# Patient Record
Sex: Male | Born: 1945
Health system: Southern US, Community
[De-identification: ages and names within clinical notes are randomized; demographics above are authoritative.]

## PROBLEM LIST (undated history)

## (undated) DIAGNOSIS — J3089 Other allergic rhinitis: Secondary | ICD-10-CM

## (undated) DIAGNOSIS — K219 Gastro-esophageal reflux disease without esophagitis: Secondary | ICD-10-CM

## (undated) DIAGNOSIS — C44519 Basal cell carcinoma of skin of other part of trunk: Secondary | ICD-10-CM

## (undated) DIAGNOSIS — N400 Enlarged prostate without lower urinary tract symptoms: Secondary | ICD-10-CM

## (undated) DIAGNOSIS — I2510 Atherosclerotic heart disease of native coronary artery without angina pectoris: Secondary | ICD-10-CM

## (undated) DIAGNOSIS — J45909 Unspecified asthma, uncomplicated: Secondary | ICD-10-CM

## (undated) DIAGNOSIS — Z87898 Personal history of other specified conditions: Secondary | ICD-10-CM

## (undated) DIAGNOSIS — E785 Hyperlipidemia, unspecified: Secondary | ICD-10-CM

## (undated) DIAGNOSIS — N5201 Erectile dysfunction due to arterial insufficiency: Secondary | ICD-10-CM

## (undated) DIAGNOSIS — M1990 Unspecified osteoarthritis, unspecified site: Secondary | ICD-10-CM

## (undated) DIAGNOSIS — I10 Essential (primary) hypertension: Secondary | ICD-10-CM

## (undated) DIAGNOSIS — E119 Type 2 diabetes mellitus without complications: Secondary | ICD-10-CM

## (undated) DIAGNOSIS — D131 Benign neoplasm of stomach: Secondary | ICD-10-CM

## (undated) DIAGNOSIS — I639 Cerebral infarction, unspecified: Secondary | ICD-10-CM

## (undated) DIAGNOSIS — K602 Anal fissure, unspecified: Secondary | ICD-10-CM

## (undated) DIAGNOSIS — C4490 Unspecified malignant neoplasm of skin, unspecified: Secondary | ICD-10-CM

## (undated) DIAGNOSIS — N486 Induration penis plastica: Secondary | ICD-10-CM

## (undated) DIAGNOSIS — D126 Benign neoplasm of colon, unspecified: Secondary | ICD-10-CM

## (undated) DIAGNOSIS — T7840XA Allergy, unspecified, initial encounter: Secondary | ICD-10-CM

## (undated) DIAGNOSIS — R351 Nocturia: Secondary | ICD-10-CM

## (undated) DIAGNOSIS — Z87442 Personal history of urinary calculi: Secondary | ICD-10-CM

## (undated) DIAGNOSIS — H8110 Benign paroxysmal vertigo, unspecified ear: Secondary | ICD-10-CM

## (undated) DIAGNOSIS — K5790 Diverticulosis of intestine, part unspecified, without perforation or abscess without bleeding: Secondary | ICD-10-CM

## (undated) HISTORY — DX: Unspecified asthma, uncomplicated: J45.909

## (undated) HISTORY — DX: Induration penis plastica: N48.6

## (undated) HISTORY — DX: Benign prostatic hyperplasia without lower urinary tract symptoms: N40.0

## (undated) HISTORY — DX: Other allergic rhinitis: J30.89

## (undated) HISTORY — PX: VASECTOMY: SHX75

## (undated) HISTORY — DX: Benign neoplasm of stomach: D13.1

## (undated) HISTORY — DX: Erectile dysfunction due to arterial insufficiency: N52.01

## (undated) HISTORY — DX: Diverticulosis of intestine, part unspecified, without perforation or abscess without bleeding: K57.90

## (undated) HISTORY — PX: HERNIA REPAIR: SHX51

## (undated) HISTORY — PX: TRIGGER FINGER RELEASE: SHX641

## (undated) HISTORY — DX: Allergy, unspecified, initial encounter: T78.40XA

## (undated) HISTORY — DX: Benign neoplasm of colon, unspecified: D12.6

## (undated) HISTORY — DX: Cerebral infarction, unspecified: I63.9

## (undated) HISTORY — DX: Hyperlipidemia, unspecified: E78.5

## (undated) HISTORY — DX: Nocturia: R35.1

## (undated) HISTORY — PX: OTHER SURGICAL HISTORY: SHX169

## (undated) HISTORY — DX: Basal cell carcinoma of skin of other part of trunk: C44.519

## (undated) HISTORY — PX: TONSILLECTOMY AND ADENOIDECTOMY: SUR1326

## (undated) HISTORY — DX: Personal history of other specified conditions: Z87.898

## (undated) HISTORY — DX: Anal fissure, unspecified: K60.2

## (undated) HISTORY — DX: Gastro-esophageal reflux disease without esophagitis: K21.9

## (undated) HISTORY — DX: Benign paroxysmal vertigo, unspecified ear: H81.10

## (undated) HISTORY — DX: Type 2 diabetes mellitus without complications: E11.9

---

## 1997-07-06 ENCOUNTER — Encounter: Admission: RE | Admit: 1997-07-06 | Discharge: 1997-10-04 | Payer: Self-pay | Admitting: Specialist

## 2001-02-05 ENCOUNTER — Emergency Department (HOSPITAL_COMMUNITY): Admission: EM | Admit: 2001-02-05 | Discharge: 2001-02-05 | Payer: Self-pay | Admitting: Emergency Medicine

## 2001-02-05 ENCOUNTER — Encounter: Payer: Self-pay | Admitting: *Deleted

## 2001-11-04 ENCOUNTER — Encounter: Payer: Self-pay | Admitting: Gastroenterology

## 2002-08-25 ENCOUNTER — Encounter: Payer: Self-pay | Admitting: Pediatrics

## 2002-08-25 ENCOUNTER — Encounter: Admission: RE | Admit: 2002-08-25 | Discharge: 2002-08-25 | Payer: Self-pay | Admitting: Pediatrics

## 2003-12-08 ENCOUNTER — Encounter: Payer: Self-pay | Admitting: Internal Medicine

## 2004-05-17 ENCOUNTER — Ambulatory Visit: Payer: Self-pay | Admitting: Internal Medicine

## 2004-05-27 ENCOUNTER — Encounter: Admission: RE | Admit: 2004-05-27 | Discharge: 2004-07-23 | Payer: Self-pay | Admitting: Internal Medicine

## 2004-09-09 ENCOUNTER — Ambulatory Visit: Payer: Self-pay | Admitting: Internal Medicine

## 2005-03-21 ENCOUNTER — Ambulatory Visit: Payer: Self-pay | Admitting: Internal Medicine

## 2005-04-18 ENCOUNTER — Ambulatory Visit: Payer: Self-pay | Admitting: Internal Medicine

## 2006-05-14 ENCOUNTER — Ambulatory Visit: Payer: Self-pay | Admitting: Internal Medicine

## 2006-05-14 DIAGNOSIS — Z87442 Personal history of urinary calculi: Secondary | ICD-10-CM

## 2006-05-15 LAB — CONVERTED CEMR LAB
ALT: 33 units/L (ref 0–40)
AST: 28 units/L (ref 0–37)
Albumin: 3.9 g/dL (ref 3.5–5.2)
Alkaline Phosphatase: 70 units/L (ref 39–117)
BUN: 16 mg/dL (ref 6–23)
Basophils Absolute: 0.1 10*3/uL (ref 0.0–0.1)
Basophils Relative: 0.9 % (ref 0.0–1.0)
Bilirubin Urine: NEGATIVE
Bilirubin, Direct: 0.2 mg/dL (ref 0.0–0.3)
CO2: 29 meq/L (ref 19–32)
Calcium: 10.1 mg/dL (ref 8.4–10.5)
Chloride: 100 meq/L (ref 96–112)
Cholesterol: 208 mg/dL (ref 0–200)
Creatinine, Ser: 1.2 mg/dL (ref 0.4–1.5)
Creatinine,U: 135.1 mg/dL
Direct LDL: 133.2 mg/dL
Eosinophils Absolute: 0.1 10*3/uL (ref 0.0–0.6)
Eosinophils Relative: 2 % (ref 0.0–5.0)
Free T4: 0.7 ng/dL (ref 0.6–1.6)
GFR calc Af Amer: 80 mL/min
GFR calc non Af Amer: 66 mL/min
Glucose, Bld: 102 mg/dL — ABNORMAL HIGH (ref 70–99)
HCT: 45.8 % (ref 39.0–52.0)
HDL: 32.5 mg/dL — ABNORMAL LOW (ref >39.0)
Hemoglobin, Urine: NEGATIVE
Hemoglobin: 16.1 g/dL (ref 13.0–17.0)
Hgb A1c MFr Bld: 5.5 % (ref 4.6–6.0)
Ketones, ur: NEGATIVE mg/dL
Leukocytes, UA: NEGATIVE
Lymphocytes Relative: 25.7 % (ref 12.0–46.0)
MCHC: 35.1 g/dL (ref 30.0–36.0)
MCV: 88.7 fL (ref 78.0–100.0)
Microalb Creat Ratio: 4.4 mg/g (ref 0.0–30.0)
Microalb, Ur: 0.6 mg/dL (ref 0.0–1.9)
Monocytes Absolute: 0.6 10*3/uL (ref 0.2–0.7)
Monocytes Relative: 7.9 % (ref 3.0–11.0)
Neutro Abs: 4.5 10*3/uL (ref 1.4–7.7)
Neutrophils Relative %: 63.5 % (ref 43.0–77.0)
Nitrite: NEGATIVE
PSA: 1.46 ng/mL (ref 0.10–4.00)
Platelets: 172 10*3/uL (ref 150–400)
Potassium: 4.6 meq/L (ref 3.5–5.1)
RBC: 5.17 M/uL (ref 4.22–5.81)
RDW: 12.1 % (ref 11.5–14.6)
Sodium: 145 meq/L (ref 135–145)
Specific Gravity, Urine: 1.015 (ref 1.000–1.03)
TSH: 3.65 microintl units/mL (ref 0.35–5.50)
Total Bilirubin: 1 mg/dL (ref 0.3–1.2)
Total CHOL/HDL Ratio: 6.4
Total Protein, Urine: NEGATIVE mg/dL
Total Protein: 6.3 g/dL (ref 6.0–8.3)
Triglycerides: 256 mg/dL (ref 0–149)
Urine Glucose: NEGATIVE mg/dL
Urobilinogen, UA: 0.2 (ref 0.0–1.0)
VLDL: 51 mg/dL — ABNORMAL HIGH (ref 0–40)
WBC: 7.1 10*3/uL (ref 4.5–10.5)
pH: 7 (ref 5.0–8.0)

## 2006-05-20 ENCOUNTER — Ambulatory Visit: Payer: Self-pay | Admitting: Internal Medicine

## 2006-06-10 ENCOUNTER — Ambulatory Visit: Payer: Self-pay | Admitting: *Deleted

## 2006-09-04 ENCOUNTER — Encounter: Payer: Self-pay | Admitting: Internal Medicine

## 2006-10-29 ENCOUNTER — Encounter: Payer: Self-pay | Admitting: Internal Medicine

## 2006-11-27 ENCOUNTER — Ambulatory Visit: Payer: Self-pay | Admitting: Internal Medicine

## 2006-12-07 LAB — CONVERTED CEMR LAB
Cholesterol: 199 mg/dL (ref 0–200)
HDL: 27.6 mg/dL — ABNORMAL LOW (ref >39.0)
Hgb A1c MFr Bld: 5.8 % (ref 4.6–6.0)
LDL Cholesterol: 138 mg/dL — ABNORMAL HIGH (ref 0–99)
Total CHOL/HDL Ratio: 7.2
Triglycerides: 169 mg/dL — ABNORMAL HIGH (ref 0–149)
VLDL: 34 mg/dL (ref 0–40)

## 2006-12-08 ENCOUNTER — Encounter (INDEPENDENT_AMBULATORY_CARE_PROVIDER_SITE_OTHER): Payer: Self-pay | Admitting: *Deleted

## 2006-12-11 ENCOUNTER — Ambulatory Visit: Payer: Self-pay | Admitting: Internal Medicine

## 2006-12-11 LAB — CONVERTED CEMR LAB
Cholesterol, target level: 200 mg/dL
HDL goal, serum: 40 mg/dL
LDL Goal: 130 mg/dL

## 2007-01-22 ENCOUNTER — Telehealth: Payer: Self-pay | Admitting: Internal Medicine

## 2007-02-19 ENCOUNTER — Ambulatory Visit: Payer: Self-pay | Admitting: Internal Medicine

## 2007-02-26 ENCOUNTER — Encounter (INDEPENDENT_AMBULATORY_CARE_PROVIDER_SITE_OTHER): Payer: Self-pay | Admitting: *Deleted

## 2007-02-26 LAB — CONVERTED CEMR LAB
ALT: 22 units/L (ref 0–53)
AST: 22 units/L (ref 0–37)
Cholesterol: 181 mg/dL (ref 0–200)
HDL: 30.2 mg/dL — ABNORMAL LOW (ref >39.0)
Hgb A1c MFr Bld: 5.5 % (ref 4.6–6.0)
LDL Cholesterol: 121 mg/dL — ABNORMAL HIGH (ref 0–99)
Total CHOL/HDL Ratio: 6
Triglycerides: 150 mg/dL — ABNORMAL HIGH (ref 0–149)
VLDL: 30 mg/dL (ref 0–40)

## 2007-03-01 ENCOUNTER — Ambulatory Visit: Payer: Self-pay | Admitting: Internal Medicine

## 2007-03-01 DIAGNOSIS — IMO0002 Reserved for concepts with insufficient information to code with codable children: Secondary | ICD-10-CM

## 2007-03-02 ENCOUNTER — Encounter (INDEPENDENT_AMBULATORY_CARE_PROVIDER_SITE_OTHER): Payer: Self-pay | Admitting: *Deleted

## 2007-03-02 LAB — CONVERTED CEMR LAB: Total CK: 181 units/L (ref 7–195)

## 2007-06-02 ENCOUNTER — Ambulatory Visit: Payer: Self-pay | Admitting: Internal Medicine

## 2007-06-08 ENCOUNTER — Encounter (INDEPENDENT_AMBULATORY_CARE_PROVIDER_SITE_OTHER): Payer: Self-pay | Admitting: *Deleted

## 2007-06-08 LAB — CONVERTED CEMR LAB
AST: 27 units/L (ref 0–37)
Bilirubin, Direct: 0.2 mg/dL (ref 0.0–0.3)
Cholesterol: 131 mg/dL (ref 0–200)
HDL: 29.8 mg/dL — ABNORMAL LOW (ref >39.0)
Total Bilirubin: 1 mg/dL (ref 0.3–1.2)
Total CHOL/HDL Ratio: 4.4
Total CK: 172 units/L (ref 7–195)
Total Protein: 6.2 g/dL (ref 6.0–8.3)
Triglycerides: 114 mg/dL (ref 0–149)

## 2007-06-16 ENCOUNTER — Ambulatory Visit: Payer: Self-pay | Admitting: Internal Medicine

## 2007-06-16 DIAGNOSIS — J45909 Unspecified asthma, uncomplicated: Secondary | ICD-10-CM

## 2007-06-16 DIAGNOSIS — I10 Essential (primary) hypertension: Secondary | ICD-10-CM

## 2007-06-16 DIAGNOSIS — E785 Hyperlipidemia, unspecified: Secondary | ICD-10-CM

## 2007-06-16 HISTORY — DX: Unspecified asthma, uncomplicated: J45.909

## 2007-07-27 ENCOUNTER — Encounter: Payer: Self-pay | Admitting: Internal Medicine

## 2007-08-03 ENCOUNTER — Ambulatory Visit: Payer: Self-pay | Admitting: Internal Medicine

## 2007-08-03 DIAGNOSIS — T887XXA Unspecified adverse effect of drug or medicament, initial encounter: Secondary | ICD-10-CM | POA: Insufficient documentation

## 2007-10-07 ENCOUNTER — Encounter: Payer: Self-pay | Admitting: Internal Medicine

## 2007-10-20 ENCOUNTER — Encounter: Payer: Self-pay | Admitting: Internal Medicine

## 2007-10-25 ENCOUNTER — Encounter (INDEPENDENT_AMBULATORY_CARE_PROVIDER_SITE_OTHER): Payer: Self-pay | Admitting: *Deleted

## 2007-10-28 ENCOUNTER — Encounter: Payer: Self-pay | Admitting: Internal Medicine

## 2007-11-10 ENCOUNTER — Encounter: Payer: Self-pay | Admitting: Internal Medicine

## 2007-11-26 ENCOUNTER — Ambulatory Visit: Payer: Self-pay | Admitting: Internal Medicine

## 2007-12-03 ENCOUNTER — Encounter (INDEPENDENT_AMBULATORY_CARE_PROVIDER_SITE_OTHER): Payer: Self-pay | Admitting: *Deleted

## 2007-12-03 LAB — CONVERTED CEMR LAB
ALT: 28 units/L (ref 0–53)
AST: 25 units/L (ref 0–37)
Albumin: 4.3 g/dL (ref 3.5–5.2)
BUN: 14 mg/dL (ref 6–23)
Basophils Relative: 0.7 % (ref 0.0–3.0)
CO2: 26 meq/L (ref 19–32)
Calcium: 9.1 mg/dL (ref 8.4–10.5)
Chloride: 108 meq/L (ref 96–112)
Cholesterol: 153 mg/dL (ref 0–200)
Creatinine, Ser: 1.1 mg/dL (ref 0.4–1.5)
Eosinophils Absolute: 0.2 10*3/uL (ref 0.0–0.7)
Eosinophils Relative: 2.8 % (ref 0.0–5.0)
GFR calc non Af Amer: 73 mL/min
HCT: 45.1 % (ref 39.0–52.0)
Hemoglobin: 15.7 g/dL (ref 13.0–17.0)
Hgb A1c MFr Bld: 5.7 % (ref 4.6–6.0)
MCHC: 34.9 g/dL (ref 30.0–36.0)
MCV: 90.6 fL (ref 78.0–100.0)
Neutro Abs: 3.5 10*3/uL (ref 1.4–7.7)
Neutrophils Relative %: 60.2 % (ref 43.0–77.0)
PSA: 1.45 ng/mL (ref 0.10–4.00)
RBC: 4.98 M/uL (ref 4.22–5.81)
VLDL: 31 mg/dL (ref 0–40)
WBC: 5.8 10*3/uL (ref 4.5–10.5)

## 2007-12-16 ENCOUNTER — Encounter: Payer: Self-pay | Admitting: Internal Medicine

## 2007-12-28 ENCOUNTER — Ambulatory Visit: Payer: Self-pay | Admitting: Internal Medicine

## 2007-12-28 DIAGNOSIS — Z85828 Personal history of other malignant neoplasm of skin: Secondary | ICD-10-CM

## 2007-12-30 ENCOUNTER — Encounter: Payer: Self-pay | Admitting: Internal Medicine

## 2008-04-07 DIAGNOSIS — D131 Benign neoplasm of stomach: Secondary | ICD-10-CM

## 2008-04-07 DIAGNOSIS — D126 Benign neoplasm of colon, unspecified: Secondary | ICD-10-CM

## 2008-04-07 HISTORY — DX: Benign neoplasm of colon, unspecified: D12.6

## 2008-04-07 HISTORY — DX: Benign neoplasm of stomach: D13.1

## 2008-06-19 ENCOUNTER — Encounter: Payer: Self-pay | Admitting: Internal Medicine

## 2008-08-25 ENCOUNTER — Encounter: Payer: Self-pay | Admitting: Internal Medicine

## 2008-08-29 ENCOUNTER — Encounter: Payer: Self-pay | Admitting: Internal Medicine

## 2008-09-26 ENCOUNTER — Telehealth (INDEPENDENT_AMBULATORY_CARE_PROVIDER_SITE_OTHER): Payer: Self-pay | Admitting: *Deleted

## 2008-10-06 ENCOUNTER — Encounter: Payer: Self-pay | Admitting: Internal Medicine

## 2008-12-20 ENCOUNTER — Telehealth (INDEPENDENT_AMBULATORY_CARE_PROVIDER_SITE_OTHER): Payer: Self-pay | Admitting: *Deleted

## 2008-12-22 ENCOUNTER — Ambulatory Visit: Payer: Self-pay | Admitting: Internal Medicine

## 2008-12-22 LAB — CONVERTED CEMR LAB
Albumin: 4.3 g/dL (ref 3.5–5.2)
BUN: 16 mg/dL (ref 6–23)
Basophils Relative: 0.4 % (ref 0.0–3.0)
CO2: 32 meq/L (ref 19–32)
Chloride: 110 meq/L (ref 96–112)
Creatinine, Ser: 1.1 mg/dL (ref 0.4–1.5)
Eosinophils Relative: 1.8 % (ref 0.0–5.0)
HCT: 46.4 % (ref 39.0–52.0)
HDL: 33.6 mg/dL — ABNORMAL LOW (ref >39.00)
Hemoglobin: 15.9 g/dL (ref 13.0–17.0)
Hgb A1c MFr Bld: 5.7 % (ref 4.6–6.5)
LDL Cholesterol: 86 mg/dL (ref 0–99)
Lymphs Abs: 1.8 10*3/uL (ref 0.7–4.0)
Monocytes Relative: 8 % (ref 3.0–12.0)
Neutro Abs: 5.4 10*3/uL (ref 1.4–7.7)
RBC: 5.04 M/uL (ref 4.22–5.81)
TSH: 2.21 microintl units/mL (ref 0.35–5.50)
Total CHOL/HDL Ratio: 4
Triglycerides: 109 mg/dL (ref 0.0–149.0)
WBC: 7.9 10*3/uL (ref 4.5–10.5)

## 2008-12-28 ENCOUNTER — Ambulatory Visit: Payer: Self-pay | Admitting: Internal Medicine

## 2008-12-29 ENCOUNTER — Telehealth: Payer: Self-pay | Admitting: Gastroenterology

## 2009-01-01 ENCOUNTER — Encounter: Payer: Self-pay | Admitting: Internal Medicine

## 2009-01-01 ENCOUNTER — Ambulatory Visit: Payer: Self-pay | Admitting: Gastroenterology

## 2009-01-01 ENCOUNTER — Ambulatory Visit: Payer: Self-pay | Admitting: Family Medicine

## 2009-01-01 DIAGNOSIS — K219 Gastro-esophageal reflux disease without esophagitis: Secondary | ICD-10-CM

## 2009-01-12 ENCOUNTER — Ambulatory Visit: Payer: Self-pay | Admitting: Gastroenterology

## 2009-01-12 ENCOUNTER — Encounter: Payer: Self-pay | Admitting: Gastroenterology

## 2009-01-17 ENCOUNTER — Encounter: Payer: Self-pay | Admitting: Gastroenterology

## 2009-01-19 ENCOUNTER — Encounter (INDEPENDENT_AMBULATORY_CARE_PROVIDER_SITE_OTHER): Payer: Self-pay | Admitting: *Deleted

## 2009-01-26 ENCOUNTER — Telehealth (INDEPENDENT_AMBULATORY_CARE_PROVIDER_SITE_OTHER): Payer: Self-pay | Admitting: *Deleted

## 2009-01-29 ENCOUNTER — Ambulatory Visit: Payer: Self-pay | Admitting: Internal Medicine

## 2009-01-29 ENCOUNTER — Telehealth (INDEPENDENT_AMBULATORY_CARE_PROVIDER_SITE_OTHER): Payer: Self-pay | Admitting: *Deleted

## 2009-01-31 ENCOUNTER — Ambulatory Visit: Payer: Self-pay | Admitting: Internal Medicine

## 2009-02-05 ENCOUNTER — Encounter (INDEPENDENT_AMBULATORY_CARE_PROVIDER_SITE_OTHER): Payer: Self-pay | Admitting: *Deleted

## 2009-02-05 LAB — CONVERTED CEMR LAB
Mumps IgG: 3.88 — ABNORMAL HIGH
Rubeola IgG: 1.94 — ABNORMAL HIGH

## 2009-03-05 ENCOUNTER — Ambulatory Visit: Payer: Self-pay | Admitting: Internal Medicine

## 2009-03-09 ENCOUNTER — Encounter: Payer: Self-pay | Admitting: Internal Medicine

## 2009-04-07 HISTORY — PX: UPPER GASTROINTESTINAL ENDOSCOPY: SHX188

## 2009-06-07 ENCOUNTER — Ambulatory Visit: Payer: Self-pay | Admitting: Internal Medicine

## 2009-10-05 ENCOUNTER — Encounter: Payer: Self-pay | Admitting: Internal Medicine

## 2010-01-25 ENCOUNTER — Encounter: Payer: Self-pay | Admitting: Internal Medicine

## 2010-03-26 ENCOUNTER — Ambulatory Visit: Payer: Self-pay | Admitting: Internal Medicine

## 2010-03-26 LAB — CONVERTED CEMR LAB
Blood in Urine, dipstick: NEGATIVE
Glucose, Urine, Semiquant: NEGATIVE
Ketones, urine, test strip: NEGATIVE
Protein, U semiquant: NEGATIVE
WBC Urine, dipstick: NEGATIVE
pH: 7.5

## 2010-04-01 LAB — CONVERTED CEMR LAB
ALT: 26 units/L (ref 0–53)
Albumin: 4.1 g/dL (ref 3.5–5.2)
Basophils Relative: 0.7 % (ref 0.0–3.0)
CO2: 28 meq/L (ref 19–32)
Chloride: 106 meq/L (ref 96–112)
Eosinophils Relative: 2 % (ref 0.0–5.0)
Glucose, Bld: 99 mg/dL (ref 70–99)
HDL: 34.7 mg/dL — ABNORMAL LOW (ref >39.00)
Lymphocytes Relative: 24.9 % (ref 12.0–46.0)
MCV: 91 fL (ref 78.0–100.0)
Monocytes Relative: 8.8 % (ref 3.0–12.0)
Neutrophils Relative %: 63.6 % (ref 43.0–77.0)
Platelets: 156 10*3/uL (ref 150.0–400.0)
Potassium: 5 meq/L (ref 3.5–5.1)
RBC: 4.84 M/uL (ref 4.22–5.81)
Sodium: 141 meq/L (ref 135–145)
TSH: 2.33 microintl units/mL (ref 0.35–5.50)
Total Bilirubin: 1 mg/dL (ref 0.3–1.2)
Total Protein: 6.4 g/dL (ref 6.0–8.3)
Triglycerides: 130 mg/dL (ref 0.0–149.0)
VLDL: 26 mg/dL (ref 0.0–40.0)
WBC: 6.8 10*3/uL (ref 4.5–10.5)

## 2010-04-26 ENCOUNTER — Encounter: Payer: Self-pay | Admitting: Internal Medicine

## 2010-04-26 ENCOUNTER — Ambulatory Visit
Admission: RE | Admit: 2010-04-26 | Discharge: 2010-04-26 | Payer: Self-pay | Source: Home / Self Care | Attending: Internal Medicine | Admitting: Internal Medicine

## 2010-05-09 NOTE — Letter (Signed)
Summary: [facility]  [facility]   Imported By: Lanelle Bal 10/15/2009 09:10:57  _____________________________________________________________________  External Attachment:    Type:   Image     Comment:   External Document

## 2010-05-09 NOTE — Assessment & Plan Note (Signed)
Summary: BOILS UNDER ARMS AND IN LOWER AREA//[facility]   Vital Signs:  Patient profile:   65 year old male Weight:      216.6 pounds Temp:     98.8 degrees F oral Resp:     16 per minute BP sitting:   128 / 86  (left arm) Cuff size:   small  Vitals Entered By: Shonna Chock (June 07, 2009 10:30 AM) CC: Patient had boils that have now disappeared, patient would like to discuss Comments REVIEWED MED LIST, PATIENT AGREED DOSE AND INSTRUCTION CORRECT    Primary Care Provider:  Marga Melnick, MD  CC:  Patient had boils that have now disappeared and patient would like to discuss.  History of Present Illness: "Boils " in axillae & @ rectal area intermittently over past 6 months, most recently 12 days ago.Described as tender  "pink & hard... purple head".Rx: Safeguard soap;Bactroban with complete resolution.Oral steroids necessary intermittently  for extrinsic respiratory issues from Dr Beaulah Dinning. No PMH of MRSA.Last A1c 5.7% in 12/2008; on no diabetic meds.  Allergies: 1)  ! Asa  Review of Systems General:  Denies chills, fever, and sweats. Derm:  Denies changes in color of skin, lesion(s), poor wound healing, and rash; No active lesions now. Endo:  Denies excessive hunger, excessive thirst, and excessive urination.  Physical Exam  General:  well-nourished,in no acute distress; alert,appropriate and cooperative throughout examination Skin:  Intact without suspicious lesions or rashes; no active infectious process Cervical Nodes:  No lymphadenopathy noted Axillary Nodes:  No palpable lymphadenopathy Psych:  memory intact for recent and remote, normally interactive, and good eye contact.     Impression & Recommendations:  Problem # 1:  CELLULITIS/ABSCESS, SITE NEC (GEX-528.8) recurrent ; R/O unmasking of DM by oral steroids with uncomplicated cellulitic infections, responsive to topical therapy The following medications were removed from the medication list:    Amoxicillin 500 Mg Caps  (Amoxicillin) .Marland Kitchen... 1 three times a day  Complete Medication List: 1)  Nexium 40 Mg Cpdr (Esomeprazole magnesium) .Marland Kitchen.. 1 by mouth qd 2)  Nasonex 50 Mcg/act Susp (Mometasone furoate) .... Prn 3)  Singulair 10 Mg Tabs (Montelukast sodium) .Marland Kitchen.. 1 by mouth qd 4)  Fexofenadine Hcl 180 Mg Tabs (Fexofenadine hcl) .Marland Kitchen.. 1 by mouth once daily prn 5)  Proventil Hfa 108 (90 Base) Mcg/act Aers (Albuterol sulfate) .Marland Kitchen.. 1-2 sprays q4 hours prn 6)  Qvar 80 Mcg/act Aers (Beclomethasone dipropionate) .... Prn 7)  Simvastatin 40 Mg Tabs (Simvastatin) .Marland Kitchen.. 1 by mouth qhs 8)  Multivitamins Tabs (Multiple vitamin) .Marland Kitchen.. 1 by mouth once daily  Patient Instructions: 1)   Draw an A1c if steroids restarted(Code: 790.29). Avoid Neosporin or Triple Antibiotic Ointment; use Bactroban  once daily as needed.

## 2010-05-09 NOTE — Letter (Signed)
Summary: [facility]   Imported By: Lanelle Bal 02/05/2010 09:10:40  _____________________________________________________________________  External Attachment:    Type:   Image     Comment:   External Document

## 2010-05-09 NOTE — Assessment & Plan Note (Signed)
Summary: CPX,LABS PRIOR/RH......Marland Kitchen   Vital Signs:  Patient profile:   65 year old male Height:      70 inches Weight:      213.6 pounds BMI:     30.76 Temp:     98.4 degrees F oral Pulse rate:   58 / minute Resp:     16 per minute BP sitting:   163 / 105  (left arm) Cuff size:   large  Vitals Entered By: Shonna Chock CMA (April 26, 2010 1:11 PM) CC: CPX and discuss labs , Heartburn, COPD follow-up   Primary Care Provider:  Marga Melnick, MD  CC:  CPX and discuss labs , Heartburn, and COPD follow-up.  History of Present Illness:    George Alvarez is here for a physical; he has been evaluated for gall baldder dysfunction in 01/2010  by a General Surgeon.     His ERD is controlled with Nexium.   He  denies acid reflux, sour taste in mouth, epigastric pain, chest pain, trouble swallowing, weight loss, and weight gain.  The patient denies the following alarm features: melena, dysphagia, hematemesis, and vomiting.  Symptoms are worse with spicy foods.      Hyperlipidemia Follow-Up: The patient denies muscle aches, flushing, itching, constipation, diarrhea, and fatigue.  The patient denies the following symptoms: exercise intolerance, dypsnea, palpitations, syncope, and pedal edema.  Compliance with medications (by patient report) has been near 100%.  Dietary compliance has been fair.  The patient reports exercising occasionally.  Adjunctive measures currently used by the patient include fiber and fish oil supplements.       RAD / EIB :  The patient denies shortness of breath, chest tightness, wheezing, cough, increased sputum, heat intolerance, and nocturnal awakening.  The patient reports no limitation in activities.  Medication use includes quick relief med weekl , mainly pre exercise  and controller med daily(QVAR). He  was seen @  Dr Beaulah Dinning' office  today.  Current Medications (verified): 1)  Nexium 40 Mg  Cpdr (Esomeprazole Magnesium) .Marland Kitchen.. 1 By Mouth Qd 2)  Nasonex 50 Mcg/act  Susp  (Mometasone Furoate) .... Prn 3)  Singulair 10 Mg  Tabs (Montelukast Sodium) .Marland Kitchen.. 1 By Mouth Qd 4)  Fexofenadine Hcl 180 Mg  Tabs (Fexofenadine Hcl) .Marland Kitchen.. 1 By Mouth Once Daily Prn 5)  Proventil Hfa 108 (90 Base) Mcg/act  Aers (Albuterol Sulfate) .Marland Kitchen.. 1-2 Sprays Q4 Hours Prn 6)  Qvar 80 Mcg/act  Aers (Beclomethasone Dipropionate) .... Prn 7)  Simvastatin 40 Mg  Tabs (Simvastatin) .Marland Kitchen.. 1 By Mouth At Bedtime 8)  Multivitamins  Tabs (Multiple Vitamin) .Marland Kitchen.. 1 By Mouth Once Daily  Allergies: 1)  ! Jonne Ply  Past History:  Past Medical History: LUMBOSACRAL RADICULOPATHY (ICD-724.4)  secondary  to Spinal Stenosis HYPERLIPIDEMIA  (ICD-272.4) NEPHROLITHIASIS, PMH  OF , X 1(ICD-V13.01), Dr Annabell Howells ASTHMA (ICD-493.90)/ EIB , Dr Beaulah Dinning Testicular cysts Skin cancer, PMH  of, basal cell & squamous cell, Dr Remonia Richter in [place], [place] of ; Trigger Finger, Dr Stanford [place], [place]; Peyronie's Disease , Dr Annabell Howells Diverticulosis GERD  Past Surgical History: Septoplasty Inguinal herniorrhaphy Vasectomy Tonsillectomy Trigger Finger injection 2010; Knee injection for torn meniscus 2010 Colonoscopy 2003  & 2011 negative  ; Epidural steroids cervical spine X2 in  2007; LS area X2 , last 2009  Family History: Father: MI @ 55  Mother: CAD, breast  cancer , Barrett's Esophagus, esophageal cancer , S/P radical surgery Siblings: none ; MGM :CAD, CVA, DM ; PGF: CAD; P uncl:  MI @ 30; M aunt: breast  cancer No FH of Colon Cancer:  Social History: Occupation: Environmental health practitioner Never Smoked Alcohol use-yes: socially  Daily Caffeine Use: 3 cups / day  Review of Systems  The patient denies anorexia, fever, vision loss, decreased hearing, hoarseness, prolonged cough, hemoptysis, hematuria, suspicious skin lesions, depression, unusual weight change, abnormal bleeding, enlarged lymph nodes, and angioedema.    Physical Exam  General:  well-nourished;alert,appropriate and cooperative throughout  examination Head:  Normocephalic and atraumatic without obvious abnormalities. Pattern  alopecia ; moustache & beard Eyes:  No corneal or conjunctival inflammation noted. Perrla. Funduscopic exam benign, without hemorrhages, exudates or papilledema.  Ears:  External ear exam shows no significant lesions or deformities.  Otoscopic examination reveals clear canals, tympanic membranes are intact bilaterally without bulging, retraction, inflammation or discharge. Hearing is grossly normal bilaterally. Nose:  External nasal examination shows no deformity or inflammation. Nasal mucosa are pink and moist without lesions or exudates. Mouth:  Oral mucosa and oropharynx without lesions or exudates.  Teeth in good repair. No pharyngeal erythema.   Neck:  No deformities, masses, or tenderness noted. Lungs:  Normal respiratory effort, chest expands symmetrically. Lungs are clear to auscultation, no crackles or wheezes. Heart:  Normal rate and regular rhythm. S1 and S2 normal without gallop, murmur, click, rub .S4 with slurring Abdomen:  Bowel sounds positive,abdomen soft and non-tender without masses, organomegaly or hernias noted. Genitalia:  Dr Annabell Howells Msk:  No deformity or scoliosis noted of thoracic or lumbar spine.   Pulses:  R and L carotid,radial,dorsalis pedis and posterior tibial pulses are full and equal bilaterally Extremities:  No clubbing, cyanosis, edema, or deformity noted with normal full range of motion of all joints.   Neurologic:  alert & oriented X3 and DTRs symmetrical and normal.   Skin:  Intact without suspicious lesions or rashes Cervical Nodes:  No lymphadenopathy noted Axillary Nodes:  No palpable lymphadenopathy Psych:  memory intact for recent and remote, normally interactive, and good eye contact.     Impression & Recommendations:  Problem # 1:  ROUTINE GENERAL MEDICAL EXAM@HEALTH  CARE FACL (ICD-V70.0)  Orders: EKG w/ Interpretation (93000)  Problem # 2:  GERD  (ICD-530.81)  His updated medication list for this problem includes:    Nexium 40 Mg Cpdr (Esomeprazole magnesium) .Marland Kitchen... 1 by mouth qd  Problem # 3:  HYPERTENSION, ESSENTIAL NOS (ICD-401.9)  Problem # 4:  HYPERLIPIDEMIA (ICD-272.4)  His updated medication list for this problem includes:    Simvastatin 40 Mg Tabs (Simvastatin) .Marland Kitchen... 1 by mouth at bedtime  Problem # 5:  ASTHMA (ICD-493.90)  His updated medication list for this problem includes:    Singulair 10 Mg Tabs (Montelukast sodium) .Marland Kitchen... 1 by mouth qd    Proventil Hfa 108 (90 Base) Mcg/act Aers (Albuterol sulfate) .Marland Kitchen... 1-2 sprays q4 hours prn    Qvar 80 Mcg/act Aers (Beclomethasone dipropionate) .Marland Kitchen... Prn  Complete Medication List: 1)  Nexium 40 Mg Cpdr (Esomeprazole magnesium) .Marland Kitchen.. 1 by mouth qd 2)  Nasonex 50 Mcg/act Susp (Mometasone furoate) .... Prn 3)  Singulair 10 Mg Tabs (Montelukast sodium) .Marland Kitchen.. 1 by mouth qd 4)  Fexofenadine Hcl 180 Mg Tabs (Fexofenadine hcl) .Marland Kitchen.. 1 by mouth once daily prn 5)  Proventil Hfa 108 (90 Base) Mcg/act Aers (Albuterol sulfate) .Marland Kitchen.. 1-2 sprays q4 hours prn 6)  Qvar 80 Mcg/act Aers (Beclomethasone dipropionate) .... Prn 7)  Simvastatin 40 Mg Tabs (Simvastatin) .Marland Kitchen.. 1 by mouth at bedtime 8)  Multivitamins Tabs (Multiple  vitamin) .Marland Kitchen.. 1 by mouth once daily  Patient Instructions: 1)  If Prilosec OTC controls ERD , change to Omeprazole 20 mg pre b'fast in place of Nexium. 2)  Avoid foods high in acid (tomatoes, citrus juices, spicy foods). Avoid eating within two hours of lying down or before exercising. Do not over eat; try smaller more frequent meals. Elevate head of bed twelve inches when sleeping.   Orders Added: 1)  Est. Patient 40-64 years [99396] 2)  EKG w/ Interpretation [93000]

## 2010-05-15 NOTE — Letter (Signed)
Summary: Cancer Screening/[facility] Risk Profile  Cancer Screening/[facility] Risk Profile   Imported By: Lanelle Bal 05/07/2010 08:59:22  _____________________________________________________________________  External Attachment:    Type:   Image     Comment:   External Document

## 2010-09-21 ENCOUNTER — Other Ambulatory Visit: Payer: Self-pay | Admitting: Internal Medicine

## 2011-01-31 ENCOUNTER — Other Ambulatory Visit: Payer: Self-pay | Admitting: Internal Medicine

## 2011-01-31 MED ORDER — SIMVASTATIN 40 MG PO TABS: 40.0000 mg | ORAL_TABLET | Freq: Every day | ORAL | Status: DC

## 2011-01-31 NOTE — Telephone Encounter (Signed)
Rx sent 

## 2011-05-09 ENCOUNTER — Ambulatory Visit (INDEPENDENT_AMBULATORY_CARE_PROVIDER_SITE_OTHER): Payer: BC Managed Care – PPO | Admitting: Internal Medicine

## 2011-05-09 ENCOUNTER — Encounter: Payer: Self-pay | Admitting: Internal Medicine

## 2011-05-09 VITALS — BP 130/88 | HR 71 | Temp 98.7°F | Resp 12 | Ht 70.0 in | Wt 220.0 lb

## 2011-05-09 DIAGNOSIS — Z0000 Encounter for general adult medical examination without abnormal findings: Secondary | ICD-10-CM

## 2011-05-09 DIAGNOSIS — E785 Hyperlipidemia, unspecified: Secondary | ICD-10-CM

## 2011-05-09 LAB — CBC WITH DIFFERENTIAL/PLATELET
Basophils Absolute: 0 10*3/uL (ref 0.0–0.1)
Basophils Relative: 0.5 % (ref 0.0–3.0)
Eosinophils Absolute: 0.1 10*3/uL (ref 0.0–0.7)
HCT: 46.3 % (ref 39.0–52.0)
Hemoglobin: 15.9 g/dL (ref 13.0–17.0)
Lymphocytes Relative: 27.5 % (ref 12.0–46.0)
Lymphs Abs: 1.8 10*3/uL (ref 0.7–4.0)
MCHC: 34.3 g/dL (ref 30.0–36.0)
MCV: 90 fl (ref 78.0–100.0)
Neutro Abs: 4.1 10*3/uL (ref 1.4–7.7)
RBC: 5.15 Mil/uL (ref 4.22–5.81)
RDW: 12.7 % (ref 11.5–14.6)

## 2011-05-09 LAB — BASIC METABOLIC PANEL
CO2: 27 mEq/L (ref 19–32)
Calcium: 9.2 mg/dL (ref 8.4–10.5)
Chloride: 103 mEq/L (ref 96–112)
Glucose, Bld: 101 mg/dL — ABNORMAL HIGH (ref 70–99)
Potassium: 4.4 mEq/L (ref 3.5–5.1)
Sodium: 138 mEq/L (ref 135–145)

## 2011-05-09 LAB — LIPID PANEL
Cholesterol: 134 mg/dL (ref 0–200)
HDL: 38.6 mg/dL — ABNORMAL LOW (ref >39.00)

## 2011-05-09 LAB — HEPATIC FUNCTION PANEL
Albumin: 4.5 g/dL (ref 3.5–5.2)
Alkaline Phosphatase: 63 U/L (ref 39–117)
Total Protein: 7 g/dL (ref 6.0–8.3)

## 2011-05-09 LAB — TSH: TSH: 1.83 u[IU]/mL (ref 0.35–5.50)

## 2011-05-09 LAB — HEMOGLOBIN A1C: Hgb A1c MFr Bld: 5.8 % (ref 4.6–6.5)

## 2011-05-09 NOTE — Patient Instructions (Signed)
Preventive Health Care: Exercise at least 30-45 minutes a day,  3-4 days a week.  Eat a low-fat diet with lots of fruits and vegetables, up to 7-9 servings per day. Consume less than 40 grams of sugar per day from foods & drinks with High Fructose Corn Sugar as # 1,2,3 or # 4 on label. Blood Pressure Goal  Ideally is an AVERAGE < 135/85. This AVERAGE should be calculated from @ least 5-7 BP readings taken @ different times of day on different days of week. You should not respond to isolated BP readings , but rather the AVERAGE for that week  Use Eucerin   twice a day  for the drying.  Use the "crossover" technique for Nasonex as discussed

## 2011-05-09 NOTE — Progress Notes (Signed)
Subjective:    Patient ID: George Alvarez, male    DOB: 1946/03/05, 66 y.o.   MRN: [id number]  HPI George Alvarez is here for a physical;acute issues include intermittent elevated BP, up to 160/100 in context of taking decongestants.      Review of Systems Patient reports no  vision/ hearing changes,anorexia, weight change, fever ,adenopathy, persistant / recurrent hoarseness, swallowing issues, chest pain,palpitations, edema,persistant / recurrent cough, hemoptysis, dyspnea(rest, exertional, paroxysmal nocturnal), gastrointestinal  bleeding (melena, rectal bleeding), abdominal pain, excessive heart burn, GU symptoms( dysuria, hematuria, pyuria, voiding/incontinence  issues) syncope, focal weakness, memory loss,numbness & tingling, skin/hair/nail changes,depression, anxiety, abnormal bruising/bleeding, or musculoskeletal symptoms/signs.   He has had epistaxis this winter; he's been applying topical antibiotic ointment.  He is also had some intermittent gait imbalance without a specific trigger or specific context..     Objective:   Physical Exam Gen.: Healthy and well-nourished in appearance. Alert, appropriate and cooperative throughout exam. Head: Normocephalic without obvious abnormalities; pattern alopecia . Beard & moustache Eyes: No corneal or conjunctival inflammation noted. Pupils equal round reactive to light and accommodation. Fundal exam is benign without hemorrhages, exudate, papilledema. Extraocular motion intact. Vision grossly normal with lenses.Minor ptosis OS Ears: External  ear exam reveals no significant lesions or deformities. Canals clear .TMs normal. Hearing is grossly normal bilaterally. Nose: External nasal exam reveals no deformity or inflammation. Nasal mucosa are dry. No lesions or exudates noted.   Mouth: Oral mucosa and oropharynx reveal no lesions or exudates. Teeth in good repair. Neck: No deformities, masses, or tenderness noted. Range of motion & Thyroid  normal. Lungs: Normal respiratory effort; chest expands symmetrically. Lungs are clear to auscultation without rales, wheezes, or increased work of breathing. Heart: Normal rate and rhythm. Normal S1 and S2. No gallop, click, or rub. No  murmur. Abdomen: Bowel sounds normal; abdomen soft and nontender. No masses, organomegaly or hernias noted. Genitalia/ DRE: Dr Annabell Howells, Urologist .                                                                                   Musculoskeletal/extremities: No deformity or scoliosis noted of  the thoracic or lumbar spine; R parasternal muscles > L. No clubbing, cyanosis, edema, or deformity noted. Range of motion  normal .Tone & strength  normal.Joints normal. Nail health  good. Vascular: Carotid, radial artery, dorsalis pedis and  posterior tibial pulses are full and equal. No bruits present. Neurologic: Alert and oriented x3. Deep tendon reflexes symmetrical and normal.          Skin: Intact without suspicious lesions or rashes. Multiple keratotic lesions Lymph: No cervical, axillary, or inguinal lymphadenopathy present. Psych: Mood and affect are normal. Normally interactive  Assessment & Plan:  #1 comprehensive physical exam; no acute findings #2 see Problem List with Assessments & Recommendations  #3 epistaxis due to septal drying. Topical moisturizing agent recommended ;use of a crossover technique with his Nasonex was discussed.  #4 intermittent elevated blood pressure; see recommendations  #5 intermittent slight balance issue; normal neurologic exam. This may be related to #4 Plan: see Orders

## 2011-05-28 ENCOUNTER — Telehealth: Payer: Self-pay

## 2011-05-28 NOTE — Telephone Encounter (Signed)
Patient called c/o he never received labs results from about 3 weeks ago, I informed patient labs were mailed and verified address. Patient states address is correct, I will re-mail labs. Patient ok'd

## 2011-08-04 ENCOUNTER — Telehealth: Payer: Self-pay | Admitting: Internal Medicine

## 2011-08-04 MED ORDER — SIMVASTATIN 40 MG PO TABS: 40.0000 mg | ORAL_TABLET | Freq: Every day | ORAL | Status: DC

## 2011-08-04 NOTE — Telephone Encounter (Signed)
Refill: Simvastatin 40mg #90.  °

## 2011-08-04 NOTE — Telephone Encounter (Signed)
RX sent

## 2011-12-03 ENCOUNTER — Telehealth: Payer: Self-pay | Admitting: Internal Medicine

## 2011-12-03 NOTE — Telephone Encounter (Signed)
Best Callback Phone Number: [phone] George Alvarez AT [phone] George Alvarez states he works out of state in [place]. Was in [place] on 8/23 to see allergist and asthma provider -George Alvarez. States Blood pressure at that time was 164/104. States his average Blood pressure over last month is 171/100.  States George Alvarez was concerned about blood pressure and advised George Alvarez to notify his primary careprovider. George Alvarez called George Alvarez at home to see if he had notified George Alvarez. Has had intermittent chset pain and palpitation lasting a few seconds" daily x a few days."( Per Hypertension protocol has see provider within 72 hrs disposition due to multiple elevated blood pressure readings  without other symptoms.) George Alvarez is in [place].  George Alvarez states he is scheduled to be seen by George Alvarez on 01/09/12. Asking can a low dose blood pressure medication be called in to [phone], [employer]?Marland Kitchen Is willing to go to Urgent Care or ED in [place] if recommended. Care advice given.

## 2011-12-03 NOTE — Telephone Encounter (Signed)
With the intermittent chest pain & palpitations; UC or ED visit indicated. To simply treat the BP witrh these symptoms could place  his health @ risk

## 2011-12-03 NOTE — Telephone Encounter (Signed)
Dr.Hopper please advise, I would say this patient needs to be seen in [place] at Urgent Care or Establish with a primary care doctor there for when he is there.

## 2011-12-03 NOTE — Telephone Encounter (Signed)
Patient aware of MD's advice and agreed to be seen at Urgent Care or ER. Patient informed to get copies of any testing performed

## 2012-01-08 ENCOUNTER — Ambulatory Visit (INDEPENDENT_AMBULATORY_CARE_PROVIDER_SITE_OTHER): Payer: BC Managed Care – PPO | Admitting: Internal Medicine

## 2012-01-08 ENCOUNTER — Encounter: Payer: Self-pay | Admitting: Internal Medicine

## 2012-01-08 VITALS — BP 114/80 | HR 79 | Temp 98.4°F | Wt 215.0 lb

## 2012-01-08 DIAGNOSIS — I10 Essential (primary) hypertension: Secondary | ICD-10-CM

## 2012-01-08 DIAGNOSIS — Z8249 Family history of ischemic heart disease and other diseases of the circulatory system: Secondary | ICD-10-CM

## 2012-01-08 MED ORDER — METOPROLOL TARTRATE 25 MG PO TABS: ORAL_TABLET | ORAL | Status: DC

## 2012-01-08 MED ORDER — HYDROCHLOROTHIAZIDE 12.5 MG PO CAPS: 12.5000 mg | ORAL_CAPSULE | Freq: Every day | ORAL | Status: DC

## 2012-01-08 NOTE — Assessment & Plan Note (Addendum)
There's been a dramatic improvement in his blood pressure. I would recommend taking the metoprolol one half pill twice a day and decreasing HCTZ to 12.5 mg a day with continued monitor. If additional blood pressure control if needed; I would recommend an angiotensin receptor blocker be considered. The vasodilating calcium channel blockers must be avoided because of the edema as well as nonselective beta blockers because of his asthma. Although his EKG is normal and he has no symptoms; with his family history of premature coronary disease, a stress test is appropriate

## 2012-01-08 NOTE — Patient Instructions (Addendum)
Blood Pressure Goal  Ideally is an AVERAGE < 135/85. This AVERAGE should be calculated from @ least 5-7 BP readings taken @ different times of day on different days of week. You should not respond to isolated BP readings , but rather the AVERAGE for that week. Please do not take the metoprolol the morning of your stress test   If you activate My Chart; the results can be released to you as soon as they populate from the lab. If you choose not to use this program; the labs have to be reviewed, copied & mailed   causing a delay in getting the results to you.

## 2012-01-08 NOTE — Progress Notes (Signed)
Subjective:    Patient ID: George Alvarez, male    DOB: 1945-06-29, 66 y.o.   MRN: [id number]  HPI His blood pressure had been significantly elevated for several months prior to an office visit 11/28/11 with his Allergist. He describes blood pressures up to 170/100 prior to that allergy appointment his blood pressure was 164/104.  He has been working out of town in [place] and unable to return for visits. He contacted our office with these blood pressure readings and was encouraged to go to a local urgent care. This was done in 12/17/11. Those records were reviewed; his creatinine was found to be 1.42; EKG was normal. His blood pressure was found to be 164/112  Hydrochlorothiazide 25 mg was initiated with partial response. Nifedipine 30 mg was added but associated with dramatic lower extremity edema as well as rash.  He was changed to low-dose beta blocker with significant response his blood pressure. Since he's been on the diuretic and low-dose beta blocker; blood pressures range from 154/95 high initially to as low as 111/71.    Review of Systems He denies chest pain, palpitations, exertional dyspnea, claudication, or edema since the CCB was discontinued .  He denies excess salt intake; he is a low-carb diet.He does walk > 3 miles on his job.  Significantly there is premature coronary disease in his family; past medical history and family history was updated.  His urologist stated that the digital rectal exam was negative; but his PSA has increased significantly to 2.74. He is recommending rechecking this in 3 months     Objective:   Physical Exam Gen.:  well-nourished in appearance. Alert, appropriate and cooperative throughout exam.  Eyes: No corneal or conjunctival inflammation noted. Pupils equal round reactive to light and accommodation. Fundal exam is benign without hemorrhages, exudate, papilledema Neck: No deformities, masses, or tenderness noted.  Thyroid normal. Lungs: Normal  respiratory effort; chest expands symmetrically. Lungs are clear to auscultation without rales, wheezes, or increased work of breathing. Heart: Normal rate and rhythm. Normal S1 and S2. No gallop, click, or rub.S4 w/o murmur. Abdomen: Bowel sounds normal; abdomen soft and nontender. No masses, organomegaly or hernias noted. Genitalia: Dr Annabell Howells .                                                                                   Musculoskeletal/extremities:  No clubbing, cyanosis, edema, or deformity noted. Tone & strength  normal.Joints normal. Nail health  good. Vascular: Carotid, radial artery, dorsalis pedis and  posterior tibial pulses are full and equal. No bruits present. Neurologic: Alert and oriented x3.          Skin: Intact without suspicious lesions or rashes. Lymph: No cervical, axillary lymphadenopathy present. Psych: Mood and affect are normal. Normally interactive  Assessment & Plan:

## 2012-01-28 ENCOUNTER — Encounter: Payer: BC Managed Care – PPO | Admitting: Physician Assistant

## 2012-02-05 ENCOUNTER — Encounter: Payer: Self-pay | Admitting: *Deleted

## 2012-02-05 ENCOUNTER — Ambulatory Visit (INDEPENDENT_AMBULATORY_CARE_PROVIDER_SITE_OTHER): Payer: BC Managed Care – PPO | Admitting: Physician Assistant

## 2012-02-05 ENCOUNTER — Encounter: Payer: Self-pay | Admitting: Physician Assistant

## 2012-02-05 VITALS — BP 154/60 | HR 76

## 2012-02-05 DIAGNOSIS — R079 Chest pain, unspecified: Secondary | ICD-10-CM

## 2012-02-05 DIAGNOSIS — I10 Essential (primary) hypertension: Secondary | ICD-10-CM

## 2012-02-05 DIAGNOSIS — R9439 Abnormal result of other cardiovascular function study: Secondary | ICD-10-CM

## 2012-02-05 DIAGNOSIS — Z8249 Family history of ischemic heart disease and other diseases of the circulatory system: Secondary | ICD-10-CM

## 2012-02-05 LAB — BASIC METABOLIC PANEL
BUN: 16 mg/dL (ref 6–23)
Calcium: 9 mg/dL (ref 8.4–10.5)
Creatinine, Ser: 1.1 mg/dL (ref 0.4–1.5)
GFR: 68.75 mL/min (ref >60.00)
Glucose, Bld: 111 mg/dL — ABNORMAL HIGH (ref 70–99)
Potassium: 3.9 mEq/L (ref 3.5–5.1)

## 2012-02-05 LAB — CBC WITH DIFFERENTIAL/PLATELET
Basophils Absolute: 0 10*3/uL (ref 0.0–0.1)
Eosinophils Relative: 1.7 % (ref 0.0–5.0)
Lymphocytes Relative: 27.3 % (ref 12.0–46.0)
Lymphs Abs: 1.8 10*3/uL (ref 0.7–4.0)
Monocytes Relative: 8.1 % (ref 3.0–12.0)
Neutrophils Relative %: 62.5 % (ref 43.0–77.0)
Platelets: 164 10*3/uL (ref 150.0–400.0)
RDW: 13 % (ref 11.5–14.6)
WBC: 6.7 10*3/uL (ref 4.5–10.5)

## 2012-02-05 LAB — PROTIME-INR
INR: 1 ratio (ref 0.8–1.0)
Prothrombin Time: 10.4 s (ref 10.2–12.4)

## 2012-02-05 MED ORDER — NITROGLYCERIN 0.4 MG SL SUBL: 0.4000 mg | SUBLINGUAL_TABLET | SUBLINGUAL | Status: DC | PRN

## 2012-02-05 NOTE — Patient Instructions (Addendum)
Your physician has requested that you have a LEFT cardiac catheterization 02/06/12 WITH DR. Clifton James @ 9:30 AM. Cardiac catheterization is used to diagnose and/or treat various heart conditions. Doctors may recommend this procedure for a number of different reasons. The most common reason is to evaluate chest pain. Chest pain can be a symptom of coronary artery disease (CAD), and cardiac catheterization can show whether plaque is narrowing or blocking your heart's arteries. This procedure is also used to evaluate the valves, as well as measure the blood flow and oxygen levels in different parts of your heart. For further information please visit [email or web address] Please follow instruction sheet, as given.  Your physician recommends that you return for lab work in: STAT PRE CATH LABS BMET, CBC W/DIFF, PT/INR

## 2012-02-05 NOTE — Addendum Note (Signed)
Addended byAlben Spittle, Lorin Picket T on: 02/05/2012 01:37 PM   Modules accepted: Orders

## 2012-02-05 NOTE — H&P (Signed)
History and Physical  Date:  02/05/2012   Name:  George Alvarez   DOB:  Feb 09, 1946   MRN:  [id number]  PCP:  Marga Melnick, MD  Primary Cardiologist:  Dr. Verne Carrow (new) Primary Electrophysiologist:  None    History of Present Illness: George Alvarez is a 66 y.o. male who presents to the office today for an ETT.  He is referred by his PCP.  He has no hx of CAD.  He does have a hx of HTN, HL, asthma and skin cancer.  He is a non-smoker.  He has a significant FHx of CAD.  Father died with MI in 7s and Mother had MI in her 70s.  He recently was dx with HTN.  Medications have been adjusted.  He has a hx of occasional, atypical chest pain.  Describes as tightness.  No pain in 2 mos.  No exertional symptoms.  No dyspnea.  No syncope.  ETT today is abnormal and suggestive of obstructive CAD. He has been seen as a new patient by Dr. Verne Carrow today and we have arranged cardiac cath.    Exercise Treadmill Test  Pre-Exercise Testing Evaluation Rhythm: normal sinus  Rate: 76   PR:  .17 QRS:  .09  QT:  .36 QTc: .40     Test  Exercise Tolerance Test Ordering MD: Marga Melnick , MD  Interpreting MD: Tereso Newcomer PA-C  Unique Test No: 1  Treadmill:  1  Indication for ETT: HTN  Contraindication to ETT: No   Stress Modality: exercise - treadmill  Cardiac Imaging Performed: non   Protocol: standard Bruce - maximal  Max BP:  215/75  Max MPHR (bpm):  157 85% MPR (bpm):  133  MPHR obtained (bpm):  141 % MPHR obtained:  88%  Reached 85% MPHR (min:sec):  6:13 Total Exercise Time (min-sec):  7:00  Workload in METS:  8.1 Borg Scale: 15  Reason ETT Terminated:  desired heart rate attained    ST Segment Analysis At Rest: non-specific ST segment slurring With Exercise: significant ischemic ST depression  Other Information Arrhythmia:  No Angina during ETT:  absent (0) Quality of ETT:  diagnostic  ETT Interpretation:  abnormal - evidence of ST depression consistent with  ischemia  Comments: Fair exercise tolerance. No chest pain. Hypertensive BP response to exercise. Inf-Lat ST depression consistent with ischemia that persisted in recovery for several minutes.  Recommendations: D/w Dr. Sherryl Manges (DOD) and Dr. Verne Carrow. ETT suggestive of obstructive CAD. Dr. Verne Carrow also saw the patient. Patient will be set up for cardiac cath tomorrow with Dr. Verne Carrow. Signed,  Tereso Newcomer, PA-C  1:20 PM 02/05/2012     Past Medical History  Diagnosis Date  . Hyperlipidemia   . GERD (gastroesophageal reflux disease)   . Perennial allergic rhinitis   . Asthma     post infectious; envirronmental triggers    Past Surgical History  Procedure Date  . Septoplasty   . Hernia repair   . Vasectomy   . Tonsillectomy and adenoidectomy   . Trigger finger release   . Injection knee   . Epidural steroids 2007, 2009    X 2 @ cervical &, lumbar)  . Colonoscopy 2003, 2011    negative  . Upper gastrointestinal endoscopy 2011    gastric polyps     Current Outpatient Prescriptions  Medication Sig Dispense Refill  . albuterol (PROVENTIL HFA;VENTOLIN HFA) 108 (90 BASE) MCG/ACT inhaler Inhale 2 puffs into the lungs as  needed.      . beclomethasone (QVAR) 80 MCG/ACT inhaler Inhale 1 puff into the lungs as needed.      . Chlorpheniramine Maleate ([employer] ALLERGY PO) Take by mouth.      . esomeprazole (NEXIUM) 40 MG capsule Take 40 mg by mouth daily before breakfast.      . hydrochlorothiazide (MICROZIDE) 12.5 MG capsule Take 1 capsule (12.5 mg total) by mouth daily.  90 capsule  1  . metoprolol tartrate (LOPRESSOR) 25 MG tablet 1/2 bid  90 tablet  1  . mometasone (NASONEX) 50 MCG/ACT nasal spray Place 2 sprays into the nose daily.      . montelukast (SINGULAIR) 10 MG tablet Take 10 mg by mouth at bedtime.      . nitroGLYCERIN (NITROSTAT) 0.4 MG SL tablet Place 1 tablet (0.4 mg total) under the tongue every 5 (five) minutes as needed for  chest pain.  25 tablet  3  . simvastatin (ZOCOR) 40 MG tablet Take 1 tablet (40 mg total) by mouth at bedtime.  90 tablet  1    Allergies: Allergies  Allergen Reactions  . Aspirin     Angioedema in 1980s  . Nifedipine     edema    Social History:  The patient  reports that he has never smoked. He does not have any smokeless tobacco history on file. He reports that he drinks alcohol. He reports that he does not use illicit drugs.   Family History:  The patient's family history includes Breast cancer in his maternal aunt, maternal grandmother, and mother; Dementia in his maternal grandmother; Diabetes in his maternal grandmother; Esophageal cancer in his mother; Heart attack in his maternal grandmother; Heart attack (age of onset:48) in his father; Heart attack (age of onset:52) in his paternal uncle; Heart disease in his mother; and Stroke in his maternal grandmother.   ROS:  Please see the history of present illness.    All other systems reviewed and negative.   PHYSICAL EXAM: VS:  BP 154/60  Pulse 76 Well nourished, well developed, in no acute distress HEENT: normal Neck: no JVD at 90 degrees Vascular: no carotid bruits Cardiac:  normal S1, S2; RRR; no murmur Lungs:  clear to auscultation bilaterally, no wheezing, rhonchi or rales Abd: soft, nontender Ext: no edema Skin: warm and dry Neuro:  CNs 2-12 intact, no focal abnormalities noted      ASSESSMENT AND PLAN:  1. Chest Pain:   As noted, his stress test is abnormal.  It is suggestive of obstructive CAD.  He has significant risk factors.  Continue beta blocker and statin. I will give him a Rx for NTG.  He reports a ? Hx of allergy to ASA.  He tells me he has been able to take NSAIDs.  Also has taken Excedrin in the past without problems.  His chart lists angioedema as the reaction to ASA.  I will not start ASA tonight.  He was seen by Dr. Verne Carrow today as well.  We have recommended cardiac catheterization.  Risks  and benefits of cardiac catheterization have been discussed with the patient.  These include bleeding, infection, kidney damage, stroke, heart attack, death.  The patient understands these risks and is willing to proceed.  I will give him Plavix pre-cath tomorrow.  If he has obstructive CAD, will need to decide if he can take ASA or not.   2. Hypertension:  Borderline control.  Continue to monitor.  3. Hyperlipidemia:  Continue statin.  Signed, Tereso Newcomer, PA-C  1:20 PM 02/05/2012

## 2012-02-05 NOTE — Progress Notes (Addendum)
[address] [place], [place]  [address] Phone: [phone], Fax:  [phone]  Date:  02/05/2012   Name:  George Alvarez   DOB:  1945-05-31   MRN:  [id number]  PCP:  Marga Melnick, MD  Primary Cardiologist:  Dr. Verne Carrow (new) Primary Electrophysiologist:  None    History of Present Illness: George Alvarez is a 66 y.o. male who presents to the office today for an ETT.  He is referred by his PCP.  He has no hx of CAD.  He does have a hx of HTN, HL, asthma and skin cancer.  He is a non-smoker.  He has a significant FHx of CAD.  Father died with MI in 19s and Mother had MI in her 58s.  He recently was dx with HTN.  Medications have been adjusted.  He has a hx of occasional, atypical chest pain.  Describes as tightness.  No pain in 2 mos.  No exertional symptoms.  No dyspnea.  No syncope.  ETT today is abnormal and suggestive of obstructive CAD. He has been seen as a new patient by Dr. Verne Carrow today and we have arranged cardiac cath.    Exercise Treadmill Test  Pre-Exercise Testing Evaluation Rhythm: normal sinus  Rate: 76   PR:  .17 QRS:  .09  QT:  .36 QTc: .40     Test  Exercise Tolerance Test Ordering MD: Marga Melnick , MD  Interpreting MD: Tereso Newcomer PA-C  Unique Test No: 1  Treadmill:  1  Indication for ETT: HTN  Contraindication to ETT: No   Stress Modality: exercise - treadmill  Cardiac Imaging Performed: non   Protocol: standard Bruce - maximal  Max BP:  215/75  Max MPHR (bpm):  157 85% MPR (bpm):  133  MPHR obtained (bpm):  141 % MPHR obtained:  88%  Reached 85% MPHR (min:sec):  6:13 Total Exercise Time (min-sec):  7:00  Workload in METS:  8.1 Borg Scale: 15  Reason ETT Terminated:  desired heart rate attained    ST Segment Analysis At Rest: non-specific ST segment slurring With Exercise: significant ischemic ST depression  Other Information Arrhythmia:  No Angina during ETT:  absent (0) Quality of ETT:   diagnostic  ETT Interpretation:  abnormal - evidence of ST depression consistent with ischemia  Comments: Fair exercise tolerance. No chest pain. Hypertensive BP response to exercise. Inf-Lat ST depression consistent with ischemia that persisted in recovery for several minutes.  Recommendations: D/w Dr. Sherryl Manges (DOD) and Dr. Verne Carrow. ETT suggestive of obstructive CAD. Dr. Verne Carrow also saw the patient. Patient will be set up for cardiac cath tomorrow with Dr. Verne Carrow. Signed,  Tereso Newcomer, PA-C  1:20 PM 02/05/2012     Past Medical History  Diagnosis Date  . Hyperlipidemia   . GERD (gastroesophageal reflux disease)   . Perennial allergic rhinitis   . Asthma     post infectious; envirronmental triggers    Past Surgical History  Procedure Date  . Septoplasty   . Hernia repair   . Vasectomy   . Tonsillectomy and adenoidectomy   . Trigger finger release   . Injection knee   . Epidural steroids 2007, 2009    X 2 @ cervical &, lumbar)  . Colonoscopy 2003, 2011    negative  . Upper gastrointestinal endoscopy 2011    gastric polyps     Current Outpatient Prescriptions  Medication Sig Dispense Refill  . albuterol (PROVENTIL  HFA;VENTOLIN HFA) 108 (90 BASE) MCG/ACT inhaler Inhale 2 puffs into the lungs as needed.      . beclomethasone (QVAR) 80 MCG/ACT inhaler Inhale 1 puff into the lungs as needed.      . Chlorpheniramine Maleate ([employer] ALLERGY PO) Take by mouth.      . esomeprazole (NEXIUM) 40 MG capsule Take 40 mg by mouth daily before breakfast.      . hydrochlorothiazide (MICROZIDE) 12.5 MG capsule Take 1 capsule (12.5 mg total) by mouth daily.  90 capsule  1  . metoprolol tartrate (LOPRESSOR) 25 MG tablet 1/2 bid  90 tablet  1  . mometasone (NASONEX) 50 MCG/ACT nasal spray Place 2 sprays into the nose daily.      . montelukast (SINGULAIR) 10 MG tablet Take 10 mg by mouth at bedtime.      . nitroGLYCERIN (NITROSTAT) 0.4 MG SL  tablet Place 1 tablet (0.4 mg total) under the tongue every 5 (five) minutes as needed for chest pain.  25 tablet  3  . simvastatin (ZOCOR) 40 MG tablet Take 1 tablet (40 mg total) by mouth at bedtime.  90 tablet  1    Allergies: Allergies  Allergen Reactions  . Aspirin     Angioedema in 1980s  . Nifedipine     edema    Social History:  The patient  reports that he has never smoked. He does not have any smokeless tobacco history on file. He reports that he drinks alcohol. He reports that he does not use illicit drugs.   Family History:  The patient's family history includes Breast cancer in his maternal aunt, maternal grandmother, and mother; Dementia in his maternal grandmother; Diabetes in his maternal grandmother; Esophageal cancer in his mother; Heart attack in his maternal grandmother; Heart attack (age of onset:48) in his father; Heart attack (age of onset:52) in his paternal uncle; Heart disease in his mother; and Stroke in his maternal grandmother.   ROS:  Please see the history of present illness.    All other systems reviewed and negative.   PHYSICAL EXAM: VS:  BP 154/60  Pulse 76 Well nourished, well developed, in no acute distress HEENT: normal Neck: no JVD at 90 degrees Vascular: no carotid bruits Cardiac:  normal S1, S2; RRR; no murmur Lungs:  clear to auscultation bilaterally, no wheezing, rhonchi or rales Abd: soft, nontender Ext: no edema Skin: warm and dry Neuro:  CNs 2-12 intact, no focal abnormalities noted      ASSESSMENT AND PLAN:  1. Chest Pain:   As noted, his stress test is abnormal.  It is suggestive of obstructive CAD.  He has significant risk factors.  Continue beta blocker and statin. I will give him a Rx for NTG.  He reports a ? Hx of allergy to ASA.  He tells me he has been able to take NSAIDs.  Also has taken Excedrin in the past without problems.  His chart lists angioedema as the reaction to ASA.  I will not start ASA tonight.  He was seen by Dr.  Verne Carrow today as well.  We have recommended cardiac catheterization.  Risks and benefits of cardiac catheterization have been discussed with the patient.  These include bleeding, infection, kidney damage, stroke, heart attack, death.  The patient understands these risks and is willing to proceed.  I will give him Plavix pre-cath tomorrow.  If he has obstructive CAD, will need to decide if he can take ASA or not.   2. Hypertension:  Borderline control.  Continue to monitor.  3. Hyperlipidemia:  Continue statin.    Signed, Tereso Newcomer, PA-C  1:20 PM 02/05/2012     I have personally seen and examined this patient with Tereso Newcomer, PA-C. I agree with the assessment and plan as outlined above. We will plan cardiac cath tomorrow morning to exclude obstructive CAD. Risks and benefits reviewed with pt. He agrees to proceed. I have seen him with Mr. Alben Spittle on 02/05/12 at 12:00pm.   Alvarez,George 7:24 AM 02/06/2012

## 2012-02-05 NOTE — Procedures (Signed)
George Alvarez is a 66 y.o. male referred by PCP for ETT.  He has a hx of HTN, HL, asthma and skin cancer.  He is a non-smoker.  He has a significant FHx of CAD.  Father with MI in 23s and Mother in her 82s.  He notes occasional chest pressure with stress.  No exertional symptoms.  No chest pain in 2 mos.  No DOE, syncope.  BP medication started recently.  Exam unremarkable.  Exercise Treadmill Test  Pre-Exercise Testing Evaluation Rhythm: normal sinus  Rate: 76   PR:  .17 QRS:  .09  QT:  .36 QTc: .40     Test  Exercise Tolerance Test Ordering MD: Marga Melnick , MD  Interpreting MD: Tereso Newcomer PA-C  Unique Test No: 1  Treadmill:  1  Indication for ETT: HTN  Contraindication to ETT: No   Stress Modality: exercise - treadmill  Cardiac Imaging Performed: non   Protocol: standard Bruce - maximal  Max BP:  215/75  Max MPHR (bpm):  157 85% MPR (bpm):  133  MPHR obtained (bpm):  141 % MPHR obtained:  88%  Reached 85% MPHR (min:sec):  6:13 Total Exercise Time (min-sec):  7:00  Workload in METS:  8.1 Borg Scale: 15  Reason ETT Terminated:  desired heart rate attained    ST Segment Analysis At Rest: non-specific ST segment slurring With Exercise: significant ischemic ST depression  Other Information Arrhythmia:  No Angina during ETT:  absent (0) Quality of ETT:  diagnostic  ETT Interpretation:  abnormal - evidence of ST depression consistent with ischemia  Comments: Fair exercise tolerance. No chest pain. Hypertensive BP response to exercise. Inf-Lat ST depression consistent with ischemia that persisted in recovery for several minutes.  Recommendations: D/w Dr. Sherryl Manges (DOD) and Dr. Verne Carrow. ETT suggestive of obstructive CAD. Dr. Verne Carrow also saw the patient. Patient will be set up for cardiac cath tomorrow with Dr. Verne Carrow. Signed,  Tereso Newcomer, PA-C  1:20 PM 02/05/2012

## 2012-02-06 ENCOUNTER — Encounter (HOSPITAL_COMMUNITY)
Admission: RE | Disposition: A | Discharge status: Home / Self Care | Payer: Self-pay | Source: Ambulatory Visit | Attending: Cardiovascular Disease

## 2012-02-06 ENCOUNTER — Encounter (HOSPITAL_COMMUNITY): Payer: Self-pay | Admitting: General Practice

## 2012-02-06 ENCOUNTER — Telehealth: Payer: Self-pay | Admitting: *Deleted

## 2012-02-06 ENCOUNTER — Ambulatory Visit (HOSPITAL_COMMUNITY)
Admission: RE | Admit: 2012-02-06 | Discharge: 2012-02-07 | Disposition: A | Discharge status: Home / Self Care | Payer: BC Managed Care – PPO | Source: Ambulatory Visit | Attending: Cardiovascular Disease | Admitting: Cardiovascular Disease

## 2012-02-06 ENCOUNTER — Telehealth: Payer: Self-pay | Admitting: Internal Medicine

## 2012-02-06 DIAGNOSIS — I2510 Atherosclerotic heart disease of native coronary artery without angina pectoris: Secondary | ICD-10-CM

## 2012-02-06 DIAGNOSIS — E785 Hyperlipidemia, unspecified: Secondary | ICD-10-CM | POA: Diagnosis present

## 2012-02-06 DIAGNOSIS — Z8249 Family history of ischemic heart disease and other diseases of the circulatory system: Secondary | ICD-10-CM

## 2012-02-06 DIAGNOSIS — I10 Essential (primary) hypertension: Secondary | ICD-10-CM | POA: Diagnosis present

## 2012-02-06 DIAGNOSIS — I200 Unstable angina: Secondary | ICD-10-CM

## 2012-02-06 DIAGNOSIS — R9439 Abnormal result of other cardiovascular function study: Secondary | ICD-10-CM

## 2012-02-06 DIAGNOSIS — J45909 Unspecified asthma, uncomplicated: Secondary | ICD-10-CM | POA: Diagnosis present

## 2012-02-06 DIAGNOSIS — R079 Chest pain, unspecified: Secondary | ICD-10-CM

## 2012-02-06 DIAGNOSIS — K219 Gastro-esophageal reflux disease without esophagitis: Secondary | ICD-10-CM | POA: Diagnosis present

## 2012-02-06 HISTORY — DX: Unspecified malignant neoplasm of skin, unspecified: C44.90

## 2012-02-06 HISTORY — PX: INTRAVASCULAR ULTRASOUND: SHX5452

## 2012-02-06 HISTORY — DX: Essential (primary) hypertension: I10

## 2012-02-06 HISTORY — PX: CORONARY ANGIOPLASTY WITH STENT PLACEMENT: SHX49

## 2012-02-06 HISTORY — PX: LEFT HEART CATHETERIZATION WITH CORONARY ANGIOGRAM: SHX5451

## 2012-02-06 HISTORY — DX: Atherosclerotic heart disease of native coronary artery without angina pectoris: I25.10

## 2012-02-06 HISTORY — PX: PERCUTANEOUS CORONARY STENT INTERVENTION (PCI-S): SHX5485

## 2012-02-06 SURGERY — LEFT HEART CATHETERIZATION WITH CORONARY ANGIOGRAM
Anesthesia: LOCAL

## 2012-02-06 MED ORDER — OXYCODONE-ACETAMINOPHEN 5-325 MG PO TABS: 1.0000 | ORAL_TABLET | ORAL | Status: DC | PRN

## 2012-02-06 MED ORDER — PANTOPRAZOLE SODIUM 40 MG PO TBEC
40.0000 mg | DELAYED_RELEASE_TABLET | Freq: Every day | ORAL | Status: DC
  Administered 2012-02-06: 18:00:00 40 mg via ORAL
  Filled 2012-02-06 (×2): qty 1

## 2012-02-06 MED ORDER — HYDROCHLOROTHIAZIDE 12.5 MG PO CAPS
12.5000 mg | ORAL_CAPSULE | Freq: Every day | ORAL | Status: DC
  Filled 2012-02-06 (×2): qty 1

## 2012-02-06 MED ORDER — NITROGLYCERIN 0.4 MG SL SUBL: 0.4000 mg | SUBLINGUAL_TABLET | SUBLINGUAL | Status: DC | PRN

## 2012-02-06 MED ORDER — HEPARIN (PORCINE) IN NACL 2-0.9 UNIT/ML-% IJ SOLN
INTRAMUSCULAR | Status: AC
  Filled 2012-02-06: qty 1500

## 2012-02-06 MED ORDER — MIDAZOLAM HCL 2 MG/2ML IJ SOLN
INTRAMUSCULAR | Status: AC
  Filled 2012-02-06: qty 2

## 2012-02-06 MED ORDER — NITROGLYCERIN IN D5W 200-5 MCG/ML-% IV SOLN
INTRAVENOUS | Status: AC
  Filled 2012-02-06: qty 250

## 2012-02-06 MED ORDER — VERAPAMIL HCL 2.5 MG/ML IV SOLN
INTRAVENOUS | Status: AC
  Filled 2012-02-06: qty 2

## 2012-02-06 MED ORDER — FLUTICASONE PROPIONATE HFA 44 MCG/ACT IN AERO
1.0000 | INHALATION_SPRAY | Freq: Two times a day (BID) | RESPIRATORY_TRACT | Status: DC
  Filled 2012-02-06 (×2): qty 10.6

## 2012-02-06 MED ORDER — PRASUGREL HCL 10 MG PO TABS
ORAL_TABLET | ORAL | Status: AC
  Filled 2012-02-06: qty 6

## 2012-02-06 MED ORDER — ACETAMINOPHEN 325 MG PO TABS
650.0000 mg | ORAL_TABLET | ORAL | Status: DC | PRN
  Administered 2012-02-06 – 2012-02-07 (×2): 650 mg via ORAL
  Filled 2012-02-06 (×2): qty 2

## 2012-02-06 MED ORDER — SODIUM CHLORIDE 0.9 % IV SOLN
INTRAVENOUS | Status: AC
  Administered 2012-02-06: 14:00:00 via INTRAVENOUS

## 2012-02-06 MED ORDER — SODIUM CHLORIDE 0.9 % IJ SOLN: 3.0000 mL | Freq: Two times a day (BID) | INTRAMUSCULAR | Status: DC

## 2012-02-06 MED ORDER — PRASUGREL HCL 10 MG PO TABS
10.0000 mg | ORAL_TABLET | Freq: Every day | ORAL | Status: DC
  Filled 2012-02-06: qty 1

## 2012-02-06 MED ORDER — SODIUM CHLORIDE 0.9 % IJ SOLN: 3.0000 mL | INTRAMUSCULAR | Status: DC | PRN

## 2012-02-06 MED ORDER — CLOPIDOGREL BISULFATE 75 MG PO TABS
75.0000 mg | ORAL_TABLET | ORAL | Status: AC
  Administered 2012-02-06: 75 mg via ORAL

## 2012-02-06 MED ORDER — MORPHINE SULFATE 2 MG/ML IJ SOLN: 2.0000 mg | INTRAMUSCULAR | Status: DC | PRN

## 2012-02-06 MED ORDER — LIDOCAINE HCL (PF) 1 % IJ SOLN
INTRAMUSCULAR | Status: AC
  Filled 2012-02-06: qty 30

## 2012-02-06 MED ORDER — ONDANSETRON HCL 4 MG/2ML IJ SOLN: 4.0000 mg | Freq: Four times a day (QID) | INTRAMUSCULAR | Status: DC | PRN

## 2012-02-06 MED ORDER — ALBUTEROL SULFATE HFA 108 (90 BASE) MCG/ACT IN AERS
2.0000 | INHALATION_SPRAY | RESPIRATORY_TRACT | Status: DC | PRN
  Filled 2012-02-06: qty 6.7

## 2012-02-06 MED ORDER — METOPROLOL TARTRATE 12.5 MG HALF TABLET
12.5000 mg | ORAL_TABLET | Freq: Two times a day (BID) | ORAL | Status: DC
  Administered 2012-02-06 (×2): 12.5 mg via ORAL
  Filled 2012-02-06 (×4): qty 1

## 2012-02-06 MED ORDER — SODIUM CHLORIDE 0.9 % IV SOLN
INTRAVENOUS | Status: DC
  Administered 2012-02-06: 09:00:00 via INTRAVENOUS

## 2012-02-06 MED ORDER — SIMVASTATIN 40 MG PO TABS: 40.0000 mg | ORAL_TABLET | Freq: Every day | ORAL | Status: DC

## 2012-02-06 MED ORDER — SIMVASTATIN 40 MG PO TABS
40.0000 mg | ORAL_TABLET | Freq: Every day | ORAL | Status: DC
  Administered 2012-02-06: 22:00:00 40 mg via ORAL
  Filled 2012-02-06 (×2): qty 1

## 2012-02-06 MED ORDER — NITROGLYCERIN 0.2 MG/ML ON CALL CATH LAB
INTRAVENOUS | Status: AC
  Filled 2012-02-06: qty 1

## 2012-02-06 MED ORDER — NITROGLYCERIN IN D5W 200-5 MCG/ML-% IV SOLN
2.0000 ug/min | INTRAVENOUS | Status: DC
  Administered 2012-02-06: 10 ug/min via INTRAVENOUS
  Administered 2012-02-06: 20 ug/min via INTRAVENOUS

## 2012-02-06 MED ORDER — SODIUM CHLORIDE 0.9 % IV SOLN: 250.0000 mL | INTRAVENOUS | Status: DC | PRN

## 2012-02-06 MED ORDER — BIVALIRUDIN 250 MG IV SOLR
INTRAVENOUS | Status: AC
  Filled 2012-02-06: qty 250

## 2012-02-06 MED ORDER — FLUTICASONE PROPIONATE 50 MCG/ACT NA SUSP
2.0000 | Freq: Every day | NASAL | Status: DC
  Filled 2012-02-06: qty 16

## 2012-02-06 MED ORDER — MONTELUKAST SODIUM 10 MG PO TABS
10.0000 mg | ORAL_TABLET | Freq: Every day | ORAL | Status: DC
  Administered 2012-02-06: 22:00:00 10 mg via ORAL
  Filled 2012-02-06 (×2): qty 1

## 2012-02-06 MED ORDER — FENTANYL CITRATE 0.05 MG/ML IJ SOLN
INTRAMUSCULAR | Status: AC
  Filled 2012-02-06: qty 2

## 2012-02-06 MED ORDER — HEPARIN SODIUM (PORCINE) 1000 UNIT/ML IJ SOLN
INTRAMUSCULAR | Status: AC
  Filled 2012-02-06: qty 1

## 2012-02-06 MED ORDER — CLOPIDOGREL BISULFATE 75 MG PO TABS
ORAL_TABLET | ORAL | Status: AC
  Administered 2012-02-06: 75 mg via ORAL
  Filled 2012-02-06: qty 1

## 2012-02-06 NOTE — Telephone Encounter (Signed)
Refill: Simvastatin 40mg  #90.

## 2012-02-06 NOTE — Telephone Encounter (Signed)
lmom labs good 

## 2012-02-06 NOTE — H&P (View-Only) (Signed)
[address] [place], [place]  [address] Phone: [phone], Fax:  [phone]  Date:  02/05/2012   Name:  George Alvarez   DOB:  12/14/1945   MRN:  [id number]  PCP:  William Hopper, MD  Primary Cardiologist:  Dr. Christopher McAlhany (new) Primary Electrophysiologist:  None    History of Present Illness: George Alvarez is a 66 y.o. male who presents to the office today for an ETT.  He is referred by his PCP.  He has no hx of CAD.  He does have a hx of HTN, HL, asthma and skin cancer.  He is a non-smoker.  He has a significant FHx of CAD.  Father died with MI in 50s and Mother had MI in her 60s.  He recently was dx with HTN.  Medications have been adjusted.  He has a hx of occasional, atypical chest pain.  Describes as tightness.  No pain in 2 mos.  No exertional symptoms.  No dyspnea.  No syncope.  ETT today is abnormal and suggestive of obstructive CAD. He has been seen as a new patient by Dr. Christopher McAlhany today and we have arranged cardiac cath.    Exercise Treadmill Test  Pre-Exercise Testing Evaluation Rhythm: normal sinus  Rate: 76   PR:  .17 QRS:  .09  QT:  .36 QTc: .40     Test  Exercise Tolerance Test Ordering MD: William Hopper , MD  Interpreting MD: Jaquez Farrington PA-C  Unique Test No: 1  Treadmill:  1  Indication for ETT: HTN  Contraindication to ETT: No   Stress Modality: exercise - treadmill  Cardiac Imaging Performed: non   Protocol: standard Bruce - maximal  Max BP:  215/75  Max MPHR (bpm):  157 85% MPR (bpm):  133  MPHR obtained (bpm):  141 % MPHR obtained:  88%  Reached 85% MPHR (min:sec):  6:13 Total Exercise Time (min-sec):  7:00  Workload in METS:  8.1 Borg Scale: 15  Reason ETT Terminated:  desired heart rate attained    ST Segment Analysis At Rest: non-specific ST segment slurring With Exercise: significant ischemic ST depression  Other Information Arrhythmia:  No Angina during ETT:  absent (0) Quality of ETT:   diagnostic  ETT Interpretation:  abnormal - evidence of ST depression consistent with ischemia  Comments: Fair exercise tolerance. No chest pain. Hypertensive BP response to exercise. Inf-Lat ST depression consistent with ischemia that persisted in recovery for several minutes.  Recommendations: D/w Dr. Steven Klein (DOD) and Dr. Christopher McAlhany. ETT suggestive of obstructive CAD. Dr. Christopher McAlhany also saw the patient. Patient will be set up for cardiac cath tomorrow with Dr. Christopher McAlhany. Signed,  Mussa Groesbeck, PA-C  1:20 PM 02/05/2012     Past Medical History  Diagnosis Date  . Hyperlipidemia   . GERD (gastroesophageal reflux disease)   . Perennial allergic rhinitis   . Asthma     post infectious; envirronmental triggers    Past Surgical History  Procedure Date  . Septoplasty   . Hernia repair   . Vasectomy   . Tonsillectomy and adenoidectomy   . Trigger finger release   . Injection knee   . Epidural steroids 2007, 2009    X 2 @ cervical &, lumbar)  . Colonoscopy 2003, 2011    negative  . Upper gastrointestinal endoscopy 2011    gastric polyps     Current Outpatient Prescriptions  Medication Sig Dispense Refill  . albuterol (PROVENTIL   HFA;VENTOLIN HFA) 108 (90 BASE) MCG/ACT inhaler Inhale 2 puffs into the lungs as needed.      . beclomethasone (QVAR) 80 MCG/ACT inhaler Inhale 1 puff into the lungs as needed.      . Chlorpheniramine Maleate ([employer] ALLERGY PO) Take by mouth.      . esomeprazole (NEXIUM) 40 MG capsule Take 40 mg by mouth daily before breakfast.      . hydrochlorothiazide (MICROZIDE) 12.5 MG capsule Take 1 capsule (12.5 mg total) by mouth daily.  90 capsule  1  . metoprolol tartrate (LOPRESSOR) 25 MG tablet 1/2 bid  90 tablet  1  . mometasone (NASONEX) 50 MCG/ACT nasal spray Place 2 sprays into the nose daily.      . montelukast (SINGULAIR) 10 MG tablet Take 10 mg by mouth at bedtime.      . nitroGLYCERIN (NITROSTAT) 0.4 MG SL  tablet Place 1 tablet (0.4 mg total) under the tongue every 5 (five) minutes as needed for chest pain.  25 tablet  3  . simvastatin (ZOCOR) 40 MG tablet Take 1 tablet (40 mg total) by mouth at bedtime.  90 tablet  1    Allergies: Allergies  Allergen Reactions  . Aspirin     Angioedema in 1980s  . Nifedipine     edema    Social History:  The patient  reports that he has never smoked. He does not have any smokeless tobacco history on file. He reports that he drinks alcohol. He reports that he does not use illicit drugs.   Family History:  The patient's family history includes Breast cancer in his maternal aunt, maternal grandmother, and mother; Dementia in his maternal grandmother; Diabetes in his maternal grandmother; Esophageal cancer in his mother; Heart attack in his maternal grandmother; Heart attack (age of onset:48) in his father; Heart attack (age of onset:52) in his paternal uncle; Heart disease in his mother; and Stroke in his maternal grandmother.   ROS:  Please see the history of present illness.    All other systems reviewed and negative.   PHYSICAL EXAM: VS:  BP 154/60  Pulse 76 Well nourished, well developed, in no acute distress HEENT: normal Neck: no JVD at 90 degrees Vascular: no carotid bruits Cardiac:  normal S1, S2; RRR; no murmur Lungs:  clear to auscultation bilaterally, no wheezing, rhonchi or rales Abd: soft, nontender Ext: no edema Skin: warm and dry Neuro:  CNs 2-12 intact, no focal abnormalities noted      ASSESSMENT AND PLAN:  1. Chest Pain:   As noted, his stress test is abnormal.  It is suggestive of obstructive CAD.  He has significant risk factors.  Continue beta blocker and statin. I will give him a Rx for NTG.  He reports a ? Hx of allergy to ASA.  He tells me he has been able to take NSAIDs.  Also has taken Excedrin in the past without problems.  His chart lists angioedema as the reaction to ASA.  I will not start ASA tonight.  He was seen by Dr.  Christopher McAlhany today as well.  We have recommended cardiac catheterization.  Risks and benefits of cardiac catheterization have been discussed with the patient.  These include bleeding, infection, kidney damage, stroke, heart attack, death.  The patient understands these risks and is willing to proceed.  I will give him Plavix pre-cath tomorrow.  If he has obstructive CAD, will need to decide if he can take ASA or not.   2. Hypertension:    Borderline control.  Continue to monitor.  3. Hyperlipidemia:  Continue statin.    Signed, Robbert Langlinais, PA-C  1:20 PM 02/05/2012     I have personally seen and examined this patient with Edna Grover, PA-C. I agree with the assessment and plan as outlined above. We will plan cardiac cath tomorrow morning to exclude obstructive CAD. Risks and benefits reviewed with pt. He agrees to proceed. I have seen him with Mr. Edison Wollschlager on 02/05/12 at 12:00pm.   MCALHANY,CHRISTOPHER 7:24 AM 02/06/2012  

## 2012-02-06 NOTE — Progress Notes (Signed)
TR BAND REMOVAL  LOCATION:  right radial  DEFLATED PER PROTOCOL:  yes  TIME BAND OFF / DRESSING APPLIED: 1800     SITE UPON ARRIVAL:   Level 0  SITE AFTER BAND REMOVAL:  Level 0  REVERSE ALLEN'S TEST:    positive  CIRCULATION SENSATION AND MOVEMENT:  Within Normal Limits  yes  COMMENTS:     

## 2012-02-06 NOTE — H&P (Signed)
Note signed. cdm 

## 2012-02-06 NOTE — Telephone Encounter (Signed)
RX sent

## 2012-02-06 NOTE — Telephone Encounter (Signed)
Message copied by Tarri Fuller on Fri Feb 06, 2012  9:29 AM ------      Message from: [place], [place] T      Created: Thu Feb 05, 2012  2:59 PM       Labs ok      INR pending      Tereso Newcomer, PA-C  2:59 PM 02/05/2012

## 2012-02-06 NOTE — CV Procedure (Signed)
Cardiac Catheterization Operative Report  George Alvarez [id number] 11/1/201312:21 PM Marga Melnick, MD  Procedure Performed:  1. Left Heart Catheterization 2. Selective Coronary Angiography 3. Left ventricular angiogram 4. PTCA/bare metal stent x 1 OM2 5. IVUS Circumflex/OM2  Operator: Verne Carrow, MD  Arterial access site:  Right radial artery.   Indication:  Unstable angina, abnormal stress test 02/05/12.                                    Procedure Details: The risks, benefits, complications, treatment options, and expected outcomes were discussed with the patient. The patient and/or family concurred with the proposed plan, giving informed consent. The patient was brought to the cath lab after IV hydration was begun and oral premedication was given. The patient was further sedated with Versed and Fentanyl.  The right wrist was assessed with an Allens test which was positive. The right wrist was prepped and draped in a sterile fashion. 1% lidocaine was used for local anesthesia. Using the modified Seldinger access technique, a 5 French sheath was placed in the right radial artery. 3 mg Verapamil was given through the sheath. 5000 units IV heparin was given. Standard diagnostic catheters were used to perform selective coronary angiography. A pigtail catheter was used to perform a left ventricular angiogram. The patient was found to have a severe stenosis of the second obtuse marginal branch. This vessel was large in caliber. The sheath was upsized to a 6 [place] system. He was given 60 mg Effient po x 1 (ASA allergy). He was given a bolus of Angiomax and a drip was started. I then engaged the left main with a XB 3.5 guiding catheter. When the ACT was greater than 200, I passed a BMW wire down the Circumflex into the second OM branch. I then used a 3.0 x 12 mm balloon to pre-dilate the stenosis. Since the vessel was very large, I then passed an IVUS catheter down the OM2 to size  the vessel. The vessel was 5.2-5.5 mm both before and after the stenosis with a slight aneurysmal segment just beyond the severe stenosis. I then deployed a 5.0 x 16 mm Veriflex bare metal stent in the mid portion of the second OM branch. After stent deployment, there was an obvious tear in the vessel c/w a dissection. I then carefully positioned and deployed a 4.5 x 12 mm bare metal stent in an overlapping fashion in the distal edge of the first stent. The dissection was sealed with this stent. The 4.5 stent balloon was then used to post-dilate the overlap. Following the deployment of the second stent, there was a residual staining of dye on the superior surface of the stented segment in the bend. I contemplated using a [place] balloon in this segment but given the tortuosity of the vessel and knowing this had been an aneurysmal segment. The flow was excellent down the vessel. I waited for ten minutes then took additional angiography and the dye staining on the superior edge of the stent had resolved. I stopped at this point in the procedure.    The sheath was removed from the right radial artery and a Terumo hemostasis band was applied at the arteriotomy site on the right wrist.    There were no immediate complications. The patient was taken to the recovery area in stable condition.   Hemodynamic Findings: Central aortic pressure: 150/89 Left ventricular pressure: 169/10/18  Angiographic Findings:  Left main:  10% distal stenosis.   Left Anterior Descending Artery: Large caliber vessel that courses to the apex with mild luminal irregularities proximal and distal.   Circumflex Artery: Very large caliber vessel. The first OM is small and has an ostial 40% stenosis. The second OM is very large (5.88mm) and has a 95% stenosis in the proximal portion of the vessel.   Right Coronary Artery: Large, dominant artery with 30% proximal stenosis, 60% mid stenosis, diffuse 30% distal disease. The PDA and PLA are  patent with mild plaque disease.   Left Ventricular Angiogram: LVEF=65%  Impression: 1. Double vessel CAD 2. Unstable angina 3. Normal LV systolic function 4. Successful PTCA/bare metal stent x 1 OM2.   Recommendations: He is intolerant of ASA. Will continue Effient for at least one month but preferably longer if he tolerates it. Continue beta blocker and statin.        Complications:  None. The patient tolerated the procedure well.

## 2012-02-06 NOTE — Interval H&P Note (Signed)
History and Physical Interval Note:  02/06/2012 10:52 AM  George Alvarez  has presented today for cardiac cath  with the diagnosis of cp/abnormal stress test  The various methods of treatment have been discussed with the patient and family. After consideration of risks, benefits and other options for treatment, the patient has consented to  Procedure(s) (LRB) with comments: LEFT HEART CATHETERIZATION WITH CORONARY ANGIOGRAM (N/A) as a surgical intervention .  The patient's history has been reviewed, patient examined, no change in status, stable for surgery.  I have reviewed the patient's chart and labs.  Questions were answered to the patient's satisfaction.     [name]

## 2012-02-07 ENCOUNTER — Encounter (HOSPITAL_COMMUNITY): Payer: Self-pay | Admitting: Nurse Practitioner

## 2012-02-07 DIAGNOSIS — I2510 Atherosclerotic heart disease of native coronary artery without angina pectoris: Secondary | ICD-10-CM

## 2012-02-07 DIAGNOSIS — R9439 Abnormal result of other cardiovascular function study: Secondary | ICD-10-CM

## 2012-02-07 DIAGNOSIS — I200 Unstable angina: Secondary | ICD-10-CM

## 2012-02-07 LAB — BASIC METABOLIC PANEL
CO2: 27 mEq/L (ref 19–32)
Calcium: 9.1 mg/dL (ref 8.4–10.5)
Creatinine, Ser: 1.03 mg/dL (ref 0.50–1.35)
GFR calc non Af Amer: 75 mL/min — ABNORMAL LOW (ref >90)
Glucose, Bld: 108 mg/dL — ABNORMAL HIGH (ref 70–99)

## 2012-02-07 LAB — CBC
MCH: 30.4 pg (ref 26.0–34.0)
MCHC: 34.9 g/dL (ref 30.0–36.0)
MCV: 87 fL (ref 78.0–100.0)
Platelets: 142 10*3/uL — ABNORMAL LOW (ref 150–400)
RBC: 4.84 MIL/uL (ref 4.22–5.81)

## 2012-02-07 MED ORDER — PRASUGREL HCL 10 MG PO TABS: 10.0000 mg | ORAL_TABLET | Freq: Every day | ORAL | Status: DC

## 2012-02-07 NOTE — Progress Notes (Signed)
Patient Name: George Alvarez [facility] Date of Encounter: 02/07/2012   Principal Problem:  *Unstable angina Active Problems:  CAD (coronary artery disease)  HYPERLIPIDEMIA  HYPERTENSION, ESSENTIAL NOS  ASTHMA  GERD    SUBJECTIVE  No c/p, sob.  Ambulating w/o difficulty.  CURRENT MEDS    . bivalirudin      . clopidogrel  75 mg Oral Pre-Cath  . fentaNYL      . fluticasone  2 spray Each Nare Daily  . fluticasone  1 puff Inhalation BID  . heparin      . heparin      . hydrochlorothiazide  12.5 mg Oral Daily  . lidocaine      . metoprolol tartrate  12.5 mg Oral BID  . midazolam      . midazolam      . midazolam      . montelukast  10 mg Oral QHS  . nitroGLYCERIN      . nitroGLYCERIN      . pantoprazole  40 mg Oral Daily  . prasugrel      . prasugrel  10 mg Oral Daily  . simvastatin  40 mg Oral QHS  . verapamil      . DISCONTD: sodium chloride  3 mL Intravenous Q12H    OBJECTIVE  Filed Vitals:   02/07/12 0300 02/07/12 0500 02/07/12 0715 02/07/12 0744  BP: 134/83 145/92 155/97 151/93  Pulse:    75  Temp:  99 F (37.2 C)  98.9 F (37.2 C)  TempSrc:  Oral  Oral  Resp: 20 18 19 19   Height:      Weight:      SpO2:  96%  96%    Intake/Output Summary (Last 24 hours) at 02/07/12 0750 Last data filed at 02/07/12 0700  Gross per 24 hour  Intake 1071.25 ml  Output   1950 ml  Net -878.75 ml   Filed Weights   02/06/12 0755 02/07/12 0000  Weight: 211 lb (95.709 kg) 224 lb 6.9 oz (101.8 kg)    PHYSICAL EXAM  General: Pleasant, NAD. Neuro: Alert and oriented X 3. Moves all extremities spontaneously. Psych: Normal affect. HEENT:  Normal  Neck: Supple without bruits or JVD. Lungs:  Resp regular and unlabored, CTA. Heart: RRR no s3, s4, or murmurs. Abdomen: Soft, non-tender, non-distended, BS + x 4.  Extremities: No clubbing, cyanosis or edema. DP/PT/Radials 2+ and equal bilaterally.  Accessory Clinical Findings  CBC  Basename 02/07/12 0625 02/05/12 1151    WBC 9.8 6.7  NEUTROABS -- 4.2  HGB 14.7 14.8  HCT 42.1 44.6  MCV 87.0 91.0  PLT 142* 164.0   Basic Metabolic Panel  Basename 02/05/12 1151  NA 139  K 3.9  CL 103  CO2 28  GLUCOSE 111*  BUN 16  CREATININE 1.1  CALCIUM 9.0  MG --  PHOS --   TELE  Sinus rhythm, pvc's, couplets.  ECG  Rsr, 78, no acute st/t changes.  Radiology/Studies  No results found.  ASSESSMENT AND PLAN  1. USA/CAD:  S/p PCI/BMS x 2 to the OM2.  Ambulating w/o difficulty.  No chest pain.  BMET pending this AM.  If wnl, plan d/c today.  Cont Effient as outlined by Dr. Clifton James.  He is intolerant to ASA w/ h/o lip swelling.  Cont bb/statin.  F/u in 10-14 days.  2.  HTN:  BP elevated overnight.  Titrate HCTZ to 25mg  daily.  F/U bmet this AM.  Add KCl if needed.  3.  HL:  Cont statin therapy.  4.  Asthma:  No wheezing.  Cont inhaler therapy.  Signed, Nicolasa Ducking NP  Attending note:  Patient seen and examined. Reviewed database. He is status post abnormal GXT with subsequent cardiac catheterization and BMS x2 to the obtuse marginal. He is stable and having no chest pain symptoms, has ambulated this morning.  Afebrile, systolic blood pressure 128 up to 151, heart rate in the 70s in sinus rhythm. Lungs are clear, cardiac with regular rate and rhythm, stable catheterization site. Potassium 4.0, BUN 14, creatinine 1.0, hemoglobin 14.7.  Patient for discharge home today. He is aspirin intolerant with what sounds like true allergy and will be treated with at least one month of Effient per Dr. Gibson Ramp recommendation. Otherwise continue beta blocker and statin therapy, HCTZ. Outpatient followup is arranged.  Jonelle Sidle, M.D., F.A.C.C.

## 2012-02-07 NOTE — Discharge Summary (Signed)
Patient ID: George Alvarez,  MRN: [id number], DOB/AGE: 1946-01-13 66 y.o.  Admit date: 02/06/2012 Discharge date: 02/07/2012  Primary Care Provider: Marga Melnick Primary Cardiologist: C. Clifton James, MD  Discharge Diagnoses Principal Problem:  *Unstable angina  **s/p Cath/PCI with placement of 2 Bare Metal Stents in the OM2 this admission. Active Problems:  CAD (coronary artery disease)  HYPERLIPIDEMIA  HYPERTENSION, ESSENTIAL NOS  ASTHMA  GERD   Allergies Allergies  Allergen Reactions  . Aspirin     Angioedema in 1980s  . Nifedipine Rash    edema    Procedures  Cardiac Catheterization and Percutaneous Coronary Intervention 02/06/2012  Hemodynamic Findings: Central aortic pressure: 150/89 Left ventricular pressure: 169/10/18  Angiographic Findings:  Left main:  10% distal stenosis.   Left Anterior Descending Artery: Large caliber vessel that courses to the apex with mild luminal irregularities proximal and distal.   Circumflex Artery: Very large caliber vessel. The first OM is small and has an ostial 40% stenosis. The second OM is very large (5.48mm) and has a 95% stenosis in the proximal portion of the vessel.     **The OM2 was successfully stented using a 5.0 x 16 mm Veriflex BMS along with a 4.5 x 12 mm Veriflex BMS, deployed in an overlapping fashion.  Right Coronary Artery: Large, dominant artery with 30% proximal stenosis, 60% mid stenosis, diffuse 30% distal disease. The PDA and PLA are patent with mild plaque disease.  Left Ventricular Angiogram: LVEF=65% _____________  History of Present Illness  66 year old male without prior cardiac history who was referred to our office for an exercise treadmill test secondary to complaints of intermittent chest tightness. With exercise, patient was noted to have inferolateral ST segment depression which persisted for several minutes into recovery and was felt to be consistent with ischemia. Decision was made to arrange for  an outpatient cardiac catheterization.  Hospital Course  Patient presented to the [facility] cardiac catheterization laboratory on 02/06/2012 and underwent diagnostic cardiac catheterization revealing severe stenosis in a large second obtuse marginal. This area was successfully stented using 2, overlapping bare-metal stents. Patient tolerated procedure well and post procedure has been ambulating without recurrent symptoms or limitations. Of note, he does have a true aspirin allergy and will be discharged on Effient only for a minimum of 30 days.   Discharge Vitals Blood pressure 151/93, pulse 75, temperature 98.9 F (37.2 C), temperature source Oral, resp. rate 19, height 5\' 10"  (1.778 m), weight 224 lb 6.9 oz (101.8 kg), SpO2 96.00%.  Filed Weights   02/06/12 0755 02/07/12 0000  Weight: 211 lb (95.709 kg) 224 lb 6.9 oz (101.8 kg)   Labs  CBC  Basename 02/07/12 0625 02/05/12 1151  WBC 9.8 6.7  NEUTROABS -- 4.2  HGB 14.7 14.8  HCT 42.1 44.6  MCV 87.0 91.0  PLT 142* 164.0   Basic Metabolic Panel  Basename 02/07/12 0625 02/05/12 1151  NA 138 139  K 4.0 3.9  CL 103 103  CO2 27 28  GLUCOSE 108* 111*  BUN 14 16  CREATININE 1.03 1.1  CALCIUM 9.1 9.0  MG -- --  PHOS -- --   Disposition  Pt is being discharged home today in good condition.  Follow-up Plans & Appointments  Follow-up Information    Follow up with Tereso Newcomer, PA. In 2 weeks. (we will arrange.)    Contact information:   [address] [place] [address] [phone]       Follow up with Marga Melnick,  MD. (as scheduled.)    Contact information:   [address] [facility] [address] [place] [address] [phone]         Discharge Medications    Medication List     As of 02/07/2012  8:53 AM    TAKE these medications         albuterol 108 (90 BASE) MCG/ACT inhaler   Commonly known as: PROVENTIL HFA;VENTOLIN HFA   Inhale 2 puffs into the lungs as needed. For  wheezing      beclomethasone 80 MCG/ACT inhaler   Commonly known as: QVAR   Inhale 1 puff into the lungs 2 (two) times daily.      [facility] ALLERGY PO   Take 1 tablet by mouth daily.      esomeprazole 40 MG capsule   Commonly known as: NEXIUM   Take 40 mg by mouth daily before breakfast.      hydrochlorothiazide 12.5 MG capsule   Commonly known as: MICROZIDE   Take 1 capsule (12.5 mg total) by mouth daily.      metoprolol tartrate 25 MG tablet   Commonly known as: LOPRESSOR   Take 12.5 mg by mouth 2 (two) times daily.      mometasone 50 MCG/ACT nasal spray   Commonly known as: NASONEX   Place 2 sprays into the nose daily.      montelukast 10 MG tablet   Commonly known as: SINGULAIR   Take 10 mg by mouth at bedtime.      nitroGLYCERIN 0.4 MG SL tablet   Commonly known as: NITROSTAT   Place 1 tablet (0.4 mg total) under the tongue every 5 (five) minutes as needed for chest pain.      prasugrel 10 MG Tabs   Commonly known as: EFFIENT   Take 1 tablet (10 mg total) by mouth daily.      simvastatin 40 MG tablet   Commonly known as: ZOCOR   Take 1 tablet (40 mg total) by mouth at bedtime.       Outstanding Labs/Studies  None Duration of Discharge Encounter   Greater than 30 minutes including physician time.  Signed, Nicolasa Ducking NP 02/07/2012, 8:53 AM

## 2012-02-07 NOTE — Progress Notes (Signed)
CARDIAC REHAB PHASE I   PRE:  Rate/Rhythm: 75 NSR  BP:  Supine:   Sitting: 155/93  Standing:    SaO2: 97% RA  MODE:  Ambulation: 1,000 ft   POST:  Rate/Rhythem: 86 NRS  BP:  Supine:   Sitting: 159/93  Standing:    SaO2: 98% RA Patient ambulated 1,038ft independently. VSS. Patient had no complaints. Patient said he had no BP meds this morning and was unsure what the plan was for his BP since he just started a new medication. Patient was advised to speak with cardiologist regarding his new meds with BP. Patient works in [place] and is unsure about outpatient cardiac rehab. He travels every 2 weeks and will call our department when and if he can attend the rehab sessions. All education completed. Reviewed stent, risk factors, CP, Effient use, when to call 911, heart healthy diet, and exercise recommendations.   Daianna Vasques, Toni Amend

## 2012-02-07 NOTE — Plan of Care (Signed)
Problem: Consults Goal: PCI Patient Education (See Patient Education module for education specifics.)  Outcome: Completed/Met Date Met:  02/07/12 Reviewed all information with patient with god verbalized understanding  Problem: Phase III Progression Outcomes Goal: No angina with increased activity Outcome: Completed/Met Date Met:  02/07/12 Ambulating in hall with no arrhythmia, chest pain or shortness or breath, steady gait, tolerating well  Problem: Discharge Progression Outcomes Goal: Discharge plan in place and appropriate Outcome: Completed/Met Date Met:  02/07/12 Discharge home today as planned Goal: Pain controlled with appropriate interventions Outcome: Completed/Met Date Met:  02/07/12 Painfree Goal: Vascular site scale level 0 - I Vascular Site Scale Level 0: No bruising/bleeding/hematoma Level I (Mild): Bruising/Ecchymosis, minimal bleeding/ooozing, palpable hematoma < 3 cm Level II (Moderate): Bleeding not affecting hemodynamic parameters, pseudoaneurysm, palpable hematoma > 3 cm  Outcome: Completed/Met Date Met:  02/07/12 Right radial cath site, level 0, no hematoma, swelling or bleeding , gauze dressing dry and intact circulation, sensation and motion within normal limits in right hand and extremity, pulses radial and ulner palpable. Positive reverse allen's.

## 2012-02-07 NOTE — Discharge Summary (Signed)
Please see also my chart note from today.

## 2012-02-09 MED FILL — Dextrose Inj 5%: INTRAVENOUS | Qty: 50 | Status: AC

## 2012-02-23 ENCOUNTER — Ambulatory Visit (INDEPENDENT_AMBULATORY_CARE_PROVIDER_SITE_OTHER): Payer: BC Managed Care – PPO | Admitting: Physician Assistant

## 2012-02-23 ENCOUNTER — Encounter: Payer: Self-pay | Admitting: Physician Assistant

## 2012-02-23 ENCOUNTER — Encounter: Payer: BC Managed Care – PPO | Admitting: Physician Assistant

## 2012-02-23 ENCOUNTER — Telehealth: Payer: Self-pay | Admitting: *Deleted

## 2012-02-23 VITALS — BP 134/82 | HR 78 | Ht 70.0 in | Wt 214.0 lb

## 2012-02-23 DIAGNOSIS — I2510 Atherosclerotic heart disease of native coronary artery without angina pectoris: Secondary | ICD-10-CM

## 2012-02-23 DIAGNOSIS — I10 Essential (primary) hypertension: Secondary | ICD-10-CM

## 2012-02-23 DIAGNOSIS — R5381 Other malaise: Secondary | ICD-10-CM

## 2012-02-23 DIAGNOSIS — R5383 Other fatigue: Secondary | ICD-10-CM

## 2012-02-23 DIAGNOSIS — E785 Hyperlipidemia, unspecified: Secondary | ICD-10-CM

## 2012-02-23 LAB — CBC WITH DIFFERENTIAL/PLATELET
Basophils Absolute: 0 10*3/uL (ref 0.0–0.1)
Eosinophils Absolute: 0.1 10*3/uL (ref 0.0–0.7)
HCT: 44.7 % (ref 39.0–52.0)
Lymphs Abs: 1.9 10*3/uL (ref 0.7–4.0)
MCV: 91.5 fl (ref 78.0–100.0)
Monocytes Absolute: 0.6 10*3/uL (ref 0.1–1.0)
Neutrophils Relative %: 64.4 % (ref 43.0–77.0)
Platelets: 201 10*3/uL (ref 150.0–400.0)
RDW: 12.9 % (ref 11.5–14.6)

## 2012-02-23 NOTE — Patient Instructions (Addendum)
Your physician recommends that you return for lab work in: TODAY CBC W/DIFF  YOU HAVE A FOLLOW UP APPOINTMENT WITH DR. Clifton James 04/29/12 @ 3:15  NO CHANGES WERE MADE TODAY

## 2012-02-23 NOTE — Telephone Encounter (Signed)
pt notified about lab results w/verbal understanding 

## 2012-02-23 NOTE — Telephone Encounter (Signed)
Message copied by Tarri Fuller on Mon Feb 23, 2012  5:47 PM ------      Message from: [place], [place] T      Created: Mon Feb 23, 2012  4:46 PM       Normal      Tereso Newcomer, [place]  4:46 PM 02/23/2012

## 2012-02-23 NOTE — Progress Notes (Signed)
[address] [place], [place]  [address] Phone: [phone], Fax:  [phone]  Date:  02/23/2012   Name:  George Alvarez   DOB:  October 25, 1945   MRN:  [id number]  PCP:  Marga Melnick, MD  Primary Cardiologist:  Dr. Verne Carrow  Primary Electrophysiologist:  None    History of Present Illness: George Alvarez is a 66 y.o. male who returns for follow up after recent admission to the hospital for cardiac catheterization.  He has a hx of HTN, HL. He was referred over for an ETT by his PCP on 02/05/12. I did his stress test. This was abnormal. He was seen with Dr. Verne Carrow.  We arranged cardiac catheterization. This was performed 02/06/12 and demonstrated 95% proximal OM2 lesion and a 60% mid RCA lesion. EF was 65%. His OM2 was treated with a bare-metal stent x 2. Patient was placed on Effient only due to his history of intolerance to aspirin.  Since discharge, he is doing well. Denies chest pain, shortness of breath, syncope, orthopnea, PND or edema. He does report fatigue.  Labs (11/13):   K 4, creatinine 1.03, Hgb 14.7  Wt Readings from Last 3 Encounters:  02/23/12 214 lb (97.07 kg)  02/07/12 224 lb 6.9 oz (101.8 kg)  02/07/12 224 lb 6.9 oz (101.8 kg)     Past Medical History  Diagnosis Date  . Hyperlipidemia   . GERD (gastroesophageal reflux disease)   . Perennial allergic rhinitis   . Asthma     post infectious; envirronmental triggers  . Coronary artery disease     a. 02/2012 Cath/PCI: LM 10, LAD min irregs, LCX large, OM1 sm, 40 ost, OM2 95p (5.0x16 Veriflex & 4.5x12 Veriflex BMS'), RCA 30p, 29m, 30d, PDA/PLA min irregs, EF 65%  . Hypertension   . Skin cancer     Current Outpatient Prescriptions  Medication Sig Dispense Refill  . albuterol (PROVENTIL HFA;VENTOLIN HFA) 108 (90 BASE) MCG/ACT inhaler Inhale 2 puffs into the lungs as needed. For wheezing      . beclomethasone (QVAR) 80 MCG/ACT inhaler Inhale 1 puff into the  lungs 2 (two) times daily.       . Chlorpheniramine Maleate ([employer] ALLERGY PO) Take 1 tablet by mouth daily.       Marland Kitchen esomeprazole (NEXIUM) 40 MG capsule Take 40 mg by mouth daily before breakfast.      . hydrochlorothiazide (MICROZIDE) 12.5 MG capsule Take 1 capsule (12.5 mg total) by mouth daily.  90 capsule  1  . metoprolol tartrate (LOPRESSOR) 25 MG tablet Take 12.5 mg by mouth 2 (two) times daily.      . mometasone (NASONEX) 50 MCG/ACT nasal spray Place 2 sprays into the nose daily.      . montelukast (SINGULAIR) 10 MG tablet Take 10 mg by mouth at bedtime.      . nitroGLYCERIN (NITROSTAT) 0.4 MG SL tablet Place 1 tablet (0.4 mg total) under the tongue every 5 (five) minutes as needed for chest pain.  25 tablet  3  . prasugrel (EFFIENT) 10 MG TABS Take 1 tablet (10 mg total) by mouth daily.  30 tablet  6  . simvastatin (ZOCOR) 40 MG tablet Take 1 tablet (40 mg total) by mouth at bedtime.  90 tablet  1    Allergies: Allergies  Allergen Reactions  . Aspirin     Angioedema in 1980s  . Nifedipine Rash    edema    Social History:  The patient  reports that he has never smoked. He has never used smokeless tobacco. He reports that he drinks alcohol. He reports that he does not use illicit drugs.   ROS:  Please see the history of present illness.   Patient reports an episode of diarrhea with bright red blood per rectum. He has a history of anal fissure. This is not unusual for him.   All other systems reviewed and negative.   PHYSICAL EXAM: VS:  BP 134/82  Pulse 78  Ht 5\' 10"  (1.778 m)  Wt 214 lb (97.07 kg)  BMI 30.71 kg/m2  SpO2 98% Well nourished, well developed, in no acute distress HEENT: normal Neck: no JVD Cardiac:  normal S1, S2; RRR; no murmur Lungs:  clear to auscultation bilaterally, no wheezing, rhonchi or rales Abd: soft, nontender, no hepatomegaly Ext: no edema; right wrist without hematoma or bruit  Skin: warm and dry Neuro:  CNs 2-12 intact, no focal abnormalities  noted  EKG:   Sinus bradycardia, HR 59, leftward axis, nonspecific ST-T wave changes      ASSESSMENT AND PLAN:  1. Coronary Artery Disease:   Doing well post PCI.  Continue Effient.  Will likely change this to Plavix (intol to ASA) in the next 3-6 mos.  Continue statin.  Follow up with Dr. Verne Carrow in 6-8 weeks.  He cannot participate in cardiac rehab due to his work schedule.  2. Hypertension:   Controlled.  Continue current therapy.   3. Hyperlipidemia:   Managed by PCP.   4. Fatigue:   May be related to beta blocker.  Could consider d/c this if persists.  He had BRBPR recently.  This is typical with his fissure.  With effient Rx, will check a CBC today.  Signed, Tereso Newcomer, PA-C  2:40 PM 02/23/2012

## 2012-04-08 ENCOUNTER — Other Ambulatory Visit: Payer: Self-pay | Admitting: Nurse Practitioner

## 2012-04-29 ENCOUNTER — Ambulatory Visit (INDEPENDENT_AMBULATORY_CARE_PROVIDER_SITE_OTHER): Payer: 59 | Admitting: Cardiovascular Disease

## 2012-04-29 ENCOUNTER — Encounter: Payer: Self-pay | Admitting: Cardiovascular Disease

## 2012-04-29 VITALS — BP 140/86 | HR 82 | Ht 70.0 in | Wt 215.4 lb

## 2012-04-29 DIAGNOSIS — I10 Essential (primary) hypertension: Secondary | ICD-10-CM

## 2012-04-29 DIAGNOSIS — I2510 Atherosclerotic heart disease of native coronary artery without angina pectoris: Secondary | ICD-10-CM

## 2012-04-29 NOTE — Patient Instructions (Addendum)
Your physician wants you to follow-up in:  6 months. You will receive a reminder letter in the mail two months in advance. If you don't receive a letter, please call our office to schedule the follow-up appointment.   

## 2012-04-29 NOTE — Progress Notes (Signed)
History of Present Illness: 67 yo male with history of HTN, HLD, newly diagnosed CAD who is here today for follow up. He underwent a stress test on 02/05/12 which was suggestive of ischemia. Cardiac cath on 02/06/12 with 95% proximal OM2 lesion and a 60% mid RCA lesion. EF was 65%. His OM2 was very large in caliber and was treated with bare-metal stents x 2. Medical management of RCA stenosis which was felt to be moderate. Patient was placed on Effient only due to his history of intolerance to aspirin.   He is here today for follow up. Denies chest pain, shortness of breath, syncope, orthopnea, PND or edema. He had some episodes of dizziness while in [place] and was seen at [place]. He was told that he had vertigo. This has resolved.   Primary Care Physician: Marga Melnick  Last Lipid Profile:Lipid Panel     Component Value Date/Time   CHOL 134 05/09/2011 0946   TRIG 125.0 05/09/2011 0946   HDL 38.60* 05/09/2011 0946   CHOLHDL 3 05/09/2011 0946   VLDL 25.0 05/09/2011 0946   LDLCALC 70 05/09/2011 0946     Past Medical History  Diagnosis Date  . Hyperlipidemia   . GERD (gastroesophageal reflux disease)   . Perennial allergic rhinitis   . Asthma     post infectious; envirronmental triggers  . Coronary artery disease     a. 02/2012 Cath/PCI: LM 10, LAD min irregs, LCX large, OM1 sm, 40 ost, OM2 95p (5.0x16 Veriflex & 4.5x12 Veriflex BMS'), RCA 30p, 67m, 30d, PDA/PLA min irregs, EF 65%  . Hypertension   . Skin cancer     Past Surgical History  Procedure Date  . Septoplasty   . Hernia repair   . Vasectomy   . Tonsillectomy and adenoidectomy   . Trigger finger release   . Injection knee   . Epidural steroids 2007, 2009    X 2 @ cervical &, lumbar)  . Colonoscopy 2003, 2011    negative  . Upper gastrointestinal endoscopy 2011    gastric polyps  . Coronary angioplasty with stent placement 02/06/2012    OM2  bare metal     Current Outpatient Prescriptions  Medication Sig Dispense  Refill  . albuterol (PROVENTIL HFA;VENTOLIN HFA) 108 (90 BASE) MCG/ACT inhaler Inhale 2 puffs into the lungs as needed. For wheezing      . beclomethasone (QVAR) 80 MCG/ACT inhaler Inhale 1 puff into the lungs 2 (two) times daily.       . Chlorpheniramine Maleate ([facility] ALLERGY PO) Take 1 tablet by mouth daily.       Marland Kitchen EFFIENT 10 MG TABS TAKE 1 TABLET BY MOUTH DAILY  30 tablet  3  . esomeprazole (NEXIUM) 40 MG capsule Take 40 mg by mouth daily before breakfast.      . hydrochlorothiazide (MICROZIDE) 12.5 MG capsule Take 1 capsule (12.5 mg total) by mouth daily.  90 capsule  1  . metoprolol tartrate (LOPRESSOR) 25 MG tablet Take 12.5 mg by mouth 2 (two) times daily.      . mometasone (NASONEX) 50 MCG/ACT nasal spray Place 2 sprays into the nose daily.      . montelukast (SINGULAIR) 10 MG tablet Take 10 mg by mouth at bedtime.      . nitroGLYCERIN (NITROSTAT) 0.4 MG SL tablet Place 1 tablet (0.4 mg total) under the tongue every 5 (five) minutes as needed for chest pain.  25 tablet  3  . simvastatin (ZOCOR) 40 MG  tablet Take 1 tablet (40 mg total) by mouth at bedtime.  90 tablet  1    Allergies  Allergen Reactions  . Aspirin     Angioedema in 1980s  . Nifedipine Rash    edema    History   Social History  . Marital Status: Married    Spouse Name: N/A    Number of Children: N/A  . Years of Education: N/A   Occupational History  . Not on file.   Social History Main Topics  . Smoking status: Never Smoker   . Smokeless tobacco: Never Used  . Alcohol Use: Yes     Comment: Wine occasionally  . Drug Use: No  . Sexually Active: Not on file   Other Topics Concern  . Not on file   Social History Narrative  . No narrative on file    Family History  Problem Relation Age of Onset  . Heart attack Father 20    died @ 64  . Heart disease Mother   . Breast cancer Mother   . Esophageal cancer Mother     Barrett's  . Heart attack Maternal Grandmother     after 65  . Dementia Maternal  Grandmother     CVA  . Breast cancer Maternal Grandmother   . Diabetes Maternal Grandmother   . Stroke Maternal Grandmother     in 89s  . Heart attack Paternal Uncle 42  . Breast cancer Maternal Aunt     Review of Systems:  As stated in the HPI and otherwise negative.   BP 140/86  Pulse 82  Ht 5\' 10"  (1.778 m)  Wt 215 lb 6.4 oz (97.705 kg)  BMI 30.91 kg/m2  Physical Examination: General: Well developed, well nourished, NAD HEENT: OP clear, mucus membranes moist SKIN: warm, dry. No rashes. Neuro: No focal deficits Musculoskeletal: Muscle strength 5/5 all ext Psychiatric: Mood and affect normal Neck: No JVD, no carotid bruits, no thyromegaly, no lymphadenopathy. Lungs:Clear bilaterally, no wheezes, rhonci, crackles Cardiovascular: Regular rate and rhythm. No murmurs, gallops or rubs. Abdomen:Soft. Bowel sounds present. Non-tender.  Extremities: No lower extremity edema. Pulses are 2 + in the bilateral DP/PT.  Cardiac cath 02/06/12: Left main: 10% distal stenosis.  Left Anterior Descending Artery: Large caliber vessel that courses to the apex with mild luminal irregularities proximal and distal.  Circumflex Artery: Very large caliber vessel. The first OM is small and has an ostial 40% stenosis. The second OM is very large (5.5mm) and has a 95% stenosis in the proximal portion of the vessel.  Right Coronary Artery: Large, dominant artery with 30% proximal stenosis, 60% mid stenosis, diffuse 30% distal disease. The PDA and PLA are patent with mild plaque disease.  Left Ventricular Angiogram: LVEF=65%  Impression:  1. Double vessel CAD  2. Unstable angina  3. Normal LV systolic function  4. Successful PTCA/bare metal stent x 1 OM2.   Assessment and Plan:   1. CAD: Stable. Will continue Effient for at least one year. He is intolerant of ASA. Continue beta blocker and statin. He will need lipids and LFTs at f/u visit.   2. HTN: BP is borderline elevated. No changes today. He  will follow at home and let us know if it is not well controlled.

## 2012-05-22 ENCOUNTER — Other Ambulatory Visit: Payer: Self-pay

## 2012-06-08 ENCOUNTER — Other Ambulatory Visit: Payer: Self-pay | Admitting: Cardiovascular Disease

## 2012-07-12 ENCOUNTER — Other Ambulatory Visit: Payer: Self-pay | Admitting: Cardiovascular Disease

## 2012-08-11 ENCOUNTER — Other Ambulatory Visit: Payer: Self-pay | Admitting: Internal Medicine

## 2012-08-11 DIAGNOSIS — T887XXA Unspecified adverse effect of drug or medicament, initial encounter: Secondary | ICD-10-CM

## 2012-08-11 DIAGNOSIS — E785 Hyperlipidemia, unspecified: Secondary | ICD-10-CM

## 2012-08-11 NOTE — Telephone Encounter (Signed)
Future orders placed for GJ

## 2012-09-20 ENCOUNTER — Other Ambulatory Visit: Payer: Self-pay | Admitting: *Deleted

## 2012-09-20 DIAGNOSIS — E785 Hyperlipidemia, unspecified: Secondary | ICD-10-CM

## 2012-09-20 MED ORDER — SIMVASTATIN 40 MG PO TABS: ORAL_TABLET | ORAL | Status: DC

## 2012-09-20 NOTE — Telephone Encounter (Signed)
Rx sent 

## 2012-09-24 ENCOUNTER — Other Ambulatory Visit (INDEPENDENT_AMBULATORY_CARE_PROVIDER_SITE_OTHER): Payer: 59

## 2012-09-24 DIAGNOSIS — T887XXA Unspecified adverse effect of drug or medicament, initial encounter: Secondary | ICD-10-CM

## 2012-09-24 DIAGNOSIS — E785 Hyperlipidemia, unspecified: Secondary | ICD-10-CM

## 2012-09-24 LAB — HEPATIC FUNCTION PANEL
ALT: 35 U/L (ref 0–53)
AST: 28 U/L (ref 0–37)
Albumin: 4.2 g/dL (ref 3.5–5.2)
Alkaline Phosphatase: 55 U/L (ref 39–117)
Total Protein: 6.7 g/dL (ref 6.0–8.3)

## 2012-09-24 LAB — LIPID PANEL: Cholesterol: 165 mg/dL (ref 0–200)

## 2012-10-20 ENCOUNTER — Other Ambulatory Visit: Payer: Self-pay | Admitting: Internal Medicine

## 2012-10-20 ENCOUNTER — Other Ambulatory Visit: Payer: Self-pay | Admitting: Cardiovascular Disease

## 2012-11-19 ENCOUNTER — Encounter: Payer: Self-pay | Admitting: Internal Medicine

## 2012-11-19 ENCOUNTER — Ambulatory Visit (INDEPENDENT_AMBULATORY_CARE_PROVIDER_SITE_OTHER): Payer: 59 | Admitting: Internal Medicine

## 2012-11-19 VITALS — BP 128/74 | HR 55 | Temp 98.1°F | Resp 12 | Ht 70.0 in | Wt 219.0 lb

## 2012-11-19 DIAGNOSIS — I2510 Atherosclerotic heart disease of native coronary artery without angina pectoris: Secondary | ICD-10-CM

## 2012-11-19 DIAGNOSIS — H8110 Benign paroxysmal vertigo, unspecified ear: Secondary | ICD-10-CM

## 2012-11-19 DIAGNOSIS — I10 Essential (primary) hypertension: Secondary | ICD-10-CM

## 2012-11-19 DIAGNOSIS — Z0000 Encounter for general adult medical examination without abnormal findings: Secondary | ICD-10-CM

## 2012-11-19 DIAGNOSIS — E785 Hyperlipidemia, unspecified: Secondary | ICD-10-CM

## 2012-11-19 DIAGNOSIS — Z1331 Encounter for screening for depression: Secondary | ICD-10-CM

## 2012-11-19 HISTORY — DX: Benign paroxysmal vertigo, unspecified ear: H81.10

## 2012-11-19 MED ORDER — HYDROCHLOROTHIAZIDE 12.5 MG PO CAPS: 12.5000 mg | ORAL_CAPSULE | Freq: Every day | ORAL | Status: DC

## 2012-11-19 MED ORDER — METOPROLOL TARTRATE 25 MG PO TABS: 12.5000 mg | ORAL_TABLET | Freq: Two times a day (BID) | ORAL | Status: DC

## 2012-11-19 MED ORDER — ROSUVASTATIN CALCIUM 40 MG PO TABS: 40.0000 mg | ORAL_TABLET | Freq: Every day | ORAL | Status: DC

## 2012-11-19 NOTE — Patient Instructions (Addendum)
Please perform isometric exercises before going to bed. Sit on side of the bed and raise up on toes to a count of 5. Then put pressure on the heels to a count of 5. Repeat this process 10 times. This will improve blood flow to the calves & help prevent cramps.  Please review the medication list in the After Visit Summary provided.Please verify the medication name (this may be  brand or generic) & correct dosage. Write the name of the prescribing physician to the right of the medication and share this with all medical staff seen at each appointment. This will help provide continuity of care; help optimize therapeutic interventions;and help prevent drug:drug adverse reaction. Share results with all non [facility] medical staff seen   Fasting labs after 10 weeks of Crestor 40 mg daily. Avoid high fructose corn syrup as discussed

## 2012-11-19 NOTE — Progress Notes (Signed)
  Subjective:    Patient ID: George Alvarez, male    DOB: 02-22-1946, 67 y.o.   MRN: [id number]  HPI  He is here for a physical;acute issues include skin cancers  & intermittent BPV being treated in [place]. PT referral made; appt to be scheduled.     Review of Systems He is on a heart healthy diet; he exercises 30 minutes 3-4 times per week without symptoms. Specifically he denies chest pain, palpitations, dyspnea, or claudication.  Family history is positive for premature coronary disease in father & paternal uncle . Advanced cholesterol testing reveals his LDL goal was less than 100 ; but with CAD LDL goal is < 70.  He has been told he snores loudly. There's no history of apnea  He does have intermittent leg cramps which awaken him, on average 2-3 times per week    Objective:   Physical Exam Gen.:  well-nourished in appearance. Alert, appropriate and cooperative throughout exam.  Head: Normocephalic without obvious abnormalities;  pattern alopecia .Beard & moustache Eyes: No corneal or conjunctival inflammation noted.  Extraocular motion intact. Vision grossly normal with lenses Ears: External  ear exam reveals no significant lesions or deformities. Canals clear .TMs normal. Hearing is grossly normal bilaterally. Nose: External nasal exam reveals no deformity or inflammation. Nasal mucosa are pink and moist. No lesions or exudates noted.  Mouth: Oral mucosa and oropharynx reveal no lesions or exudates. Teeth in good repair. Neck: No deformities, masses, or tenderness noted. Range of motion decreased. Thyroid normal. Lungs: Normal respiratory effort; chest expands symmetrically. Lungs are clear to auscultation without rales, wheezes, or increased work of breathing. Heart: Slow rate and regular rhythm. Normal S1 and S2. No gallop, click, or rub. No murmur. Abdomen: Bowel sounds normal; abdomen soft and nontender. No masses, organomegaly or hernias noted. Genitalia: As per Dr Annabell Howells                                  Musculoskeletal/extremities: No significant deformity or scoliosis noted of  the thoracic or lumbar spine.  No clubbing, cyanosis, edema, or significant extremity  deformity noted. Range of motion normal .Tone & strength  Normal. Joints normal  ; minor knee crepitus. Nail health good. Able to lie down & sit up w/o help. Negative SLR bilaterally Vascular: Carotid, radial artery, dorsalis pedis and  posterior tibial pulses are full and equal. No bruits present. Neurologic: Alert and oriented x3. Deep tendon reflexes symmetrical and normal.        Skin: Multiple biopsy sites with eschar Lymph: No cervical, axillary lymphadenopathy present. Psych: Mood and affect are normal. Normally interactive                                                                                        Assessment & Plan:  #1 comprehensive physical exam; no acute findings  Plan: see Orders  & Recommendations

## 2012-12-09 ENCOUNTER — Ambulatory Visit: Payer: 59 | Admitting: Cardiovascular Disease

## 2012-12-16 ENCOUNTER — Other Ambulatory Visit: Payer: Self-pay | Admitting: Cardiovascular Disease

## 2013-01-04 ENCOUNTER — Ambulatory Visit: Payer: 59 | Admitting: Cardiovascular Disease

## 2013-02-04 ENCOUNTER — Ambulatory Visit (INDEPENDENT_AMBULATORY_CARE_PROVIDER_SITE_OTHER): Payer: 59 | Admitting: Cardiovascular Disease

## 2013-02-04 ENCOUNTER — Other Ambulatory Visit (INDEPENDENT_AMBULATORY_CARE_PROVIDER_SITE_OTHER): Payer: 59

## 2013-02-04 ENCOUNTER — Encounter: Payer: Self-pay | Admitting: Cardiovascular Disease

## 2013-02-04 VITALS — BP 142/88 | HR 68 | Ht 70.0 in | Wt 219.0 lb

## 2013-02-04 DIAGNOSIS — K219 Gastro-esophageal reflux disease without esophagitis: Secondary | ICD-10-CM

## 2013-02-04 DIAGNOSIS — E785 Hyperlipidemia, unspecified: Secondary | ICD-10-CM

## 2013-02-04 DIAGNOSIS — I2510 Atherosclerotic heart disease of native coronary artery without angina pectoris: Secondary | ICD-10-CM

## 2013-02-04 DIAGNOSIS — I10 Essential (primary) hypertension: Secondary | ICD-10-CM

## 2013-02-04 DIAGNOSIS — Z8249 Family history of ischemic heart disease and other diseases of the circulatory system: Secondary | ICD-10-CM

## 2013-02-04 LAB — BASIC METABOLIC PANEL
BUN: 13 mg/dL (ref 6–23)
Calcium: 9.3 mg/dL (ref 8.4–10.5)
Creatinine, Ser: 1 mg/dL (ref 0.4–1.5)
GFR: 77.92 mL/min (ref >60.00)
Glucose, Bld: 115 mg/dL — ABNORMAL HIGH (ref 70–99)
Potassium: 4 mEq/L (ref 3.5–5.1)

## 2013-02-04 LAB — LIPID PANEL
Total CHOL/HDL Ratio: 3
Triglycerides: 149 mg/dL (ref 0.0–149.0)

## 2013-02-04 LAB — HEPATIC FUNCTION PANEL
Albumin: 4.4 g/dL (ref 3.5–5.2)
Alkaline Phosphatase: 54 U/L (ref 39–117)
Total Protein: 6.9 g/dL (ref 6.0–8.3)

## 2013-02-04 LAB — CK: Total CK: 249 U/L — ABNORMAL HIGH (ref 7–232)

## 2013-02-04 MED ORDER — NITROGLYCERIN 0.4 MG SL SUBL: 0.4000 mg | SUBLINGUAL_TABLET | SUBLINGUAL | Status: DC | PRN

## 2013-02-04 MED ORDER — CLOPIDOGREL BISULFATE 75 MG PO TABS: 75.0000 mg | ORAL_TABLET | Freq: Every day | ORAL | Status: DC

## 2013-02-04 MED ORDER — PANTOPRAZOLE SODIUM 40 MG PO TBEC: 40.0000 mg | DELAYED_RELEASE_TABLET | Freq: Every day | ORAL | Status: DC

## 2013-02-04 NOTE — Patient Instructions (Addendum)
Your physician wants you to follow-up in:  6 months. You will receive a reminder letter in the mail two months in advance. If you don't receive a letter, please call our office to schedule the follow-up appointment.  Your physician has recommended you make the following change in your medication: Stop Effient. Start Clopidogrel 75 mg by mouth daily Stop Nexium. Start Protonix 40 mg by mouth daily

## 2013-02-04 NOTE — Progress Notes (Addendum)
\   History of Present Illness: 67 yo male with history of HTN, HLD, CAD who is here today for follow up. He underwent a stress test on 02/05/12 which was suggestive of ischemia. Cardiac cath on 02/06/12 with 95% proximal OM2 lesion and a 60% mid RCA lesion. EF was 65%. His OM2 was very large in caliber and was treated with bare-metal stents x 2. Medical management of RCA stenosis which was felt to be moderate. Patient was placed on Effient only due to his history of intolerance to aspirin.   He is here today for follow up. Denies chest pain, shortness of breath, syncope, orthopnea, PND or edema. He has not been exercising but he walks 2-3 miles per day at work.   Primary Care Physician: Marga Melnick  Last Lipid Profile:Lipid Panel     Component Value Date/Time   CHOL 165 09/24/2012 0822   TRIG 233.0* 09/24/2012 0822   HDL 34.20* 09/24/2012 0822   CHOLHDL 5 09/24/2012 0822   VLDL 46.6* 09/24/2012 0822   LDLCALC 70 05/09/2011 0946     Past Medical History  Diagnosis Date  . Hyperlipidemia   . GERD (gastroesophageal reflux disease)   . Perennial allergic rhinitis   . Asthma     post infectious; envirronmental triggers  . Coronary artery disease     a. 02/2012 Cath/PCI: LM 10, LAD min irregs, LCX large, OM1 sm, 40 ost, OM2 95p (5.0x16 Veriflex & 4.5x12 Veriflex BMS'), RCA 30p, 72m, 30d, PDA/PLA min irregs, EF 65%  . Hypertension   . Skin cancer     Basal and squamous cell cancers, greater than 20    Past Surgical History  Procedure Laterality Date  . Septoplasty    . Hernia repair    . Vasectomy    . Tonsillectomy and adenoidectomy    . Trigger finger release    . Injection knee    . Epidural steroids  2007, 2009    X 2 @ cervical &, lumbar)  . Colonoscopy  2003, 2011    negative  . Upper gastrointestinal endoscopy  2011    gastric polyps  . Coronary angioplasty with stent placement  02/06/2012    OM2  bare metal     Current Outpatient Prescriptions  Medication Sig  Dispense Refill  . albuterol (PROVENTIL HFA;VENTOLIN HFA) 108 (90 BASE) MCG/ACT inhaler Inhale 2 puffs into the lungs as needed. For wheezing      . beclomethasone (QVAR) 80 MCG/ACT inhaler Inhale 1 puff into the lungs 2 (two) times daily.       . Chlorpheniramine Maleate ([facility] ALLERGY PO) Take 1 tablet by mouth daily.       Marland Kitchen EFFIENT 10 MG TABS tablet TAKE 1 TABLET BY MOUTH EVERY DAY  30 tablet  12  . esomeprazole (NEXIUM) 40 MG capsule Take 40 mg by mouth daily before breakfast.      . hydrochlorothiazide (MICROZIDE) 12.5 MG capsule Take 1 capsule (12.5 mg total) by mouth daily.  90 capsule  1  . metoprolol tartrate (LOPRESSOR) 25 MG tablet Take 0.5 tablets (12.5 mg total) by mouth 2 (two) times daily.  180 tablet  3  . mometasone (NASONEX) 50 MCG/ACT nasal spray Place 2 sprays into the nose daily.      . montelukast (SINGULAIR) 10 MG tablet Take 10 mg by mouth at bedtime.      . nitroGLYCERIN (NITROSTAT) 0.4 MG SL tablet Place 1 tablet (0.4 mg total) under the tongue every 5 (five) minutes  as needed for chest pain.  25 tablet  3  . rosuvastatin (CRESTOR) 40 MG tablet Take 1 tablet (40 mg total) by mouth daily.  90 tablet  0  . VIAGRA 100 MG tablet prn       No current facility-administered medications for this visit.    Allergies  Allergen Reactions  . Aspirin     Angioedema in 1980s Medi Alert bracelet  is recommended  . Nifedipine Rash    edema    History   Social History  . Marital Status: Married    Spouse Name: N/A    Number of Children: N/A  . Years of Education: N/A   Occupational History  . Not on file.   Social History Main Topics  . Smoking status: Never Smoker   . Smokeless tobacco: Never Used  . Alcohol Use: Yes     Comment: Wine occasionally  . Drug Use: No  . Sexual Activity: Not on file   Other Topics Concern  . Not on file   Social History Narrative  . No narrative on file    Family History  Problem Relation Age of Onset  . Heart attack Father  4    died @ 70  . Heart disease Mother   . Breast cancer Mother   . Esophageal cancer Mother     Barrett's  . Heart attack Maternal Grandmother     after 65  . Dementia Maternal Grandmother     CVA  . Breast cancer Maternal Grandmother   . Diabetes Maternal Grandmother   . Stroke Maternal Grandmother     in 65s  . Heart attack Paternal Uncle 13  . Breast cancer Maternal Aunt     Review of Systems:  As stated in the HPI and otherwise negative.   BP 142/88  Pulse 68  Ht 5\' 10"  (1.778 m)  Wt 219 lb (99.338 kg)  BMI 31.42 kg/m2  Physical Examination: General: Well developed, well nourished, NAD HEENT: OP clear, mucus membranes moist SKIN: warm, dry. No rashes. Neuro: No focal deficits Musculoskeletal: Muscle strength 5/5 all ext Psychiatric: Mood and affect normal Neck: No JVD, no carotid bruits, no thyromegaly, no lymphadenopathy. Lungs:Clear bilaterally, no wheezes, rhonci, crackles Cardiovascular: Regular rate and rhythm. No murmurs, gallops or rubs. Abdomen:Soft. Bowel sounds present. Non-tender.  Extremities: No lower extremity edema. Pulses are 2 + in the bilateral DP/PT.  Cardiac cath 02/06/12: Left main: 10% distal stenosis.  Left Anterior Descending Artery: Large caliber vessel that courses to the apex with mild luminal irregularities proximal and distal.  Circumflex Artery: Very large caliber vessel. The first OM is small and has an ostial 40% stenosis. The second OM is very large (5.18mm) and has a 95% stenosis in the proximal portion of the vessel.  Right Coronary Artery: Large, dominant artery with 30% proximal stenosis, 60% mid stenosis, diffuse 30% distal disease. The PDA and PLA are patent with mild plaque disease.  Left Ventricular Angiogram: LVEF=65%  Impression:  1. Double vessel CAD  2. Unstable angina  3. Normal LV systolic function  4. Successful PTCA/bare metal stent x 1 OM2.   EKG: NSR, LAFB. Rate  64 bpm. LAD.   Assessment and Plan:   1. CAD:  Stable. Will change Effient to Plavix. He is intolerant of ASA. Continue beta blocker and statin. Lipids and LFTs pending today.   2. HTN: BP is borderline elevated but well controlled at home per pt. No changes today. He will follow at home  and let us know if it is not well controlled.   3. GERD: Change Nexium to Protonix since we are starting Plavix.

## 2013-02-05 ENCOUNTER — Other Ambulatory Visit: Payer: Self-pay | Admitting: Internal Medicine

## 2013-02-05 DIAGNOSIS — R739 Hyperglycemia, unspecified: Secondary | ICD-10-CM | POA: Insufficient documentation

## 2013-02-05 DIAGNOSIS — R7309 Other abnormal glucose: Secondary | ICD-10-CM

## 2013-02-10 ENCOUNTER — Other Ambulatory Visit: Payer: Self-pay

## 2013-03-05 ENCOUNTER — Encounter: Payer: Self-pay | Admitting: Cardiovascular Disease

## 2013-03-07 ENCOUNTER — Other Ambulatory Visit: Payer: Self-pay | Admitting: *Deleted

## 2013-03-07 DIAGNOSIS — I2510 Atherosclerotic heart disease of native coronary artery without angina pectoris: Secondary | ICD-10-CM

## 2013-03-07 MED ORDER — PRASUGREL HCL 10 MG PO TABS: 10.0000 mg | ORAL_TABLET | Freq: Every day | ORAL | Status: DC

## 2013-03-25 ENCOUNTER — Other Ambulatory Visit: Payer: Self-pay | Admitting: Internal Medicine

## 2013-03-25 NOTE — Telephone Encounter (Signed)
Crestor refilled per protocol. JG//CMA 

## 2013-06-13 ENCOUNTER — Other Ambulatory Visit: Payer: Self-pay | Admitting: *Deleted

## 2013-06-13 DIAGNOSIS — I10 Essential (primary) hypertension: Secondary | ICD-10-CM

## 2013-06-13 MED ORDER — HYDROCHLOROTHIAZIDE 12.5 MG PO CAPS: 12.5000 mg | ORAL_CAPSULE | Freq: Every day | ORAL | Status: DC

## 2013-06-13 NOTE — Telephone Encounter (Signed)
Rx sent to the pharmacy by e-script.  Pt aware.//AB/CMA 

## 2013-08-05 ENCOUNTER — Ambulatory Visit (INDEPENDENT_AMBULATORY_CARE_PROVIDER_SITE_OTHER): Payer: Managed Care, Other (non HMO) | Admitting: Cardiovascular Disease

## 2013-08-05 ENCOUNTER — Encounter: Payer: Self-pay | Admitting: Cardiovascular Disease

## 2013-08-05 VITALS — BP 140/80 | HR 63 | Ht 70.0 in | Wt 230.0 lb

## 2013-08-05 DIAGNOSIS — K219 Gastro-esophageal reflux disease without esophagitis: Secondary | ICD-10-CM

## 2013-08-05 DIAGNOSIS — I2510 Atherosclerotic heart disease of native coronary artery without angina pectoris: Secondary | ICD-10-CM

## 2013-08-05 DIAGNOSIS — I10 Essential (primary) hypertension: Secondary | ICD-10-CM

## 2013-08-05 NOTE — Patient Instructions (Signed)
Your physician wants you to follow-up in:  6 months. You will receive a reminder letter in the mail two months in advance. If you don't receive a letter, please call our office to schedule the follow-up appointment.   

## 2013-08-05 NOTE — Progress Notes (Signed)
\    History of Present Illness: 68 yo male with history of HTN, HLD, CAD who is here today for follow up. He underwent a stress test on 02/05/12 which was suggestive of ischemia. Cardiac cath on 02/06/12 with 95% proximal OM2 lesion and a 60% mid RCA lesion. EF was 65%. His OM2 was very large in caliber and was treated with bare-metal stents x 2. Medical management of RCA stenosis which was felt to be moderate. Patient was placed on Effient only due to his history of intolerance to aspirin. He tried switching to Plavix and Protonix but he did not like taking the Protonix. It did not help his GERD  He is here today for follow up. Denies chest pain, shortness of breath, syncope, orthopnea, PND or edema. He has not been exercising .   Primary Care Physician: Unice Cobble  Last Lipid Profile:Lipid Panel     Component Value Date/Time   CHOL 120 02/04/2013 0806   TRIG 149.0 02/04/2013 0806   HDL 37.10* 02/04/2013 0806   CHOLHDL 3 02/04/2013 0806   VLDL 29.8 02/04/2013 0806   LDLCALC 53 02/04/2013 0806   Past Medical History  Diagnosis Date  . Hyperlipidemia   . GERD (gastroesophageal reflux disease)   . Perennial allergic rhinitis   . Asthma     post infectious; envirronmental triggers  . Coronary artery disease     a. 02/2012 Cath/PCI: LM 10, LAD min irregs, LCX large, OM1 sm, 40 ost, OM2 95p (5.0x16 Veriflex & 4.5x12 Veriflex BMS'), RCA 30p, 34m, 30d, PDA/PLA min irregs, EF 65%  . Hypertension   . Skin cancer     Basal and squamous cell cancers, greater than 20    Past Surgical History  Procedure Laterality Date  . Septoplasty    . Hernia repair    . Vasectomy    . Tonsillectomy and adenoidectomy    . Trigger finger release    . Injection knee    . Epidural steroids  2007, 2009    X 2 @ cervical &, lumbar)  . Colonoscopy  2003, 2011    negative  . Upper gastrointestinal endoscopy  2011    gastric polyps  . Coronary angioplasty with stent placement  02/06/2012    OM2   bare metal     Current Outpatient Prescriptions  Medication Sig Dispense Refill  . albuterol (PROVENTIL HFA;VENTOLIN HFA) 108 (90 BASE) MCG/ACT inhaler Inhale 2 puffs into the lungs as needed. For wheezing      . beclomethasone (QVAR) 80 MCG/ACT inhaler Inhale 1 puff into the lungs 2 (two) times daily.       . Chlorpheniramine Maleate ([facility] ALLERGY PO) Take 1 tablet by mouth daily.       . CRESTOR 40 MG tablet TAKE 1 TABLET BY MOUTH EVERY DAY  90 tablet  1  . esomeprazole (NEXIUM) 40 MG capsule Take 40 mg by mouth daily at 12 noon. OVER THE COUNTER      . hydrochlorothiazide (MICROZIDE) 12.5 MG capsule Take 1 capsule (12.5 mg total) by mouth daily.  90 capsule  1  . metoprolol tartrate (LOPRESSOR) 25 MG tablet Take 0.5 tablets (12.5 mg total) by mouth 2 (two) times daily.  180 tablet  3  . mometasone (NASONEX) 50 MCG/ACT nasal spray Place 2 sprays into the nose daily.      . montelukast (SINGULAIR) 10 MG tablet Take 10 mg by mouth at bedtime.      . nitroGLYCERIN (NITROSTAT) 0.4 MG SL  tablet Place 1 tablet (0.4 mg total) under the tongue every 5 (five) minutes as needed for chest pain.  25 tablet  6  . prasugrel (EFFIENT) 10 MG TABS tablet Take 1 tablet (10 mg total) by mouth daily.  90 tablet  3  . VIAGRA 100 MG tablet prn       No current facility-administered medications for this visit.    Allergies  Allergen Reactions  . Aspirin     Angioedema in 1980s Medi Alert bracelet  is recommended  . Nifedipine Rash    edema    History   Social History  . Marital Status: Married    Spouse Name: N/A    Number of Children: N/A  . Years of Education: N/A   Occupational History  . Not on file.   Social History Main Topics  . Smoking status: Never Smoker   . Smokeless tobacco: Never Used  . Alcohol Use: Yes     Comment: Wine occasionally  . Drug Use: No  . Sexual Activity: Not on file   Other Topics Concern  . Not on file   Social History Narrative  . No narrative on file     Family History  Problem Relation Age of Onset  . Heart attack Father 28    died @ 8  . Heart disease Mother   . Breast cancer Mother   . Esophageal cancer Mother     Barrett's  . Heart attack Maternal Grandmother     after 65  . Dementia Maternal Grandmother     CVA  . Breast cancer Maternal Grandmother   . Diabetes Maternal Grandmother   . Stroke Maternal Grandmother     in 61s  . Heart attack Paternal Uncle 63  . Breast cancer Maternal Aunt     Review of Systems:  As stated in the HPI and otherwise negative.   BP 140/80  Pulse 63  Ht 5\' 10"  (1.778 m)  Wt 230 lb (104.327 kg)  BMI 33.00 kg/m2  Physical Examination: General: Well developed, well nourished, NAD HEENT: OP clear, mucus membranes moist SKIN: warm, dry. No rashes. Neuro: No focal deficits Musculoskeletal: Muscle strength 5/5 all ext Psychiatric: Mood and affect normal Neck: No JVD, no carotid bruits, no thyromegaly, no lymphadenopathy. Lungs:Clear bilaterally, no wheezes, rhonci, crackles Cardiovascular: Regular rate and rhythm. No murmurs, gallops or rubs. Abdomen:Soft. Bowel sounds present. Non-tender.  Extremities: No lower extremity edema. Pulses are 2 + in the bilateral DP/PT.  Cardiac cath 02/06/12: Left main: 10% distal stenosis.  Left Anterior Descending Artery: Large caliber vessel that courses to the apex with mild luminal irregularities proximal and distal.  Circumflex Artery: Very large caliber vessel. The first OM is small and has an ostial 40% stenosis. The second OM is very large (5.58mm) and has a 95% stenosis in the proximal portion of the vessel.  Right Coronary Artery: Large, dominant artery with 30% proximal stenosis, 60% mid stenosis, diffuse 30% distal disease. The PDA and PLA are patent with mild plaque disease.  Left Ventricular Angiogram: LVEF=65%  Impression:  1. Double vessel CAD  2. Unstable angina  3. Normal LV systolic function  4. Successful PTCA/bare metal stent x 1  OM2.   Assessment and Plan:   1. CAD: Stable. Continue Effient. He is intolerant of ASA. Continue beta blocker and statin.   2. HTN: BP is controlled. No changes.   3. GERD: Continue Nexium once daily.

## 2013-10-25 ENCOUNTER — Other Ambulatory Visit: Payer: Self-pay | Admitting: Cardiovascular Disease

## 2013-11-28 ENCOUNTER — Telehealth: Payer: Self-pay

## 2013-11-28 MED ORDER — METOPROLOL TARTRATE 25 MG PO TABS: 12.5000 mg | ORAL_TABLET | Freq: Two times a day (BID) | ORAL | Status: DC

## 2013-11-28 NOTE — Telephone Encounter (Signed)
Metoprolol filled for #30 since office visit is due

## 2013-12-02 ENCOUNTER — Encounter: Payer: Self-pay | Admitting: Gastroenterology

## 2013-12-09 ENCOUNTER — Encounter: Payer: Managed Care, Other (non HMO) | Admitting: Internal Medicine

## 2014-01-04 ENCOUNTER — Telehealth: Payer: Self-pay | Admitting: Internal Medicine

## 2014-01-04 MED ORDER — METOPROLOL TARTRATE 25 MG PO TABS: 12.5000 mg | ORAL_TABLET | Freq: Two times a day (BID) | ORAL | Status: DC

## 2014-01-04 NOTE — Telephone Encounter (Signed)
Pt request refill for metoprolol 25 mg. Please send this to [facility] if possible.

## 2014-01-04 NOTE — Telephone Encounter (Signed)
rx sent x19month since patient is due for an office visit. Office visit needed for further refills

## 2014-01-30 ENCOUNTER — Other Ambulatory Visit: Payer: Self-pay

## 2014-01-30 DIAGNOSIS — I10 Essential (primary) hypertension: Secondary | ICD-10-CM

## 2014-01-30 MED ORDER — HYDROCHLOROTHIAZIDE 12.5 MG PO CAPS: 12.5000 mg | ORAL_CAPSULE | Freq: Every day | ORAL | Status: DC

## 2014-01-30 MED ORDER — METOPROLOL TARTRATE 25 MG PO TABS: 12.5000 mg | ORAL_TABLET | Freq: Two times a day (BID) | ORAL | Status: DC

## 2014-02-15 ENCOUNTER — Encounter: Payer: Self-pay | Admitting: Internal Medicine

## 2014-02-20 ENCOUNTER — Ambulatory Visit (INDEPENDENT_AMBULATORY_CARE_PROVIDER_SITE_OTHER): Payer: Managed Care, Other (non HMO) | Admitting: Internal Medicine

## 2014-02-20 ENCOUNTER — Encounter: Payer: Self-pay | Admitting: Internal Medicine

## 2014-02-20 VITALS — BP 149/81 | HR 74 | Temp 98.3°F | Ht 70.0 in | Wt 228.4 lb

## 2014-02-20 DIAGNOSIS — R7303 Prediabetes: Secondary | ICD-10-CM

## 2014-02-20 DIAGNOSIS — J4520 Mild intermittent asthma, uncomplicated: Secondary | ICD-10-CM

## 2014-02-20 DIAGNOSIS — Z85828 Personal history of other malignant neoplasm of skin: Secondary | ICD-10-CM

## 2014-02-20 DIAGNOSIS — Z0000 Encounter for general adult medical examination without abnormal findings: Secondary | ICD-10-CM | POA: Insufficient documentation

## 2014-02-20 DIAGNOSIS — I10 Essential (primary) hypertension: Secondary | ICD-10-CM

## 2014-02-20 DIAGNOSIS — R413 Other amnesia: Secondary | ICD-10-CM

## 2014-02-20 DIAGNOSIS — Z23 Encounter for immunization: Secondary | ICD-10-CM

## 2014-02-20 DIAGNOSIS — H8110 Benign paroxysmal vertigo, unspecified ear: Secondary | ICD-10-CM

## 2014-02-20 MED ORDER — BECLOMETHASONE DIPROPIONATE 80 MCG/ACT IN AERS: 1.0000 | INHALATION_SPRAY | Freq: Two times a day (BID) | RESPIRATORY_TRACT | Status: DC

## 2014-02-20 MED ORDER — LOSARTAN POTASSIUM-HCTZ 50-12.5 MG PO TABS: 1.0000 | ORAL_TABLET | Freq: Every day | ORAL | Status: DC

## 2014-02-20 MED ORDER — MOMETASONE FUROATE 50 MCG/ACT NA SUSP: 2.0000 | Freq: Every day | NASAL | Status: DC

## 2014-02-20 NOTE — Assessment & Plan Note (Signed)
Benign positional vertigo by history,ongoing symptoms, would like another opinion, refer to neurology

## 2014-02-20 NOTE — Progress Notes (Signed)
Subjective:    Patient ID: George Alvarez, male    DOB: 1946-02-15, 68 y.o.   MRN: [id number]  DOS:  02/20/2014 Type of visit - description : new patient to me , here for a physical exam Interval history: In addition to his physical we discussed a number of other problems Dizziness, 2 years history of dizziness, he gets dizzy described as spinning sometimes associated with nausea whenever he laid down to play with his grandchildren or when he turns in bed. Also back in April 2015 for one hour he felt he couldn't remember many things including his grandchildren names. ths this happened while he was in [place], no witnesses. Does not recall having double vision or motor deficits.  Hypertension, BP today 149/81, BP also elevated at other office visits.   ROS No  CP, SOB Denies  nausea, vomiting diarrhea, blood in the stools (-) cough, sputum production (-) wheezing, chest congestion No anxiety, depression    Past Medical History  Diagnosis Date  . Hyperlipidemia   . GERD (gastroesophageal reflux disease)   . Perennial allergic rhinitis   . Asthma     post infectious; envirronmental triggers  . Coronary artery disease     a. 02/2012 Cath/PCI: LM 10, LAD min irregs, LCX large, OM1 sm, 40 ost, OM2 95p (5.0x16 Veriflex & 4.5x12 Veriflex BMS'), RCA 30p, 53m, 30d, PDA/PLA min irregs, EF 65%  . Hypertension   . Skin cancer     Basal and squamous cell cancers, greater than 20  . BPH (benign prostatic hyperplasia)   . Asthma, chronic 06/16/2007    Qualifier: Diagnosis of  By: Linna Darner MD, Gwyndolyn Saxon   Onset:as child Triggers (environmental, infectious, allergic): all, mainly environmental triggers Rescue inhaler FBP:ZWCHEN Maintenance medications/ response:Singulair,Qvar Smoking history:never Family history pulmonary disease: no    . Benign paroxysmal positional vertigo 11/19/2012    Diagnosed at [facility], [place]  Physical therapy appointment pending     Past Surgical History    Procedure Laterality Date  . Septoplasty    . Hernia repair    . Vasectomy    . Tonsillectomy and adenoidectomy    . Trigger finger release    . Epidural steroids  2007, 2009    X 2 @ cervical &, lumbar)  . Colonoscopy  2003, 2011    negative  . Upper gastrointestinal endoscopy  2011    gastric polyps  . Coronary angioplasty with stent placement  02/06/2012    OM2  bare metal     History   Social History  . Marital Status: Married    Spouse Name: N/A    Number of Children: 3  . Years of Education: N/A   Occupational History  . 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 Smoking status: Never Smoker   . Smokeless tobacco: Never Used  . Alcohol Use: Yes     Comment: Wine occasionally  . Drug Use: No  . Sexual Activity: Not on file   Other Topics Concern  . Not on file   Social History Narrative   3 daughters, 49 g-children   lifes w/ wife in [place]     Family History  Problem Relation Age of Onset  . Heart attack Father 37    died @ 90  . Heart disease Mother   . Breast cancer Mother   . Esophageal cancer Mother     Barrett's  . Heart attack Maternal Grandmother  after 65  . Dementia Maternal Grandmother     CVA  . Breast cancer Maternal Grandmother   . Diabetes Maternal Grandmother   . Stroke Maternal Grandmother     in 37s  . Heart attack Paternal Uncle 78  . Breast cancer Maternal Aunt   . Colon cancer Neg Hx   . Prostate cancer Neg Hx        Medication List       This list is accurate as of: 02/20/14  5:59 PM.  Always use your most recent med list.               albuterol 108 (90 BASE) MCG/ACT inhaler  Commonly known as:  PROVENTIL HFA;VENTOLIN HFA  Inhale 2 puffs into the lungs as needed. For wheezing     beclomethasone 80 MCG/ACT inhaler  Commonly known as:  QVAR  Inhale 1 puff into the lungs 2 (two) times daily.     CRESTOR 40 MG tablet  Generic drug:  rosuvastatin  TAKE 1 TABLET BY MOUTH  EVERY DAY     [employer] ALLERGY PO  Take 1 tablet by mouth daily.     EFFIENT 10 MG Tabs tablet  Generic drug:  prasugrel  TAKE 1 TABLET BY MOUTH DAILY     esomeprazole 40 MG capsule  Commonly known as:  NEXIUM  Take 40 mg by mouth daily at 12 noon. OVER THE COUNTER     losartan-hydrochlorothiazide 50-12.5 MG per tablet  Commonly known as:  HYZAAR  Take 1 tablet by mouth daily.     metoprolol tartrate 25 MG tablet  Commonly known as:  LOPRESSOR  Take 0.5 tablets (12.5 mg total) by mouth 2 (two) times daily.     mometasone 50 MCG/ACT nasal spray  Commonly known as:  NASONEX  Place 2 sprays into the nose daily.     montelukast 10 MG tablet  Commonly known as:  SINGULAIR  Take 10 mg by mouth at bedtime.     nitroGLYCERIN 0.4 MG SL tablet  Commonly known as:  NITROSTAT  Place 1 tablet (0.4 mg total) under the tongue every 5 (five) minutes as needed for chest pain.     tadalafil 5 MG tablet  Commonly known as:  CIALIS  Take 5 mg by mouth daily as needed for erectile dysfunction.           Objective:   Physical Exam BP 149/81 mmHg  Pulse 74  Temp(Src) 98.3 F (36.8 C) (Oral)  Ht 5\' 10"  (1.778 m)  Wt 228 lb 6 oz (103.59 kg)  BMI 32.77 kg/m2  SpO2 96% General -- alert, well-developed, NAD.  Neck --no thyromegaly , normal carotid pulse, no bruit  HEENT-- Not pale.   Lungs -- normal respiratory effort, no intercostal retractions, no accessory muscle use, and normal breath sounds.  Heart-- normal rate, regular rhythm, no murmur.  Abdomen-- Not distended, good bowel sounds,soft, non-tender. No rebound or rigidity.   Extremities-- no pretibial edema bilaterally  Neurologic--  alert & oriented X3. Speech normal, gait appropriate for age, strength symmetric and appropriate for age.  Psych-- Cognition and judgment appear intact. Cooperative with normal attention span and concentration. No anxious or depressed appearing.     Assessment & Plan:    CAD, follow-up by  cardiology, currently asymptomatic.allergic to aspirin, on Effient

## 2014-02-20 NOTE — Assessment & Plan Note (Signed)
History of skin cancer, sees Dr. Waverly Ferrari regularly

## 2014-02-20 NOTE — Assessment & Plan Note (Signed)
  Td 2010 although the patient reports have another one 06-2013 Had a flu shot Pneumonia shot 2013 Prevnar today Zostavax 2013   PSA, DRE: Sees Dr. Jeffie Pollock routinely  Colonoscopy in 2010,  Next is due next year, refer to GI.  Patient wonders if he also needs an EGD and I will leave that to the discretion of GI

## 2014-02-20 NOTE — Assessment & Plan Note (Signed)
Transient amnesia April 2015, refer to neurology

## 2014-02-20 NOTE — Assessment & Plan Note (Addendum)
Asthma, follow-up by his allergies, good medication compliance. Request a refill on his medications

## 2014-02-20 NOTE — Addendum Note (Signed)
Addended by: Kathlene November E on: 02/20/2014 06:11 PM   Modules accepted: Orders, SmartSet

## 2014-02-20 NOTE — Patient Instructions (Signed)
Stop hydrochlorothiazide and start losartan HCT 1 tablet daily in the mornings .  Check the  blood pressure 2 or 3 times a week  Be sure your blood pressure is between  145/85  and 110/65.  if it is consistently higher or lower, let me know   Come back to 3 weeks from today for blood work only: FLP, CMP, CBC, A1c, TSH --dx V70  Next visit with me in 4 months

## 2014-02-20 NOTE — Progress Notes (Signed)
Pre visit review using our clinic review tool, if applicable. No additional management support is needed unless otherwise documented below in the visit note. 

## 2014-02-20 NOTE — Assessment & Plan Note (Signed)
Mild hyperglycemia, A1c 5.18 2013, check A1c

## 2014-02-20 NOTE — Assessment & Plan Note (Signed)
Hypertension, BP today is 149/81,   BPs also elevated at other office visits. Plan:discontinue HCTZ, start losartan HCT, labs in 2 weeks

## 2014-03-09 ENCOUNTER — Encounter: Payer: Self-pay | Admitting: Gastroenterology

## 2014-03-13 ENCOUNTER — Other Ambulatory Visit (INDEPENDENT_AMBULATORY_CARE_PROVIDER_SITE_OTHER): Payer: Managed Care, Other (non HMO)

## 2014-03-13 DIAGNOSIS — Z0000 Encounter for general adult medical examination without abnormal findings: Secondary | ICD-10-CM

## 2014-03-13 LAB — LIPID PANEL
Cholesterol: 119 mg/dL (ref 0–200)
HDL: 32.1 mg/dL — ABNORMAL LOW (ref >39.00)
LDL Cholesterol: 53 mg/dL (ref 0–99)
NONHDL: 86.9
Total CHOL/HDL Ratio: 4
Triglycerides: 168 mg/dL — ABNORMAL HIGH (ref 0.0–149.0)
VLDL: 33.6 mg/dL (ref 0.0–40.0)

## 2014-03-13 LAB — COMPREHENSIVE METABOLIC PANEL
ALBUMIN: 4.2 g/dL (ref 3.5–5.2)
ALT: 27 U/L (ref 0–53)
AST: 29 U/L (ref 0–37)
Alkaline Phosphatase: 55 U/L (ref 39–117)
BILIRUBIN TOTAL: 1 mg/dL (ref 0.2–1.2)
BUN: 22 mg/dL (ref 6–23)
CO2: 28 meq/L (ref 19–32)
Calcium: 9.6 mg/dL (ref 8.4–10.5)
Chloride: 105 mEq/L (ref 96–112)
Creatinine, Ser: 1.3 mg/dL (ref 0.4–1.5)
GFR: 58.18 mL/min — AB (ref >60.00)
Glucose, Bld: 113 mg/dL — ABNORMAL HIGH (ref 70–99)
Potassium: 4.6 mEq/L (ref 3.5–5.1)
SODIUM: 140 meq/L (ref 135–145)
Total Protein: 6.7 g/dL (ref 6.0–8.3)

## 2014-03-13 LAB — CBC WITH DIFFERENTIAL/PLATELET
Basophils Absolute: 0 10*3/uL (ref 0.0–0.1)
Basophils Relative: 0.5 % (ref 0.0–3.0)
EOS PCT: 2.3 % (ref 0.0–5.0)
Eosinophils Absolute: 0.2 10*3/uL (ref 0.0–0.7)
HCT: 45.5 % (ref 39.0–52.0)
Hemoglobin: 14.9 g/dL (ref 13.0–17.0)
LYMPHS PCT: 25.8 % (ref 12.0–46.0)
Lymphs Abs: 1.9 10*3/uL (ref 0.7–4.0)
MCHC: 32.7 g/dL (ref 30.0–36.0)
MCV: 87.3 fl (ref 78.0–100.0)
MONOS PCT: 7.8 % (ref 3.0–12.0)
Monocytes Absolute: 0.6 10*3/uL (ref 0.1–1.0)
NEUTROS PCT: 63.6 % (ref 43.0–77.0)
Neutro Abs: 4.6 10*3/uL (ref 1.4–7.7)
PLATELETS: 147 10*3/uL — AB (ref 150.0–400.0)
RBC: 5.21 Mil/uL (ref 4.22–5.81)
RDW: 13.4 % (ref 11.5–15.5)
WBC: 7.2 10*3/uL (ref 4.0–10.5)

## 2014-03-13 LAB — HEMOGLOBIN A1C: HEMOGLOBIN A1C: 6.5 % (ref 4.6–6.5)

## 2014-03-13 LAB — TSH: TSH: 3.65 u[IU]/mL (ref 0.35–4.50)

## 2014-03-16 ENCOUNTER — Encounter: Payer: Self-pay | Admitting: Internal Medicine

## 2014-03-16 ENCOUNTER — Other Ambulatory Visit: Payer: Self-pay

## 2014-03-16 ENCOUNTER — Encounter (HOSPITAL_COMMUNITY): Payer: Self-pay | Admitting: Cardiovascular Disease

## 2014-03-16 MED ORDER — METOPROLOL TARTRATE 25 MG PO TABS: 12.5000 mg | ORAL_TABLET | Freq: Two times a day (BID) | ORAL | Status: DC

## 2014-03-20 ENCOUNTER — Other Ambulatory Visit: Payer: Self-pay | Admitting: Internal Medicine

## 2014-03-20 ENCOUNTER — Other Ambulatory Visit: Payer: Self-pay

## 2014-03-23 ENCOUNTER — Encounter: Payer: Self-pay | Admitting: Cardiovascular Disease

## 2014-03-23 ENCOUNTER — Ambulatory Visit (INDEPENDENT_AMBULATORY_CARE_PROVIDER_SITE_OTHER): Payer: Managed Care, Other (non HMO) | Admitting: Cardiovascular Disease

## 2014-03-23 VITALS — BP 142/92 | HR 54 | Ht 70.0 in | Wt 228.4 lb

## 2014-03-23 DIAGNOSIS — E785 Hyperlipidemia, unspecified: Secondary | ICD-10-CM

## 2014-03-23 DIAGNOSIS — I2510 Atherosclerotic heart disease of native coronary artery without angina pectoris: Secondary | ICD-10-CM

## 2014-03-23 DIAGNOSIS — I10 Essential (primary) hypertension: Secondary | ICD-10-CM

## 2014-03-23 NOTE — Patient Instructions (Signed)
Your physician wants you to follow-up in:  6 months.  You will receive a reminder letter in the mail two months in advance. If you don't receive a letter, please call our office to schedule the follow-up appointment.  Your physician has requested that you have an exercise stress myoview. For further information please visit [email or web address]. Please follow instruction sheet, as given.  

## 2014-03-23 NOTE — Progress Notes (Signed)
\    History of Present Illness: 68 yo male with history of HTN, HLD, CAD who is here today for follow up. He underwent a stress test on 02/05/12 which was suggestive of ischemia. Cardiac cath on 02/06/12 with 95% proximal OM2 lesion and a 60% mid RCA lesion. EF was 65%. His OM2 was very large in caliber and was treated with bare-metal stents x 2. Medical management of RCA stenosis which was felt to be moderate. Patient was placed on Effient only due to his history of intolerance to aspirin. He tried switching to Plavix and Protonix but he did not like taking the Protonix. It did not help his GERD so he was changed back to Effient.   He is here today for follow up. Denies chest pain, shortness of breath, syncope, orthopnea, PND or edema. He does have chronic dizziness. He has been evaluated in primary care. He is felt to have vertigo. He is exercising several days per week.   Primary Care Physician: Larose Kells  Last Lipid Profile:Lipid Panel     Component Value Date/Time   CHOL 119 03/13/2014 0809   TRIG 168.0* 03/13/2014 0809   HDL 32.10* 03/13/2014 0809   CHOLHDL 4 03/13/2014 0809   VLDL 33.6 03/13/2014 0809   LDLCALC 53 03/13/2014 0809   Past Medical History  Diagnosis Date  . Hyperlipidemia   . GERD (gastroesophageal reflux disease)   . Perennial allergic rhinitis   . Asthma     post infectious; envirronmental triggers  . Coronary artery disease     a. 02/2012 Cath/PCI: LM 10, LAD min irregs, LCX large, OM1 sm, 40 ost, OM2 95p (5.0x16 Veriflex & 4.5x12 Veriflex BMS'), RCA 30p, 35m, 30d, PDA/PLA min irregs, EF 65%  . Hypertension   . Skin cancer     Basal and squamous cell cancers, greater than 20  . BPH (benign prostatic hyperplasia)   . Asthma, chronic 06/16/2007    Qualifier: Diagnosis of  By: Linna Darner MD, Gwyndolyn Saxon   Onset:as child Triggers (environmental, infectious, allergic): all, mainly environmental triggers Rescue inhaler CVE:LFYBOF Maintenance medications/ response:Singulair,Qvar  Smoking history:never Family history pulmonary disease: no    . Benign paroxysmal positional vertigo 11/19/2012    Diagnosed at [facility], [place]  Physical therapy appointment pending     Past Surgical History  Procedure Laterality Date  . Septoplasty    . Hernia repair    . Vasectomy    . Tonsillectomy and adenoidectomy    . Trigger finger release    . Epidural steroids  2007, 2009    X 2 @ cervical &, lumbar)  . Colonoscopy  2003, 2011    negative  . Upper gastrointestinal endoscopy  2011    gastric polyps  . Coronary angioplasty with stent placement  02/06/2012    OM2  bare metal   . Left heart catheterization with coronary angiogram N/A 02/06/2012    Procedure: LEFT HEART CATHETERIZATION WITH CORONARY ANGIOGRAM;  Surgeon: [name], MD;  Location: [facility] CATH LAB;  Service: Cardiovascular;  Laterality: N/A;  . Percutaneous coronary stent intervention (pci-s)  02/06/2012    Procedure: PERCUTANEOUS CORONARY STENT INTERVENTION (PCI-S);  Surgeon: [name], MD;  Location: [facility] CATH LAB;  Service: Cardiovascular;;  . Intravascular ultrasound  02/06/2012    Procedure: INTRAVASCULAR ULTRASOUND;  Surgeon: [name], MD;  Location: [facility] CATH LAB;  Service: Cardiovascular;;    Current Outpatient Prescriptions  Medication Sig Dispense Refill  . albuterol (PROVENTIL HFA;VENTOLIN HFA) 108 (90  BASE) MCG/ACT inhaler Inhale 2 puffs into the lungs as needed. For wheezing    . beclomethasone (QVAR) 80 MCG/ACT inhaler Inhale 1 puff into the lungs 2 (two) times daily. 3 Inhaler 2  . Chlorpheniramine Maleate ([employer] ALLERGY PO) Take 1 tablet by mouth daily.     . CRESTOR 40 MG tablet TAKE 1 TABLET BY MOUTH EVERY DAY 90 tablet 1  . EFFIENT 10 MG TABS tablet TAKE 1 TABLET BY MOUTH DAILY 90 tablet 3  . esomeprazole (NEXIUM) 40 MG capsule Take 40 mg by mouth daily at 12 noon. OVER THE COUNTER    . losartan-hydrochlorothiazide (HYZAAR) 50-12.5 MG per tablet Take 1  tablet by mouth daily. 30 tablet 1  . metoprolol tartrate (LOPRESSOR) 25 MG tablet Take 0.5 tablets (12.5 mg total) by mouth 2 (two) times daily. 90 tablet 1  . mometasone (NASONEX) 50 MCG/ACT nasal spray Place 2 sprays into the nose daily. 17 g 5  . montelukast (SINGULAIR) 10 MG tablet Take 10 mg by mouth at bedtime.    . nitroGLYCERIN (NITROSTAT) 0.4 MG SL tablet Place 1 tablet (0.4 mg total) under the tongue every 5 (five) minutes as needed for chest pain. 25 tablet 6  . tadalafil (CIALIS) 5 MG tablet Take 5 mg by mouth daily as needed for erectile dysfunction.     No current facility-administered medications for this visit.    Allergies  Allergen Reactions  . Aspirin     Angioedema in 1980s Medi Alert bracelet  is recommended  . Nifedipine Rash    edema    History   Social History  . Marital Status: Married    Spouse Name: N/A    Number of Children: 3  . Years of Education: N/A   Occupational History  . 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 Smoking status: Never Smoker   . Smokeless tobacco: Never Used  . Alcohol Use: Yes     Comment: Wine occasionally  . Drug Use: No  . Sexual Activity: Not on file   Other Topics Concern  . Not on file   Social History Narrative   3 daughters, 30 g-children   lifes w/ wife in [place]    Family History  Problem Relation Age of Onset  . Heart attack Father 34    died @ 41  . Heart disease Mother   . Breast cancer Mother   . Esophageal cancer Mother     Barrett's  . Heart attack Maternal Grandmother     after 65  . Dementia Maternal Grandmother     CVA  . Breast cancer Maternal Grandmother   . Diabetes Maternal Grandmother   . Stroke Maternal Grandmother     in 28s  . Heart attack Paternal Uncle 70  . Breast cancer Maternal Aunt   . Colon cancer Neg Hx   . Prostate cancer Neg Hx     Review of Systems:  As stated in the HPI and otherwise negative.   BP 142/92 mmHg  Pulse 54   Ht 5\' 10"  (1.778 m)  Wt 228 lb 6.4 oz (103.602 kg)  BMI 32.77 kg/m2  SpO2 95%  Physical Examination: General: Well developed, well nourished, NAD HEENT: OP clear, mucus membranes moist SKIN: warm, dry. No rashes. Neuro: No focal deficits Musculoskeletal: Muscle strength 5/5 all ext Psychiatric: Mood and affect normal Neck: No JVD, no carotid bruits, no thyromegaly, no lymphadenopathy. Lungs:Clear bilaterally, no wheezes, rhonci, crackles Cardiovascular: Regular  rate and rhythm. No murmurs, gallops or rubs. Abdomen:Soft. Bowel sounds present. Non-tender.  Extremities: No lower extremity edema. Pulses are 2 + in the bilateral DP/PT.  Cardiac cath 02/06/12: Left main: 10% distal stenosis.  Left Anterior Descending Artery: Large caliber vessel that courses to the apex with mild luminal irregularities proximal and distal.  Circumflex Artery: Very large caliber vessel. The first OM is small and has an ostial 40% stenosis. The second OM is very large (5.35mm) and has a 95% stenosis in the proximal portion of the vessel.  Right Coronary Artery: Large, dominant artery with 30% proximal stenosis, 60% mid stenosis, diffuse 30% distal disease. The PDA and PLA are patent with mild plaque disease.  Left Ventricular Angiogram: LVEF=65%  Impression:  1. Double vessel CAD  2. Unstable angina  3. Normal LV systolic function  4. Successful PTCA/bare metal stent x 1 OM2.   EKG: sinus brady, rate 54 bpm  Assessment and Plan:   1. CAD: Stable. Continue Effient. He is intolerant of ASA. Continue beta blocker and statin. No ischemic testing since cath in 2013. Will arrange exercise stress myoview  2. HTN: BP is controlled. No changes.   3. Hyperlipidemia: Will continue statin. Lipids well controlled.    4. Dizziness: Likely vertigo. No carotid bruits. HR is 54 bpm but his dizziness is related to head position changes.

## 2014-03-27 ENCOUNTER — Encounter: Payer: Self-pay | Admitting: Internal Medicine

## 2014-03-28 ENCOUNTER — Ambulatory Visit (HOSPITAL_COMMUNITY): Payer: Managed Care, Other (non HMO) | Attending: Cardiology | Admitting: Radiology

## 2014-03-28 DIAGNOSIS — R0609 Other forms of dyspnea: Secondary | ICD-10-CM | POA: Diagnosis not present

## 2014-03-28 DIAGNOSIS — I2510 Atherosclerotic heart disease of native coronary artery without angina pectoris: Secondary | ICD-10-CM | POA: Diagnosis not present

## 2014-03-28 DIAGNOSIS — R002 Palpitations: Secondary | ICD-10-CM | POA: Diagnosis not present

## 2014-03-28 DIAGNOSIS — R42 Dizziness and giddiness: Secondary | ICD-10-CM | POA: Insufficient documentation

## 2014-03-28 DIAGNOSIS — I10 Essential (primary) hypertension: Secondary | ICD-10-CM | POA: Diagnosis not present

## 2014-03-28 DIAGNOSIS — E785 Hyperlipidemia, unspecified: Secondary | ICD-10-CM

## 2014-03-28 MED ORDER — TECHNETIUM TC 99M SESTAMIBI GENERIC - CARDIOLITE
10.0000 | Freq: Once | INTRAVENOUS | Status: AC | PRN
  Administered 2014-03-28: 10 via INTRAVENOUS

## 2014-03-28 MED ORDER — TECHNETIUM TC 99M SESTAMIBI GENERIC - CARDIOLITE
30.0000 | Freq: Once | INTRAVENOUS | Status: AC | PRN
  Administered 2014-03-28: 30 via INTRAVENOUS

## 2014-03-28 NOTE — Progress Notes (Signed)
[facility] [address] [place], [place] [address] [phone]    Cardiology Nuclear Med Study  George Alvarez is a 68 y.o. male     MRN : [id number]     DOB: May 31, 1945  Procedure Date: 03/28/2014  Nuclear Med Background Indication for Stress Test:  Evaluation for Ischemia and Follow up CAD History:  Asthma and CAD-Stents, MPI: 15 yrs ago NL  Cardiac Risk Factors: Hypertension and Lipids  Symptoms:  Dizziness, DOE and Palpitations   Nuclear Pre-Procedure Caffeine/Decaff Intake:  None NPO After: 7:00pm   Lungs:  clear O2 Sat: 96% on room air. IV 0.9% NS with Angio Cath:  22g  IV Site: R Hand  IV Started by:  Matilde Haymaker, RN  Chest Size (in):  46 Cup Size: n/a  Height: 5\' 10"  (1.778 m)  Weight:  224 lb (101.606 kg)  BMI:  Body mass index is 32.14 kg/(m^2). Tech Comments:  Patient took Metoprolol last pm at 600pm    Nuclear Med Study 1 or 2 day study: 1 day  Stress Test Type:  Stress  Reading MD: n/a  Order Authorizing Provider:  Gennette Pac  Resting Radionuclide: Technetium 78m Sestamibi  Resting Radionuclide Dose: 11.0 mCi   Stress Radionuclide:  Technetium 6m Sestamibi  Stress Radionuclide Dose: 33.0 mCi           Stress Protocol Rest HR: 60 Stress HR: 136  Rest BP: 134/83 Stress BP: 205/96  Exercise Time (min): 6:00 METS: 7.0   Predicted Max HR: 154 bpm % Max HR: 88.31 bpm Rate Pressure Product: 27880   Dose of Adenosine (mg):  n/a Dose of Lexiscan: n/a mg  Dose of Atropine (mg): n/a Dose of Dobutamine: n/a mcg/kg/min (at max HR)  Stress Test Technologist: Perrin Maltese, EMT-P  Nuclear Technologist:  Earl Many, CNMT     Rest Procedure:  Myocardial perfusion imaging was performed at rest 45 minutes following the intravenous administration of Technetium 78m Sestamibi. Rest ECG: NSR - Normal EKG  Stress Procedure:  The patient exercised on the treadmill utilizing the Bruce Protocol for 6:00 minutes.  The patient stopped due to sob and denied any chest pain.  Technetium 46m Sestamibi was injected at peak exercise and myocardial perfusion imaging was performed after a brief delay. Stress ECG: 1 mm ST segment depression in inferolateral leads recovery  QPS Raw Data Images:  Normal; no motion artifact; normal heart/lung ratio. Stress Images:  Normal homogeneous uptake in all areas of the myocardium. Rest Images:  Normal homogeneous uptake in all areas of the myocardium. Subtraction (SDS):  No evidence of ischemia. Transient Ischemic Dilatation (Normal <1.22):  0.85 Lung/Heart Ratio (Normal <0.45):  0.35  Quantitative Gated Spect Images QGS EDV:  98 ml QGS ESV:  37 ml  Impression Exercise Capacity:  Fair exercise capacity. BP Response:  Hypertensive blood pressure response. Clinical Symptoms:  There is dyspnea. ECG Impression:  1 mm ST segment depression in inferolateral leads recovery Comparison with Prior Nuclear Study: No images to compare  Overall Impression:  Low risk stress nuclear study Normal perfusion images ECG positive in recovery may be related to HTN response.  LV Ejection Fraction: 63%.  LV Wall Motion:  NL LV Function; NL Wall Motion   [name]

## 2014-04-07 DIAGNOSIS — C44519 Basal cell carcinoma of skin of other part of trunk: Secondary | ICD-10-CM

## 2014-04-07 DIAGNOSIS — I639 Cerebral infarction, unspecified: Secondary | ICD-10-CM

## 2014-04-07 HISTORY — DX: Cerebral infarction, unspecified: I63.9

## 2014-04-07 HISTORY — PX: COLONOSCOPY: SHX174

## 2014-04-07 HISTORY — DX: Basal cell carcinoma of skin of other part of trunk: C44.519

## 2014-04-11 ENCOUNTER — Encounter: Payer: Self-pay | Admitting: Neurology

## 2014-04-11 ENCOUNTER — Ambulatory Visit (INDEPENDENT_AMBULATORY_CARE_PROVIDER_SITE_OTHER): Payer: Commercial Managed Care - HMO | Admitting: Neurology

## 2014-04-11 VITALS — BP 140/70 | HR 70 | Temp 98.3°F | Resp 20 | Ht 70.0 in | Wt 227.7 lb

## 2014-04-11 DIAGNOSIS — H8110 Benign paroxysmal vertigo, unspecified ear: Secondary | ICD-10-CM

## 2014-04-11 DIAGNOSIS — R404 Transient alteration of awareness: Secondary | ICD-10-CM

## 2014-04-11 NOTE — Progress Notes (Signed)
NEUROLOGY CONSULTATION NOTE  George Alvarez MRN: [id number] DOB: 06/27/1945  Referring provider: Dr. Larose Kells Primary care provider: Dr. Larose Kells  Reason for consult:  vertigo  HISTORY OF PRESENT ILLNESS: George Alvarez is a 69 year old right-handed man with hypertension, hyperlipidemia, GERD, asthma, CAD, BPH and history of basal and squamous cell cancers from over 20 years ago who presents for vertigo.  Records and labs reviewed.  Onset of symptoms started about 2 years ago.  When he either lays down on his back or turns his head to either side (usually worse to the left), he develops spinning sensation associated with nausea.  It lasts about 10 to 15 minutes.  When he gets up, he stumbles a bit.  There is no focal numbness, weakness, or slurred speech.  He was evaluated in [place] by a neurologist who told him he had benign paroxysmal positional vertigo.  It occurs all the time.  He never had imaging or received treatment for it.  He also reports an episode of transient altered awareness which occurred in April 2015.  He got home from work when suddenly he had memory loss.  He knew he was in a safe place but did not know the number of his building, the name of his street, his wife's phone number or his phone number.  He sat down and looked at pictures of his grandchildren, whom he recognized but was not able to remember their names.  Otherwise, he felt fine.  He denied headache, focal weakness, facial droop or numbness.  It lasted about 90 minutes and resolved.  He has not had any prior episode or recurrent episode since that time.  PAST MEDICAL HISTORY: Past Medical History  Diagnosis Date  . Hyperlipidemia   . GERD (gastroesophageal reflux disease)   . Perennial allergic rhinitis   . Asthma     post infectious; envirronmental triggers  . Coronary artery disease     a. 02/2012 Cath/PCI: LM 10, LAD min irregs, LCX large, OM1 sm, 40 ost, OM2 95p (5.0x16 Veriflex & 4.5x12 Veriflex BMS'), RCA  30p, 40m, 30d, PDA/PLA min irregs, EF 65%  . Hypertension   . Skin cancer     Basal and squamous cell cancers, greater than 20  . BPH (benign prostatic hyperplasia)   . Asthma, chronic 06/16/2007    Qualifier: Diagnosis of  By: Linna Darner MD, Gwyndolyn Saxon   Onset:as child Triggers (environmental, infectious, allergic): all, mainly environmental triggers Rescue inhaler DXA:JOINOM Maintenance medications/ response:Singulair,Qvar Smoking history:never Family history pulmonary disease: no    . Benign paroxysmal positional vertigo 11/19/2012    Diagnosed at [facility], [place]  Physical therapy appointment pending     PAST SURGICAL HISTORY: Past Surgical History  Procedure Laterality Date  . Septoplasty    . Hernia repair    . Vasectomy    . Tonsillectomy and adenoidectomy    . Trigger finger release    . Epidural steroids  2007, 2009    X 2 @ cervical &, lumbar)  . Colonoscopy  2003, 2011    negative  . Upper gastrointestinal endoscopy  2011    gastric polyps  . Coronary angioplasty with stent placement  02/06/2012    OM2  bare metal   . Left heart catheterization with coronary angiogram N/A 02/06/2012    Procedure: LEFT HEART CATHETERIZATION WITH CORONARY ANGIOGRAM;  Surgeon: [name], MD;  Location: [facility] CATH LAB;  Service: Cardiovascular;  Laterality: N/A;  . Percutaneous coronary stent intervention (  pci-s)  02/06/2012    Procedure: PERCUTANEOUS CORONARY STENT INTERVENTION (PCI-S);  Surgeon: [name], MD;  Location: [facility] CATH LAB;  Service: Cardiovascular;;  . Intravascular ultrasound  02/06/2012    Procedure: INTRAVASCULAR ULTRASOUND;  Surgeon: [name], MD;  Location: [facility] CATH LAB;  Service: Cardiovascular;;    MEDICATIONS: Current Outpatient Prescriptions on File Prior to Visit  Medication Sig Dispense Refill  . albuterol (PROVENTIL HFA;VENTOLIN HFA) 108 (90 BASE) MCG/ACT inhaler Inhale 2 puffs into the lungs as needed. For wheezing    .  beclomethasone (QVAR) 80 MCG/ACT inhaler Inhale 1 puff into the lungs 2 (two) times daily. 3 Inhaler 2  . Chlorpheniramine Maleate ([employer] ALLERGY PO) Take 1 tablet by mouth daily.     . CRESTOR 40 MG tablet TAKE 1 TABLET BY MOUTH EVERY DAY 90 tablet 1  . EFFIENT 10 MG TABS tablet TAKE 1 TABLET BY MOUTH DAILY 90 tablet 3  . esomeprazole (NEXIUM) 40 MG capsule Take 40 mg by mouth daily at 12 noon. OVER THE COUNTER    . losartan-hydrochlorothiazide (HYZAAR) 50-12.5 MG per tablet Take 1 tablet by mouth daily. 30 tablet 1  . metoprolol tartrate (LOPRESSOR) 25 MG tablet Take 0.5 tablets (12.5 mg total) by mouth 2 (two) times daily. 90 tablet 1  . mometasone (NASONEX) 50 MCG/ACT nasal spray Place 2 sprays into the nose daily. 17 g 5  . montelukast (SINGULAIR) 10 MG tablet Take 10 mg by mouth at bedtime.    . nitroGLYCERIN (NITROSTAT) 0.4 MG SL tablet Place 1 tablet (0.4 mg total) under the tongue every 5 (five) minutes as needed for chest pain. 25 tablet 6  . tadalafil (CIALIS) 5 MG tablet Take 5 mg by mouth daily as needed for erectile dysfunction.     No current facility-administered medications on file prior to visit.    ALLERGIES: Allergies  Allergen Reactions  . Aspirin     Angioedema in 1980s Medi Alert bracelet  is recommended  . Nifedipine Rash    edema    FAMILY HISTORY: Family History  Problem Relation Age of Onset  . Heart attack Father 75    died @ 80  . Heart disease Mother   . Breast cancer Mother   . Esophageal cancer Mother     Barrett's  . Heart attack Maternal Grandmother     after 65  . Dementia Maternal Grandmother     CVA  . Breast cancer Maternal Grandmother   . Diabetes Maternal Grandmother   . Stroke Maternal Grandmother     in 39s  . Heart attack Paternal Uncle 2  . Breast cancer Maternal Aunt   . Colon cancer Neg Hx   . Prostate cancer Neg Hx     SOCIAL HISTORY: History   Social History  . Marital Status: Married    Spouse Name: N/A    Number of  Children: 3  . Years of Education: N/A   Occupational History  . 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 Smoking status: Never Smoker   . Smokeless tobacco: Never Used  . Alcohol Use: 0.0 oz/week    0 Not specified per week     Comment: Wine occasionally  . Drug Use: No  . Sexual Activity:    Partners: Female   Other Topics Concern  . Not on file   Social History Narrative   3 daughters, 14 g-children   lifes w/ wife in [place]  REVIEW OF SYSTEMS: Constitutional: No fevers, chills, or sweats, no generalized fatigue, change in appetite Eyes: No visual changes, double vision, eye pain Ear, nose and throat: No hearing loss, ear pain, nasal congestion, sore throat Cardiovascular: No chest pain, palpitations Respiratory:  No shortness of breath at rest or with exertion, wheezes GastrointestinaI: No nausea, vomiting, diarrhea, abdominal pain, fecal incontinence Genitourinary:  No dysuria, urinary retention or frequency Musculoskeletal:  No neck pain, back pain Integumentary: No rash, pruritus, skin lesions Neurological: as above Psychiatric: No depression, insomnia, anxiety Endocrine: No palpitations, fatigue, diaphoresis, mood swings, change in appetite, change in weight, increased thirst Hematologic/Lymphatic:  No anemia, purpura, petechiae. Allergic/Immunologic: no itchy/runny eyes, nasal congestion, recent allergic reactions, rashes  PHYSICAL EXAM: Filed Vitals:   04/11/14 0925  BP: 140/70  Pulse: 70  Temp: 98.3 F (36.8 C)  Resp: 20   General: No acute distress Head:  Normocephalic/atraumatic Eyes:  fundi unremarkable, without vessel changes, exudates, hemorrhages or papilledema. Neck: supple, no paraspinal tenderness, full range of motion Back: No paraspinal tenderness Heart: regular rate and rhythm Lungs: Clear to auscultation bilaterally. Vascular: No carotid bruits. Neurological Exam: Mental status: alert and oriented to  person, place, and time, recent and remote memory intact, fund of knowledge intact, attention and concentration intact, speech fluent and not dysarthric, language intact. Cranial nerves: CN I: not tested CN II: pupils equal, round and reactive to light, visual fields intact, fundi unremarkable, without vessel changes, exudates, hemorrhages or papilledema. CN III, IV, VI:  full range of motion, no nystagmus, no ptosis CN V: facial sensation intact CN VII: upper and lower face symmetric CN VIII: hearing intact CN IX, X: gag intact, uvula midline CN XI: sternocleidomastoid and trapezius muscles intact CN XII: tongue midline Bulk & Tone: normal, no fasciculations. Motor:  5/5 throughout Sensation:  Temperature and vibration intact Deep Tendon Reflexes:  2+ throughout, toes downgoing. Finger to nose testing:  No dysmetria Heel to shin:  No dysmetria Gait:  Normal station and stride.  Difficulty with tandem walking. Romberg negative. Dix-Hallpike negative but he does get vertigo if he lays back supine with head straight.  IMPRESSION: Benign paroxysmal positional vertigo.  Uncertain laterality but he endorses worse on the left. Transient altered awareness.  Unclear etiology  PLAN: 1.  Will get MRI of the brain and internal auditory canals with and without contrast 2.  If MRI unremarkable, would refer to vestibular rehab 3.  George consider EEG 4.  Optimize blood pressure control  Thank you for allowing me to take part in the care of this patient.  Metta Clines, DO  CC: Kathlene November, MD

## 2014-04-11 NOTE — Patient Instructions (Addendum)
The dizziness does sound like benign positional vertigo.  However, I would like to check an MRI of the brain and auditory canal with and without contrast to look for any other cause.  If MRI not revealing, will refer you to physical therapy for dizziness. [facility] 04/24/14 8:45am

## 2014-04-24 ENCOUNTER — Ambulatory Visit (HOSPITAL_COMMUNITY)
Admission: RE | Admit: 2014-04-24 | Discharge: 2014-04-24 | Disposition: A | Discharge status: Home / Self Care | Payer: Commercial Managed Care - HMO | Source: Ambulatory Visit | Attending: Neurology | Admitting: Neurology

## 2014-04-24 ENCOUNTER — Telehealth: Payer: Self-pay | Admitting: Neurology

## 2014-04-24 ENCOUNTER — Other Ambulatory Visit: Payer: Self-pay | Admitting: *Deleted

## 2014-04-24 ENCOUNTER — Encounter: Payer: Self-pay | Admitting: Neurology

## 2014-04-24 DIAGNOSIS — H8110 Benign paroxysmal vertigo, unspecified ear: Secondary | ICD-10-CM

## 2014-04-24 DIAGNOSIS — R404 Transient alteration of awareness: Secondary | ICD-10-CM | POA: Insufficient documentation

## 2014-04-24 MED ORDER — GADOBENATE DIMEGLUMINE 529 MG/ML IV SOLN
20.0000 mL | Freq: Once | INTRAVENOUS | Status: AC | PRN
  Administered 2014-04-24: 20 mL via INTRAVENOUS

## 2014-04-24 NOTE — Telephone Encounter (Signed)
Patient is aware of MRA scheduled at [facility] on 05/10/14 8:45 am  And order for vestibular rehab has been put into [facility] if he has not heard from them by Friday he was ask to call off back

## 2014-04-24 NOTE — Telephone Encounter (Signed)
Called and discussed MRI results with George Alvarez.  There is nothing acute but there is evidence of old cerebellar infarcts.  How much this is contributing to chronic dizziness is uncertain.  He has an allergy to ASA.  He is on Effient.  I would like to check MRA of the head to look for any significant stenosis in the posterior circulation.  He is also agreeable to vestibular rehab, so we will set that up as well.

## 2014-04-30 ENCOUNTER — Other Ambulatory Visit: Payer: Self-pay | Admitting: Internal Medicine

## 2014-05-01 ENCOUNTER — Encounter: Payer: Self-pay | Admitting: Neurology

## 2014-05-01 ENCOUNTER — Other Ambulatory Visit: Payer: Self-pay

## 2014-05-01 ENCOUNTER — Telehealth: Payer: Self-pay | Admitting: *Deleted

## 2014-05-01 NOTE — Telephone Encounter (Signed)
Copy of MRI mailed to patient

## 2014-05-02 ENCOUNTER — Ambulatory Visit (INDEPENDENT_AMBULATORY_CARE_PROVIDER_SITE_OTHER): Payer: Commercial Managed Care - HMO | Admitting: Gastroenterology

## 2014-05-02 ENCOUNTER — Telehealth: Payer: Self-pay

## 2014-05-02 ENCOUNTER — Encounter: Payer: Self-pay | Admitting: Gastroenterology

## 2014-05-02 VITALS — BP 134/78 | HR 60 | Ht 70.0 in | Wt 225.4 lb

## 2014-05-02 DIAGNOSIS — Z86010 Personal history of colonic polyps: Secondary | ICD-10-CM

## 2014-05-02 DIAGNOSIS — I2510 Atherosclerotic heart disease of native coronary artery without angina pectoris: Secondary | ICD-10-CM

## 2014-05-02 DIAGNOSIS — R42 Dizziness and giddiness: Secondary | ICD-10-CM

## 2014-05-02 NOTE — Telephone Encounter (Signed)
  05/02/2014   RE: George Alvarez DOB: 04-Dec-1945 MRN: [id number]   Dear Dr. Angelena Form,    We have scheduled the above patient for an endoscopic procedure. Our records show that he is on anticoagulation therapy.   Please advise as to how long the patient may come off his therapy of Effient prior to the procedure, which is not been scheduled yet.  Please fax back/ or route the completed form to [facility] at [phone].   Sincerely,    Marlon Pel, CMA

## 2014-05-02 NOTE — Patient Instructions (Signed)
Please call our office to schedule your Colonoscopy and pre-visit once you have been cleared from a Neurology ( Dr. Tomi Likens) standpoint.  Thank you for choosing me and [facility].  Pricilla Riffle. Dagoberto Ligas., MD., Marval Regal

## 2014-05-02 NOTE — Progress Notes (Signed)
History of Present Illness: This is a 69 year old male with a history of adenomatous colon polyps returning for follow-up. His last colonoscopy was in October 2010. He is due for a five-year surveillance. His reflux symptoms are well controlled on daily Nexium. He has coronary artery disease and had a stent placed in 2013 and is maintained on Effient. Over the past several weeks he has had problems with dizziness and he is undergoing evaluation by Dr. Tomi Likens. An MRI revealed chronic bilateral cerebellar infarcts and he has an MRA scheduled. Dr. Susa Raring advised him to remain on Effient. Denies weight loss, abdominal pain, constipation, diarrhea, change in stool caliber, melena, hematochezia, nausea, vomiting, dysphagia, reflux symptoms, chest pain.   Allergies  Allergen Reactions  . Aspirin     Angioedema in 1980s Medi Alert bracelet  is recommended  . Nifedipine Rash    edema   Outpatient Prescriptions Prior to Visit  Medication Sig Dispense Refill  . albuterol (PROVENTIL HFA;VENTOLIN HFA) 108 (90 BASE) MCG/ACT inhaler Inhale 2 puffs into the lungs as needed. For wheezing    . beclomethasone (QVAR) 80 MCG/ACT inhaler Inhale 1 puff into the lungs 2 (two) times daily. 3 Inhaler 2  . Chlorpheniramine Maleate ([employer] ALLERGY PO) Take 1 tablet by mouth daily.     . CRESTOR 40 MG tablet TAKE 1 TABLET BY MOUTH EVERY DAY 90 tablet 1  . EFFIENT 10 MG TABS tablet TAKE 1 TABLET BY MOUTH DAILY 90 tablet 3  . esomeprazole (NEXIUM) 40 MG capsule Take 40 mg by mouth daily at 12 noon. OVER THE COUNTER    . losartan-hydrochlorothiazide (HYZAAR) 50-12.5 MG per tablet TAKE 1 TABLET BY MOUTH DAILY. 30 tablet 3  . metoprolol tartrate (LOPRESSOR) 25 MG tablet Take 0.5 tablets (12.5 mg total) by mouth 2 (two) times daily. 90 tablet 1  . mometasone (NASONEX) 50 MCG/ACT nasal spray Place 2 sprays into the nose daily. 17 g 5  . montelukast (SINGULAIR) 10 MG tablet Take 10 mg by mouth at bedtime.    . nitroGLYCERIN  (NITROSTAT) 0.4 MG SL tablet Place 1 tablet (0.4 mg total) under the tongue every 5 (five) minutes as needed for chest pain. 25 tablet 6  . tadalafil (CIALIS) 5 MG tablet Take 5 mg by mouth daily as needed for erectile dysfunction.     No facility-administered medications prior to visit.   Past Medical History  Diagnosis Date  . Hyperlipidemia   . GERD (gastroesophageal reflux disease)   . Perennial allergic rhinitis   . Asthma     post infectious; envirronmental triggers  . Coronary artery disease     a. 02/2012 Cath/PCI: LM 10, LAD min irregs, LCX large, OM1 sm, 40 ost, OM2 95p (5.0x16 Veriflex & 4.5x12 Veriflex BMS'), RCA 30p, 76m, 30d, PDA/PLA min irregs, EF 65%  . Hypertension   . Skin cancer     Basal and squamous cell cancers, greater than 20  . BPH (benign prostatic hyperplasia)   . Asthma, chronic 06/16/2007    Qualifier: Diagnosis of  By: Linna Darner MD, Gwyndolyn Saxon   Onset:as child Triggers (environmental, infectious, allergic): all, mainly environmental triggers Rescue inhaler WYO:VZCHYI Maintenance medications/ response:Singulair,Qvar Smoking history:never Family history pulmonary disease: no    . Benign paroxysmal positional vertigo 11/19/2012    Diagnosed at [facility], [place]  Physical therapy appointment pending   . Anal fissure   . Diverticulosis   . Fundic gland polyps of stomach, benign 2010  . Tubular adenoma of colon  2010  . Stroke 2016   Past Surgical History  Procedure Laterality Date  . Septoplasty    . Hernia repair    . Vasectomy    . Tonsillectomy and adenoidectomy    . Trigger finger release    . Epidural steroids  2007, 2009    X 2 @ cervical &, lumbar)  . Colonoscopy  2003, 2011    negative  . Upper gastrointestinal endoscopy  2011    gastric polyps  . Coronary angioplasty with stent placement  02/06/2012    OM2  bare metal   . Left heart catheterization with coronary angiogram N/A 02/06/2012    Procedure: LEFT HEART CATHETERIZATION WITH CORONARY  ANGIOGRAM;  Surgeon: [name], MD;  Location: [facility] CATH LAB;  Service: Cardiovascular;  Laterality: N/A;  . Percutaneous coronary stent intervention (pci-s)  02/06/2012    Procedure: PERCUTANEOUS CORONARY STENT INTERVENTION (PCI-S);  Surgeon: [name], MD;  Location: [facility] CATH LAB;  Service: Cardiovascular;;  . Intravascular ultrasound  02/06/2012    Procedure: INTRAVASCULAR ULTRASOUND;  Surgeon: [name], MD;  Location: [facility] CATH LAB;  Service: Cardiovascular;;   History   Social History  . Marital Status: Married    Spouse Name: N/A    Number of Children: 3  . Years of Education: N/A   Occupational History  . 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 Smoking status: Never Smoker   . Smokeless tobacco: Never Used  . Alcohol Use: 0.0 oz/week    0 Not specified per week     Comment: Wine occasionally  . Drug Use: No  . Sexual Activity:    Partners: Female   Other Topics Concern  . None   Social History Narrative   3 daughters, 22 g-children   lifes w/ wife in [place]   Family History  Problem Relation Age of Onset  . Heart attack Father 50    died @ 60  . Heart disease Mother   . Breast cancer Mother   . Esophageal cancer Mother     Barrett's  . Heart attack Maternal Grandmother     after 65  . Dementia Maternal Grandmother     CVA  . Breast cancer Maternal Grandmother   . Diabetes Maternal Grandmother   . Stroke Maternal Grandmother     in 6s  . Heart attack Paternal Uncle 61  . Breast cancer Maternal Aunt   . Colon cancer Neg Hx   . Prostate cancer Neg Hx   . Colon polyps Neg Hx     Review of Systems: Pertinent positive and negative review of systems were noted in the above HPI section. All other review of systems were otherwise negative.   Physical Exam: General: Well developed , well nourished, no acute distress Head: Normocephalic and atraumatic Eyes:  sclerae anicteric, EOMI Ears:  Normal auditory acuity Mouth: No deformity or lesions Neck: Supple, no masses or thyromegaly Lungs: Clear throughout to auscultation Heart: Regular rate and rhythm; no murmurs, rubs or bruits Abdomen: Soft, non tender and non distended. No masses, hepatosplenomegaly or hernias noted. Normal Bowel sounds Rectal: deferred to colonoscopy Musculoskeletal: Symmetrical with no gross deformities  Skin: No lesions on visible extremities Pulses:  Normal pulses noted Extremities: No clubbing, cyanosis, edema or deformities noted Neurological: Alert oriented x 4, grossly nonfocal Cervical Nodes:  No significant cervical adenopathy Inguinal Nodes: No significant inguinal adenopathy Psychological:  Alert and cooperative. Normal mood and affect  Assessment and Recommendations:  1. Personal history of adenomatous colon polyps. The risks, benefits, and alternatives to colonoscopy with possible biopsy and possible polypectomy were discussed with the patient and they consent to proceed. Ideally a 5 day hold Effient is recommended or colonoscopy to reduce the risk of bleeding following a biopsy or polypectomy. This will require clearance from Dr. Angelena Form and Dr. Tomi Likens.  2. GERD. Continue Nexium daily and standard antireflux measures.  3. Coronary artery disease status post stent placement on Effient. Myocardial perfusion imaging in December was normal.  4. Dizziness-undergoing evaluation with chronic bilateral cerebellar infarcts on MRI. Complete evaluation with Dr. Tomi Likens before scheduling colonoscopy.

## 2014-05-02 NOTE — Telephone Encounter (Signed)
Informed patient that Dr. Camillia Herter recommendations. Awaiting clearance from Dr. Tomi Likens before scheduling Colonoscopy. Pt agreed and verbalized understanding.

## 2014-05-02 NOTE — Telephone Encounter (Signed)
He can hold his Effient for 7 days prior to his procedure.   MCALHANY,CHRISTOPHER 05/02/2014 1:45 PM

## 2014-05-10 ENCOUNTER — Telehealth: Payer: Self-pay | Admitting: *Deleted

## 2014-05-10 ENCOUNTER — Ambulatory Visit (HOSPITAL_COMMUNITY)
Admission: RE | Admit: 2014-05-10 | Discharge: 2014-05-10 | Disposition: A | Discharge status: Home / Self Care | Payer: Commercial Managed Care - HMO | Source: Ambulatory Visit | Attending: Neurology | Admitting: Neurology

## 2014-05-10 DIAGNOSIS — H8110 Benign paroxysmal vertigo, unspecified ear: Secondary | ICD-10-CM

## 2014-05-10 DIAGNOSIS — R42 Dizziness and giddiness: Secondary | ICD-10-CM | POA: Insufficient documentation

## 2014-05-10 NOTE — Telephone Encounter (Signed)
Patient is aware that   MRA of head is unremarkable.

## 2014-05-10 NOTE — Telephone Encounter (Signed)
-----   Message from Dudley Major, DO sent at 05/10/2014 11:29 AM EST ----- MRA of head is unremarkable. ----- Message -----    From: Rad Results In Interface    Sent: 05/10/2014  11:00 AM      To: Dudley Major, DO

## 2014-05-13 ENCOUNTER — Encounter: Payer: Self-pay | Admitting: Neurology

## 2014-05-15 ENCOUNTER — Telehealth: Payer: Self-pay | Admitting: *Deleted

## 2014-05-15 NOTE — Telephone Encounter (Signed)
Left message for patient that I would call [facility] and check on his appt for vestibular therapy  and givehim a call back

## 2014-05-15 NOTE — Telephone Encounter (Signed)
patient is aware of [facility] appt on 05/24/14 at 1200pm address and telephone number was given

## 2014-05-24 ENCOUNTER — Ambulatory Visit: Payer: Medicare HMO | Attending: Neurology | Admitting: Rehabilitative and Restorative Service Providers"

## 2014-05-24 DIAGNOSIS — E785 Hyperlipidemia, unspecified: Secondary | ICD-10-CM | POA: Diagnosis not present

## 2014-05-24 DIAGNOSIS — J45909 Unspecified asthma, uncomplicated: Secondary | ICD-10-CM | POA: Diagnosis not present

## 2014-05-24 DIAGNOSIS — N400 Enlarged prostate without lower urinary tract symptoms: Secondary | ICD-10-CM | POA: Diagnosis not present

## 2014-05-24 DIAGNOSIS — R269 Unspecified abnormalities of gait and mobility: Secondary | ICD-10-CM | POA: Insufficient documentation

## 2014-05-24 DIAGNOSIS — I2510 Atherosclerotic heart disease of native coronary artery without angina pectoris: Secondary | ICD-10-CM | POA: Insufficient documentation

## 2014-05-24 DIAGNOSIS — H8112 Benign paroxysmal vertigo, left ear: Secondary | ICD-10-CM

## 2014-05-24 DIAGNOSIS — K219 Gastro-esophageal reflux disease without esophagitis: Secondary | ICD-10-CM | POA: Diagnosis not present

## 2014-05-24 NOTE — Therapy (Signed)
[facility] [address] [place], [place], [address] Phone: [phone]   Fax:  [phone]  Physical Therapy Evaluation  Patient Details  Name: George Alvarez MRN: [id number] Date of Birth: 06-Jan-1946 Referring Provider:  Colon Branch, MD  Encounter Date: 05/24/2014      PT End of Session - 05/24/14 1326    Visit Number 1   Number of Visits 4   Date for PT Re-Evaluation 06/22/14   PT Start Time 1238   PT Stop Time 1320   PT Time Calculation (min) 42 min   Activity Tolerance Patient tolerated treatment well   Behavior During Therapy [facility] for tasks assessed/performed      Past Medical History  Diagnosis Date  . Hyperlipidemia   . GERD (gastroesophageal reflux disease)   . Perennial allergic rhinitis   . Asthma     post infectious; envirronmental triggers  . Coronary artery disease     a. 02/2012 Cath/PCI: LM 10, LAD min irregs, LCX large, OM1 sm, 40 ost, OM2 95p (5.0x16 Veriflex & 4.5x12 Veriflex BMS'), RCA 30p, 6m, 30d, PDA/PLA min irregs, EF 65%  . Hypertension   . Skin cancer     Basal and squamous cell cancers, greater than 20  . BPH (benign prostatic hyperplasia)   . Asthma, chronic 06/16/2007    Qualifier: Diagnosis of  By: Linna Darner MD, Gwyndolyn Saxon   Onset:as child Triggers (environmental, infectious, allergic): all, mainly environmental triggers Rescue inhaler VCB:SWHQPR Maintenance medications/ response:Singulair,Qvar Smoking history:never Family history pulmonary disease: no    . Benign paroxysmal positional vertigo 11/19/2012    Diagnosed at [facility], [place]  Physical therapy appointment pending   . Anal fissure   . Diverticulosis   . Fundic gland polyps of stomach, benign 2010  . Tubular adenoma of colon 2010  . Stroke 2016    Past Surgical History  Procedure Laterality Date  . Septoplasty    . Hernia repair    . Vasectomy    . Tonsillectomy and adenoidectomy    . Trigger finger release     . Epidural steroids  2007, 2009    X 2 @ cervical &, lumbar)  . Colonoscopy  2003, 2011    negative  . Upper gastrointestinal endoscopy  2011    gastric polyps  . Coronary angioplasty with stent placement  02/06/2012    OM2  bare metal   . Left heart catheterization with coronary angiogram N/A 02/06/2012    Procedure: LEFT HEART CATHETERIZATION WITH CORONARY ANGIOGRAM;  Surgeon: [name], MD;  Location: [facility] CATH LAB;  Service: Cardiovascular;  Laterality: N/A;  . Percutaneous coronary stent intervention (pci-s)  02/06/2012    Procedure: PERCUTANEOUS CORONARY STENT INTERVENTION (PCI-S);  Surgeon: [name], MD;  Location: [facility] CATH LAB;  Service: Cardiovascular;;  . Intravascular ultrasound  02/06/2012    Procedure: INTRAVASCULAR ULTRASOUND;  Surgeon: [name], MD;  Location: [facility] CATH LAB;  Service: Cardiovascular;;    There were no vitals taken for this visit.  Visit Diagnosis:  BPPV (benign paroxysmal positional vertigo), left      Subjective Assessment - 05/24/14 1241    Symptoms The patient presents to OP rehab today reporting onset of dizziness, brief duration that began 2 years ago.  He notes spinning sensation when rolling R<>L and sleeps propped up.  He notes symptoms worse when he is flat.  He has occasional episodes of dizziness when bending forward.  He notes mild imbalance when  getting up quickly and notes holding the walls sometimes in the morning.    Pertinent History 2 episodes of altered mental state.    Patient Stated Goals Treat dizziness.   Currently in Pain? No/denies          [facility] PT Assessment - 05/24/14 1245    Assessment   Medical Diagnosis BPPV   Onset Date --  2 years ago   Balance Screen   Has the patient fallen in the past 6 months No   Has the patient had a decrease in activity level because of a fear of falling?  No   Is the patient reluctant to leave their home because of a fear of falling?  No   Home  Environment   Living Enviornment Private residence   Type of [place] Access Stairs to enter   Home Layout Two level   Prior Function   Level of [place] with gait   Vocation Retired;Part time employment  works some each week   Observation/Other Assessments   Focus on Therapeutic Outcomes (FOTO)  81%   Other Surveys  --  DHI=22%            Vestibular Assessment - 05/24/14 1248    Type of Dizziness Spinning   Frequency of Dizziness --  intermittent   Duration of Dizziness --  seconds   Aggravating Factors Turning head quickly   Relieving Factors Head stationary   Dix-Hallpike Dix-Hallpike Right;Dix-Hallpike Left   Sidelying Test Sidelying Right;Sidelying Left   Horizontal Canal Testing Horizontal Canal Right;Horizontal Canal Left   Dix-Hallpike Right Symptoms No nystagmus   Dix-Hallpike Left Duration --  30 seconds   Dix-Hallpike Left Symptoms Upbeat, left rotatory nystagmus   Horizontal Canal Right Symptoms Normal   Horizontal Canal Left Symptoms Normal               Vestibular Treatment/Exercise - 05/24/14 1302    Vestibular Treatment Provided Canalith Repositioning   Canalith Repositioning Epley Manuever Left   Number of Reps  2   Overall Response  Improved Symptoms               PT Education - 05/24/14 1326    Education provided Yes   Education Details BPPV handout from vestibular disorders association   Person(s) Educated Patient   Methods Explanation;Handout   Comprehension Verbalized understanding             PT Long Term Goals - 05/24/14 1327    PT LONG TERM GOAL #1   Title The patient will have negative positional testing for BPPV.  Target date 06/22/2014   Time 4   Period Weeks   PT LONG TERM GOAL #2   Title The patient will verbalize understanding of HEP for self management of BPPV.  Target date 06/22/2014.   Time 4   Period Weeks   PT LONG TERM GOAL #3   Title The patient will improve DHI from 22%  down to 12% to demo improving subjective sensation of dizziness.  Target date 06/22/2014.   Time 4   Period Weeks               Plan - 05/24/14 1328    Clinical Impression Statement The patient is a 69 yo male presenting to OP rehab with h/o intermittent spells of room spinning dizziness.  He is positive for L dix hallpike indicating L posterior canalithiasis.  He responded well to L Epley's maneuver today.  PT to  f/u to ensure vertigo improved with canolith repoisitioning and teach self management for HEP (brandt daroff).    Pt will benefit from skilled therapeutic intervention in order to improve on the following deficits Abnormal gait;Decreased balance;Difficulty walking;Other (comment)  vertigo with transitional movements   Rehab Potential Good   PT Frequency 1x / week   PT Duration 4 weeks   PT Treatment/Interventions Patient/family education;Therapeutic activities;Neuromuscular re-education;Balance training;Therapeutic exercise;Other (comment)  vestibular rehab   PT Next Visit Plan Check L BPPV (posterior canal) and ensure no evidence of horizontal canal BPPV (due to subjective reports with rolling-not present at today's eval).  Teach brandt daroff.   Consulted and Agree with Plan of Care Patient          G-Codes - 31-May-2014 1331    Functional Assessment Tool Used L BPPV   Functional Limitation Self care   Self Care Current Status (K2446) At least 20 percent but less than 40 percent impaired, limited or restricted   Self Care Goal Status (X5072) At least 1 percent but less than 20 percent impaired, limited or restricted       Problem List Patient Active Problem List   Diagnosis Date Noted  . Annual physical exam 02/20/2014  . Amnesia 02/20/2014  . Prediabetes 02/05/2013  . Benign paroxysmal positional vertigo 11/19/2012  . Unstable angina 02/07/2012  . CAD (coronary artery disease) 02/07/2012  . GERD 01/01/2009  . SKIN CANCER, HX OF 12/28/2007  . HYPERLIPIDEMIA  06/16/2007  . HTN (hypertension) 06/16/2007  . Asthma, chronic 06/16/2007  . LUMBOSACRAL RADICULOPATHY 03/01/2007  . NEPHROLITHIASIS, HX OF 05/14/2006    Thank you for the referral of this patient.   [place], [place] 2014-05-31, 1:32 PM  [facility] [address] [place], [place], [address] Phone: [phone]   Fax:  [phone]

## 2014-06-01 ENCOUNTER — Ambulatory Visit: Payer: Medicare HMO | Admitting: Rehabilitative and Restorative Service Providers"

## 2014-06-01 DIAGNOSIS — H8112 Benign paroxysmal vertigo, left ear: Secondary | ICD-10-CM

## 2014-06-01 DIAGNOSIS — R269 Unspecified abnormalities of gait and mobility: Secondary | ICD-10-CM

## 2014-06-01 NOTE — Therapy (Signed)
[facility] [address] [place], [place], [address] Phone: [phone]   Fax:  [phone]  Physical Therapy Treatment  Patient Details  Name: George Alvarez MRN: [id number] Date of Birth: May 28, 1945 Referring Provider:  Colon Branch, MD  Encounter Date: 06/01/2014      PT End of Session - 06/01/14 0843    Visit Number 2   Number of Visits 4   Date for PT Re-Evaluation 06/22/14   PT Start Time 0804   PT Stop Time 0840   PT Time Calculation (min) 36 min   Activity Tolerance Patient tolerated treatment well   Behavior During Therapy [facility] for tasks assessed/performed      Past Medical History  Diagnosis Date  . Hyperlipidemia   . GERD (gastroesophageal reflux disease)   . Perennial allergic rhinitis   . Asthma     post infectious; envirronmental triggers  . Coronary artery disease     a. 02/2012 Cath/PCI: LM 10, LAD min irregs, LCX large, OM1 sm, 40 ost, OM2 95p (5.0x16 Veriflex & 4.5x12 Veriflex BMS'), RCA 30p, 73m 30d, PDA/PLA min irregs, EF 65%  . Hypertension   . Skin cancer     Basal and squamous cell cancers, greater than 20  . BPH (benign prostatic hyperplasia)   . Asthma, chronic 06/16/2007    Qualifier: Diagnosis of  By: HLinna DarnerMD, WGwyndolyn Saxon  Onset:as child Triggers (environmental, infectious, allergic): all, mainly environmental triggers Rescue inhaler uZGY:FVCBSWMaintenance medications/ response:Singulair,Qvar Smoking history:never Family history pulmonary disease: no    . Benign paroxysmal positional vertigo 11/19/2012    [facility] [place] [facility] [place] [facility]   . Anal fissure   . Diverticulosis   . Fundic gland polyps of stomach, benign 2010  . Tubular adenoma of colon 2010  . Stroke 2016    Past Surgical History  Procedure Laterality Date  . Septoplasty    . Hernia repair    . Vasectomy    . Tonsillectomy and adenoidectomy    . Trigger finger release     . Epidural steroids  2007, 2009    X 2 @ cervical &, lumbar)  . Colonoscopy  2003, 2011    negative  . Upper gastrointestinal endoscopy  2011    gastric polyps  . Coronary angioplasty with stent placement  02/06/2012    OM2  bare metal   . Left heart catheterization with coronary angiogram N/A 02/06/2012    Procedure: LEFT HEART CATHETERIZATION WITH CORONARY ANGIOGRAM;  Surgeon: CBurnell Blanks MD;  [facility];  Service: Cardiovascular;  Laterality: N/A;  . Percutaneous coronary stent intervention (pci-s)  02/06/2012    Procedure: PERCUTANEOUS CORONARY STENT INTERVENTION (PCI-S);  Surgeon: CBurnell Blanks MD;  [facility];  Service: Cardiovascular;;  . Intravascular ultrasound  02/06/2012    Procedure: INTRAVASCULAR ULTRASOUND;  Surgeon: CBurnell Blanks MD;  [facility];  Service: Cardiovascular;;    There were no vitals taken for this visit.  Visit Diagnosis:  Abnormality of gait  BPPV (benign paroxysmal positional vertigo), left      Subjective Assessment - 06/01/14 0805    Symptoms The patient feels dizziness improved, but still notes imbalance.   Currently in Pain? No/denies     NEUROMUSCULAR RE-EDUCATION: Balance HEP provided including: corner balance with pillow + eyes closed, Tandem stance with eyes open Single leg stance  Gaze x 1 viewing standing with feet apart  Vestibular Assessment - 06/01/14 0811    Positional Testing   Dix-Hallpike Dix-Hallpike Right;Dix-Hallpike Left   Sidelying Test Sidelying Right;Sidelying Left   Horizontal Canal Testing Horizontal Canal Right;Horizontal Canal Left   Dix-Hallpike Right   Dix-Hallpike Right Symptoms No nystagmus   Dix-Hallpike Left   Dix-Hallpike Left Symptoms No nystagmus   Sidelying Right   Sidelying Right Symptoms No nystagmus   Sidelying Left   Sidelying Left Symptoms No nystagmus   Horizontal Canal Right   Horizontal Canal Right Symptoms Normal    Horizontal Canal Left   Horizontal Canal Left Symptoms Normal               Vestibular Treatment/Exercise - 06/01/14 0807    Vestibular Treatment/Exercise   Vestibular Treatment Provided Habituation   Habituation Exercises Nestor Lewandowsky   Gaze Exercises X1 Viewing Horizontal   Nestor Lewandowsky   Number of Reps  --  1 and instructed in HEP for self management of BPPV   Symptom Description  --  no dizziness or nystagmus noted today   X1 Viewing Horizontal   Foot Position --  standing feet apart   Time --  30 seconds   Reps --  2   Comments --  mild sensation of visual blurring with increased speed               PT Education - 06/01/14 0841    Education provided Yes   Education Details HEP: gaze x 1 viewing, standing balance: tandem, pillow + eyes closed, single limb, habiutation brandt daroff if needed   Person(s) Educated Patient   Methods Explanation;Demonstration;Handout   Comprehension Verbalized understanding;Returned demonstration             PT Long Term Goals - 06/01/14 0844    PT LONG TERM GOAL #1   Title The patient will have negative positional testing for BPPV.  Target date 06/22/2014   Baseline Met on 06/01/2014   Time 4   Period Weeks   Status Achieved   PT LONG TERM GOAL #2   Title The patient will verbalize understanding of HEP for self management of BPPV.  Target date 06/22/2014.   Baseline Met on 06/01/2014   Time 4   Period Weeks   Status Achieved   PT LONG TERM GOAL #3   Title The patient will improve DHI from 22% down to 12% to demo improving subjective sensation of dizziness.  Target date 06/22/2014.   Time 4   Period Weeks   Status On-going               Plan - 06/01/14 6384    Clinical Impression Statement The patient was negative today for positional testing, however noted mild imbalance. PT addressed with high level balance exercises for home with emphasis on using vestibular inputs for balance.   Scheduled 1  follow-up in 3 weeks to ensure HEP going well and patient to call and cancel if not needed.   PT Next Visit Plan f/u to check HEP and d/c (do G code)   Consulted and Agree with Plan of Care Patient        Problem List Patient Active Problem List   Diagnosis Date Noted  . Annual physical exam 02/20/2014  . Amnesia 02/20/2014  . Prediabetes 02/05/2013  . Benign paroxysmal positional vertigo 11/19/2012  . Unstable angina 02/07/2012  . CAD (coronary artery disease) 02/07/2012  . GERD 01/01/2009  . SKIN CANCER, HX OF 12/28/2007  . HYPERLIPIDEMIA 06/16/2007  .  HTN (hypertension) 06/16/2007  . Asthma, chronic 06/16/2007  . LUMBOSACRAL RADICULOPATHY 03/01/2007  . NEPHROLITHIASIS, HX OF 05/14/2006    Deavin Forst, PT 06/01/2014, 8:46 AM  [facility] [address] [place], [place], [address] Phone: [phone]   Fax:  [phone]

## 2014-06-01 NOTE — Patient Instructions (Signed)
Gaze Stabilization: Tip Card 1.Target must remain in focus, not blurry, and appear stationary while head is in motion. 2.Perform exercises with small head movements (45 to either side of midline). 3.Increase speed of head motion so long as target is in focus. 4.If you wear eyeglasses, be sure you can see target through lens (therapist will give specific instructions for bifocal / progressive lenses). 5.These exercises may provoke dizziness or nausea. Work through these symptoms. If too dizzy, slow head movement slightly. Rest between each exercise. 6.Exercises demand concentration; avoid distractions. 7.For safety, perform standing exercises close to a counter, wall, corner, or next to someone.  Copyright  VHI. All rights reserved.  Gaze Stabilization: Standing Feet Apart   Feet shoulder width apart, keeping eyes on target on wall _3-5___ feet away, tilt head down slightly and move head side to side for _30___ seconds.  Do __2-3__ sessions per day.   Copyright  VHI. All rights reserved.  Feet Heel-Toe "Tandem", Varied Arm Positions - Eyes Open   With eyes open, right foot directly in front of the other, arms out, look straight ahead at a stationary object. Hold _30___ seconds. Repeat __3__ times per session. Do _2___ sessions per day.  Copyright  VHI. All rights reserved.  Feet Together (Compliant Surface) Varied Arm Positions - Eyes Closed   Stand on compliant surface: __pillow______ with feet together and arms at your side. Close eyes and visualize upright position. Hold_30___ seconds. Repeat __3__ times per session. Do _2___ sessions per day.  Copyright  VHI. All rights reserved.  Single Leg Balance: Eyes Open   Stand on right leg with eyes open. Hold _10-15__ seconds. _3__ reps __2_ times per day.  http://ggbe.exer.us/4   Copyright  VHI. All rights reserved.    ONLY IF DIZZINESS RETURNS: Sit to Side-Lying   Sit on edge of bed. 1. Turn head 45 to right. 2.  Maintain head position and lie down slowly on left side. Hold until symptoms subside. 3. Sit up slowly. Hold until symptoms subside. 4. Turn head 45 to left. 5. Maintain head position and lie down slowly on right side. Hold until symptoms subside. 6. Sit up slowly. Repeat sequence _5___ times per session. Do __2-3__ sessions per day.  Copyright  VHI. All rights reserved.

## 2014-06-08 ENCOUNTER — Encounter: Payer: Self-pay | Admitting: Gastroenterology

## 2014-06-19 ENCOUNTER — Encounter: Payer: Medicare HMO | Admitting: Rehabilitative and Restorative Service Providers"

## 2014-06-21 ENCOUNTER — Other Ambulatory Visit: Payer: Self-pay

## 2014-06-21 ENCOUNTER — Ambulatory Visit (INDEPENDENT_AMBULATORY_CARE_PROVIDER_SITE_OTHER): Payer: Commercial Managed Care - HMO | Admitting: Internal Medicine

## 2014-06-21 ENCOUNTER — Encounter: Payer: Self-pay | Admitting: Internal Medicine

## 2014-06-21 VITALS — BP 128/82 | HR 58 | Temp 98.2°F | Ht 70.0 in | Wt 230.4 lb

## 2014-06-21 DIAGNOSIS — H8110 Benign paroxysmal vertigo, unspecified ear: Secondary | ICD-10-CM

## 2014-06-21 DIAGNOSIS — R7309 Other abnormal glucose: Secondary | ICD-10-CM

## 2014-06-21 DIAGNOSIS — I2510 Atherosclerotic heart disease of native coronary artery without angina pectoris: Secondary | ICD-10-CM

## 2014-06-21 DIAGNOSIS — I10 Essential (primary) hypertension: Secondary | ICD-10-CM

## 2014-06-21 DIAGNOSIS — N400 Enlarged prostate without lower urinary tract symptoms: Secondary | ICD-10-CM

## 2014-06-21 DIAGNOSIS — R7303 Prediabetes: Secondary | ICD-10-CM

## 2014-06-21 DIAGNOSIS — I639 Cerebral infarction, unspecified: Secondary | ICD-10-CM

## 2014-06-21 LAB — BASIC METABOLIC PANEL
BUN: 19 mg/dL (ref 6–23)
CO2: 24 mEq/L (ref 19–32)
CREATININE: 1.08 mg/dL (ref 0.40–1.50)
Calcium: 9.2 mg/dL (ref 8.4–10.5)
Chloride: 107 mEq/L (ref 96–112)
GFR: 72.64 mL/min (ref >60.00)
Glucose, Bld: 106 mg/dL — ABNORMAL HIGH (ref 70–99)
POTASSIUM: 4.1 meq/L (ref 3.5–5.1)
Sodium: 139 mEq/L (ref 135–145)

## 2014-06-21 LAB — HEMOGLOBIN A1C: HEMOGLOBIN A1C: 6.3 % (ref 4.6–6.5)

## 2014-06-21 NOTE — Progress Notes (Signed)
Pre visit review using our clinic review tool, if applicable. No additional management support is needed unless otherwise documented below in the visit note. 

## 2014-06-21 NOTE — Patient Instructions (Signed)
Get your blood work before you leave    Come back to the office in 4-5 months  for a routine check up

## 2014-06-21 NOTE — Progress Notes (Signed)
Subjective:    Patient ID: George Alvarez, male    DOB: [birth year], 69 y.o.   MRN: [id number]  DOS:  06/21/2014 Type of visit - description : f/u, several concerns Interval history:  Hypertension, medications were adjusted the last time he was here, ambulatory BPs very good mostly in the 120, 130/70s. Dizziness, saw neurology, had MRI/MRA, subsequently was referred to vestibular rehabilitation and is doing great. Likes to discuss the MRI/MRA with me. Last A1c slightly elevated, trying to do better with diet and exercise. Was referred to GI, colonoscopy is pending, needs clearance from neurology to stop anticoagulants. His insurance is arguing about the referral to GI and likes to be sure his referrals are all placed: Needs a referral to see Dr. Jeffie Pollock and   cardiology  Review of Systems  denies any fever, chills, ear pain, sinus congestion or pain at the mastoid area   Past Medical History  Diagnosis Date  . Hyperlipidemia   . GERD (gastroesophageal reflux disease)   . Perennial allergic rhinitis   . Asthma     post infectious; envirronmental triggers  . Coronary artery disease     a. 02/2012 Cath/PCI: LM 10, LAD min irregs, LCX large, OM1 sm, 40 ost, OM2 95p (5.0x16 Veriflex & 4.5x12 Veriflex BMS'), RCA 30p, 44m, 30d, PDA/PLA min irregs, EF 65%  . Hypertension   . Skin cancer     Basal and squamous cell cancers, greater than 20  . BPH (benign prostatic hyperplasia)   . Asthma, chronic 06/16/2007    Qualifier: Diagnosis of  By: Linna Darner MD, Gwyndolyn Saxon   Onset:as child Triggers (environmental, infectious, allergic): all, mainly environmental triggers Rescue inhaler HYI:FOYDXA Maintenance medications/ response:Singulair,Qvar Smoking history:never Family history pulmonary disease: no    . Benign paroxysmal positional vertigo 11/19/2012    Diagnosed at [facility] [place]  Physical therapy appointment pending   . Anal fissure   . Diverticulosis   . Fundic gland polyps of stomach,  benign 2010  . Tubular adenoma of colon 2010  . Stroke 2016    Past Surgical History  Procedure Laterality Date  . Septoplasty    . Hernia repair    . Vasectomy    . Tonsillectomy and adenoidectomy    . Trigger finger release    . Epidural steroids  2007, 2009    X 2 @ cervical &, lumbar)  . Colonoscopy  2003, 2011    negative  . Upper gastrointestinal endoscopy  2011    gastric polyps  . Coronary angioplasty with stent placement  02/06/2012    OM2  bare metal   . Left heart catheterization with coronary angiogram N/A 02/06/2012    Procedure: LEFT HEART CATHETERIZATION WITH CORONARY ANGIOGRAM;  Surgeon: [name], MD;  Location: [facility] CATH LAB;  Service: Cardiovascular;  Laterality: N/A;  . Percutaneous coronary stent intervention (pci-s)  02/06/2012    Procedure: PERCUTANEOUS CORONARY STENT INTERVENTION (PCI-S);  Surgeon: [name], MD;  Location: [facility] CATH LAB;  Service: Cardiovascular;;  . Intravascular ultrasound  02/06/2012    Procedure: INTRAVASCULAR ULTRASOUND;  Surgeon: [name], MD;  Location: [facility] CATH LAB;  Service: Cardiovascular;;    History   Social History  . Marital Status: Married    Spouse Name: N/A  . Number of Children: 3  . Years of Education: N/A   Occupational History  . 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 Smoking status: Never Smoker   .  Smokeless tobacco: Never Used  . Alcohol Use: 0.0 oz/week    0 Standard drinks or equivalent per week     Comment: Wine occasionally  . Drug Use: No  . Sexual Activity:    Partners: Female   Other Topics Concern  . Not on file   Social History Narrative   3 daughters, 74 g-children   lifes w/ wife in [place]        Medication List       This list is accurate as of: 06/21/14 11:59 PM.  Always use your most recent med list.               albuterol 108 (90 BASE) MCG/ACT inhaler  Commonly known as:  PROVENTIL HFA;VENTOLIN HFA    Inhale 2 puffs into the lungs as needed. For wheezing     beclomethasone 80 MCG/ACT inhaler  Commonly known as:  QVAR  Inhale 1 puff into the lungs 2 (two) times daily.     CRESTOR 40 MG tablet  Generic drug:  rosuvastatin  TAKE 1 TABLET BY MOUTH EVERY DAY     [employer] ALLERGY PO  Take 1 tablet by mouth daily.     EFFIENT 10 MG Tabs tablet  Generic drug:  prasugrel  TAKE 1 TABLET BY MOUTH DAILY     esomeprazole 40 MG capsule  Commonly known as:  NEXIUM  Take 40 mg by mouth daily at 12 noon. OVER THE COUNTER     losartan-hydrochlorothiazide 50-12.5 MG per tablet  Commonly known as:  HYZAAR  TAKE 1 TABLET BY MOUTH DAILY.     metoprolol tartrate 25 MG tablet  Commonly known as:  LOPRESSOR  Take 0.5 tablets (12.5 mg total) by mouth 2 (two) times daily.     mometasone 50 MCG/ACT nasal spray  Commonly known as:  NASONEX  Place 2 sprays into the nose daily.     montelukast 10 MG tablet  Commonly known as:  SINGULAIR  Take 10 mg by mouth at bedtime.     nitroGLYCERIN 0.4 MG SL tablet  Commonly known as:  NITROSTAT  Place 1 tablet (0.4 mg total) under the tongue every 5 (five) minutes as needed for chest pain.     tadalafil 5 MG tablet  Commonly known as:  CIALIS  Take 5 mg by mouth daily as needed for erectile dysfunction.           Objective:   Physical Exam BP 128/82 mmHg  Pulse 58  Temp(Src) 98.2 F (36.8 C) (Oral)  Ht 5\' 10"  (1.778 m)  Wt 230 lb 6 oz (104.497 kg)  BMI 33.06 kg/m2  SpO2 97% General:   Well developed, well nourished . NAD.  HEENT:  Normocephalic . Face symmetric, atraumatic; sinuses not TTP. Tympanic membranes normal, nose not congested. No pain to percussion at the mastoid areas Lungs:  CTA B Normal respiratory effort, no intercostal retractions, no accessory muscle use. Heart: RRR,  no murmur.  Muscle skeletal: no pretibial edema bilaterally  Skin: Not pale. Not jaundice Neurologic:  alert & oriented X3.  Speech normal, gait  appropriate for age and unassisted Psych--  Cognition and judgment appear intact.  Cooperative with normal attention span and concentration.  Behavior appropriate. No anxious or depressed appearing.       Assessment &Plan:   Prediabetes, repeat A1c, doing better with diet and exercise  Other issue: Having problems with referrals, will notify administration. I don't have control over what  referral is covered by  his insurance.  Needs a colonoscopy, needs clearance from neurology to hold anticoagulation, will send him a note  Needs a referral to urology..  F2F >> 25

## 2014-06-22 DIAGNOSIS — N400 Enlarged prostate without lower urinary tract symptoms: Secondary | ICD-10-CM | POA: Insufficient documentation

## 2014-06-22 NOTE — Assessment & Plan Note (Addendum)
Status post neurology evaluation, MRI/MRA showed a mastoid effusion and a old cerebellar infarcts. Patient has no symptoms of sinusitis/mastoiditis consequently effusion is an incidental finding. As far as his cerebral infarct, the plan is good cardiovascular risk factor control Extensive discussion about the MRI results. Addendum, I communicated with neurology, from their standpoint is okay to hold Effient before the colonoscopy, will let GI know

## 2014-06-22 NOTE — Assessment & Plan Note (Signed)
Hypertension HCTZ was changed to losartan HCT, good compliance, tolerance, better ambulatory BPs. Plan: Check a BMP

## 2014-06-22 NOTE — Assessment & Plan Note (Signed)
  Recent stress test negative, needs a referral to cardiology

## 2014-06-22 NOTE — Assessment & Plan Note (Signed)
Needs a referral to see Dr. Jeffie Pollock

## 2014-06-26 ENCOUNTER — Telehealth: Payer: Self-pay

## 2014-06-26 ENCOUNTER — Encounter: Payer: Commercial Managed Care - HMO | Admitting: Rehabilitative and Restorative Service Providers"

## 2014-06-26 DIAGNOSIS — I639 Cerebral infarction, unspecified: Secondary | ICD-10-CM | POA: Insufficient documentation

## 2014-06-26 NOTE — Telephone Encounter (Signed)
Informed patient per Dr. Tomi Likens and Dr. Angelena Form can hold Effient 5 days prior to his Colonoscopy. Pt can go ahead and schedule procedure date and nurse visit. Pt scheduled for 08/07/14 for nurse visit and 08/22/14 for Colonoscopy. Pt verbalized understanding to hold Effient 5 days before procedure.

## 2014-06-26 NOTE — Telephone Encounter (Signed)
-----   Message from Ladene Artist, MD sent at 06/26/2014  9:10 AM EDT ----- Regarding: FW: Malcom, ok to hold effient before colonoscopy from neuro stand point, thx, Jose  OK. Thx.   ----- Message -----    From: Colon Branch, MD    Sent: 06/26/2014   8:39 AM      To: Ladene Artist, MD Subject: Clayborne Dana, ok to hold effient before colonoscop#    ----- Message -----    From: Pieter Partridge, DO    Sent: 06/22/2014   8:20 PM      To: Colon Branch, MD Subject: RE: Effient                                    That would be okay. ----- Message -----    From: Colon Branch, MD    Sent: 06/22/2014   1:51 PM      To: Pieter Partridge, DO, Colon Branch, MD Subject: Chelsea Aus, needs a colonoscopy and GI was wonder if he can hold Effient from your standpoint. Thank you

## 2014-06-26 NOTE — Therapy (Signed)
[facility] [address] [place], [place], [address] Phone: [phone]   Fax:  [phone]  Patient Details  Name: George Alvarez MRN: [id number] Date of Birth: 1945/09/07 Referring Provider:  No ref. provider found  Encounter Date: 06/26/2014  PHYSICAL THERAPY DISCHARGE SUMMARY  Visits from Start of Care: 2   Current functional level related to goals / functional outcomes:     PT Long Term Goals - 06/01/14 0844    PT LONG TERM GOAL #1   Title The patient will have negative positional testing for BPPV.  Target date 06/22/2014   Baseline Met on 06/01/2014   Time 4   Period Weeks   Status Achieved   PT LONG TERM GOAL #2   Title The patient will verbalize understanding of HEP for self management of BPPV.  Target date 06/22/2014.   Baseline Met on 06/01/2014   Time 4   Period Weeks   Status Achieved   PT LONG TERM GOAL #3   Title The patient will improve DHI from 22% down to 12% to demo improving subjective sensation of dizziness.  Target date 06/22/2014.   Time 4   Period Weeks   Status On-going        Remaining deficits: Patient was doing significantly better at 2nd session.  We scheduled 1 more f/u in 3 weeks and patient cancelled due to not needing.   Education / Equipment: HEP.  Plan: Patient agrees to discharge.  Patient goals were met. Patient is being discharged due to meeting the stated rehab goals.  ?????    Thank you for the referral of this patient.     [place] , PT  06/26/2014, 2:17 PM  [facility] [address] [place], [place], [address] Phone: [phone]   Fax:  [phone]

## 2014-08-01 ENCOUNTER — Institutional Professional Consult (permissible substitution): Payer: Commercial Managed Care - HMO | Admitting: Interventional Cardiology

## 2014-08-07 ENCOUNTER — Ambulatory Visit (AMBULATORY_SURGERY_CENTER): Payer: Self-pay | Admitting: *Deleted

## 2014-08-07 VITALS — Ht 70.0 in | Wt 227.0 lb

## 2014-08-07 DIAGNOSIS — Z86010 Personal history of colonic polyps: Secondary | ICD-10-CM

## 2014-08-07 MED ORDER — NA SULFATE-K SULFATE-MG SULF 17.5-3.13-1.6 GM/177ML PO SOLN: ORAL | Status: DC

## 2014-08-07 NOTE — Progress Notes (Signed)
Patient denies any allergies to eggs or soy. Patient denies any problems with anesthesia/sedation. Patient denies any oxygen use at home and does not take any diet/weight loss medications. EMMI education assisgned to patient on colonoscopy, this was explained and instructions given to patient. Patient aware to hold Effient 5 days prior to procedure, last dose 08-16-14.

## 2014-08-10 ENCOUNTER — Encounter: Payer: Self-pay | Admitting: Gastroenterology

## 2014-08-22 ENCOUNTER — Other Ambulatory Visit: Payer: Self-pay | Admitting: Gastroenterology

## 2014-08-22 ENCOUNTER — Ambulatory Visit (AMBULATORY_SURGERY_CENTER): Payer: Commercial Managed Care - HMO | Admitting: Gastroenterology

## 2014-08-22 ENCOUNTER — Encounter: Payer: Self-pay | Admitting: Gastroenterology

## 2014-08-22 VITALS — BP 126/59 | HR 61 | Temp 97.2°F | Resp 15 | Ht 70.0 in | Wt 227.0 lb

## 2014-08-22 DIAGNOSIS — D122 Benign neoplasm of ascending colon: Secondary | ICD-10-CM | POA: Diagnosis not present

## 2014-08-22 DIAGNOSIS — D124 Benign neoplasm of descending colon: Secondary | ICD-10-CM | POA: Diagnosis not present

## 2014-08-22 DIAGNOSIS — Z86010 Personal history of colonic polyps: Secondary | ICD-10-CM

## 2014-08-22 MED ORDER — SODIUM CHLORIDE 0.9 % IV SOLN
500.0000 mL | INTRAVENOUS | Status: DC
Start: 2014-08-22 — End: 2014-08-22

## 2014-08-22 NOTE — Progress Notes (Signed)
Report to PACU, RN, vss, BBS= Clear.  

## 2014-08-22 NOTE — Op Note (Signed)
[facility]  [address] [place], [id number]   COLONOSCOPY PROCEDURE REPORT  PATIENT: George Alvarez, George Alvarez  MR#: [id number] BIRTHDATE: Oct 27, 1945 , 68  yrs. old GENDER: male ENDOSCOPIST: Ladene Artist, MD, [facility] PROCEDURE DATE:  08/22/2014 PROCEDURE:   Colonoscopy, surveillance and Colonoscopy with snare polypectomy First Screening Colonoscopy - Avg.  risk and is 50 yrs.  old or older - No.  Prior Negative Screening - Now for repeat screening. N/A  History of Adenoma - Now for follow-up colonoscopy & has been > or = to 3 yrs.  Yes hx of adenoma.  Has been 3 or more years since last colonoscopy.  Polyps removed today? Yes ASA CLASS:   Class III INDICATIONS:Surveillance due to prior colonic neoplasia and PH Colon Adenoma. MEDICATIONS: Monitored anesthesia care and Propofol 200 mg IV DESCRIPTION OF PROCEDURE:   After the risks benefits and alternatives of the procedure were thoroughly explained, informed consent was obtained.  The digital rectal exam revealed no abnormalities of the rectum.   The LB [id number]  endoscope was introduced through the anus and advanced to the cecum, which was identified by both the appendix and ileocecal valve. No adverse events experienced.   The quality of the prep was excellent. (Suprep was used)  The instrument was then slowly withdrawn as the colon was fully examined.    COLON FINDINGS: Two sessile polyps measuring 5 mm in size were found in the descending colon and ascending colon.  Polypectomies were performed with a cold snare.  The resection was complete, the polyp tissue was completely retrieved and sent to histology.   There was mild diverticulosis noted in the sigmoid colon and descending colon.   The examination was otherwise normal.  Retroflexed views revealed internal Grade I hemorrhoids. The time to cecum = 1.3 Withdrawal time = 11.0   The scope was withdrawn and the procedure completed. COMPLICATIONS: There  were no immediate complications.  ENDOSCOPIC IMPRESSION: 1.   Two sessile polyps in the descending colon and ascending colon; polypectomies performed with a cold snare 2.   Mild diverticulosis in the sigmoid colon and descending colon 3.   Grade l internal hemorrhoids  RECOMMENDATIONS: 1.  Await pathology results 2.  High fiber diet with liberal fluid intake. 3.  Repeat Colonoscopy in 5 years. 4.  Resume Effient tomorrow  eSigned:  Ladene Artist, MD, [facility] 08/22/2014 8:34 AM

## 2014-08-22 NOTE — Progress Notes (Signed)
Called to room to assist during endoscopic procedure.  Patient ID and intended procedure confirmed with present staff. Received instructions for my participation in the procedure from the performing physician.  

## 2014-08-22 NOTE — Progress Notes (Signed)
No complaints noted in the recovery room. maw

## 2014-08-22 NOTE — Patient Instructions (Signed)
YOU HAD AN ENDOSCOPIC PROCEDURE TODAY AT [facility]:   Refer to the procedure report that was given to you for any specific questions about what was found during the examination.  If the procedure report does not answer your questions, please call your gastroenterologist to clarify.  If you requested that your care partner not be given the details of your procedure findings, then the procedure report has been included in a sealed envelope for you to review at your convenience later.  YOU SHOULD EXPECT: Some feelings of bloating in the abdomen. Passage of more gas than usual.  Walking can help get rid of the air that was put into your GI tract during the procedure and reduce the bloating. If you had a lower endoscopy (such as a colonoscopy or flexible sigmoidoscopy) you may notice spotting of blood in your stool or on the toilet paper. If you underwent a bowel prep for your procedure, you may not have a normal bowel movement for a few days.  Please Note:  You might notice some irritation and congestion in your nose or some drainage.  This is from the oxygen used during your procedure.  There is no need for concern and it should clear up in a day or so.  SYMPTOMS TO REPORT IMMEDIATELY:   Following lower endoscopy (colonoscopy or flexible sigmoidoscopy):  Excessive amounts of blood in the stool  Significant tenderness or worsening of abdominal pains  Swelling of the abdomen that is new, acute  Fever of 100F or higher   For urgent or emergent issues, a gastroenterologist can be reached at any hour by calling [phone].   DIET: Your first meal following the procedure should be a small meal and then it is ok to progress to your normal diet. Heavy or fried foods are harder to digest and may make you feel nauseous or bloated.  Likewise, meals heavy in dairy and vegetables can increase bloating.  Drink plenty of fluids but you should avoid alcoholic beverages for 24  hours.  ACTIVITY:  You should plan to take it easy for the rest of today and you should NOT DRIVE or use heavy machinery until tomorrow (because of the sedation medicines used during the test).    FOLLOW UP: Our staff will call the number listed on your records the next business day following your procedure to check on you and address any questions or concerns that you may have regarding the information given to you following your procedure. If we do not reach you, we will leave a message.  However, if you are feeling well and you are not experiencing any problems, there is no need to return our call.  We will assume that you have returned to your regular daily activities without incident.  If any biopsies were taken you will be contacted by phone or by letter within the next 1-3 weeks.  Please call us at [phone] if you have not heard about the biopsies in 3 weeks.    SIGNATURES/CONFIDENTIALITY: You and/or your care partner have signed paperwork which will be entered into your electronic medical record.  These signatures attest to the fact that that the information above on your After Visit Summary has been reviewed and is understood.  Full responsibility of the confidentiality of this discharge information lies with you and/or your care-partner.    Handouts were given to your care partner on polyps, diverticulosis, hemorrhoids, and a high fiber diet with liberal fluid intake. You may  resume your current medications today.  Resume your effient tomorrow 08-23-14. Await biopsy results. Please call if any questions or concerns.

## 2014-08-23 ENCOUNTER — Telehealth: Payer: Self-pay

## 2014-08-23 NOTE — Telephone Encounter (Signed)
  Follow up Call-  Call back number 08/22/2014  Post procedure Call Back phone  # [phone]  Permission to leave phone message Yes     Patient questions:  Do you have a fever, pain , or abdominal swelling? No. Pain Score  0 *  Have you tolerated food without any problems? Yes.    Have you been able to return to your normal activities? Yes.    Do you have any questions about your discharge instructions: Diet   No. Medications  No. Follow up visit  No.  Do you have questions or concerns about your Care? No.  Actions: * If pain score is 4 or above: No action needed, pain <4.

## 2014-08-30 ENCOUNTER — Encounter: Payer: Self-pay | Admitting: Gastroenterology

## 2014-09-03 ENCOUNTER — Other Ambulatory Visit: Payer: Self-pay | Admitting: Internal Medicine

## 2014-09-05 ENCOUNTER — Other Ambulatory Visit: Payer: Self-pay

## 2014-09-05 NOTE — Telephone Encounter (Signed)
E-prescribing down, Rx printed and faxed to [facility].

## 2014-09-13 ENCOUNTER — Other Ambulatory Visit: Payer: Self-pay | Admitting: Internal Medicine

## 2014-09-14 ENCOUNTER — Ambulatory Visit: Payer: Commercial Managed Care - HMO | Admitting: Cardiovascular Disease

## 2014-09-24 ENCOUNTER — Other Ambulatory Visit: Payer: Self-pay | Admitting: Internal Medicine

## 2014-10-19 LAB — PSA: PSA: 2.16

## 2014-10-24 ENCOUNTER — Encounter: Payer: Self-pay | Admitting: Internal Medicine

## 2014-10-24 ENCOUNTER — Other Ambulatory Visit: Payer: Self-pay

## 2014-10-24 DIAGNOSIS — Z85828 Personal history of other malignant neoplasm of skin: Secondary | ICD-10-CM

## 2014-10-24 DIAGNOSIS — M545 Low back pain: Secondary | ICD-10-CM

## 2014-10-24 DIAGNOSIS — M25569 Pain in unspecified knee: Secondary | ICD-10-CM

## 2014-11-02 ENCOUNTER — Other Ambulatory Visit: Payer: Self-pay

## 2014-11-03 ENCOUNTER — Other Ambulatory Visit: Payer: Self-pay | Admitting: Cardiovascular Disease

## 2014-11-18 ENCOUNTER — Encounter: Payer: Self-pay | Admitting: Internal Medicine

## 2014-11-24 ENCOUNTER — Encounter: Payer: Self-pay | Admitting: Internal Medicine

## 2014-12-20 ENCOUNTER — Ambulatory Visit (INDEPENDENT_AMBULATORY_CARE_PROVIDER_SITE_OTHER): Payer: Commercial Managed Care - HMO | Admitting: Cardiovascular Disease

## 2014-12-20 ENCOUNTER — Encounter: Payer: Self-pay | Admitting: Cardiovascular Disease

## 2014-12-20 VITALS — BP 110/64 | HR 56 | Ht 70.0 in | Wt 221.0 lb

## 2014-12-20 DIAGNOSIS — I2510 Atherosclerotic heart disease of native coronary artery without angina pectoris: Secondary | ICD-10-CM

## 2014-12-20 DIAGNOSIS — I10 Essential (primary) hypertension: Secondary | ICD-10-CM | POA: Diagnosis not present

## 2014-12-20 DIAGNOSIS — E785 Hyperlipidemia, unspecified: Secondary | ICD-10-CM

## 2014-12-20 MED ORDER — PRASUGREL HCL 10 MG PO TABS: 10.0000 mg | ORAL_TABLET | Freq: Every day | ORAL | Status: DC

## 2014-12-20 NOTE — Patient Instructions (Addendum)
Medication Instructions:  Your physician recommends that you continue on your current medications as directed. Please refer to the Current Medication list given to you today.   Labwork: none  Testing/Procedures: none  Follow-Up: Your physician wants you to follow-up in:  12 months.  You will receive a reminder letter in the mail two months in advance. If you don't receive a letter, please call our office to schedule the follow-up appointment.        

## 2014-12-20 NOTE — Progress Notes (Signed)
\   Chief Complaint  Patient presents with  . Snoring     History of Present Illness: 69 yo male with history of HTN, HLD, CAD who is here today for follow up. He underwent a stress test on 02/05/12 which was suggestive of ischemia. Cardiac cath on 02/06/12 with 95% proximal OM2 lesion and a 60% mid RCA lesion. EF was 65%. His OM2 was very large in caliber and was treated with bare-metal stents x 2. Medical management of RCA stenosis which was felt to be moderate. Patient was placed on Effient only due to his history of intolerance to aspirin. He tried switching to Plavix and Protonix but he did not like taking the Protonix. It did not help his GERD so he was changed back to Effient. He has chronic dizziness and is felt to have vertigo. Normal stress myoview December 2015.  He is here today for follow up. Denies chest pain, shortness of breath, syncope, orthopnea, PND or edema. He is exercising several days per week.   Primary Care Physician: Larose Kells  Last Lipid Profile:Lipid Panel     Component Value Date/Time   CHOL 119 03/13/2014 0809   TRIG 168.0* 03/13/2014 0809   HDL 32.10* 03/13/2014 0809   CHOLHDL 4 03/13/2014 0809   VLDL 33.6 03/13/2014 0809   LDLCALC 53 03/13/2014 0809   Past Medical History  Diagnosis Date  . Hyperlipidemia   . GERD (gastroesophageal reflux disease)   . Perennial allergic rhinitis   . Asthma     post infectious; envirronmental triggers  . Coronary artery disease     a. 02/2012 Cath/PCI: LM 10, LAD min irregs, LCX large, OM1 sm, 40 ost, OM2 95p (5.0x16 Veriflex & 4.5x12 Veriflex BMS'), RCA 30p, 66m, 30d, PDA/PLA min irregs, EF 65%  . Hypertension   . Skin cancer     Basal and squamous cell cancers, greater than 20  . BPH (benign prostatic hyperplasia)     With urinary obstruction  . Asthma, chronic 06/16/2007    Qualifier: Diagnosis of  By: Linna Darner MD, Gwyndolyn Saxon   Onset:as child Triggers (environmental, infectious, allergic): all, mainly environmental triggers  Rescue inhaler TDV:VOHYWV Maintenance medications/ response:Singulair,Qvar Smoking history:never Family history pulmonary disease: no    . Benign paroxysmal positional vertigo 11/19/2012    Diagnosed at [facility], [place]  Physical therapy appointment pending   . Anal fissure   . Diverticulosis   . Fundic gland polyps of stomach, benign 2010  . Tubular adenoma of colon 2010  . Stroke 2016  . Erectile dysfunction due to arterial insufficiency   . History of elevated PSA   . Nocturia   . Peyronie's disease     Past Surgical History  Procedure Laterality Date  . Septoplasty    . Hernia repair    . Vasectomy    . Tonsillectomy and adenoidectomy    . Trigger finger release    . Epidural steroids  2007, 2009    X 2 @ cervical &, lumbar)  . Colonoscopy  2003, 2011    negative  . Upper gastrointestinal endoscopy  2011    gastric polyps  . Coronary angioplasty with stent placement  02/06/2012    OM2  bare metal   . Left heart catheterization with coronary angiogram N/A 02/06/2012    Procedure: LEFT HEART CATHETERIZATION WITH CORONARY ANGIOGRAM;  Surgeon: [name], MD;  Location: [facility] CATH LAB;  Service: Cardiovascular;  Laterality: N/A;  . Percutaneous coronary stent intervention (pci-s)  02/06/2012  Procedure: PERCUTANEOUS CORONARY STENT INTERVENTION (PCI-S);  Surgeon: [name], MD;  Location: [facility] CATH LAB;  Service: Cardiovascular;;  . Intravascular ultrasound  02/06/2012    Procedure: INTRAVASCULAR ULTRASOUND;  Surgeon: [name], MD;  Location: [facility] CATH LAB;  Service: Cardiovascular;;    Current Outpatient Prescriptions  Medication Sig Dispense Refill  . albuterol (PROVENTIL HFA;VENTOLIN HFA) 108 (90 BASE) MCG/ACT inhaler Inhale 2 puffs into the lungs as needed. For wheezing    . beclomethasone (QVAR) 80 MCG/ACT inhaler Inhale 1 puff into the lungs 2 (two) times daily. 3 Inhaler 2  . Chlorpheniramine Maleate ([facility] ALLERGY PO) Take 1 tablet  by mouth daily.     Marland Kitchen esomeprazole (NEXIUM) 40 MG capsule Take 40 mg by mouth daily at 12 noon. OVER THE COUNTER    . losartan-hydrochlorothiazide (HYZAAR) 50-12.5 MG per tablet Take 1 tablet by mouth daily. 30 tablet 3  . metoprolol tartrate (LOPRESSOR) 25 MG tablet Take 0.5 tablets (12.5 mg total) by mouth 2 (two) times daily. 90 tablet 0  . mometasone (NASONEX) 50 MCG/ACT nasal spray Place 2 sprays into the nose daily.     . montelukast (SINGULAIR) 10 MG tablet Take 10 mg by mouth at bedtime.    . nitroGLYCERIN (NITROSTAT) 0.4 MG SL tablet Place 1 tablet (0.4 mg total) under the tongue every 5 (five) minutes as needed for chest pain. (Patient not taking: Reported on 06/21/2014) 25 tablet 6  . prasugrel (EFFIENT) 10 MG TABS tablet Take 1 tablet (10 mg total) by mouth daily. 90 tablet 3  . rosuvastatin (CRESTOR) 40 MG tablet Take 1 tablet (40 mg total) by mouth daily. 90 tablet 0  . tadalafil (CIALIS) 5 MG tablet Take 5 mg by mouth daily as needed for erectile dysfunction.     No current facility-administered medications for this visit.    Allergies  Allergen Reactions  . Aspirin     Angioedema in 1980s Medi Alert bracelet  is recommended  . Nifedipine Rash    edema    Social History   Social History  . Marital Status: Married    Spouse Name: N/A  . Number of Children: 3  . Years of Education: N/A   Occupational History  . 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 Smoking status: Never Smoker   . Smokeless tobacco: Never Used  . Alcohol Use: 0.0 oz/week    0 Standard drinks or equivalent per week     Comment: Wine occasionally  . Drug Use: No  . Sexual Activity:    Partners: Female   Other Topics Concern  . Not on file   Social History Narrative   3 daughters, 79 g-children   lifes w/ wife in [place]    Family History  Problem Relation Age of Onset  . Heart attack Father 1    died @ 24  . Heart disease Mother   . Breast cancer  Mother   . Esophageal cancer Mother     Barrett's  . Heart attack Maternal Grandmother     after 65  . Dementia Maternal Grandmother     CVA  . Breast cancer Maternal Grandmother   . Diabetes Maternal Grandmother   . Stroke Maternal Grandmother     in 49s  . Heart attack Paternal Uncle 72  . Breast cancer Maternal Aunt   . Colon cancer Neg Hx   . Prostate cancer Neg Hx   . Colon polyps Neg Hx  Review of Systems:  As stated in the HPI and otherwise negative.   BP 110/64 mmHg  Pulse 56  Ht 5\' 10"  (1.778 m)  Wt 221 lb (100.245 kg)  BMI 31.71 kg/m2  SpO2 96%  Physical Examination: General: Well developed, well nourished, NAD HEENT: OP clear, mucus membranes moist SKIN: warm, dry. No rashes. Neuro: No focal deficits Musculoskeletal: Muscle strength 5/5 all ext Psychiatric: Mood and affect normal Neck: No JVD, no carotid bruits, no thyromegaly, no lymphadenopathy. Lungs:Clear bilaterally, no wheezes, rhonci, crackles Cardiovascular: Regular rate and rhythm. No murmurs, gallops or rubs. Abdomen:Soft. Bowel sounds present. Non-tender.  Extremities: No lower extremity edema. Pulses are 2 + in the bilateral DP/PT.  Cardiac cath 02/06/12: Left main: 10% distal stenosis.  Left Anterior Descending Artery: Large caliber vessel that courses to the apex with mild luminal irregularities proximal and distal.  Circumflex Artery: Very large caliber vessel. The first OM is small and has an ostial 40% stenosis. The second OM is very large (5.73mm) and has a 95% stenosis in the proximal portion of the vessel.  Right Coronary Artery: Large, dominant artery with 30% proximal stenosis, 60% mid stenosis, diffuse 30% distal disease. The PDA and PLA are patent with mild plaque disease.  Left Ventricular Angiogram: LVEF=65%  Impression:  1. Double vessel CAD  2. Unstable angina  3. Normal LV systolic function  4. Successful PTCA/bare metal stent x 1 OM2.   EKG:  EKG is not ordered today. The  ekg ordered today demonstrates   Recent Labs: 03/13/2014: ALT 27; Hemoglobin 14.9; Platelets 147.0*; TSH 3.65 06/21/2014: BUN 19; Creatinine, Ser 1.08; Potassium 4.1; Sodium 139   Lipid Panel    Component Value Date/Time   CHOL 119 03/13/2014 0809   TRIG 168.0* 03/13/2014 0809   HDL 32.10* 03/13/2014 0809   CHOLHDL 4 03/13/2014 0809   VLDL 33.6 03/13/2014 0809   LDLCALC 53 03/13/2014 0809   LDLDIRECT 94.9 09/24/2012 0822     Wt Readings from Last 3 Encounters:  12/20/14 221 lb (100.245 kg)  08/22/14 227 lb (102.967 kg)  08/07/14 227 lb (102.967 kg)     Other studies Reviewed: Additional studies/ records that were reviewed today include: . Review of the above records demonstrates:    Assessment and Plan:   1. CAD: Stable. Continue Effient. He is intolerant of ASA. He tried Plavix but needed to change to Protonix with addition of Plavix and did not like the protonix so Effient was resumed. Continue beta blocker and statin. Normal stress myoview December 2015.  2. HTN: BP is controlled. No changes.   3. Hyperlipidemia: Will continue statin. Lipids well controlled.    4. Dizziness: Likely vertigo. No carotid bruits. HR is 54 bpm but his dizziness is related to head position changes.   Current medicines are reviewed at length with the patient today.  The patient does not have concerns regarding medicines.  The following changes have been made:  no change  Labs/ tests ordered today include:  No orders of the defined types were placed in this encounter.    Disposition:   FU with me in 12  months  Signed, Lauree Chandler, MD 12/20/2014 9:32 AM    [facility] [place], [place], [place]  [address] Phone: [phone]; Fax: [phone]

## 2014-12-21 ENCOUNTER — Encounter: Payer: Self-pay | Admitting: Internal Medicine

## 2014-12-24 ENCOUNTER — Other Ambulatory Visit: Payer: Self-pay | Admitting: Internal Medicine

## 2015-01-09 ENCOUNTER — Ambulatory Visit (INDEPENDENT_AMBULATORY_CARE_PROVIDER_SITE_OTHER): Payer: Commercial Managed Care - HMO | Admitting: Internal Medicine

## 2015-01-09 ENCOUNTER — Encounter: Payer: Self-pay | Admitting: Internal Medicine

## 2015-01-09 VITALS — BP 104/72 | HR 67 | Temp 97.9°F | Ht 70.0 in | Wt 218.4 lb

## 2015-01-09 DIAGNOSIS — I10 Essential (primary) hypertension: Secondary | ICD-10-CM

## 2015-01-09 DIAGNOSIS — M4800 Spinal stenosis, site unspecified: Secondary | ICD-10-CM

## 2015-01-09 DIAGNOSIS — R7303 Prediabetes: Secondary | ICD-10-CM

## 2015-01-09 DIAGNOSIS — E1159 Type 2 diabetes mellitus with other circulatory complications: Secondary | ICD-10-CM

## 2015-01-09 DIAGNOSIS — J45909 Unspecified asthma, uncomplicated: Secondary | ICD-10-CM

## 2015-01-09 DIAGNOSIS — Z09 Encounter for follow-up examination after completed treatment for conditions other than malignant neoplasm: Secondary | ICD-10-CM

## 2015-01-09 MED ORDER — METOPROLOL TARTRATE 25 MG PO TABS: 12.5000 mg | ORAL_TABLET | Freq: Two times a day (BID) | ORAL | Status: DC

## 2015-01-09 MED ORDER — LOSARTAN POTASSIUM-HCTZ 50-12.5 MG PO TABS: 1.0000 | ORAL_TABLET | Freq: Every day | ORAL | Status: DC

## 2015-01-09 MED ORDER — ROSUVASTATIN CALCIUM 40 MG PO TABS: 40.0000 mg | ORAL_TABLET | Freq: Every day | ORAL | Status: DC

## 2015-01-09 NOTE — Patient Instructions (Signed)
Get your blood work before you leave   Check the  blood pressure 2 or 3 times a week Be sure your blood pressure is between 110/65 and  145/85. If your blood pressure is consistently   less than 100 or you have symptoms (weakness, dizziness) of low BP let me know, we'll have to adjust your medication.    Next visit  for a  physical exam in 4-6 months Please schedule an appointment at the front desk Please come back fasting   Scheduler: Please arrange a office visit for the patient's wife, she's a new patient

## 2015-01-09 NOTE — Progress Notes (Signed)
Pre visit review using our clinic review tool, if applicable. No additional management support is needed unless otherwise documented below in the visit note. 

## 2015-01-09 NOTE — Progress Notes (Addendum)
Subjective:    Patient ID: George Alvarez, male    DOB: September 07, 1945, 69 y.o.   MRN: [id number]  DOS:  01/09/2015 Type of visit - description : Routine checkup Interval history: CAD: Saw cardiology recently, felt to be stable HTN: Good compliance of medication, BPs has been dropping steadily, currently around 110/70. DJD: Saw Dr. Len Childs  for knee pain, s/p local injection,  better. History of spinal stenosis, request a referral. Has on and off problems  with back pain, worse over the last year, usually radiates to the buttocks and lower extremities.   Review of Systems Denies chest pain or   difficulty breathing No nausea, vomiting, diarrhea  Past Medical History  Diagnosis Date  . Hyperlipidemia   . GERD (gastroesophageal reflux disease)   . Perennial allergic rhinitis   . Asthma     post infectious; envirronmental triggers  . Coronary artery disease     a. 02/2012 Cath/PCI: LM 10, LAD min irregs, LCX large, OM1 sm, 40 ost, OM2 95p (5.0x16 Veriflex & 4.5x12 Veriflex BMS'), RCA 30p, 54m, 30d, PDA/PLA min irregs, EF 65%  . Hypertension   . Skin cancer     Basal and squamous cell cancers, greater than 20  . BPH (benign prostatic hyperplasia)     With urinary obstruction  . Asthma, chronic 06/16/2007    Qualifier: Diagnosis of  By: Linna Darner MD, Gwyndolyn Saxon   Onset:as child Triggers (environmental, infectious, allergic): all, mainly environmental triggers Rescue inhaler UXN:ATFTDD Maintenance medications/ response:Singulair,Qvar Smoking history:never Family history pulmonary disease: no    . Benign paroxysmal positional vertigo 11/19/2012    Diagnosed at [facility], [place]  Physical therapy appointment pending   . Anal fissure   . Diverticulosis   . Fundic gland polyps of stomach, benign 2010  . Tubular adenoma of colon 2010  . Stroke ([place]) 2016  . Erectile dysfunction due to arterial insufficiency   . History of elevated PSA   . Nocturia   . Peyronie's disease     Past  Surgical History  Procedure Laterality Date  . Septoplasty    . Hernia repair    . Vasectomy    . Tonsillectomy and adenoidectomy    . Trigger finger release    . Epidural steroids  2007, 2009    X 2 @ cervical &, lumbar)  . Colonoscopy  2003, 2011    negative  . Upper gastrointestinal endoscopy  2011    gastric polyps  . Coronary angioplasty with stent placement  02/06/2012    OM2  bare metal   . Left heart catheterization with coronary angiogram N/A 02/06/2012    Procedure: LEFT HEART CATHETERIZATION WITH CORONARY ANGIOGRAM;  Surgeon: [name], MD;  Location: [facility] CATH LAB;  Service: Cardiovascular;  Laterality: N/A;  . Percutaneous coronary stent intervention (pci-s)  02/06/2012    Procedure: PERCUTANEOUS CORONARY STENT INTERVENTION (PCI-S);  Surgeon: [name], MD;  Location: [facility] CATH LAB;  Service: Cardiovascular;;  . Intravascular ultrasound  02/06/2012    Procedure: INTRAVASCULAR ULTRASOUND;  Surgeon: [name], MD;  Location: [facility] CATH LAB;  Service: Cardiovascular;;    Social History   Social History  . Marital Status: Married    Spouse Name: N/A  . Number of Children: 3  . Years of Education: N/A   Occupational History  . 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 Smoking status: Never Smoker   . Smokeless tobacco: Never Used  .  Alcohol Use: 0.0 oz/week    0 Standard drinks or equivalent per week     Comment: Wine occasionally  . Drug Use: No  . Sexual Activity:    Partners: Female   Other Topics Concern  . Not on file   Social History Narrative   3 daughters, 46 g-children   lifes w/ wife in [place]        Medication List       This list is accurate as of: 01/09/15  1:41 PM.  Always use your most recent med list.               albuterol 108 (90 BASE) MCG/ACT inhaler  Commonly known as:  PROVENTIL HFA;VENTOLIN HFA  Inhale 2 puffs into the lungs as needed. For wheezing      beclomethasone 80 MCG/ACT inhaler  Commonly known as:  QVAR  Inhale 1 puff into the lungs 2 (two) times daily.     [employer] ALLERGY PO  Take 1 tablet by mouth daily.     esomeprazole 40 MG capsule  Commonly known as:  NEXIUM  Take 40 mg by mouth daily at 12 noon. OVER THE COUNTER     losartan-hydrochlorothiazide 50-12.5 MG tablet  Commonly known as:  HYZAAR  Take 1 tablet by mouth daily.     metoprolol tartrate 25 MG tablet  Commonly known as:  LOPRESSOR  Take 0.5 tablets (12.5 mg total) by mouth 2 (two) times daily.     mometasone 50 MCG/ACT nasal spray  Commonly known as:  NASONEX  Place 2 sprays into the nose daily.     montelukast 10 MG tablet  Commonly known as:  SINGULAIR  Take 10 mg by mouth at bedtime.     nitroGLYCERIN 0.4 MG SL tablet  Commonly known as:  NITROSTAT  Place 1 tablet (0.4 mg total) under the tongue every 5 (five) minutes as needed for chest pain.     prasugrel 10 MG Tabs tablet  Commonly known as:  EFFIENT  Take 1 tablet (10 mg total) by mouth daily.     rosuvastatin 40 MG tablet  Commonly known as:  CRESTOR  Take 1 tablet (40 mg total) by mouth daily.     tadalafil 5 MG tablet  Commonly known as:  CIALIS  Take 5 mg by mouth daily as needed for erectile dysfunction.           Objective:   Physical Exam BP 104/72 mmHg  Pulse 67  Temp(Src) 97.9 F (36.6 C) (Oral)  Ht 5\' 10"  (1.778 m)  Wt 218 lb 6 oz (99.054 kg)  BMI 31.33 kg/m2  SpO2 98% General:   Well developed, well nourished . NAD.  HEENT:  Normocephalic . Face symmetric, atraumatic Lungs:  CTA B Normal respiratory effort, no intercostal retractions, no accessory muscle use. Heart: RRR,  no murmur.  No pretibial edema bilaterally  Skin: Not pale. Not jaundice Neurologic:  alert & oriented X3.  Speech normal, gait appropriate for age and unassisted Psych--  Cognition and judgment appear intact.  Cooperative with normal attention span and concentration.  Behavior  appropriate. No anxious or depressed appearing.        Assessment> Prediabetes HTN Hyperlipidemia CV: --CAD --Stroke asthma MSK --DJD knees saw Dr Len Childs 2016, local injections --Spinal stenosis (neck-back) : dx ~ 2008 via MRI neck, back, had local injections then GERD Benign positional vertigo Elevated PSA -- Dr Jeffie Pollock Peyronie's  Disease Skin cancer : [facility] Dr Susie Cassette  Plan: CAD: Stable HTN: Check a BMP, he is getting slightly low, will have to adjust his medication if needed. See instructions prediabetes: Check A1c Spinal stenosis: Diagnosed few years ago, having back pain on and off, request a referral. Refer to neurosurgery. Asthma: needs a referral to see Dr. Darcey Nora Hyperlipidemia: Well controlled, request a refill on Crestor. RTC 4-6 months for a physical

## 2015-01-10 LAB — BASIC METABOLIC PANEL
BUN: 19 mg/dL (ref 6–23)
CALCIUM: 9.6 mg/dL (ref 8.4–10.5)
CO2: 24 mEq/L (ref 19–32)
CREATININE: 1.08 mg/dL (ref 0.40–1.50)
Chloride: 104 mEq/L (ref 96–112)
GFR: 72.51 mL/min (ref >60.00)
Glucose, Bld: 103 mg/dL — ABNORMAL HIGH (ref 70–99)
Potassium: 3.8 mEq/L (ref 3.5–5.1)
Sodium: 139 mEq/L (ref 135–145)

## 2015-01-10 LAB — HEMOGLOBIN A1C: HEMOGLOBIN A1C: 6.2 % (ref 4.6–6.5)

## 2015-01-11 ENCOUNTER — Other Ambulatory Visit: Payer: Self-pay | Admitting: Internal Medicine

## 2015-01-11 DIAGNOSIS — Z09 Encounter for follow-up examination after completed treatment for conditions other than malignant neoplasm: Secondary | ICD-10-CM | POA: Insufficient documentation

## 2015-01-11 NOTE — Assessment & Plan Note (Signed)
CAD: Stable HTN: Check a BMP, he is getting slightly low, will have to adjust his medication if needed. See instructions prediabetes: Check A1c Spinal stenosis: Diagnosed few years ago, having back pain on and off, request a referral. Refer to neurosurgery. Asthma: needs a referral to see Dr. Darcey Nora Hyperlipidemia: Well controlled, request a refill on Crestor. RTC 4-6 months for a physical

## 2015-01-12 ENCOUNTER — Other Ambulatory Visit (HOSPITAL_BASED_OUTPATIENT_CLINIC_OR_DEPARTMENT_OTHER): Payer: Self-pay | Admitting: Neurosurgery

## 2015-01-12 DIAGNOSIS — M48061 Spinal stenosis, lumbar region without neurogenic claudication: Secondary | ICD-10-CM

## 2015-01-20 ENCOUNTER — Ambulatory Visit (HOSPITAL_BASED_OUTPATIENT_CLINIC_OR_DEPARTMENT_OTHER): Admission: RE | Admit: 2015-01-20 | Payer: Commercial Managed Care - HMO | Source: Ambulatory Visit

## 2015-02-03 ENCOUNTER — Ambulatory Visit (HOSPITAL_BASED_OUTPATIENT_CLINIC_OR_DEPARTMENT_OTHER)
Admission: RE | Admit: 2015-02-03 | Discharge: 2015-02-03 | Disposition: A | Discharge status: Home / Self Care | Payer: Commercial Managed Care - HMO | Source: Ambulatory Visit | Attending: Neurosurgery | Admitting: Neurosurgery

## 2015-02-03 DIAGNOSIS — G548 Other nerve root and plexus disorders: Secondary | ICD-10-CM | POA: Diagnosis not present

## 2015-02-03 DIAGNOSIS — M4806 Spinal stenosis, lumbar region: Secondary | ICD-10-CM | POA: Insufficient documentation

## 2015-02-03 DIAGNOSIS — M5116 Intervertebral disc disorders with radiculopathy, lumbar region: Secondary | ICD-10-CM | POA: Insufficient documentation

## 2015-02-03 DIAGNOSIS — M8938 Hypertrophy of bone, other site: Secondary | ICD-10-CM | POA: Insufficient documentation

## 2015-02-03 DIAGNOSIS — M48061 Spinal stenosis, lumbar region without neurogenic claudication: Secondary | ICD-10-CM

## 2015-02-06 ENCOUNTER — Ambulatory Visit: Payer: Self-pay | Admitting: Pediatrics

## 2015-02-20 ENCOUNTER — Encounter: Payer: Self-pay | Admitting: Pediatrics

## 2015-02-20 ENCOUNTER — Ambulatory Visit (INDEPENDENT_AMBULATORY_CARE_PROVIDER_SITE_OTHER): Payer: Commercial Managed Care - HMO | Admitting: Pediatrics

## 2015-02-20 VITALS — BP 110/80 | HR 60 | Temp 98.2°F | Resp 16 | Ht 69.8 in | Wt 220.0 lb

## 2015-02-20 DIAGNOSIS — J3089 Other allergic rhinitis: Secondary | ICD-10-CM | POA: Insufficient documentation

## 2015-02-20 DIAGNOSIS — J302 Other seasonal allergic rhinitis: Secondary | ICD-10-CM | POA: Insufficient documentation

## 2015-02-20 DIAGNOSIS — J4540 Moderate persistent asthma, uncomplicated: Secondary | ICD-10-CM | POA: Diagnosis not present

## 2015-02-20 MED ORDER — MONTELUKAST SODIUM 10 MG PO TABS: 10.0000 mg | ORAL_TABLET | Freq: Every day | ORAL | Status: DC

## 2015-02-20 MED ORDER — ALBUTEROL SULFATE HFA 108 (90 BASE) MCG/ACT IN AERS: 2.0000 | INHALATION_SPRAY | RESPIRATORY_TRACT | Status: DC | PRN

## 2015-02-20 MED ORDER — BECLOMETHASONE DIPROPIONATE 80 MCG/ACT IN AERS: 1.0000 | INHALATION_SPRAY | Freq: Two times a day (BID) | RESPIRATORY_TRACT | Status: DC

## 2015-02-20 MED ORDER — MOMETASONE FUROATE 50 MCG/ACT NA SUSP: 2.0000 | Freq: Every day | NASAL | Status: DC

## 2015-02-20 NOTE — Patient Instructions (Signed)
Continue on his current medications Follow-up in 6 months

## 2015-02-20 NOTE — Progress Notes (Signed)
  [place] [address] Dept: [phone]  FOLLOW UP NOTE  Patient ID: George Alvarez, male    DOB: 1945/05/30  Age: 70 y.o. MRN: [id number] Date of Office Visit: 02/20/2015  Assessment Chief Complaint: Follow-up  HPI Keen Boso [facility] presents for follow-up of his asthma. His asthma has been well controlled over the past 6 months except for a slight cough over the past 2 days. He is not having significant nasal congestion.  Current medications are Qvar 80-1 puff twice a day, Ventolin 2 puffs every 4 hours if needed, singular 10 mg once a day, Nasonex 2 sprays per nostril once a day if needed . His other medications are outlined in the chart   Drug Allergies:  Allergies  Allergen Reactions  . Aspirin     Angioedema in 1980s Medi Alert bracelet  is recommended  . Nifedipine Rash    edema    Physical Exam: BP 110/80 mmHg  Pulse 60  Temp(Src) 98.2 F (36.8 C) (Oral)  Resp 16  Ht 5' 9.8" (1.773 m)  Wt 220 lb 0.3 oz (99.8 kg)  BMI 31.75 kg/m2   Physical Exam  Constitutional: He is oriented to person, place, and time. He appears well-developed and well-nourished.  HENT:  Eyes normal. Ears normal. Nose normal. Pharynx normal.  Neck: Neck supple.  Cardiovascular:  S1 and S2 normal no murmurs  Pulmonary/Chest:  Clear to percussion and auscultation  Lymphadenopathy:    He has no cervical adenopathy.  Neurological: He is alert and oriented to person, place, and time.  Vitals reviewed.   Diagnostics:   FVC 3.18 L FEV1 2.56 L. Predicted FVC 4.58 L predicted FEV1 3.41 L-this shows a mild reduction in the forced vital capacity but stable for him  Assessment and Plan: 1. Moderate persistent asthma, uncomplicated   2. Other allergic rhinitis     Meds ordered this encounter  Medications  . beclomethasone (QVAR) 80 MCG/ACT inhaler    Sig: Inhale 1 puff into the lungs 2 (two) times daily.    Dispense:  1 Inhaler    Refill:  5  . albuterol (PROVENTIL  HFA;VENTOLIN HFA) 108 (90 BASE) MCG/ACT inhaler    Sig: Inhale 2 puffs into the lungs as needed. For wheezing    Dispense:  1 Inhaler    Refill:  1  . montelukast (SINGULAIR) 10 MG tablet    Sig: Take 1 tablet (10 mg total) by mouth at bedtime.    Dispense:  30 tablet    Refill:  5  . mometasone (NASONEX) 50 MCG/ACT nasal spray    Sig: Place 2 sprays into the nose daily.    Dispense:  17 g    Refill:  5    Patient Instructions  Continue on his current medications Follow-up in 6 months    Return in about 6 months (around 08/20/2015).    Thank you for the opportunity to care for this patient.  Please do not hesitate to contact me with questions.  Penne Lash, M.D.  [facility] [address] [place], [place] [address] [phone]

## 2015-04-08 HISTORY — PX: EPIDURAL BLOCK INJECTION: SHX1516

## 2015-04-10 ENCOUNTER — Telehealth: Payer: Self-pay | Admitting: *Deleted

## 2015-04-10 NOTE — Telephone Encounter (Signed)
Received fax from [facility] last week.  I spoke with their office to clarify type of procedure and date scheduled.  Procedure is epidural injection and is scheduled for 04/11/15.  Pt would need to hold Effient 7 days prior to procedure.  By the time clearance was received there were not 7 days prior to procedure.  I placed call to pt to discuss last Friday.  He returned call this AM and I spoke with him today.  He reports injection is scheduled for tomorrow. He reports he stopped Effient after dose on 04/03/15.  I told pt per Dr. Angelena Form it is OK to hold Effient for 7 days prior to injection and we would fax paperwork to Dr. Lauris Poag office with this noted.

## 2015-04-10 NOTE — Telephone Encounter (Signed)
Completed paperwork faxed to [facility]. I left message on voicemail for Dr. Lauris Poag office that paperwork was faxed this AM and to contact us if questions.

## 2015-04-11 DIAGNOSIS — I10 Essential (primary) hypertension: Secondary | ICD-10-CM | POA: Diagnosis not present

## 2015-04-11 DIAGNOSIS — M4806 Spinal stenosis, lumbar region: Secondary | ICD-10-CM | POA: Diagnosis not present

## 2015-04-11 DIAGNOSIS — Z6831 Body mass index (BMI) 31.0-31.9, adult: Secondary | ICD-10-CM | POA: Diagnosis not present

## 2015-04-24 ENCOUNTER — Encounter: Payer: Self-pay | Admitting: Internal Medicine

## 2015-04-24 ENCOUNTER — Ambulatory Visit (INDEPENDENT_AMBULATORY_CARE_PROVIDER_SITE_OTHER): Payer: PPO | Admitting: Internal Medicine

## 2015-04-24 VITALS — BP 112/70 | HR 59 | Temp 97.9°F | Ht 70.0 in | Wt 220.0 lb

## 2015-04-24 DIAGNOSIS — I2510 Atherosclerotic heart disease of native coronary artery without angina pectoris: Secondary | ICD-10-CM | POA: Diagnosis not present

## 2015-04-24 DIAGNOSIS — E785 Hyperlipidemia, unspecified: Secondary | ICD-10-CM

## 2015-04-24 DIAGNOSIS — Z0000 Encounter for general adult medical examination without abnormal findings: Secondary | ICD-10-CM | POA: Diagnosis not present

## 2015-04-24 DIAGNOSIS — I10 Essential (primary) hypertension: Secondary | ICD-10-CM | POA: Diagnosis not present

## 2015-04-24 NOTE — Progress Notes (Signed)
Pre visit review using our clinic review tool, if applicable. No additional management support is needed unless otherwise documented below in the visit note. 

## 2015-04-24 NOTE — Patient Instructions (Signed)
BEFORE YOU LEAVE THE OFFICE:  GO TO THE LAB  Get the blood work    GO TO THE FRONT DESK Schedule a routine office visit or check up to be done in  6 months  No fasting    Also consider see one of our nurses for a Medicare wellness exam

## 2015-04-24 NOTE — Assessment & Plan Note (Signed)
Td 2010, 06-2013 per pt ;Pneumonia shot 2013;Prevnar 2015; zostavax 2013. Had a flu shot  PSA, DRE: Sees Dr. Jeffie Pollock routinely  Colonoscopy in 2010, 08-2014, next 5 years per GI letter  o GI.  Diet and exercise discussed

## 2015-04-24 NOTE — Progress Notes (Signed)
Subjective:    Patient ID: George Alvarez, male    DOB: March 28, 1946, 70 y.o.   MRN: [id number]  DOS:  04/24/2015 Type of visit - description : CPX Interval history: HTN: Good compliance of medication, ambulatory BPs 120-130/70. Back pain: Recently had 2 epidurals, better. Hyperlipidemia: Good compliance , on Crestor CAD: Last visit with cardiology 12-2014, found to be stable.   Review of Systems Constitutional: No fever. No chills. No unexplained wt changes. No unusual sweats  HEENT: No dental problems, no ear discharge, no facial swelling, no voice changes. No eye discharge, no eye  redness , no  intolerance to light   Respiratory: No wheezing , no  difficulty breathing. No cough , no mucus production  Cardiovascular: No CP, no leg swelling , no  Palpitations  GI: no nausea, no vomiting, no diarrhea , no  abdominal pain.  No blood in the stools. No dysphagia, no odynophagia    Endocrine: No polyphagia, no polyuria , no polydipsia  GU: No dysuria, gross hematuria, difficulty urinating. No urinary urgency, no frequency.  Musculoskeletal: No joint swellings or unusual aches or pains  Skin: No change in the color of the skin, palor , no  Rash  Allergic, immunologic: No environmental allergies , no  food allergies  Neurological: History of benign positional vertigo, have symptoms from time to time, they are brief and usually associated with head turning or standing up quickly. no  syncope. No headaches. No diplopia, no slurred, no slurred speech, no motor deficits, no facial  Numbness  Hematological: No enlarged lymph nodes, no easy bruising , no unusual bleedings  Psychiatry: No suicidal ideas, no hallucinations, no beavior problems, no confusion.  No unusual/severe anxiety, no depression   Past Medical History  Diagnosis Date  . Hyperlipidemia   . GERD (gastroesophageal reflux disease)   . Perennial allergic rhinitis   . Asthma     post infectious; envirronmental  triggers  . Coronary artery disease     a. 02/2012 Cath/PCI: LM 10, LAD min irregs, LCX large, OM1 sm, 40 ost, OM2 95p (5.0x16 Veriflex & 4.5x12 Veriflex BMS'), RCA 30p, 6m, 30d, PDA/PLA min irregs, EF 65%  . Hypertension   . Skin cancer     Basal and squamous cell cancers, greater than 20  . BPH (benign prostatic hyperplasia)     With urinary obstruction  . Asthma, chronic 06/16/2007    Qualifier: Diagnosis of  By: Linna Darner MD, Gwyndolyn Saxon   Onset:as child Triggers (environmental, infectious, allergic): all, mainly environmental triggers Rescue inhaler [id number] Maintenance medications/ response:Singulair,Qvar Smoking history:never Family history pulmonary disease: no    . Benign paroxysmal positional vertigo 11/19/2012    Diagnosed at [facility], [place]  Physical therapy appointment pending   . Anal fissure   . Diverticulosis   . Fundic gland polyps of stomach, benign 2010  . Tubular adenoma of colon 2010  . Stroke ([place]) 2016  . Erectile dysfunction due to arterial insufficiency   . History of elevated PSA   . Nocturia   . Peyronie's disease   . Basal cell carcinoma of chest wall 2016    1.8 cm, treated with electrodessication and currettage    Past Surgical History  Procedure Laterality Date  . Septoplasty    . Hernia repair    . Vasectomy    . Tonsillectomy and adenoidectomy    . Trigger finger release    . Epidural steroids  2007, 2009    X 2 @ cervical &,  lumbar)  . Colonoscopy  2003, 2011    negative  . Upper gastrointestinal endoscopy  2011    gastric polyps  . Coronary angioplasty with stent placement  02/06/2012    OM2  bare metal   . Left heart catheterization with coronary angiogram N/A 02/06/2012    Procedure: LEFT HEART CATHETERIZATION WITH CORONARY ANGIOGRAM;  Surgeon: [name], MD;  Location: [facility] C[facility] LAB;  Service: Cardiovascular;  Laterality: N/A;  . Percutaneous coronary stent intervention (pci-s)  02/06/2012    Procedure: PERCUTANEOUS  CORONARY STENT INTERVENTION (PCI-S);  Surgeon: [name], MD;  Location: [facility] CATH LAB;  Service: Cardiovascular;;  . Intravascular ultrasound  02/06/2012    Procedure: INTRAVASCULAR ULTRASOUND;  Surgeon: [name], MD;  Location: [facility] CATH LAB;  Service: Cardiovascular;;  . Epidural block injection  04-2015    Dr Brien Few   Family History  Problem Relation Age of Onset  . Heart attack Father 69    died @ 83  . Heart disease Mother   . Breast cancer Mother   . Esophageal cancer Mother     Barrett's  . Heart attack Maternal Grandmother     after 65  . Dementia Maternal Grandmother     CVA  . Breast cancer Maternal Grandmother   . Diabetes Maternal Grandmother   . Stroke Maternal Grandmother     in 55s  . Heart attack Paternal Uncle 51  . Breast cancer Maternal Aunt   . Colon cancer Neg Hx   . Prostate cancer Neg Hx   . Colon polyps Neg Hx     Social History   Social History  . Marital Status: Married    Spouse Name: N/A  . Number of Children: 3  . Years of Education: N/A   Occupational History  . 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 Smoking status: Never Smoker   . Smokeless tobacco: Never Used  . Alcohol Use: 0.0 oz/week    0 Standard drinks or equivalent per week     Comment: Wine occasionally  . Drug Use: No  . Sexual Activity:    Partners: Female   Other Topics Concern  . Not on file   Social History Narrative   3 daughters, 60 g-children   lifes w/ wife in [place]        Medication List       This list is accurate as of: 04/24/15 11:59 PM.  Always use your most recent med list.               albuterol 108 (90 Base) MCG/ACT inhaler  Commonly known as:  PROVENTIL HFA;VENTOLIN HFA  Inhale 2 puffs into the lungs as needed. For wheezing     beclomethasone 80 MCG/ACT inhaler  Commonly known as:  QVAR  Inhale 1 puff into the lungs 2 (two) times daily.     [employer] ALLERGY PO  Take 1 tablet by  mouth daily.     esomeprazole 40 MG capsule  Commonly known as:  NEXIUM  Take 40 mg by mouth daily at 12 noon. OVER THE COUNTER     fluorouracil 5 % cream  Commonly known as:  EFUDEX  Apply 1 application topically daily. Reported on 04/24/2015     losartan-hydrochlorothiazide 50-12.5 MG tablet  Commonly known as:  HYZAAR  Take 1 tablet by mouth daily.     metoprolol tartrate 25 MG tablet  Commonly known as:  LOPRESSOR  Take  0.5 tablets (12.5 mg total) by mouth 2 (two) times daily.     mometasone 50 MCG/ACT nasal spray  Commonly known as:  NASONEX  Place 2 sprays into the nose daily.     montelukast 10 MG tablet  Commonly known as:  SINGULAIR  Take 1 tablet (10 mg total) by mouth at bedtime.     nitroGLYCERIN 0.4 MG SL tablet  Commonly known as:  NITROSTAT  Place 1 tablet (0.4 mg total) under the tongue every 5 (five) minutes as needed for chest pain.     prasugrel 10 MG Tabs tablet  Commonly known as:  EFFIENT  Take 1 tablet (10 mg total) by mouth daily.     rosuvastatin 40 MG tablet  Commonly known as:  CRESTOR  Take 1 tablet (40 mg total) by mouth daily.     tadalafil 5 MG tablet  Commonly known as:  CIALIS  Take 5 mg by mouth daily as needed for erectile dysfunction.           Objective:   Physical Exam BP 112/70 mmHg  Pulse 59  Temp(Src) 97.9 F (36.6 C) (Oral)  Ht 5\' 10"  (1.778 m)  Wt 220 lb (99.791 kg)  BMI 31.57 kg/m2  SpO2 98% General:   Well developed, well nourished . NAD.  Neck:  No thyromegaly HEENT:  Normocephalic . Face symmetric, atraumatic Lungs:  CTA B Normal respiratory effort, no intercostal retractions, no accessory muscle use. Heart: RRR,  no murmur.  No pretibial edema bilaterally  Abdomen:  Not distended, soft, non-tender. No rebound or rigidity.   Skin: Exposed areas without rash. Not pale. Not jaundice Neurologic:  alert & oriented X3.  Speech normal, gait appropriate for age and unassisted Strength symmetric and  appropriate for age.  Psych: Cognition and judgment appear intact.  Cooperative with normal attention span and concentration.  Behavior appropriate. No anxious or depressed appearing.    Assessment & Plan:   Assessment> Prediabetes HTN Hyperlipidemia CV: --CAD --Stroke, seen in a MRI Asthma MSK --DJD knees saw Dr Len Childs 2016, local injections --Spinal stenosis (neck-back) : dx ~ 2008 via MRI neck, back, had local injections then GERD Benign positional vertigo-- did PT before Elevated PSA -- Dr Jeffie Pollock Peyronie's  Disease Skin cancer : [facility] Dr Susie Cassette   Plan: HTN: Well-controlled, last BMP satisfactory, no change. Check a CBC Hyperlipidemia: On Crestor, check FLP CAD:  stable, continue with present care GERD: Currently asymptomatic. Continue PPIs RTC 6 months

## 2015-04-25 LAB — CBC WITH DIFFERENTIAL/PLATELET
BASOS ABS: 0 10*3/uL (ref 0.0–0.1)
Basophils Relative: 0.5 % (ref 0.0–3.0)
EOS ABS: 0.1 10*3/uL (ref 0.0–0.7)
Eosinophils Relative: 1.5 % (ref 0.0–5.0)
HCT: 43.9 % (ref 39.0–52.0)
Hemoglobin: 14.7 g/dL (ref 13.0–17.0)
LYMPHS ABS: 2.1 10*3/uL (ref 0.7–4.0)
Lymphocytes Relative: 21.9 % (ref 12.0–46.0)
MCHC: 33.4 g/dL (ref 30.0–36.0)
MCV: 90.4 fl (ref 78.0–100.0)
MONO ABS: 0.6 10*3/uL (ref 0.1–1.0)
Monocytes Relative: 6 % (ref 3.0–12.0)
NEUTROS ABS: 6.7 10*3/uL (ref 1.4–7.7)
NEUTROS PCT: 70.1 % (ref 43.0–77.0)
PLATELETS: 140 10*3/uL — AB (ref 150.0–400.0)
RBC: 4.85 Mil/uL (ref 4.22–5.81)
RDW: 13.1 % (ref 11.5–15.5)
WBC: 9.6 10*3/uL (ref 4.0–10.5)

## 2015-04-25 LAB — AST: AST: 22 U/L (ref 0–37)

## 2015-04-25 LAB — LIPID PANEL
CHOL/HDL RATIO: 3
Cholesterol: 116 mg/dL (ref 0–200)
HDL: 36.7 mg/dL — AB (ref >39.00)
LDL CALC: 50 mg/dL (ref 0–99)
NONHDL: 79.25
TRIGLYCERIDES: 148 mg/dL (ref 0.0–149.0)
VLDL: 29.6 mg/dL (ref 0.0–40.0)

## 2015-04-25 LAB — ALT: ALT: 20 U/L (ref 0–53)

## 2015-05-02 DIAGNOSIS — M4806 Spinal stenosis, lumbar region: Secondary | ICD-10-CM | POA: Diagnosis not present

## 2015-05-02 DIAGNOSIS — Z6831 Body mass index (BMI) 31.0-31.9, adult: Secondary | ICD-10-CM | POA: Diagnosis not present

## 2015-05-16 ENCOUNTER — Encounter: Payer: Commercial Managed Care - HMO | Admitting: Internal Medicine

## 2015-06-07 ENCOUNTER — Other Ambulatory Visit: Payer: Self-pay | Admitting: Neurology

## 2015-06-07 MED ORDER — MONTELUKAST SODIUM 10 MG PO TABS: 10.0000 mg | ORAL_TABLET | Freq: Every day | ORAL | Status: DC

## 2015-08-09 ENCOUNTER — Ambulatory Visit (INDEPENDENT_AMBULATORY_CARE_PROVIDER_SITE_OTHER): Payer: PPO | Admitting: Pediatrics

## 2015-08-09 ENCOUNTER — Encounter: Payer: Self-pay | Admitting: Pediatrics

## 2015-08-09 VITALS — BP 124/72 | HR 57 | Temp 98.1°F | Resp 16

## 2015-08-09 DIAGNOSIS — Z79899 Other long term (current) drug therapy: Secondary | ICD-10-CM | POA: Diagnosis not present

## 2015-08-09 DIAGNOSIS — J3089 Other allergic rhinitis: Secondary | ICD-10-CM | POA: Diagnosis not present

## 2015-08-09 DIAGNOSIS — J4540 Moderate persistent asthma, uncomplicated: Secondary | ICD-10-CM

## 2015-08-09 DIAGNOSIS — K219 Gastro-esophageal reflux disease without esophagitis: Secondary | ICD-10-CM

## 2015-08-09 DIAGNOSIS — I10 Essential (primary) hypertension: Secondary | ICD-10-CM

## 2015-08-09 NOTE — Patient Instructions (Addendum)
Continue on his current medications Call me if he is not doing well on this treatment plan  

## 2015-08-09 NOTE — Progress Notes (Signed)
  [place] [address] Dept: [phone]  FOLLOW UP NOTE  Patient ID: George Alvarez, male    DOB: 06-30-1945  Age: 70 y.o. MRN: [id number] Date of Office Visit: 08/09/2015  Assessment Chief Complaint: Follow-up  HPI Cordelle Choudhary [facility] presents for follow-up of asthma and allergic rhinitis. His symptoms are well controlled.  Current medications are Qvar 80-one puff twice a day, Ventolin 2 puffs every 4 hours if needed, montelukast 10 mg once a day, Nasonex 1 spray per nostril twice a day if needed,  omeprazole 40 mg once a day, metoprolol 25 mg once a day. His other medications are outlined in the chart.   Drug Allergies:  Allergies  Allergen Reactions  . Aspirin     Angioedema in 1980s Medi Alert bracelet  is recommended  . Nifedipine Rash    edema    Physical Exam: BP 124/72 mmHg  Pulse 57  Temp(Src) 98.1 F (36.7 C) (Oral)  Resp 16   Physical Exam  Constitutional: He is oriented to person, place, and time. He appears well-developed and well-nourished.  HENT:  Eyes normal. Ears normal. Nose mild swelling of nasal turbinates. Pharynx normal.  Neck: Neck supple.  Cardiovascular:  S1 and S2 normal no murmurs  Pulmonary/Chest:  Chest clear to percussion auscultation  Lymphadenopathy:    He has no cervical adenopathy.  Neurological: He is alert and oriented to person, place, and time.  Psychiatric: He has a normal mood and affect. His behavior is normal. Judgment and thought content normal.  Vitals reviewed.   Diagnostics:  FVC 3.37 L FEV1 2.37 L. Predicted FVC or 0.55 L predicted FEV1 3.38 L-this shows a mild reduction in the FVC  Assessment and Plan: 1. Moderate persistent asthma, uncomplicated   2. Other allergic rhinitis   3. Essential hypertension   4. Current use of beta blocker   5. Gastroesophageal reflux disease without esophagitis         Patient Instructions  Continue on his current medications Call me if he is not doing  well on this treatment plan     Return in about 6 months (around 02/09/2016).    Thank you for the opportunity to care for this patient.  Please do not hesitate to contact me with questions.  Penne Lash, M.D.  [facility] [address] [place], [place] [address] [phone]

## 2015-08-21 ENCOUNTER — Ambulatory Visit: Payer: Commercial Managed Care - HMO | Admitting: Pediatrics

## 2015-09-10 ENCOUNTER — Encounter: Payer: Self-pay | Admitting: Internal Medicine

## 2015-09-10 ENCOUNTER — Ambulatory Visit (INDEPENDENT_AMBULATORY_CARE_PROVIDER_SITE_OTHER): Payer: PPO | Admitting: Internal Medicine

## 2015-09-10 VITALS — BP 126/74 | HR 69 | Temp 98.7°F | Ht 70.0 in | Wt 223.4 lb

## 2015-09-10 DIAGNOSIS — M5417 Radiculopathy, lumbosacral region: Secondary | ICD-10-CM | POA: Diagnosis not present

## 2015-09-10 MED ORDER — PREDNISONE 10 MG PO TABS: ORAL_TABLET | ORAL | Status: DC

## 2015-09-10 NOTE — Progress Notes (Signed)
Pre visit review using our clinic review tool, if applicable. No additional management support is needed unless otherwise documented below in the visit note. 

## 2015-09-10 NOTE — Progress Notes (Signed)
Subjective:    Patient ID: George Alvarez, male    DOB: 31-Oct-1945, 70 y.o.   MRN: [id number]  DOS:  09/10/2015 Type of visit - description : Acute visit, back pain Interval history: Patient developed back pain 3 days ago, symptoms are completely different from his spinal stenosis pain. Pain is located at the left back, radiates down to the left foot, the leg is consistently tingly and sometimes numb. No injury or fall, in the last few days, did some swimming and a very light work at [employer].  Pain is a slightly worse with walking.   Review of Systems  No fever chills No chest pain or difficulty breathing No abdominal pain, dysuria or gross hematuria No rash anywhere in the back No bladder or bowel incontinence  Past Medical History  Diagnosis Date  . Hyperlipidemia   . GERD (gastroesophageal reflux disease)   . Perennial allergic rhinitis   . Asthma     post infectious; envirronmental triggers  . Coronary artery disease     a. 02/2012 Cath/PCI: LM 10, LAD min irregs, LCX large, OM1 sm, 40 ost, OM2 95p (5.0x16 Veriflex & 4.5x12 Veriflex BMS'), RCA 30p, 41m, 30d, PDA/PLA min irregs, EF 65%  . Hypertension   . Skin cancer     Basal and squamous cell cancers, greater than 20  . BPH (benign prostatic hyperplasia)     With urinary obstruction  . Asthma, chronic 06/16/2007    Qualifier: Diagnosis of  By: Linna Darner MD, Gwyndolyn Saxon   Onset:as child Triggers (environmental, infectious, allergic): all, mainly environmental triggers Rescue inhaler [id number] Maintenance medications/ response:Singulair,Qvar Smoking history:never Family history pulmonary disease: no    . Benign paroxysmal positional vertigo 11/19/2012    Diagnosed at [facility], [place]  Physical therapy appointment pending   . Anal fissure   . Diverticulosis   . Fundic gland polyps of stomach, benign 2010  . Tubular adenoma of colon 2010  . Stroke ([place]) 2016  . Erectile dysfunction due to arterial insufficiency   .  History of elevated PSA   . Nocturia   . Peyronie's disease   . Basal cell carcinoma of chest wall 2016    1.8 cm, treated with electrodessication and currettage    Past Surgical History  Procedure Laterality Date  . Septoplasty    . Hernia repair    . Vasectomy    . Tonsillectomy and adenoidectomy    . Trigger finger release    . Epidural steroids  2007, 2009    X 2 @ cervical &, lumbar)  . Colonoscopy  2003, 2011    negative  . Upper gastrointestinal endoscopy  2011    gastric polyps  . Coronary angioplasty with stent placement  02/06/2012    OM2  bare metal   . Left heart catheterization with coronary angiogram N/A 02/06/2012    Procedure: LEFT HEART CATHETERIZATION WITH CORONARY ANGIOGRAM;  Surgeon: [name], MD;  Location: [facility] CATH LAB;  Service: Cardiovascular;  Laterality: N/A;  . Percutaneous coronary stent intervention (pci-s)  02/06/2012    Procedure: PERCUTANEOUS CORONARY STENT INTERVENTION (PCI-S);  Surgeon: [name], MD;  Location: [facility] CATH LAB;  Service: Cardiovascular;;  . Intravascular ultrasound  02/06/2012    Procedure: INTRAVASCULAR ULTRASOUND;  Surgeon: [name], MD;  Location: [facility] CATH LAB;  Service: Cardiovascular;;  . Epidural block injection  04-2015    Dr Brien Few    Social History   Social History  . Marital Status:  Married    Spouse Name: N/A  . Number of Children: 3  . Years of Education: N/A   Occupational History  . 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 Smoking status: Never Smoker   . Smokeless tobacco: Never Used  . Alcohol Use: 0.0 oz/week    0 Standard drinks or equivalent per week     Comment: Wine occasionally  . Drug Use: No  . Sexual Activity:    Partners: Female   Other Topics Concern  . Not on file   Social History Narrative   3 daughters, 94 g-children   lifes w/ wife in [place]        Medication List       This list is accurate as of: 09/10/15  11:59 PM.  Always use your most recent med list.               albuterol 108 (90 Base) MCG/ACT inhaler  Commonly known as:  PROVENTIL HFA;VENTOLIN HFA  Inhale 2 puffs into the lungs as needed. For wheezing     beclomethasone 80 MCG/ACT inhaler  Commonly known as:  QVAR  Inhale 1 puff into the lungs 2 (two) times daily.     [employer] ALLERGY PO  Take 1 tablet by mouth daily.     esomeprazole 40 MG capsule  Commonly known as:  NEXIUM  Take 40 mg by mouth daily at 12 noon. OVER THE COUNTER     fluorouracil 5 % cream  Commonly known as:  EFUDEX  Apply 1 application topically daily. Reported on 09/10/2015     losartan-hydrochlorothiazide 50-12.5 MG tablet  Commonly known as:  HYZAAR  Take 1 tablet by mouth daily.     metoprolol tartrate 25 MG tablet  Commonly known as:  LOPRESSOR  Take 0.5 tablets (12.5 mg total) by mouth 2 (two) times daily.     mometasone 50 MCG/ACT nasal spray  Commonly known as:  NASONEX  Place 2 sprays into the nose daily.     montelukast 10 MG tablet  Commonly known as:  SINGULAIR  Take 1 tablet (10 mg total) by mouth at bedtime.     nitroGLYCERIN 0.4 MG SL tablet  Commonly known as:  NITROSTAT  Place 1 tablet (0.4 mg total) under the tongue every 5 (five) minutes as needed for chest pain.     prasugrel 10 MG Tabs tablet  Commonly known as:  EFFIENT  Take 1 tablet (10 mg total) by mouth daily.     predniSONE 10 MG tablet  Commonly known as:  DELTASONE  4 tablets x 2 days, 3 tabs x 2 days, 2 tabs x 2 days, 1 tab x 2 days     rosuvastatin 40 MG tablet  Commonly known as:  CRESTOR  Take 1 tablet (40 mg total) by mouth daily.     tadalafil 5 MG tablet  Commonly known as:  CIALIS  Take 5 mg by mouth daily as needed for erectile dysfunction.           Objective:   Physical Exam BP 126/74 mmHg  Pulse 69  Temp(Src) 98.7 F (37.1 C) (Oral)  Ht 5\' 10"  (1.778 m)  Wt 223 lb 6 oz (101.322 kg)  BMI 32.05 kg/m2  SpO2 97% General:   Well  developed, well nourished . NAD.  HEENT:  Normocephalic . Face symmetric, atraumatic MSK: Slightly TTP left from T12-L1. No rash in that area. Skin: Not pale. Not jaundice  Neurologic:  alert & oriented X3.  Speech normal, gait appropriate for age and unassisted DTRs symmetric, straight leg test negative, pinprick examination normal Psych--  Cognition and judgment appear intact.  Cooperative with normal attention span and concentration.  Behavior appropriate. No anxious or depressed appearing.      Assessment & Plan:  Assessment> Prediabetes HTN Hyperlipidemia CV: --CAD --Stroke, seen in a MRI Asthma MSK --DJD knees saw Dr Len Childs 2016, local injections --Spinal stenosis (neck-back) : dx ~ 2008 via MRI neck, back, had local injections 2008; then 04-2015 GERD Benign positional vertigo-- did PT before Elevated PSA -- Dr Jeffie Pollock Peyronie's  Disease Skin cancer : [facility] Dr Susie Cassette   PLAN: Radiculopathy: Symptoms consistent with radiculopathy, neuro exam normal. He has chronic back pain due to spinal stenosis and is scheduled to have a second local injection in a couple of weeks. Plan: Prednisone for a few days, gentle stretching, heating pad. I advised him to contact his orthopedic doctor let him know his new sx to see is appropriate to proceed with a local injection vs see him and maybe do some additional x-rays. Patient verbalized understanding F/u next month. CPX.

## 2015-09-10 NOTE — Patient Instructions (Signed)
Prednisone as prescribed  Tylenol 500 mg 2 tablets 3 times a day as needed  Gentle stretching  Warm compress  Call your orthopedic doctor  Call anytime if he has severe symptoms, fever, chills or a rash

## 2015-09-11 NOTE — Assessment & Plan Note (Signed)
Radiculopathy: Symptoms consistent with radiculopathy, neuro exam normal. He has chronic back pain due to spinal stenosis and is scheduled to have a second local injection in a couple of weeks. Plan: Prednisone for a few days, gentle stretching, heating pad. I advised him to contact his orthopedic doctor let him know his new sx to see is appropriate to proceed with a local injection vs see him and maybe do some additional x-rays. Patient verbalized understanding F/u next month. CPX.

## 2015-09-19 DIAGNOSIS — Z08 Encounter for follow-up examination after completed treatment for malignant neoplasm: Secondary | ICD-10-CM | POA: Diagnosis not present

## 2015-09-19 DIAGNOSIS — L570 Actinic keratosis: Secondary | ICD-10-CM | POA: Diagnosis not present

## 2015-09-19 DIAGNOSIS — Z85828 Personal history of other malignant neoplasm of skin: Secondary | ICD-10-CM | POA: Diagnosis not present

## 2015-09-24 DIAGNOSIS — M5416 Radiculopathy, lumbar region: Secondary | ICD-10-CM | POA: Diagnosis not present

## 2015-09-24 DIAGNOSIS — M4806 Spinal stenosis, lumbar region: Secondary | ICD-10-CM | POA: Diagnosis not present

## 2015-09-24 DIAGNOSIS — Z6832 Body mass index (BMI) 32.0-32.9, adult: Secondary | ICD-10-CM | POA: Diagnosis not present

## 2015-10-04 ENCOUNTER — Other Ambulatory Visit (HOSPITAL_BASED_OUTPATIENT_CLINIC_OR_DEPARTMENT_OTHER): Payer: Self-pay | Admitting: Neurosurgery

## 2015-10-04 DIAGNOSIS — M48061 Spinal stenosis, lumbar region without neurogenic claudication: Secondary | ICD-10-CM

## 2015-10-04 DIAGNOSIS — M4722 Other spondylosis with radiculopathy, cervical region: Secondary | ICD-10-CM

## 2015-10-06 ENCOUNTER — Ambulatory Visit (HOSPITAL_BASED_OUTPATIENT_CLINIC_OR_DEPARTMENT_OTHER)
Admission: RE | Admit: 2015-10-06 | Discharge: 2015-10-06 | Disposition: A | Discharge status: Home / Self Care | Payer: PPO | Source: Ambulatory Visit | Attending: Neurosurgery | Admitting: Neurosurgery

## 2015-10-06 DIAGNOSIS — M4806 Spinal stenosis, lumbar region: Secondary | ICD-10-CM | POA: Insufficient documentation

## 2015-10-06 DIAGNOSIS — M50221 Other cervical disc displacement at C4-C5 level: Secondary | ICD-10-CM | POA: Diagnosis not present

## 2015-10-06 DIAGNOSIS — M4722 Other spondylosis with radiculopathy, cervical region: Secondary | ICD-10-CM | POA: Diagnosis not present

## 2015-10-06 DIAGNOSIS — M1288 Other specific arthropathies, not elsewhere classified, other specified site: Secondary | ICD-10-CM | POA: Diagnosis not present

## 2015-10-06 DIAGNOSIS — M5126 Other intervertebral disc displacement, lumbar region: Secondary | ICD-10-CM | POA: Diagnosis not present

## 2015-10-06 DIAGNOSIS — M2578 Osteophyte, vertebrae: Secondary | ICD-10-CM | POA: Insufficient documentation

## 2015-10-06 DIAGNOSIS — M4802 Spinal stenosis, cervical region: Secondary | ICD-10-CM | POA: Diagnosis not present

## 2015-10-06 DIAGNOSIS — M48061 Spinal stenosis, lumbar region without neurogenic claudication: Secondary | ICD-10-CM

## 2015-10-15 DIAGNOSIS — M4722 Other spondylosis with radiculopathy, cervical region: Secondary | ICD-10-CM | POA: Diagnosis not present

## 2015-10-15 DIAGNOSIS — M4312 Spondylolisthesis, cervical region: Secondary | ICD-10-CM | POA: Diagnosis not present

## 2015-10-15 DIAGNOSIS — Z6832 Body mass index (BMI) 32.0-32.9, adult: Secondary | ICD-10-CM | POA: Diagnosis not present

## 2015-10-17 ENCOUNTER — Other Ambulatory Visit: Payer: Self-pay | Admitting: Allergy

## 2015-10-17 ENCOUNTER — Other Ambulatory Visit: Payer: Self-pay | Admitting: Internal Medicine

## 2015-10-17 MED ORDER — MONTELUKAST SODIUM 10 MG PO TABS: 10.0000 mg | ORAL_TABLET | Freq: Every day | ORAL | Status: DC

## 2015-10-18 ENCOUNTER — Encounter: Payer: Self-pay | Admitting: Internal Medicine

## 2015-10-18 ENCOUNTER — Ambulatory Visit (INDEPENDENT_AMBULATORY_CARE_PROVIDER_SITE_OTHER): Payer: PPO | Admitting: Internal Medicine

## 2015-10-18 ENCOUNTER — Ambulatory Visit: Payer: PPO | Attending: Neurosurgery | Admitting: Physical Therapy

## 2015-10-18 VITALS — BP 126/78 | HR 58 | Temp 98.1°F | Ht 70.0 in | Wt 223.2 lb

## 2015-10-18 DIAGNOSIS — I10 Essential (primary) hypertension: Secondary | ICD-10-CM

## 2015-10-18 DIAGNOSIS — R739 Hyperglycemia, unspecified: Secondary | ICD-10-CM

## 2015-10-18 DIAGNOSIS — R269 Unspecified abnormalities of gait and mobility: Secondary | ICD-10-CM | POA: Diagnosis not present

## 2015-10-18 DIAGNOSIS — M542 Cervicalgia: Secondary | ICD-10-CM | POA: Insufficient documentation

## 2015-10-18 DIAGNOSIS — M4800 Spinal stenosis, site unspecified: Secondary | ICD-10-CM

## 2015-10-18 DIAGNOSIS — I2510 Atherosclerotic heart disease of native coronary artery without angina pectoris: Secondary | ICD-10-CM

## 2015-10-18 DIAGNOSIS — R6882 Decreased libido: Secondary | ICD-10-CM

## 2015-10-18 DIAGNOSIS — H8112 Benign paroxysmal vertigo, left ear: Secondary | ICD-10-CM | POA: Diagnosis not present

## 2015-10-18 NOTE — Progress Notes (Signed)
Pre visit review using our clinic review tool, if applicable. No additional management support is needed unless otherwise documented below in the visit note. 

## 2015-10-18 NOTE — Assessment & Plan Note (Signed)
Prediabetes: Check A1c HTN: Well-controlled, continue Hyzaar, check a BMP CAD: To see cardiology by September 2017. He is holding Effient due to possible spinal injection. Spinal stenosis: Recently saw Dr. Cyndy Freeze, MRIs were done, has severe neck stenosis, not a surgical candidate per pt, planning neck PT. For lumbar stenosis they are planning on local injection. Extensive conversation about the issue, listened to all his concerns. Excessive gas, frequent feeling of a bowel movement: He is up-to-date with colonoscopies, no red flag symptoms such as blood in the stools. We discussed possibly GI referral but at the end we elected to try probiotics. Lack of energy, ED, decreased libido: Likes testosterone checked. Will do. Aware that we will only need HRT if T is very low  RTC 04-2016 CPX

## 2015-10-18 NOTE — Progress Notes (Signed)
Subjective:    Patient ID: George Alvarez, male    DOB: [birth year], 70 y.o.   MRN: [id number]  DOS:  10/18/2015 Type of visit - description : Routine office visit Interval history: MSK: Continue with neck and back pain, we had extensive discussion about the issue. Complains of fatigue, decreased libido and some erectile dysfunction, chronic issues, would like his testosterone checked HTN: Good med compliance, ambulatory BPs normal Prediabetes: Due for A1c Reports issues with abundant gas/flatus; also multiple times a day feels like he has to have a bowel movement but nothing happens.  Review of Systems Denies chest pain or difficulty breathing No nausea, vomiting, diarrhea. No blood in the stools or abdominal pain.   Past Medical History  Diagnosis Date  . Hyperlipidemia   . GERD (gastroesophageal reflux disease)   . Perennial allergic rhinitis   . Asthma     post infectious; envirronmental triggers  . Coronary artery disease     a. 02/2012 Cath/PCI: LM 10, LAD min irregs, LCX large, OM1 sm, 40 ost, OM2 95p (5.0x16 Veriflex & 4.5x12 Veriflex BMS'), RCA 30p, 31m, 30d, PDA/PLA min irregs, EF 65%  . Hypertension   . Skin cancer     Basal and squamous cell cancers, greater than 20  . BPH (benign prostatic hyperplasia)     With urinary obstruction  . Asthma, chronic 06/16/2007    Qualifier: Diagnosis of  By: Linna Darner MD, Gwyndolyn Saxon   Onset:as child Triggers (environmental, infectious, allergic): all, mainly environmental triggers Rescue inhaler [id number] Maintenance medications/ response:Singulair,Qvar Smoking history:never Family history pulmonary disease: no    . Benign paroxysmal positional vertigo 11/19/2012    Diagnosed at [facility], [place]  Physical therapy appointment pending   . Anal fissure   . Diverticulosis   . Fundic gland polyps of stomach, benign 2010  . Tubular adenoma of colon 2010  . Stroke ([place]) 2016  . Erectile dysfunction due to arterial insufficiency     . History of elevated PSA   . Nocturia   . Peyronie's disease   . Basal cell carcinoma of chest wall 2016    1.8 cm, treated with electrodessication and currettage    Past Surgical History  Procedure Laterality Date  . Septoplasty    . Hernia repair    . Vasectomy    . Tonsillectomy and adenoidectomy    . Trigger finger release    . Epidural steroids  2007, 2009    X 2 @ cervical &, lumbar)  . Colonoscopy  2003, 2011    negative  . Upper gastrointestinal endoscopy  2011    gastric polyps  . Coronary angioplasty with stent placement  02/06/2012    OM2  bare metal   . Left heart catheterization with coronary angiogram N/A 02/06/2012    Procedure: LEFT HEART CATHETERIZATION WITH CORONARY ANGIOGRAM;  Surgeon: [name], MD;  Location: [facility] CATH LAB;  Service: Cardiovascular;  Laterality: N/A;  . Percutaneous coronary stent intervention (pci-s)  02/06/2012    Procedure: PERCUTANEOUS CORONARY STENT INTERVENTION (PCI-S);  Surgeon: [name], MD;  Location: [facility] CATH LAB;  Service: Cardiovascular;;  . Intravascular ultrasound  02/06/2012    Procedure: INTRAVASCULAR ULTRASOUND;  Surgeon: [name], MD;  Location: [facility] CATH LAB;  Service: Cardiovascular;;  . Epidural block injection  04-2015    Dr Brien Few    Social History   Social History  . Marital Status: Married    Spouse Name: N/A  . Number of Children:  3  . Years of Education: N/A   Occupational History  . 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 Smoking status: Never Smoker   . Smokeless tobacco: Never Used  . Alcohol Use: 0.0 oz/week    0 Standard drinks or equivalent per week     Comment: Wine occasionally  . Drug Use: No  . Sexual Activity:    Partners: Female   Other Topics Concern  . Not on file   Social History Narrative   3 daughters, 7 g-children   lifes w/ wife in [place]        Medication List       This list is accurate as of:  10/18/15 12:49 PM.  Always use your most recent med list.               albuterol 108 (90 Base) MCG/ACT inhaler  Commonly known as:  PROVENTIL HFA;VENTOLIN HFA  Inhale 2 puffs into the lungs as needed. For wheezing     beclomethasone 80 MCG/ACT inhaler  Commonly known as:  QVAR  Inhale 1 puff into the lungs 2 (two) times daily.     [employer] ALLERGY PO  Take 1 tablet by mouth daily.     esomeprazole 40 MG capsule  Commonly known as:  NEXIUM  Take 40 mg by mouth daily at 12 noon. OVER THE COUNTER     fluorouracil 5 % cream  Commonly known as:  EFUDEX  Apply 1 application topically daily. Reported on 09/10/2015     losartan-hydrochlorothiazide 50-12.5 MG tablet  Commonly known as:  HYZAAR  Take 1 tablet by mouth daily.     metoprolol tartrate 25 MG tablet  Commonly known as:  LOPRESSOR  Take 0.5 tablets (12.5 mg total) by mouth 2 (two) times daily.     mometasone 50 MCG/ACT nasal spray  Commonly known as:  NASONEX  Place 2 sprays into the nose daily.     montelukast 10 MG tablet  Commonly known as:  SINGULAIR  Take 1 tablet (10 mg total) by mouth at bedtime.     nitroGLYCERIN 0.4 MG SL tablet  Commonly known as:  NITROSTAT  Place 1 tablet (0.4 mg total) under the tongue every 5 (five) minutes as needed for chest pain.     prasugrel 10 MG Tabs tablet  Commonly known as:  EFFIENT  Take 1 tablet (10 mg total) by mouth daily.     rosuvastatin 40 MG tablet  Commonly known as:  CRESTOR  Take 1 tablet (40 mg total) by mouth daily.     tadalafil 5 MG tablet  Commonly known as:  CIALIS  Take 5 mg by mouth daily as needed for erectile dysfunction.           Objective:   Physical Exam BP 126/78 mmHg  Pulse 58  Temp(Src) 98.1 F (36.7 C) (Oral)  Ht 5\' 10"  (1.778 m)  Wt 223 lb 4 oz (101.266 kg)  BMI 32.03 kg/m2  SpO2 96% General:   Well developed, well nourished . NAD.  HEENT:  Normocephalic . Face symmetric, atraumatic Lungs:  CTA B Normal respiratory effort, no  intercostal retractions, no accessory muscle use. Heart: RRR,  no murmur.  no pretibial edema bilaterally  Abdomen:  Not distended, soft, non-tender. No rebound or rigidity.  DIABETIC FEET EXAM: No lower extremity edema Normal pedal pulses bilaterally Skin normal, nails diastrophic Pinprick examination of the feet normal. Neurologic:  alert & oriented X3.  Speech normal, gait appropriate for age and unassisted Psych--  Cognition and judgment appear intact.  Cooperative with normal attention span and concentration.  Behavior appropriate. No anxious or depressed appearing.    Assessment & Plan:   Assessment  Prediabetes HTN Hyperlipidemia CV: --CAD --Stroke, seen in a MRI Asthma- Dr Darcey Nora  MSK --DJD knees saw Dr Len Childs 2016, local injections --Spinal stenosis (neck-back) : Dr Hal Neer retired, now Dr Cyndy Freeze dx ~ 2008 via MRI neck, back, had local injections 2008; then 04-2015 GERD Benign positional vertigo-- did PT before GU: --Elevated PSA -- Dr Jeffie Pollock --Peyronie's  Disease Skin cancer : [facility] Dr Susie Cassette   PLAN: Prediabetes: Check A1c HTN: Well-controlled, continue Hyzaar, check a BMP CAD: To see cardiology by September 2017. He is holding Effient due to possible spinal injection. Spinal stenosis: Recently saw Dr. Cyndy Freeze, MRIs were done, has severe neck stenosis, not a surgical candidate per pt, planning neck PT. For lumbar stenosis they are planning on local injection. Extensive conversation about the issue, listened to all his concerns. Excessive gas, frequent feeling of a bowel movement: He is up-to-date with colonoscopies, no red flag symptoms such as blood in the stools. We discussed possibly GI referral but at the end we elected to try probiotics. Lack of energy, ED, decreased libido: Likes testosterone checked. Will do. Aware that we will only need HRT if T is very low  RTC 04-2016 CPX

## 2015-10-18 NOTE — Therapy (Signed)
[facility] [address]  [place], [place], [address] Phone: [phone]   Fax:  [phone]  Physical Therapy Evaluation  Patient Details  Name: George Alvarez MRN: [id number] Date of Birth: 1946-03-01 Referring Provider: Marylyn Ishihara L. Christella Noa, MD  Encounter Date: 10/18/2015      PT End of Session - 10/18/15 1315    Visit Number 1   Number of Visits 12   Date for PT Re-Evaluation 12/21/15  POC dates extended to allow for 12 visits due to pt planned vacation   PT Start Time 1315   PT Stop Time 1418   PT Time Calculation (min) 63 min   Activity Tolerance Patient tolerated treatment well   Behavior During Therapy [facility] for tasks assessed/performed      Past Medical History  Diagnosis Date  . Hyperlipidemia   . GERD (gastroesophageal reflux disease)   . Perennial allergic rhinitis   . Asthma     post infectious; envirronmental triggers  . Coronary artery disease     a. 02/2012 Cath/PCI: LM 10, LAD min irregs, LCX large, OM1 sm, 40 ost, OM2 95p (5.0x16 Veriflex & 4.5x12 Veriflex BMS'), RCA 30p, 68m, 30d, PDA/PLA min irregs, EF 65%  . Hypertension   . Skin cancer     Basal and squamous cell cancers, greater than 20  . BPH (benign prostatic hyperplasia)     With urinary obstruction  . Asthma, chronic 06/16/2007    Qualifier: Diagnosis of  By: Linna Darner MD, Gwyndolyn Saxon   Onset:as child Triggers (environmental, infectious, allergic): all, mainly environmental triggers Rescue inhaler [id number] Maintenance medications/ response:Singulair,Qvar Smoking history:never Family history pulmonary disease: no    . Benign paroxysmal positional vertigo 11/19/2012    Diagnosed at [facility], [place]  Physical therapy appointment pending   . Anal fissure   . Diverticulosis   . Fundic gland polyps of stomach, benign 2010  . Tubular adenoma of colon 2010  . Stroke ([place]) 2016  . Erectile dysfunction due to arterial insufficiency   .  History of elevated PSA   . Nocturia   . Peyronie's disease   . Basal cell carcinoma of chest wall 2016    1.8 cm, treated with electrodessication and currettage    Past Surgical History  Procedure Laterality Date  . Septoplasty    . Hernia repair    . Vasectomy    . Tonsillectomy and adenoidectomy    . Trigger finger release    . Epidural steroids  2007, 2009    X 2 @ cervical &, lumbar)  . Colonoscopy  2003, 2011    negative  . Upper gastrointestinal endoscopy  2011    gastric polyps  . Coronary angioplasty with stent placement  02/06/2012    OM2  bare metal   . Left heart catheterization with coronary angiogram N/A 02/06/2012    Procedure: LEFT HEART CATHETERIZATION WITH CORONARY ANGIOGRAM;  Surgeon: [name], MD;  Location: [facility] CATH LAB;  Service: Cardiovascular;  Laterality: N/A;  . Percutaneous coronary stent intervention (pci-s)  02/06/2012    Procedure: PERCUTANEOUS CORONARY STENT INTERVENTION (PCI-S);  Surgeon: [name], MD;  Location: [facility] CATH LAB;  Service: Cardiovascular;;  . Intravascular ultrasound  02/06/2012    Procedure: INTRAVASCULAR ULTRASOUND;  Surgeon: [name], MD;  Location: [facility] CATH LAB;  Service: Cardiovascular;;  . Epidural block injection  04-2015    Dr Brien Few    There were no vitals filed for this  visit.       Subjective Assessment - 10/18/15 1324    Subjective Pt reports first episode of neck pain 8-9 yrs ago which was alleviated by injections. Neck pain returned ~3 months (along with LBP) ago when he started trying to work out again, therefore he stopped working out other than water aerobics. Pt notes limited motion with pain/pressure at end of range, worst in flexion and L rotation.   Diagnostic tests 10/06/15 MRI Cervical spine: 1. At C4-5 there is moderate broad-based disc bulge. Moderate bilateral facet arthropathy and uncovertebral degenerative changes resulting in severe right and mild left foraminal stenosis.   2. At C5-6 there is a broad-based disc bulge with a central disc protrusion deforming the ventral cervical spinal cord. Mild central canal stenosis. Bilateral uncovertebral degenerative changes resulting in mild bilateral foraminal narrowing.  3. At C6-7 there is a broad-based disc bulge with a right paracentral disc protrusion. Bilateral uncovertebral degenerative changes resulting in severe left and moderate right foraminal stenosis.   Patient Stated Goals "To manage the pain as best I can"   Currently in Pain? No/denies   Pain Score 0-No pain  Least 0/10, Avg 4/10, Worst 4/10   Pain Location Neck   Pain Orientation Left;Mid;Lateral   Pain Descriptors / Indicators Aching;Dull   Pain Type Acute pain   Pain Radiating Towards n/a   Pain Onset More than a month ago   Pain Frequency Intermittent   Aggravating Factors  looking down or to L   Pain Relieving Factors Tylenol   Effect of Pain on Daily Activities Difficulty turning head to check blind spot while driving            [facility] PT Assessment - 10/18/15 1315    Assessment   Medical Diagnosis Spondylolisthesis, cervical region   Referring Provider Marylyn Ishihara L. Cabbell, MD   Onset Date/Surgical Date --  3 months   Hand Dominance Right   Next MD Visit PRN   Prior Therapy none   Balance Screen   Has the patient fallen in the past 6 months No   Has the patient had a decrease in activity level because of a fear of falling?  No   Is the patient reluctant to leave their home because of a fear of falling?  No   Prior Function   Level of Independence Independent   Vocation Retired   Visual merchandiser, golf, ballroom dancing   Observation/Other Assessments   Focus on Therapeutic Outcomes (FOTO)  Neck 65% (35% limitation); predicted 68% (32% limitation)   Posture/Postural Control   Posture/Postural Control Postural limitations   Postural Limitations Forward head;Rounded Shoulders  mild   ROM / Strength   AROM / PROM / Strength  AROM;Strength   AROM   Overall AROM Comments Shoulder flexion & abduction WFL, mildly limited in ER/IR   AROM Assessment Site Cervical;Shoulder   Cervical Flexion 34   Cervical Extension 48   Cervical - Right Side Bend 28   Cervical - Left Side Bend 20   Cervical - Right Rotation 39   Cervical - Left Rotation 32   Strength   Overall Strength Comments B shoulders >/= 4+/5   Strength Assessment Site Shoulder;Hand   Right/Left hand Right;Left   Right Hand Grip (lbs) 42   Left Hand Grip (lbs) 52   Palpation   Palpation comment mild ttp L lateral mid c-spine         Today's Treatment  Manual Manual cervical distraction in neutral to ~  10 dg cervical flexion 5x30" - pt noting positive reponse PROM of cervical spine in all directions B UT stretch x30" - pt reporting good stretch L>R B LS stretch attempted but deferred d/t increased pain  Mechanical Traction Cervical spine in hooklying - 10 dg flexion pull, 14#/7#, 60"/20", 10'          PT Education - 10/18/15 1358    Education provided Yes   Education Details PT eval findings, POC and role of traction   Person(s) Educated Patient   Methods Explanation;Demonstration   Comprehension Verbalized understanding          PT Short Term Goals - 10/18/15 1359    PT SHORT TERM GOAL #1   Title Independent with initial HEP by 11/02/15   Status New           PT Long Term Goals - 10/18/15 1400    PT LONG TERM GOAL #1   Title Independent with advanced HEP/gym program as indicated by 12/21/15   Status New   PT LONG TERM GOAL #2   Title Pt will consistently demonstrate neutral spine and shoulder posture by 12/21/15   Status New   PT LONG TERM GOAL #3   Title Pt will report ability to adequately turn head to check blind spot while driving w/o increased neck pain/discomfort by 12/21/15   Status New               Plan - 10/18/15 1358    Clinical Impression Statement [place] is a 70 y/o male who presents to OP PT with  neck pain of ~3 months duration originating in conjunction with LBP upon attempt to resume working out. Pt previously had episode of neck pain 8-9 years which responded well to injections at the time.  Recent cervical imaging reveals extensive degenerative changes along with multilevel DDD resulting in multilevel foraminal stenosis along with central canal stenosis, for which pt states MD has told him there are limited treatment and/or surgical options. Pt denies pain at time of eval but states pain typically at 4/10 on average/at worst. Cervical ROM limited in all planes except extension WFL with pt noting some discomfort/tightness with flexion and L sidebending & rotation. B shoulder ROM WNL/WFL other than mild restriction in B ER/IR with strength 4+/5 to 5/5 and grip strength 42# on R, 52# on L. Pt mildly ttp over L mid cervical paraspinals, but otherwise denies ttp. Pt demonstrates slightly forward head and rounded shoulder posture. Pain and ROM restrictions limit pt from turning head to check mirrors or blind spot adequately while driving. POC will focus on improving cervical flexibility along with soft tissue pliability and anterior stretching, postural training and posterior strengthening, manual therapy / modalities PRN for pain and mechanical traction as benefit noted.   Rehab Potential Good   PT Frequency 2x / week   PT Duration 6 weeks   PT Treatment/Interventions Patient/family education;Traction;Ultrasound;Moist Heat;Electrical Stimulation;Manual techniques;Passive range of motion;Taping;Neuromuscular re-education;Therapeutic exercise;ADLs/Self Care Home Management   PT Next Visit Plan Assess response to mechanical traction; Create initial HEP for postural education and stabilization; Manual therapy for STM and PROM as tolerated; Modalities PRN for pain management; Continue mechanical traction as benefit noted   Consulted and Agree with Plan of Care Patient      Patient will benefit from  skilled therapeutic intervention in order to improve the following deficits and impairments:  Pain, Decreased range of motion, Impaired flexibility, Postural dysfunction  Visit Diagnosis: Cervicalgia  G-Codes - 10/18/15 1400    Functional Assessment Tool Used Neck FOTO = 65% (35% limitation)   Functional Limitation Changing and maintaining body position   Changing and Maintaining Body Position Current Status [id number]) At least 20 percent but less than 40 percent impaired, limited or restricted   Changing and Maintaining Body Position Goal Status [id number]) At least 20 percent but less than 40 percent impaired, limited or restricted       Problem List Patient Active Problem List   Diagnosis Date Noted  . Essential hypertension 08/09/2015  . Current use of beta blocker 08/09/2015  . Moderate persistent asthma 02/20/2015  . Other allergic rhinitis 02/20/2015  . PCP NOTES >>> 01/11/2015  . Cerebellar infarct ([place]) 06/26/2014  . BPH (benign prostatic hyperplasia) 06/22/2014  . Annual physical exam 02/20/2014  . Amnesia 02/20/2014  . Prediabetes 02/05/2013  . Benign paroxysmal positional vertigo 11/19/2012  . CAD (coronary artery disease) 02/07/2012  . GERD 01/01/2009  . SKIN CANCER, HX OF 12/28/2007  . HYPERLIPIDEMIA 06/16/2007  . HTN (hypertension) 06/16/2007  . Asthma, chronic 06/16/2007  . LUMBOSACRAL RADICULOPATHY 03/01/2007  . NEPHROLITHIASIS, HX OF 05/14/2006    Percival Spanish, PT, MPT 10/19/2015, 11:53 AM  [facility] [address]  [place], [place], [address] Phone: [phone]   Fax:  [phone]  Name: DENISON FARWELL MRN: [id number] Date of Birth: November 16, 1945

## 2015-10-18 NOTE — Patient Instructions (Addendum)
  GO TO THE FRONT DESK Schedule your next appointment for a  Physical by 04-2016   Schedule labs to be done early in the morning, fasting

## 2015-10-19 ENCOUNTER — Other Ambulatory Visit (INDEPENDENT_AMBULATORY_CARE_PROVIDER_SITE_OTHER): Payer: PPO

## 2015-10-19 DIAGNOSIS — R739 Hyperglycemia, unspecified: Secondary | ICD-10-CM | POA: Diagnosis not present

## 2015-10-19 DIAGNOSIS — R6882 Decreased libido: Secondary | ICD-10-CM | POA: Diagnosis not present

## 2015-10-19 DIAGNOSIS — I10 Essential (primary) hypertension: Secondary | ICD-10-CM

## 2015-10-19 LAB — BASIC METABOLIC PANEL
BUN: 17 mg/dL (ref 6–23)
CALCIUM: 9.2 mg/dL (ref 8.4–10.5)
CO2: 24 meq/L (ref 19–32)
CREATININE: 1.04 mg/dL (ref 0.40–1.50)
Chloride: 104 mEq/L (ref 96–112)
GFR: 75.57 mL/min (ref >60.00)
Glucose, Bld: 112 mg/dL — ABNORMAL HIGH (ref 70–99)
Potassium: 4.1 mEq/L (ref 3.5–5.1)
Sodium: 140 mEq/L (ref 135–145)

## 2015-10-19 LAB — HEMOGLOBIN A1C: HEMOGLOBIN A1C: 6.1 % (ref 4.6–6.5)

## 2015-10-22 LAB — TESTOS,TOTAL,FREE AND SHBG (FEMALE)
SEX HORMONE BINDING GLOB.: 16 nmol/L — AB (ref 22–77)
TESTOSTERONE,TOTAL,LC/MS/MS: 236 ng/dL — AB (ref 250–1100)
Testosterone, Free: 45.1 pg/mL (ref 35.0–155.0)

## 2015-10-23 ENCOUNTER — Ambulatory Visit: Payer: PPO | Admitting: Physical Therapy

## 2015-10-23 DIAGNOSIS — M542 Cervicalgia: Secondary | ICD-10-CM | POA: Diagnosis not present

## 2015-10-23 NOTE — Therapy (Signed)
[facility] [address]  [place], [place], [address] Phone: [phone]   Fax:  [phone]  Physical Therapy Treatment  Patient Details  Name: George Alvarez MRN: [id number] Date of Birth: 26-Aug-1945 Referring Provider: Marylyn Ishihara L. Christella Noa, MD  Encounter Date: 10/23/2015      PT End of Session - 10/23/15 0847    Visit Number 2   Number of Visits 12   Date for PT Re-Evaluation 12/21/15   PT Start Time 0847   PT Stop Time 0944   PT Time Calculation (min) 57 min   Activity Tolerance Patient tolerated treatment well   Behavior During Therapy [facility] for tasks assessed/performed      Past Medical History  Diagnosis Date  . Hyperlipidemia   . GERD (gastroesophageal reflux disease)   . Perennial allergic rhinitis   . Asthma     post infectious; envirronmental triggers  . Coronary artery disease     a. 02/2012 Cath/PCI: LM 10, LAD min irregs, LCX large, OM1 sm, 40 ost, OM2 95p (5.0x16 Veriflex & 4.5x12 Veriflex BMS'), RCA 30p, 57m, 30d, PDA/PLA min irregs, EF 65%  . Hypertension   . Skin cancer     Basal and squamous cell cancers, greater than 20  . BPH (benign prostatic hyperplasia)     With urinary obstruction  . Asthma, chronic 06/16/2007    Qualifier: Diagnosis of  By: Linna Darner MD, Gwyndolyn Saxon   Onset:as child Triggers (environmental, infectious, allergic): all, mainly environmental triggers Rescue inhaler [id number] Maintenance medications/ response:Singulair,Qvar Smoking history:never Family history pulmonary disease: no    . Benign paroxysmal positional vertigo 11/19/2012    Diagnosed at [facility], [place]  Physical therapy appointment pending   . Anal fissure   . Diverticulosis   . Fundic gland polyps of stomach, benign 2010  . Tubular adenoma of colon 2010  . Stroke ([place]) 2016  . Erectile dysfunction due to arterial insufficiency   . History of elevated PSA   . Nocturia   . Peyronie's disease   . Basal  cell carcinoma of chest wall 2016    1.8 cm, treated with electrodessication and currettage    Past Surgical History  Procedure Laterality Date  . Septoplasty    . Hernia repair    . Vasectomy    . Tonsillectomy and adenoidectomy    . Trigger finger release    . Epidural steroids  2007, 2009    X 2 @ cervical &, lumbar)  . Colonoscopy  2003, 2011    negative  . Upper gastrointestinal endoscopy  2011    gastric polyps  . Coronary angioplasty with stent placement  02/06/2012    OM2  bare metal   . Left heart catheterization with coronary angiogram N/A 02/06/2012    Procedure: LEFT HEART CATHETERIZATION WITH CORONARY ANGIOGRAM;  Surgeon: [name], MD;  Location: [facility] CATH LAB;  Service: Cardiovascular;  Laterality: N/A;  . Percutaneous coronary stent intervention (pci-s)  02/06/2012    Procedure: PERCUTANEOUS CORONARY STENT INTERVENTION (PCI-S);  Surgeon: [name], MD;  Location: [facility] CATH LAB;  Service: Cardiovascular;;  . Intravascular ultrasound  02/06/2012    Procedure: INTRAVASCULAR ULTRASOUND;  Surgeon: [name], MD;  Location: [facility] CATH LAB;  Service: Cardiovascular;;  . Epidural block injection  04-2015    Dr Brien Few    There were no vitals filed for this visit.      Subjective Assessment - 10/23/15 0850  Subjective Pt reports he is scheduled for an epidural on 11/07/15 for his low back. Pt noted that manual and mechanical traction felt good during last PT visit.   Patient Stated Goals "To manage the pain as best I can"   Currently in Pain? Yes   Pain Score 4    Pain Location Neck           Today's Treatment  TherEx UBE - lvl 1.5 fwd/back x 2' each Chin tuck 10x3" B UT stretch with hand on edge of seat 2x30" Doorway pec stretch 2x30" B Shoulder Rows with green TB 10x3" B Scapular Retraction + Shoulder Extension to neutral with green TB 10x3" Hooklying on 1/2 foam roll:   Chest/pec stretch x2'   B Shoulder Horiz ABD with  green TB 10x3"   B Shoulder ER with green TB 10x3"  Manual STM to B pecs, UT & LS (L > R) Manual cervical distraction in neutral to ~10 dg cervical flexion 5x30" PROM of cervical spine in all directions  Mechanical Traction Cervical spine in hooklying - 10 dg flexion pull, 16#/8#, 60"/20", 12'          PT Education - 10/23/15 0930    Education provided Yes   Education Details Initial HEP   Person(s) Educated Patient   Methods Explanation;Demonstration;Handout   Comprehension Verbalized understanding;Returned demonstration;Need further instruction          PT Short Term Goals - 10/23/15 0930    PT SHORT TERM GOAL #1   Title Independent with initial HEP by 11/02/15   Status On-going           PT Long Term Goals - 10/23/15 0930    PT LONG TERM GOAL #1   Title Independent with advanced HEP/gym program as indicated by 12/21/15   Status On-going   PT LONG TERM GOAL #2   Title Pt will consistently demonstrate neutral spine and shoulder posture by 12/21/15   Status On-going   PT LONG TERM GOAL #3   Title Pt will report ability to adequately turn head to check blind spot while driving w/o increased neck pain/discomfort by 12/21/15   Status On-going               Plan - 10/23/15 0931    Clinical Impression Statement Pt reporting that both manual and mechanical traction felt good but states no signigicant change in pain levels after first treatment. Pt instructed in postural awareness and initial HEP emphasizing anterior flexibility and posterior/scapular stabilization to promote improved posture with good tolerance reported for exercises. Some ttp noted in B pecs with manual stretching and STM. Traction continued with slightly increased pull today.   PT Treatment/Interventions Patient/family education;Traction;Ultrasound;Moist Heat;Electrical Stimulation;Manual techniques;Passive range of motion;Taping;Neuromuscular re-education;Therapeutic exercise;ADLs/Self Care Home  Management   PT Next Visit Plan Review initial HEP for postural education and stabilization; Manual therapy for STM and PROM as tolerated; Modalities PRN for pain management; Continue mechanical traction as benefit noted   Consulted and Agree with Plan of Care Patient      Patient will benefit from skilled therapeutic intervention in order to improve the following deficits and impairments:  Pain, Decreased range of motion, Impaired flexibility, Postural dysfunction  Visit Diagnosis: Cervicalgia     Problem List Patient Active Problem List   Diagnosis Date Noted  . Essential hypertension 08/09/2015  . Current use of beta blocker 08/09/2015  . Moderate persistent asthma 02/20/2015  . Other allergic rhinitis 02/20/2015  . PCP NOTES >>> 01/11/2015  .  Cerebellar infarct ([place]) 06/26/2014  . BPH (benign prostatic hyperplasia) 06/22/2014  . Annual physical exam 02/20/2014  . Amnesia 02/20/2014  . Prediabetes 02/05/2013  . Benign paroxysmal positional vertigo 11/19/2012  . CAD (coronary artery disease) 02/07/2012  . GERD 01/01/2009  . SKIN CANCER, HX OF 12/28/2007  . HYPERLIPIDEMIA 06/16/2007  . HTN (hypertension) 06/16/2007  . Asthma, chronic 06/16/2007  . LUMBOSACRAL RADICULOPATHY 03/01/2007  . NEPHROLITHIASIS, HX OF 05/14/2006    Percival Spanish, PT, MPT 10/23/2015, 12:52 PM  [facility] [address]  [place], [place], [address] Phone: [phone]   Fax:  [phone]  Name: George Alvarez MRN: [id number] Date of Birth: 03/08/1946

## 2015-10-24 ENCOUNTER — Other Ambulatory Visit: Payer: Self-pay | Admitting: Internal Medicine

## 2015-10-26 ENCOUNTER — Ambulatory Visit: Payer: PPO

## 2015-10-26 DIAGNOSIS — M542 Cervicalgia: Secondary | ICD-10-CM

## 2015-10-26 NOTE — Therapy (Signed)
[facility] [address]  [place], [place], [address] Phone: [phone]   Fax:  [phone]  Physical Therapy Treatment  Patient Details  Name: George Alvarez MRN: [id number] Date of Birth: 1945-06-16 Referring Provider: Marylyn Ishihara L. Christella Noa, MD  Encounter Date: 10/26/2015      PT End of Session - 10/26/15 1007    Visit Number 3   Number of Visits 12   Date for PT Re-Evaluation 12/21/15   PT Start Time 0935   PT Stop Time 1032   PT Time Calculation (min) 57 min   Activity Tolerance Patient tolerated treatment well   Behavior During Therapy [facility] for tasks assessed/performed      Past Medical History  Diagnosis Date  . Hyperlipidemia   . GERD (gastroesophageal reflux disease)   . Perennial allergic rhinitis   . Asthma     post infectious; envirronmental triggers  . Coronary artery disease     a. 02/2012 Cath/PCI: LM 10, LAD min irregs, LCX large, OM1 sm, 40 ost, OM2 95p (5.0x16 Veriflex & 4.5x12 Veriflex BMS'), RCA 30p, 62m, 30d, PDA/PLA min irregs, EF 65%  . Hypertension   . Skin cancer     Basal and squamous cell cancers, greater than 20  . BPH (benign prostatic hyperplasia)     With urinary obstruction  . Asthma, chronic 06/16/2007    Qualifier: Diagnosis of  By: Linna Darner MD, Gwyndolyn Saxon   Onset:as child Triggers (environmental, infectious, allergic): all, mainly environmental triggers Rescue inhaler [id number] Maintenance medications/ response:Singulair,Qvar Smoking history:never Family history pulmonary disease: no    . Benign paroxysmal positional vertigo 11/19/2012    Diagnosed at [facility] [place]  Physical therapy appointment pending   . Anal fissure   . Diverticulosis   . Fundic gland polyps of stomach, benign 2010  . Tubular adenoma of colon 2010  . Stroke ([place]) 2016  . Erectile dysfunction due to arterial insufficiency   . History of elevated PSA   . Nocturia   . Peyronie's disease   . Basal  cell carcinoma of chest wall 2016    1.8 cm, treated with electrodessication and currettage    Past Surgical History  Procedure Laterality Date  . Septoplasty    . Hernia repair    . Vasectomy    . Tonsillectomy and adenoidectomy    . Trigger finger release    . Epidural steroids  2007, 2009    X 2 @ cervical &, lumbar)  . Colonoscopy  2003, 2011    negative  . Upper gastrointestinal endoscopy  2011    gastric polyps  . Coronary angioplasty with stent placement  02/06/2012    OM2  bare metal   . Left heart catheterization with coronary angiogram N/A 02/06/2012    Procedure: LEFT HEART CATHETERIZATION WITH CORONARY ANGIOGRAM;  Surgeon: [name], MD;  Location: [facility] CATH LAB;  Service: Cardiovascular;  Laterality: N/A;  . Percutaneous coronary stent intervention (pci-s)  02/06/2012    Procedure: PERCUTANEOUS CORONARY STENT INTERVENTION (PCI-S);  Surgeon: [name], MD;  Location: [facility] CATH LAB;  Service: Cardiovascular;;  . Intravascular ultrasound  02/06/2012    Procedure: INTRAVASCULAR ULTRASOUND;  Surgeon: [name], MD;  Location: [facility] CATH LAB;  Service: Cardiovascular;;  . Epidural block injection  04-2015    Dr Brien Few    There were no vitals filed for this visit.      Subjective Assessment - 10/26/15 [id number]  Subjective Pt. reports a 4/10 neck pain initially today and low back pain that is constant however unable to assign a number to this.  Pt. reports he has performed the HEP activities each day and feels that the green TB is too easy with retraction activities.     Patient Stated Goals "To manage the pain as best I can"   Currently in Pain? Yes   Pain Score 4    Pain Location Neck   Pain Orientation Left;Mid;Lateral   Pain Descriptors / Indicators Aching;Dull   Pain Type Acute pain   Pain Onset More than a month ago   Multiple Pain Sites No      Today's treatment:  Therex: UBE: level 1.5, 2 min forwards / backwards B lev. Scap.,  UT stretch x 30 sec each side  Doorway stretch low and mid x 30 sec each  Hooklying on 1/2 foam bolster:         Chest/pec stretch x2'        B Shoulder Horiz ABD with blue TB 10 x 3"        Alternating shoulder flexion/extension with blue TB x 10 reps each way B Shoulder Rows with blue TB 10 x 3" B Scapular Retraction + Shoulder Extension to neutral with green TB 10 x 3" TRX chest stretch low and mid x 30 sec each way BATCA low row 25# 3" x 10 reps   Mechanical Traction Cervical spine in hooklying - 10 dg flexion pull, 18#/10#, 60"/20", 12'        PT Short Term Goals - 10/23/15 0930    PT SHORT TERM GOAL #1   Title Independent with initial HEP by 11/02/15   Status On-going           PT Long Term Goals - 10/23/15 0930    PT LONG TERM GOAL #1   Title Independent with advanced HEP/gym program as indicated by 12/21/15   Status On-going   PT LONG TERM GOAL #2   Title Pt will consistently demonstrate neutral spine and shoulder posture by 12/21/15   Status On-going   PT LONG TERM GOAL #3   Title Pt will report ability to adequately turn head to check blind spot while driving w/o increased neck pain/discomfort by 12/21/15   Status On-going               Plan - 10/26/15 1008    Clinical Impression Statement Pt. reports a 4/10 neck pain initially today and low back pain that is constant however unable to assign a number to this.  Pt. reports he has performed the HEP activities each day and feels that the green TB is too easy with retraction activities; pt. reports mild benefit from mechanical cervical traction last treatment.  Pt. tolerated all scapular strengthening activiies and chest/pec stretches well today with decreased pain at neck following retraction activities; cervical mechanical traction progressed today to 18#/10# in 10 dg flexion pull today with pt. tolerating well.      PT Treatment/Interventions Patient/family education;Traction;Ultrasound;Moist Heat;Electrical  Stimulation;Manual techniques;Passive range of motion;Taping;Neuromuscular re-education;Therapeutic exercise;ADLs/Self Care Home Management   PT Next Visit Plan Manual therapy for STM and PROM as tolerated; Modalities PRN for pain management; Continue mechanical traction as benefit noted      Patient will benefit from skilled therapeutic intervention in order to improve the following deficits and impairments:  Pain, Decreased range of motion, Impaired flexibility, Postural dysfunction  Visit Diagnosis: Cervicalgia     Problem List Patient  Active Problem List   Diagnosis Date Noted  . Essential hypertension 08/09/2015  . Current use of beta blocker 08/09/2015  . Moderate persistent asthma 02/20/2015  . Other allergic rhinitis 02/20/2015  . PCP NOTES >>> 01/11/2015  . Cerebellar infarct ([place]) 06/26/2014  . BPH (benign prostatic hyperplasia) 06/22/2014  . Annual physical exam 02/20/2014  . Amnesia 02/20/2014  . Prediabetes 02/05/2013  . Benign paroxysmal positional vertigo 11/19/2012  . CAD (coronary artery disease) 02/07/2012  . GERD 01/01/2009  . SKIN CANCER, HX OF 12/28/2007  . HYPERLIPIDEMIA 06/16/2007  . HTN (hypertension) 06/16/2007  . Asthma, chronic 06/16/2007  . LUMBOSACRAL RADICULOPATHY 03/01/2007  . NEPHROLITHIASIS, HX OF 05/14/2006    Bess Harvest, PTA 10/26/2015, 11:43 AM  [facility] [address]  [place], [place], [address] Phone: [phone]   Fax:  [phone]  Name: George Alvarez MRN: [id number] Date of Birth: [birth year]

## 2015-10-29 DIAGNOSIS — N401 Enlarged prostate with lower urinary tract symptoms: Secondary | ICD-10-CM | POA: Diagnosis not present

## 2015-10-30 ENCOUNTER — Ambulatory Visit: Payer: PPO | Admitting: Physical Therapy

## 2015-10-30 ENCOUNTER — Other Ambulatory Visit: Payer: Self-pay | Admitting: Internal Medicine

## 2015-10-30 DIAGNOSIS — M542 Cervicalgia: Secondary | ICD-10-CM

## 2015-10-30 NOTE — Therapy (Signed)
[facility] [address]  [place], [place], [address] Phone: [phone]   Fax:  [phone]  Physical Therapy Treatment  Patient Details  Name: George Alvarez MRN: [id number] Date of Birth: 24-Jan-1946 Referring Provider: Marylyn Ishihara L. Christella Noa, MD  Encounter Date: 10/30/2015      PT End of Session - 10/30/15 0847    Visit Number 4   Number of Visits 12   Date for PT Re-Evaluation 12/21/15   PT Start Time 0847   PT Stop Time 0946   PT Time Calculation (min) 59 min   Activity Tolerance Patient tolerated treatment well   Behavior During Therapy [facility] for tasks assessed/performed      Past Medical History:  Diagnosis Date  . Anal fissure   . Asthma    post infectious; envirronmental triggers  . Asthma, chronic 06/16/2007   Qualifier: Diagnosis of  By: Linna Darner MD, Gwyndolyn Saxon   Onset:as child Triggers (environmental, infectious, allergic): all, mainly environmental triggers Rescue inhaler [id number] Maintenance medications/ response:Singulair,Qvar Smoking history:never Family history pulmonary disease: no    . Basal cell carcinoma of chest wall 2016   1.8 cm, treated with electrodessication and currettage  . Benign paroxysmal positional vertigo 11/19/2012   Diagnosed at [facility]  Physical therapy appointment pending   . BPH (benign prostatic hyperplasia)    With urinary obstruction  . Coronary artery disease    a. 02/2012 Cath/PCI: LM 10, LAD min irregs, LCX large, OM1 sm, 40 ost, OM2 95p (5.0x16 Veriflex & 4.5x12 Veriflex BMS'), RCA 30p, 32m, 30d, PDA/PLA min irregs, EF 65%  . Diverticulosis   . Erectile dysfunction due to arterial insufficiency   . Fundic gland polyps of stomach, benign 2010  . GERD (gastroesophageal reflux disease)   . History of elevated PSA   . Hyperlipidemia   . Hypertension   . Nocturia   . Perennial allergic rhinitis   . Peyronie's disease   . Skin cancer    Basal and  squamous cell cancers, greater than 20  . Stroke ([place]) 2016  . Tubular adenoma of colon 2010    Past Surgical History:  Procedure Laterality Date  . COLONOSCOPY  2003, 2011   negative  . CORONARY ANGIOPLASTY WITH STENT PLACEMENT  02/06/2012   OM2  bare metal   . EPIDURAL BLOCK INJECTION  04-2015   Dr Brien Few  . epidural steroids  2007, 2009   X 2 @ cervical &, lumbar)  . HERNIA REPAIR    . INTRAVASCULAR ULTRASOUND  02/06/2012   Procedure: INTRAVASCULAR ULTRASOUND;  Surgeon: [name], MD;  Location: [facility] CATH LAB;  Service: Cardiovascular;;  . LEFT HEART CATHETERIZATION WITH CORONARY ANGIOGRAM N/A 02/06/2012   Procedure: LEFT HEART CATHETERIZATION WITH CORONARY ANGIOGRAM;  Surgeon: [name], MD;  Location: [facility] CATH LAB;  Service: Cardiovascular;  Laterality: N/A;  . PERCUTANEOUS CORONARY STENT INTERVENTION (PCI-S)  02/06/2012   Procedure: PERCUTANEOUS CORONARY STENT INTERVENTION (PCI-S);  Surgeon: [name], MD;  Location: [facility] CATH LAB;  Service: Cardiovascular;;  . septoplasty    . TONSILLECTOMY AND ADENOIDECTOMY    . TRIGGER FINGER RELEASE    . UPPER GASTROINTESTINAL ENDOSCOPY  2011   gastric polyps  . VASECTOMY      There were no vitals filed for this visit.      Subjective Assessment - 10/30/15 0847    Subjective Pt reports he thinks therapy (and the traction) is helping with no pain most  of the time, just stiffness/tightness.   Patient Stated Goals "To manage the pain as best I can"   Currently in Pain? No/denies   Pain Score 0-No pain   Pain Onset More than a month ago            [facility] PT Assessment - 10/30/15 0847      Assessment   Next MD Visit Epidural injection for low back 11/07/15          Today's Treatment  TherEx UBE - lvl 2.0 fwd/back x 2' each B Shoulder Rows with blue TB 15x3" B Scapular Retraction + Shoulder Extension to neutral with blue TB 15x3" B UT stretch with hand on edge of seat 2x30" Hooklying on  1/2 foam roll:    Chest/pec stretch x2'     Manual STM to B pecs, UT & LS (L > R) TRP to R UT/LS PROM of cervical spine in all directions  TherEx Hooklying on 1/2 foam roll:    B Shoulder Horiz ABD with blue TB 10x3"    Alternating B Shoulder flexion/extension with blue TB 15x3"     B Shoulder ER with blue TB 15x3"    Mechanical Traction Cervical spine in hooklying - 10 dg flexion pull, 18#/10#, 60"/20", 15'          PT Short Term Goals - 10/30/15 0931      PT SHORT TERM GOAL #1   Title Independent with initial HEP by 11/02/15   Status Achieved           PT Long Term Goals - 10/30/15 0931      PT LONG TERM GOAL #1   Title Independent with advanced HEP/gym program as indicated by 12/21/15   Status On-going     PT LONG TERM GOAL #2   Title Pt will consistently demonstrate neutral spine and shoulder posture by 12/21/15   Status On-going     PT LONG TERM GOAL #3   Title Pt will report ability to adequately turn head to check blind spot while driving w/o increased neck pain/discomfort by 12/21/15   Status On-going               Plan - 10/30/15 0930    Clinical Impression Statement Pt demonstrating good initial progress with PT, noting less pain and mostly just stiffness/tightness at present. PROM during manual therapy appears to demonstrate improving sidebending and rotational movement but not formally measured today. Tolerating progression of exercises with no pain reported.   PT Treatment/Interventions Patient/family education;Traction;Ultrasound;Moist Heat;Electrical Stimulation;Manual techniques;Passive range of motion;Taping;Neuromuscular re-education;Therapeutic exercise;ADLs/Self Care Home Management   PT Next Visit Plan Postural education; Anterior stretching and posterior/scapular stabilization; Cervical ROM & strengthening; Manual therapy for cerivcal PROM and STM to pecs, UT, LS as tolerated; Modalities PRN for pain management; Continue mechanical  traction as benefit noted      Patient will benefit from skilled therapeutic intervention in order to improve the following deficits and impairments:  Pain, Decreased range of motion, Impaired flexibility, Postural dysfunction  Visit Diagnosis: Cervicalgia     Problem List Patient Active Problem List   Diagnosis Date Noted  . Essential hypertension 08/09/2015  . Current use of beta blocker 08/09/2015  . Moderate persistent asthma 02/20/2015  . Other allergic rhinitis 02/20/2015  . PCP NOTES >>> 01/11/2015  . Cerebellar infarct ([place]) 06/26/2014  . BPH (benign prostatic hyperplasia) 06/22/2014  . Annual physical exam 02/20/2014  . Amnesia 02/20/2014  . Prediabetes 02/05/2013  . Benign paroxysmal positional  vertigo 11/19/2012  . CAD (coronary artery disease) 02/07/2012  . GERD 01/01/2009  . SKIN CANCER, HX OF 12/28/2007  . HYPERLIPIDEMIA 06/16/2007  . HTN (hypertension) 06/16/2007  . Asthma, chronic 06/16/2007  . LUMBOSACRAL RADICULOPATHY 03/01/2007  . NEPHROLITHIASIS, HX OF 05/14/2006    Percival Spanish, PT, MPT 10/30/2015, 6:23 PM  [facility] [address]  [place], [place], [address] Phone: [phone]   Fax:  [phone]  Name: QUINLAN EDLEY MRN: [id number] Date of Birth: 06-18-1945

## 2015-10-31 ENCOUNTER — Other Ambulatory Visit: Payer: Self-pay | Admitting: Internal Medicine

## 2015-11-02 ENCOUNTER — Ambulatory Visit: Payer: PPO

## 2015-11-02 DIAGNOSIS — M542 Cervicalgia: Secondary | ICD-10-CM

## 2015-11-02 DIAGNOSIS — H8112 Benign paroxysmal vertigo, left ear: Secondary | ICD-10-CM

## 2015-11-02 DIAGNOSIS — R269 Unspecified abnormalities of gait and mobility: Secondary | ICD-10-CM

## 2015-11-02 NOTE — Therapy (Signed)
[facility] [address]  [place], [place], [address] Phone: [phone]   Fax:  [phone]  Physical Therapy Treatment  Patient Details  Name: George Alvarez MRN: [id number] Date of Birth: 30-Oct-1945 Referring Provider: Marylyn Ishihara L. Christella Noa, MD  Encounter Date: 11/02/2015      PT End of Session - 11/02/15 0956    Visit Number 5   Number of Visits 12   Date for PT Re-Evaluation 12/21/15   PT Start Time 0932   PT Stop Time 1030   PT Time Calculation (min) 58 min   Activity Tolerance Patient tolerated treatment well   Behavior During Therapy [facility] for tasks assessed/performed      Past Medical History:  Diagnosis Date  . Anal fissure   . Asthma    post infectious; envirronmental triggers  . Asthma, chronic 06/16/2007   Qualifier: Diagnosis of  By: Linna Darner MD, Gwyndolyn Saxon   Onset:as child Triggers (environmental, infectious, allergic): all, mainly environmental triggers Rescue inhaler [id number] Maintenance medications/ response:Singulair,Qvar Smoking history:never Family history pulmonary disease: no    . Basal cell carcinoma of chest wall 2016   1.8 cm, treated with electrodessication and currettage  . Benign paroxysmal positional vertigo 11/19/2012   Diagnosed at [facility]  Physical therapy appointment pending   . BPH (benign prostatic hyperplasia)    With urinary obstruction  . Coronary artery disease    a. 02/2012 Cath/PCI: LM 10, LAD min irregs, LCX large, OM1 sm, 40 ost, OM2 95p (5.0x16 Veriflex & 4.5x12 Veriflex BMS'), RCA 30p, 17m, 30d, PDA/PLA min irregs, EF 65%  . Diverticulosis   . Erectile dysfunction due to arterial insufficiency   . Fundic gland polyps of stomach, benign 2010  . GERD (gastroesophageal reflux disease)   . History of elevated PSA   . Hyperlipidemia   . Hypertension   . Nocturia   . Perennial allergic rhinitis   . Peyronie's disease   . Skin cancer    Basal and  squamous cell cancers, greater than 20  . Stroke ([place]) 2016  . Tubular adenoma of colon 2010    Past Surgical History:  Procedure Laterality Date  . COLONOSCOPY  2003, 2011   negative  . CORONARY ANGIOPLASTY WITH STENT PLACEMENT  02/06/2012   OM2  bare metal   . EPIDURAL BLOCK INJECTION  04-2015   Dr Brien Few  . epidural steroids  2007, 2009   X 2 @ cervical &, lumbar)  . HERNIA REPAIR    . INTRAVASCULAR ULTRASOUND  02/06/2012   Procedure: INTRAVASCULAR ULTRASOUND;  Surgeon: [name], MD;  Location: [facility] CATH LAB;  Service: Cardiovascular;;  . LEFT HEART CATHETERIZATION WITH CORONARY ANGIOGRAM N/A 02/06/2012   Procedure: LEFT HEART CATHETERIZATION WITH CORONARY ANGIOGRAM;  Surgeon: [name], MD;  Location: [facility] C[facility] LAB;  Service: Cardiovascular;  Laterality: N/A;  . PERCUTANEOUS CORONARY STENT INTERVENTION (PCI-S)  02/06/2012   Procedure: PERCUTANEOUS CORONARY STENT INTERVENTION (PCI-S);  Surgeon: [name], MD;  Location: [facility] CATH LAB;  Service: Cardiovascular;;  . septoplasty    . TONSILLECTOMY AND ADENOIDECTOMY    . TRIGGER FINGER RELEASE    . UPPER GASTROINTESTINAL ENDOSCOPY  2011   gastric polyps  . VASECTOMY      There were no vitals filed for this visit.      Subjective Assessment - 11/02/15 0942    Subjective Pt. reports the neck is only stiff today even after painting two days ago.  Patient Stated Goals "To manage the pain as best I can"   Currently in Pain? No/denies   Pain Score 0-No pain   Multiple Pain Sites No            [facility] PT Assessment - 11/02/15 1007      AROM   AROM Assessment Site Cervical   Cervical Flexion 34   Cervical Extension 63   Cervical - Right Side Bend 22   Cervical - Left Side Bend 17   Cervical - Right Rotation 48   Cervical - Left Rotation 44       Today's Treatment  TherEx NuStep: level 3, 4 min (pt. cued to use the UE most) B Shoulder Rows with black TB 15 x 3" B Scapular  Retraction + Shoulder Extension to neutral with blue TB 15 x 3" Hooklying on 1/2 foam roll:    Chest/pec stretch x2'    Horizontal shoulder abduction with black TB 3" x 15 reps      Alternating B Shoulder flexion/extension with blue TB 15 x 3"  BATCA low row with 25# x 10 reps; tactile cues for scapular retraction and scapular depression  BATCA single arm low with 20# x 10 reps each side; tactile cues for scapular retraction and scapular depression  BATCA pulldown 25# x 10 reps; tactile cues for scapular retraction and scapular depression BATCA pulldown 35# x 10 reps; tactile cues for scapular retraction and scapular depression  Mechanical Traction Cervical spine in hooklying - 10 dg flexion pull, 20#/12#, 60"/20", 15'        PT Short Term Goals - 10/30/15 0931      PT SHORT TERM GOAL #1   Title Independent with initial HEP by 11/02/15   Status Achieved           PT Long Term Goals - 10/30/15 0931      PT LONG TERM GOAL #1   Title Independent with advanced HEP/gym program as indicated by 12/21/15   Status On-going     PT LONG TERM GOAL #2   Title Pt will consistently demonstrate neutral spine and shoulder posture by 12/21/15   Status On-going     PT LONG TERM GOAL #3   Title Pt will report ability to adequately turn head to check blind spot while driving w/o increased neck pain/discomfort by 12/21/15   Status On-going               Plan - 11/02/15 0959    Clinical Impression Statement Pt. reports the neck is pain free today, only stiff; pt. reports he has been pain free the last few days even following painting parts of his house two days ago.  Pt. tolerated all scapular strengthening and chest stretching well today with addition of TRX row, and lat pulldown machine.  Pt. able to demo improved cervical AROM today in all motions except cervical B sidebending; pt. nearly unchanged in these motions.  Pt. noted benefit with cervical mechanical traction thus continued  and progressed to 20#/12# today which was tolerated well.      PT Treatment/Interventions Patient/family education;Traction;Ultrasound;Moist Heat;Electrical Stimulation;Manual techniques;Passive range of motion;Taping;Neuromuscular re-education;Therapeutic exercise;ADLs/Self Care Home Management   PT Next Visit Plan Postural education; Anterior stretching and posterior/scapular stabilization; Cervical ROM & strengthening; Manual therapy for cerivcal PROM and STM to pecs, UT, LS as tolerated; Modalities PRN for pain management; Continue mechanical traction as benefit noted      Patient will benefit from skilled therapeutic intervention in order to  improve the following deficits and impairments:  Pain, Decreased range of motion, Impaired flexibility, Postural dysfunction  Visit Diagnosis: Cervicalgia  BPPV (benign paroxysmal positional vertigo), left  Abnormality of gait     Problem List Patient Active Problem List   Diagnosis Date Noted  . Essential hypertension 08/09/2015  . Current use of beta blocker 08/09/2015  . Moderate persistent asthma 02/20/2015  . Other allergic rhinitis 02/20/2015  . PCP NOTES >>> 01/11/2015  . Cerebellar infarct ([place]) 06/26/2014  . BPH (benign prostatic hyperplasia) 06/22/2014  . Annual physical exam 02/20/2014  . Amnesia 02/20/2014  . Prediabetes 02/05/2013  . Benign paroxysmal positional vertigo 11/19/2012  . CAD (coronary artery disease) 02/07/2012  . GERD 01/01/2009  . SKIN CANCER, HX OF 12/28/2007  . HYPERLIPIDEMIA 06/16/2007  . HTN (hypertension) 06/16/2007  . Asthma, chronic 06/16/2007  . LUMBOSACRAL RADICULOPATHY 03/01/2007  . NEPHROLITHIASIS, HX OF 05/14/2006    Bess Harvest, PTA 11/02/2015, 11:07 AM  [facility] [address]  [place], [place], [address] Phone: [phone]   Fax:  [phone]  Name: KARAN BOSQUE MRN: [id number] Date of Birth: 1945/05/15

## 2015-11-05 DIAGNOSIS — R351 Nocturia: Secondary | ICD-10-CM | POA: Diagnosis not present

## 2015-11-05 DIAGNOSIS — R972 Elevated prostate specific antigen [PSA]: Secondary | ICD-10-CM | POA: Diagnosis not present

## 2015-11-05 DIAGNOSIS — N5201 Erectile dysfunction due to arterial insufficiency: Secondary | ICD-10-CM | POA: Diagnosis not present

## 2015-11-05 DIAGNOSIS — N401 Enlarged prostate with lower urinary tract symptoms: Secondary | ICD-10-CM | POA: Diagnosis not present

## 2015-11-06 ENCOUNTER — Ambulatory Visit: Payer: PPO

## 2015-11-07 DIAGNOSIS — M5416 Radiculopathy, lumbar region: Secondary | ICD-10-CM | POA: Diagnosis not present

## 2015-11-07 DIAGNOSIS — M4806 Spinal stenosis, lumbar region: Secondary | ICD-10-CM | POA: Diagnosis not present

## 2015-11-09 ENCOUNTER — Ambulatory Visit: Payer: PPO | Admitting: Physical Therapy

## 2015-11-13 ENCOUNTER — Ambulatory Visit: Payer: PPO

## 2015-11-15 ENCOUNTER — Ambulatory Visit: Payer: PPO | Attending: Neurosurgery

## 2015-11-15 DIAGNOSIS — M542 Cervicalgia: Secondary | ICD-10-CM | POA: Diagnosis not present

## 2015-11-15 NOTE — Therapy (Signed)
[facility] [address]  [place], [place], [address] Phone: [phone]   Fax:  [phone]  Physical Therapy Treatment  Patient Details  Name: George Alvarez MRN: [id number] Date of Birth: 09-25-1945 Referring Provider: Marylyn Ishihara L. Christella Noa, MD  Encounter Date: 11/15/2015      PT End of Session - 11/15/15 1015    Visit Number 6   Number of Visits 12   Date for PT Re-Evaluation 12/21/15   PT Start Time 0932   PT Stop Time 1025   PT Time Calculation (min) 53 min   Activity Tolerance Patient tolerated treatment well   Behavior During Therapy [facility] for tasks assessed/performed      Past Medical History:  Diagnosis Date  . Anal fissure   . Asthma, chronic 06/16/2007   Qualifier: Diagnosis of  By: Linna Darner MD, Gwyndolyn Saxon   Onset:as child Triggers (environmental, infectious, allergic): all, mainly environmental triggers Rescue inhaler [id number] Maintenance medications/ response:Singulair,Qvar Smoking history:never Family history pulmonary disease: no    . Basal cell carcinoma of chest wall 2016   1.8 cm, treated with electrodessication and currettage  . Benign paroxysmal positional vertigo 11/19/2012   Diagnosed at [facility]  Physical therapy appointment pending   . BPH (benign prostatic hyperplasia)    With urinary obstruction  . Coronary artery disease    a. 02/2012 Cath/PCI: LM 10, LAD min irregs, LCX large, OM1 sm, 40 ost, OM2 95p (5.0x16 Veriflex & 4.5x12 Veriflex BMS'), RCA 30p, 24m, 30d, PDA/PLA min irregs, EF 65%  . Diverticulosis   . Erectile dysfunction due to arterial insufficiency   . Fundic gland polyps of stomach, benign 2010  . GERD (gastroesophageal reflux disease)   . History of elevated PSA   . Hyperlipidemia   . Hypertension   . Nocturia   . Perennial allergic rhinitis   . Peyronie's disease   . Skin cancer    Basal and squamous cell cancers, greater than 20  . Stroke ([place]) 2016   . Tubular adenoma of colon 2010    Past Surgical History:  Procedure Laterality Date  . COLONOSCOPY  2003, 2011   negative  . CORONARY ANGIOPLASTY WITH STENT PLACEMENT  02/06/2012   OM2  bare metal   . EPIDURAL BLOCK INJECTION  04-2015   Dr Brien Few  . epidural steroids  2007, 2009   X 2 @ cervical &, lumbar)  . HERNIA REPAIR    . INTRAVASCULAR ULTRASOUND  02/06/2012   Procedure: INTRAVASCULAR ULTRASOUND;  Surgeon: [name], MD;  Location: [facility] CATH LAB;  Service: Cardiovascular;;  . LEFT HEART CATHETERIZATION WITH CORONARY ANGIOGRAM N/A 02/06/2012   Procedure: LEFT HEART CATHETERIZATION WITH CORONARY ANGIOGRAM;  Surgeon: [name], MD;  Location: [facility] CATH LAB;  Service: Cardiovascular;  Laterality: N/A;  . PERCUTANEOUS CORONARY STENT INTERVENTION (PCI-S)  02/06/2012   Procedure: PERCUTANEOUS CORONARY STENT INTERVENTION (PCI-S);  Surgeon: [name], MD;  Location: [facility] CATH LAB;  Service: Cardiovascular;;  . septoplasty    . TONSILLECTOMY AND ADENOIDECTOMY    . TRIGGER FINGER RELEASE    . UPPER GASTROINTESTINAL ENDOSCOPY  2011   gastric polyps  . VASECTOMY      There were no vitals filed for this visit.      Subjective Assessment - 11/15/15 0944    Subjective Pt. reports only neck stiffness today initially;    Patient Stated Goals "To manage the pain as best I can"   Currently  in Pain? No/denies   Pain Score 0-No pain   Multiple Pain Sites No       Today's Treatment  TherEx UBE: 3.5 level, 2 min forwards/2 min backwards B Shoulder Rows with black TB 15 x 3" B Scapular Retraction + Shoulder Extension to neutral with blueTB 15 x 3" Hooklying on 1/2 foam roll:    Chest/pec stretch x2'    Horizontal shoulder abduction with black TB 3" x 15 reps       Alternating B Shoulder flexion/extension with blue TB 15 x 3"        B ER with green TB x 15 reps  BATCA low row with 45# x 10 reps; tactile cues for scapular retraction and scapular  depression  BATCA single arm low with 25# x 10 reps each side; tactile cues for scapular retraction and scapular depression  BATCA pulldown 35# x 10 reps; tactile cues for scapular retraction and scapular depression Doorway stretch low and mid x 30 sec each   Mechanical Traction Cervical spine in hooklying - 10 dg flexion pull, 18#/12#, 60"/20", 13'         PT Short Term Goals - 10/30/15 0931      PT SHORT TERM GOAL #1   Title Independent with initial HEP by 11/02/15   Status Achieved           PT Long Term Goals - 10/30/15 0931      PT LONG TERM GOAL #1   Title Independent with advanced HEP/gym program as indicated by 12/21/15   Status On-going     PT LONG TERM GOAL #2   Title Pt will consistently demonstrate neutral spine and shoulder posture by 12/21/15   Status On-going     PT LONG TERM GOAL #3   Title Pt will report ability to adequately turn head to check blind spot while driving w/o increased neck pain/discomfort by 12/21/15   Status On-going               Plan - 11/15/15 1016    Clinical Impression Statement Pt. 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 reported he felt a little sore in the neck following progression of mechanical traction to cervical spine last treatment.  Pt. returning to therapy after nearly two week absence and epidural injection to lower back on 8/2; pt. admits he has not performed HEP in a week.  Cervical traction resistance lowered to 18#/12# today with good pt. response.  Today's treatment focused on scapular strengthening and anterior chest stretching therex; no pain with any of these activities.  Pt. to see PT tomorrow morning for treatment.     PT Treatment/Interventions Patient/family education;Traction;Ultrasound;Moist Heat;Electrical Stimulation;Manual techniques;Passive range of motion;Taping;Neuromuscular re-education;Therapeutic exercise;ADLs/Self Care Home Management    PT Next Visit Plan Postural education; Anterior stretching and posterior/scapular stabilization; Cervical ROM & strengthening; Manual therapy for cerivcal PROM and STM to pecs, UT, LS as tolerated; Modalities PRN for pain management; Continue mechanical traction as benefit noted      Patient will benefit from skilled therapeutic intervention in order to improve the following deficits and impairments:  Pain, Decreased range of motion, Impaired flexibility, Postural dysfunction  Visit Diagnosis: Cervicalgia     Problem List Patient Active Problem List   Diagnosis Date Noted  . Essential hypertension 08/09/2015  . Current use of beta blocker 08/09/2015  . Moderate persistent asthma 02/20/2015  . Other allergic rhinitis 02/20/2015  .  PCP NOTES >>> 01/11/2015  . Cerebellar infarct ([place]) 06/26/2014  . BPH (benign prostatic hyperplasia) 06/22/2014  . Annual physical exam 02/20/2014  . Amnesia 02/20/2014  . Prediabetes 02/05/2013  . Benign paroxysmal positional vertigo 11/19/2012  . CAD (coronary artery disease) 02/07/2012  . GERD 01/01/2009  . SKIN CANCER, HX OF 12/28/2007  . HYPERLIPIDEMIA 06/16/2007  . HTN (hypertension) 06/16/2007  . Asthma, chronic 06/16/2007  . LUMBOSACRAL RADICULOPATHY 03/01/2007  . NEPHROLITHIASIS, HX OF 05/14/2006    Bess Harvest, PTA 11/15/2015, 11:50 AM  [facility] [address]  [place], [place], [address] Phone: [phone]   Fax:  [phone]  Name: George Alvarez MRN: [id number] Date of Birth: 12-18-1945

## 2015-11-16 ENCOUNTER — Ambulatory Visit: Payer: PPO | Admitting: Physical Therapy

## 2015-11-16 DIAGNOSIS — M542 Cervicalgia: Secondary | ICD-10-CM | POA: Diagnosis not present

## 2015-11-16 NOTE — Therapy (Signed)
[facility] [address]  [place], [place], [address] Phone: [phone]   Fax:  [phone]  Physical Therapy Treatment  Patient Details  Name: George Alvarez MRN: [id number] Date of Birth: 1945/08/26 Referring Provider: Marylyn Ishihara L. Christella Noa, MD  Encounter Date: 11/16/2015      PT End of Session - 11/16/15 0928    Visit Number 7   Number of Visits 12   Date for PT Re-Evaluation 12/21/15   PT Start Time 0928   Activity Tolerance Patient tolerated treatment well   Behavior During Therapy [facility] for tasks assessed/performed      Past Medical History:  Diagnosis Date  . Anal fissure   . Asthma, chronic 06/16/2007   Qualifier: Diagnosis of  By: Linna Darner MD, Gwyndolyn Saxon   Onset:as child Triggers (environmental, infectious, allergic): all, mainly environmental triggers Rescue inhaler ZYS:[id number] Maintenance medications/ response:Singulair,Qvar Smoking history:never Family history pulmonary disease: no    . Basal cell carcinoma of chest wall 2016   1.8 cm, treated with electrodessication and currettage  . Benign paroxysmal positional vertigo 11/19/2012   Diagn[facility]  Physical therapy appointment pending   . BPH (benign prostatic hyperplasia)    With urinary obstruction  . Coronary artery disease    a. 02/2012 Cath/PCI: LM 10, LAD min irregs, LCX large, OM1 sm, 40 ost, OM2 95p (5.0x16 Veriflex & 4.5x12 Veriflex BMS'), RCA 30p, 35m 30d, PDA/PLA min irregs, EF 65%  . Diverticulosis   . Erectile dysfunction due to arterial insufficiency   . Fundic gland polyps of stomach, benign 2010  . GERD (gastroesophageal reflux disease)   . History of elevated PSA   . Hyperlipidemia   . Hypertension   . Nocturia   . Perennial allergic rhinitis   . Peyronie's disease   . Skin cancer    Basal and squamous cell cancers, greater than 20  . Stroke [place] 2016  . Tubular adenoma of colon 2010    Past Surgical  History:  Procedure Laterality Date  . COLONOSCOPY  2003, 2011   negative  . CORONARY ANGIOPLASTY WITH STENT PLACEMENT  02/06/2012   OM2  bare metal   . EPIDURAL BLOCK INJECTION  04-2015   Dr BBrien Few . epidural steroids  2007, 2009   X 2 @ cervical &, lumbar)  . HERNIA REPAIR    . INTRAVASCULAR ULTRASOUND  02/06/2012   Procedure: INTRAVASCULAR ULTRASOUND;  Surgeon: CBurnell Blanks MD;  [facility];  Service: Cardiovascular;;  . LEFT HEART CATHETERIZATION WITH CORONARY ANGIOGRAM N/A 02/06/2012   Procedure: LEFT HEART CATHETERIZATION WITH CORONARY ANGIOGRAM;  Surgeon: CBurnell Blanks MD;  [facility];  Service: Cardiovascular;  Laterality: N/A;  . PERCUTANEOUS CORONARY STENT INTERVENTION (PCI-S)  02/06/2012   Procedure: PERCUTANEOUS CORONARY STENT INTERVENTION (PCI-S);  Surgeon: CBurnell Blanks MD;  [facility];  Service: Cardiovascular;;  . septoplasty    . TONSILLECTOMY AND ADENOIDECTOMY    . TRIGGER FINGER RELEASE    . UPPER GASTROINTESTINAL ENDOSCOPY  2011   gastric polyps  . VASECTOMY      There were no vitals filed for this visit.      Subjective Assessment - 11/16/15 0930    Subjective Pt reporting mostly stiffness with some cracking and still limited motion, but no pain recently.   Patient Stated Goals "To manage the pain as best I can"   Currently in Pain? No/denies  Today's Treatment  TherEx UBE: 3.5 level, fwd/back x 2' each  Hooklying on 1/2 foam roll:    Chest/pec stretch x2' B Shoulder Horiz ABD with black TB 15x3" B Alt Shoulder flexion + opposite shoulder extension with black TB 15x3"    B ER with blue TB 15x3'  Prone over corner of mat table- scapular retraction with slight cervical/thoracic extension:    I's x10    T's x10 Prone on elbows - Cervical diagonals x10 each B Shoulder Rows with blackTB 15x3" BATCA pulldown 35# x15 Doorway stretch low, mid & high x30" each  Mechanical  Traction Cervical spine in hooklying - 10 dg flexion pull, 18#/12#, 60"/20", 12'          PT Short Term Goals - 10/30/15 0931      PT SHORT TERM GOAL #1   Title Independent with initial HEP by 11/02/15   Status Achieved           PT Long Term Goals - 11/16/15 0954      PT LONG TERM GOAL #1   Title Independent with advanced HEP/gym program as indicated by 12/21/15   Status On-going     PT LONG TERM GOAL #2   Title Pt will consistently demonstrate neutral spine and shoulder posture by 12/21/15   Status Partially Met     PT LONG TERM GOAL #3   Title Pt will report ability to adequately turn head to check blind spot while driving w/o increased neck pain/discomfort by 12/21/15   Status On-going               Plan - 11/16/15 1012    Clinical Impression Statement Progressed exercises to include prone scapular strengthening with slight thoracic/cervical extension with good pt tolerance. Also introduced POE cervical diagonals with good tolerance and no dizziness noted (pt has h/o BPPV). If good tolerance noted at next visit will consider adding to HEP as pt will be traveling for the next few weeks.   PT Treatment/Interventions Patient/family education;Traction;Ultrasound;Moist Heat;Electrical Stimulation;Manual techniques;Passive range of motion;Taping;Neuromuscular re-education;Therapeutic exercise;ADLs/Self Care Home Management   PT Next Visit Plan Update HEP in prep for upcoming break from PT while traveling; Postural education; Anterior stretching and posterior/scapular stabilization; Cervical ROM & strengthening; Manual therapy for cerivcal PROM and STM to pecs, UT, LS as tolerated; Modalities PRN for pain management; Continue mechanical traction as benefit noted   Consulted and Agree with Plan of Care Patient      Patient will benefit from skilled therapeutic intervention in order to improve the following deficits and impairments:  Pain, Decreased range of motion, Impaired  flexibility, Postural dysfunction  Visit Diagnosis: Cervicalgia     Problem List Patient Active Problem List   Diagnosis Date Noted  . Essential hypertension 08/09/2015  . Current use of beta blocker 08/09/2015  . Moderate persistent asthma 02/20/2015  . Other allergic rhinitis 02/20/2015  . PCP NOTES >>> 01/11/2015  . Cerebellar infarct ([place]) 06/26/2014  . BPH (benign prostatic hyperplasia) 06/22/2014  . Annual physical exam 02/20/2014  . Amnesia 02/20/2014  . Prediabetes 02/05/2013  . Benign paroxysmal positional vertigo 11/19/2012  . CAD (coronary artery disease) 02/07/2012  . GERD 01/01/2009  . SKIN CANCER, HX OF 12/28/2007  . HYPERLIPIDEMIA 06/16/2007  . HTN (hypertension) 06/16/2007  . Asthma, chronic 06/16/2007  . LUMBOSACRAL RADICULOPATHY 03/01/2007  . NEPHROLITHIASIS, HX OF 05/14/2006    Percival Spanish, PT, MPT 11/16/2015, 10:17 AM  [facility] [address]  [place], [place], [address] Phone: [phone]   Fax:  [phone]  Name: George Alvarez MRN: [id number] Date of Birth: Dec 18, 1945

## 2015-11-20 ENCOUNTER — Ambulatory Visit: Payer: PPO | Admitting: Physical Therapy

## 2015-11-20 DIAGNOSIS — M542 Cervicalgia: Secondary | ICD-10-CM

## 2015-11-20 NOTE — Therapy (Signed)
[facility] [address]  [place], [place], [address] Phone: [phone]   Fax:  [phone]  Physical Therapy Treatment  Patient Details  Name: George Alvarez MRN: [id number] Date of Birth: November 29, 1945 Referring Provider: Marylyn Ishihara L. Christella Noa, MD  Encounter Date: 11/20/2015      PT End of Session - 11/20/15 0930    Visit Number 8   Number of Visits 12   Date for PT Re-Evaluation 12/21/15   PT Start Time 0930   PT Stop Time 1012   PT Time Calculation (min) 42 min   Activity Tolerance Patient tolerated treatment well   Behavior During Therapy [facility] for tasks assessed/performed      Past Medical History:  Diagnosis Date  . Anal fissure   . Asthma, chronic 06/16/2007   Qualifier: Diagnosis of  By: Linna Darner MD, Gwyndolyn Saxon   Onset:as child Triggers (environmental, infectious, allergic): all, mainly environmental triggers Rescue inhaler OIN:OMVEHM Maintenance medications/ response:Singulair,Qvar Smoking history:never Family history pulmonary disease: no    . Basal cell carcinoma of chest wall 2016   1.8 cm, treated with electrodessication and currettage  . Benign paroxysmal positional vertigo 11/19/2012   Diagn[facility] at [facility]  Physical therapy appointment pending   . BPH (benign prostatic hyperplasia)    With urinary obstruction  . Coronary artery disease    a. 02/2012 Cath/PCI: LM 10, LAD min irregs, LCX large, OM1 sm, 40 ost, OM2 95p (5.0x16 Veriflex & 4.5x12 Veriflex BMS'), RCA 30p, 65m 30d, PDA/PLA min irregs, EF 65%  . Diverticulosis   . Erectile dysfunction due to arterial insufficiency   . Fundic gland polyps of stomach, benign 2010  . GERD (gastroesophageal reflux disease)   . History of elevated PSA   . Hyperlipidemia   . Hypertension   . Nocturia   . Perennial allergic rhinitis   . Peyronie's disease   . Skin cancer    Basal and squamous cell cancers, greater than 20  . [place]   . Tubular adenoma of colon 2010    Past Surgical History:  Procedure Laterality Date  . COLONOSCOPY  2003, 2011   negative  . CORONARY ANGIOPLASTY WITH STENT PLACEMENT  02/06/2012   OM2  bare metal   . EPIDURAL BLOCK INJECTION  04-2015   Dr BBrien Few . epidural steroids  2007, 2009   X 2 @ cervical &, lumbar)  . HERNIA REPAIR    . INTRAVASCULAR ULTRASOUND  02/06/2012   Procedure: INTRAVASCULAR ULTRASOUND;  Surgeon: CBurnell Blanks MD;  [facility];  Service: Cardiovascular;;  . LEFT HEART CATHETERIZATION WITH CORONARY ANGIOGRAM N/A 02/06/2012   Procedure: LEFT HEART CATHETERIZATION WITH CORONARY ANGIOGRAM;  Surgeon: CBurnell Blanks MD;  [facility];  Service: Cardiovascular;  Laterality: N/A;  . PERCUTANEOUS CORONARY STENT INTERVENTION (PCI-S)  02/06/2012   Procedure: PERCUTANEOUS CORONARY STENT INTERVENTION (PCI-S);  Surgeon: CBurnell Blanks MD;  [facility];  Service: Cardiovascular;;  . septoplasty    . TONSILLECTOMY AND ADENOIDECTOMY    . TRIGGER FINGER RELEASE    . UPPER GASTROINTESTINAL ENDOSCOPY  2011   gastric polyps  . VASECTOMY      There were no vitals filed for this visit.      Subjective Assessment - 11/20/15 0930    Subjective Pt reporting increased soreness the day following the last therapy session up to ~5/10. Soreness has susbsided, but continues to note stiffness.  Patient Stated Goals "To manage the pain as best I can"   Currently in Pain? No/denies            [facility] PT Assessment - 11/20/15 0930      AROM   AROM Assessment Site Cervical   Cervical Flexion 43   Cervical Extension 55   Cervical - Right Side Bend 31   Cervical - Left Side Bend 24   Cervical - Right Rotation 55   Cervical - Left Rotation 52     Strength   Right Hand Grip (lbs) 42, 46, 55   Left Hand Grip (lbs) 50, 52, 56           Today's Treatment  TherEx UBE: 3.5 level, fwd/back x 2' each  Hooklying on 1/2 foam  roll:    Chest/pec stretch x2' B Shoulder Horiz ABD with black TB 15x3" B Alt Shoulder flexion + opposite shoulder extension with black TB 15x3" B ER with blue TB 15x3'  Prone over corner of mat table- scapular retraction with slight cervical/thoracic extension:    I's x15    T's x15 B Shoulder Rows with blackTB 15x3" BATCA pulldown 35# x15 Doorway stretch low, mid & high x30" each  Mechanical Traction Pt asked to defer traction today          PT Education - 11/20/15 1014    Education provided Yes   Education Details Prone scapular retraction added to HEP - pt to perform over corner of bed rather than [place]   Person(s) Educated Patient   Methods Explanation;Demonstration;Handout   Comprehension Verbalized understanding;Returned demonstration          PT Short Term Goals - 10/30/15 0931      PT SHORT TERM GOAL #1   Title Independent with initial HEP by 11/02/15   Status Achieved           PT Long Term Goals - 11/20/15 1015      PT LONG TERM GOAL #1   Title Independent with advanced HEP/gym program as indicated by 12/21/15   Status On-going     PT LONG TERM GOAL #2   Title Pt will consistently demonstrate neutral spine and shoulder posture by 12/21/15   Status Partially Met     PT LONG TERM GOAL #3   Title Pt will report ability to adequately turn head to check blind spot while driving w/o increased neck pain/discomfort by 12/21/15   Status On-going               Plan - 11/20/15 1012    Clinical Impression Statement Pt demonstrating good progress with cervical ROM in all planes and reports pain well controlled other than flare-up following last PT visit which has since resolved, with continued stiffness being his main complaint at this time. Pt preparing to leave on vacation for the next few weeks, therefore verbally reviewed current HEP and added prone scapular retraction + thoracic/cervical extension. Pt will be returning at the end of  current cert period date range, therefore will plan to assess readiness for D/C vs need for recert at that time.   PT Treatment/Interventions Patient/family education;Traction;Ultrasound;Moist Heat;Electrical Stimulation;Manual techniques;Passive range of motion;Taping;Neuromuscular re-education;Therapeutic exercise;ADLs/Self Care Home Management   PT Next Visit Plan Assess readiness for D/C vs need for recert; Postural education; Anterior stretching and posterior/scapular stabilization; Cervical ROM & strengthening; Manual therapy for cerivcal PROM and STM to pecs, UT, LS as tolerated; Modalities PRN for pain management; Continue mechanical traction as benefit noted  Consulted and Agree with Plan of Care Patient      Patient will benefit from skilled therapeutic intervention in order to improve the following deficits and impairments:  Pain, Decreased range of motion, Impaired flexibility, Postural dysfunction  Visit Diagnosis: Cervicalgia     Problem List Patient Active Problem List   Diagnosis Date Noted  . Essential hypertension 08/09/2015  . Current use of beta blocker 08/09/2015  . Moderate persistent asthma 02/20/2015  . Other allergic rhinitis 02/20/2015  . PCP NOTES >>> 01/11/2015  . Cerebellar infarct ([place]) 06/26/2014  . BPH (benign prostatic hyperplasia) 06/22/2014  . Annual physical exam 02/20/2014  . Amnesia 02/20/2014  . Prediabetes 02/05/2013  . Benign paroxysmal positional vertigo 11/19/2012  . CAD (coronary artery disease) 02/07/2012  . GERD 01/01/2009  . SKIN CANCER, HX OF 12/28/2007  . HYPERLIPIDEMIA 06/16/2007  . HTN (hypertension) 06/16/2007  . Asthma, chronic 06/16/2007  . LUMBOSACRAL RADICULOPATHY 03/01/2007  . NEPHROLITHIASIS, HX OF 05/14/2006    Percival Spanish, PT, MPT 11/20/2015, 10:22 AM  [facility] [address]  [place], [place], [address] Phone: [phone]   Fax:   [phone]  Name: George Alvarez MRN: [id number] Date of Birth: Feb 17, 1946

## 2015-11-27 ENCOUNTER — Other Ambulatory Visit: Payer: Self-pay | Admitting: Internal Medicine

## 2015-12-18 ENCOUNTER — Ambulatory Visit: Payer: PPO | Attending: Neurosurgery | Admitting: Physical Therapy

## 2015-12-18 DIAGNOSIS — M542 Cervicalgia: Secondary | ICD-10-CM | POA: Insufficient documentation

## 2015-12-18 NOTE — Therapy (Signed)
[facility] [address]  [place], [place], [address] Phone: [phone]   Fax:  [phone]  Physical Therapy Treatment  Patient Details  Name: George Alvarez MRN: [id number] Date of Birth: 1945/05/14 Referring Provider: Marylyn Ishihara L. Christella Noa, MD  Encounter Date: 12/18/2015      PT End of Session - 12/18/15 0940    Visit Number 9   Number of Visits 12   Date for PT Re-Evaluation 12/21/15   PT Start Time 0939   PT Stop Time 1017   PT Time Calculation (min) 38 min   Activity Tolerance Patient tolerated treatment well   Behavior During Therapy [facility] for tasks assessed/performed      Past Medical History:  Diagnosis Date  . Anal fissure   . Asthma, chronic 06/16/2007   Qualifier: Diagnosis of  By: Linna Darner MD, Gwyndolyn Saxon   Onset:as child Triggers (environmental, infectious, allergic): all, mainly environmental triggers Rescue inhaler [id number] Maintenance medications/ response:Singulair,Qvar Smoking history:never Family history pulmonary disease: no    . Basal cell carcinoma of chest wall 2016   1.8 cm, treated with electrodessication and currettage  . Benign paroxysmal positional vertigo 11/19/2012   Diagnosed at [facility], [facility]  Physical therapy appointment pending   . BPH (benign prostatic hyperplasia)    With urinary obstruction  . Coronary artery disease    a. 02/2012 Cath/PCI: LM 10, LAD min irregs, LCX large, OM1 sm, 40 ost, OM2 95p (5.0x16 Veriflex & 4.5x12 Veriflex BMS'), RCA 30p, 51m 30d, PDA/PLA min irregs, EF 65%  . Diverticulosis   . Erectile dysfunction due to arterial insufficiency   . Fundic gland polyps of stomach, benign 2010  . GERD (gastroesophageal reflux disease)   . History of elevated PSA   . Hyperlipidemia   . Hypertension   . Nocturia   . Perennial allergic rhinitis   . Peyronie's disease   . Skin cancer    Basal and squamous cell cancers, greater than 20  . Stroke ([place] 2016   . Tubular adenoma of colon 2010    Past Surgical History:  Procedure Laterality Date  . COLONOSCOPY  2003, 2011   negative  . CORONARY ANGIOPLASTY WITH STENT PLACEMENT  02/06/2012   OM2  bare metal   . EPIDURAL BLOCK INJECTION  04-2015   Dr BBrien Few . epidural steroids  2007, 2009   X 2 @ cervical &, lumbar)  . HERNIA REPAIR    . INTRAVASCULAR ULTRASOUND  02/06/2012   Procedure: INTRAVASCULAR ULTRASOUND;  Surgeon: CBurnell Blanks MD;  [facility];  Service: Cardiovascular;;  . LEFT HEART CATHETERIZATION WITH CORONARY ANGIOGRAM N/A 02/06/2012   Procedure: LEFT HEART CATHETERIZATION WITH CORONARY ANGIOGRAM;  Surgeon: CBurnell Blanks MD;  [facility];  Service: Cardiovascular;  Laterality: N/A;  . PERCUTANEOUS CORONARY STENT INTERVENTION (PCI-S)  02/06/2012   Procedure: PERCUTANEOUS CORONARY STENT INTERVENTION (PCI-S);  Surgeon: CBurnell Blanks MD;  [facility];  Service: Cardiovascular;;  . septoplasty    . TONSILLECTOMY AND ADENOIDECTOMY    . TRIGGER FINGER RELEASE    . UPPER GASTROINTESTINAL ENDOSCOPY  2011   gastric polyps  . VASECTOMY      There were no vitals filed for this visit.          [facility]      Assessment   Medical Diagnosis Spondylolisthesis, cervical region   Onset Date/Surgical Date --  3 months  Hand Dominance Right   Next MD Visit PRN     Observation/Other Assessments   Focus on Therapeutic Outcomes (FOTO)  Neck 75% (25% limitation)     AROM   AROM Assessment Site Cervical   Cervical Flexion 42   Cervical Extension 58   Cervical - Right Side Bend 38   Cervical - Left Side Bend 32   Cervical - Right Rotation 70   Cervical - Left Rotation 65     Strength   Overall Strength Comments B shoulders = 5/5           Today's Treatment  TherEx UBE: 3.5 level, fwd/back x 2' each  Prone over green (65 cm) [place]ball - scapular retraction with slight cervical/thoracic  extension: I's x15 T's x15 BATCA pulldown 35# x15 BATCA low row with 35# x15 BATCA mid row with 35# x15  BATCA single arm low row with 20# x15          PT Education - 12/18/15 1017    Education provided Yes   Education Details Review of HEP & gym program to be continued upon D/C   Person(s) Educated Patient   Methods Explanation;Demonstration   Comprehension Verbalized understanding;Returned demonstration          PT Short Term Goals - 10/30/15 0931      PT SHORT TERM GOAL #1   Title Independent with initial HEP by 11/02/15   Status Achieved           PT Long Term Goals - 12/18/15 0955      PT LONG TERM GOAL #1   Title Independent with advanced HEP/gym program as indicated by 12/21/15   Status Achieved     PT LONG TERM GOAL #2   Title Pt will consistently demonstrate neutral spine and shoulder posture by 12/21/15   Status Achieved     PT LONG TERM GOAL #3   Title Pt will report ability to adequately turn head to check blind spot while driving w/o increased neck pain/discomfort by 12/21/15   Status Achieved               Plan - 12/18/15 1017    Clinical Impression Statement Pt returning after ~1 month absence while traveling. Reports limited completion of HEP while on vacation, but states remains pain-free. Cervical ROM now [facility] and pt reporting improved ability to turn to check blind spot while driving. Pt reporting improved postural awareness and states his wife reminds him when he forgets. Pt is independent with HEP and reviewed appropraite gym exercises. All goals met for this episode, therefore will proceed with discharge.    PT Treatment/Interventions Patient/family education;Traction;Ultrasound;Moist Heat;Electrical Stimulation;Manual techniques;Passive range of motion;Taping;Neuromuscular re-education;Therapeutic exercise;ADLs/Self Care Home Management   PT Next Visit Plan Discharge   Consulted and Agree with Plan of Care Patient      Patient  will benefit from skilled therapeutic intervention in order to improve the following deficits and impairments:  Pain, Decreased range of motion, Impaired flexibility, Postural dysfunction  Visit Diagnosis: Cervicalgia     Problem List Patient Active Problem List   Diagnosis Date Noted  . Essential hypertension 08/09/2015  . Current use of beta blocker 08/09/2015  . Moderate persistent asthma 02/20/2015  . Other allergic rhinitis 02/20/2015  . PCP NOTES >>> 01/11/2015  . Cerebellar infarct ([place]) 06/26/2014  . BPH (benign prostatic hyperplasia) 06/22/2014  . Annual physical exam 02/20/2014  . Amnesia 02/20/2014  . Prediabetes 02/05/2013  . Benign paroxysmal positional vertigo 11/19/2012  .  CAD (coronary artery disease) 02/07/2012  . GERD 01/01/2009  . SKIN CANCER, HX OF 12/28/2007  . HYPERLIPIDEMIA 06/16/2007  . HTN (hypertension) 06/16/2007  . Asthma, chronic 06/16/2007  . LUMBOSACRAL RADICULOPATHY 03/01/2007  . NEPHROLITHIASIS, HX OF 05/14/2006    Percival Spanish, PT, MPT 12/18/2015, 12:18 PM  [facility] [address]  [place], [place], [address] Phone: [phone]   Fax:  [phone]  Name: George Alvarez MRN: [id number] Date of Birth: 12-20-1945   PHYSICAL THERAPY DISCHARGE SUMMARY  Visits from Start of Care: 9  Current functional level related to goals / functional outcomes:    Refer to above clinical impression   Remaining deficits:   As above. Pt to continue with HEP & gym program.   Education / Equipment:    HEP, education on use of appropriate gym equipment   Plan: Patient agrees to discharge.  Patient goals were met. Patient is being discharged due to meeting the stated rehab goals.  ?????    Percival Spanish, PT, MPT 12/18/15, 12:19 PM  [facility] [address]  [place], [place], [address] Phone: [phone]   Fax:   [phone]

## 2015-12-21 ENCOUNTER — Encounter: Payer: Self-pay | Admitting: Cardiovascular Disease

## 2015-12-21 ENCOUNTER — Other Ambulatory Visit: Payer: Self-pay | Admitting: Cardiovascular Disease

## 2015-12-21 ENCOUNTER — Encounter: Payer: Self-pay | Admitting: Internal Medicine

## 2015-12-24 MED ORDER — CLOPIDOGREL BISULFATE 75 MG PO TABS: 75.0000 mg | ORAL_TABLET | Freq: Every day | ORAL | 11 refills | Status: DC

## 2015-12-24 MED ORDER — FAMOTIDINE 20 MG PO TABS: 20.0000 mg | ORAL_TABLET | Freq: Every day | ORAL | 11 refills | Status: DC

## 2015-12-24 NOTE — Telephone Encounter (Signed)
I spoke with pt and he has read message from Dr. Angelena Form. Will send prescriptions for Clopidogrel and Pepcid to his pharmacy.

## 2016-01-04 ENCOUNTER — Encounter: Payer: Self-pay | Admitting: Cardiovascular Disease

## 2016-01-04 ENCOUNTER — Ambulatory Visit (INDEPENDENT_AMBULATORY_CARE_PROVIDER_SITE_OTHER): Payer: PPO | Admitting: Cardiovascular Disease

## 2016-01-04 VITALS — BP 128/84 | HR 68 | Ht 70.0 in | Wt 222.8 lb

## 2016-01-04 DIAGNOSIS — I2510 Atherosclerotic heart disease of native coronary artery without angina pectoris: Secondary | ICD-10-CM | POA: Diagnosis not present

## 2016-01-04 DIAGNOSIS — E785 Hyperlipidemia, unspecified: Secondary | ICD-10-CM

## 2016-01-04 DIAGNOSIS — I10 Essential (primary) hypertension: Secondary | ICD-10-CM | POA: Diagnosis not present

## 2016-01-04 DIAGNOSIS — Z8249 Family history of ischemic heart disease and other diseases of the circulatory system: Secondary | ICD-10-CM | POA: Diagnosis not present

## 2016-01-04 MED ORDER — NITROGLYCERIN 0.4 MG SL SUBL: 0.4000 mg | SUBLINGUAL_TABLET | SUBLINGUAL | 6 refills | Status: DC | PRN

## 2016-01-04 NOTE — Patient Instructions (Signed)

## 2016-01-04 NOTE — Progress Notes (Signed)
\   Chief Complaint  Patient presents with  . Coronary Artery Disease    History of Present Illness: 70 yo male with history of HTN, HLD, CAD who is here today for follow up. He underwent a stress test on 02/05/12 which was suggestive of ischemia. Cardiac cath on 02/06/12 with 95% proximal OM2 lesion and a 60% mid RCA lesion. EF was 65%. His OM2 was very large in caliber and was treated with bare-metal stents x 2. Medical management of RCA stenosis which was felt to be moderate. Patient was placed on Effient only due to his history of intolerance to aspirin. This has since been changed to Plavix due to cost and his Nexium was changed to Pepcid. He has chronic dizziness and is felt to have vertigo. Normal stress myoview December 2015.  He is here today for follow up. Denies chest pain, shortness of breath, syncope, orthopnea, PND or edema. He is exercising several days per week. He is exercising every day. He is limited somewhat by his back pain. He has spinal stenosis.   Primary Care Physician: Kathlene November, MD  Past Medical History:  Diagnosis Date  . Anal fissure   . Asthma, chronic 06/16/2007   Qualifier: Diagnosis of  By: Linna Darner MD, Gwyndolyn Saxon   Onset:as child Triggers (environmental, infectious, allergic): all, mainly environmental triggers Rescue inhaler [id number] Maintenance medications/ response:Singulair,Qvar Smoking history:never Family history pulmonary disease: no    . Basal cell carcinoma of chest wall 2016   1.8 cm, treated with electrodessication and currettage  . Benign paroxysmal positional vertigo 11/19/2012   Diagnosed at [facility], [facility]  Physical therapy appointment pending   . BPH (benign prostatic hyperplasia)    With urinary obstruction  . Coronary artery disease    a. 02/2012 Cath/PCI: LM 10, LAD min irregs, LCX large, OM1 sm, 40 ost, OM2 95p (5.0x16 Veriflex & 4.5x12 Veriflex BMS'), RCA 30p, 16m, 30d, PDA/PLA min irregs, EF 65%  . Diverticulosis   . Erectile  dysfunction due to arterial insufficiency   . Fundic gland polyps of stomach, benign 2010  . GERD (gastroesophageal reflux disease)   . History of elevated PSA   . Hyperlipidemia   . Hypertension   . Nocturia   . Perennial allergic rhinitis   . Peyronie's disease   . Skin cancer    Basal and squamous cell cancers, greater than 20  . Stroke ([place]) 2016  . Tubular adenoma of colon 2010    Past Surgical History:  Procedure Laterality Date  . COLONOSCOPY  2003, 2011   negative  . CORONARY ANGIOPLASTY WITH STENT PLACEMENT  02/06/2012   OM2  bare metal   . EPIDURAL BLOCK INJECTION  04-2015   Dr Brien Few  . epidural steroids  2007, 2009   X 2 @ cervical &, lumbar)  . HERNIA REPAIR    . INTRAVASCULAR ULTRASOUND  02/06/2012   Procedure: INTRAVASCULAR ULTRASOUND;  Surgeon: [name], MD;  Location: [facility] LAB;  Service: Cardiovascular;;  . LEFT HEART CATHETERIZATION WITH CORONARY ANGIOGRAM N/A 02/06/2012   Procedure: LEFT HEART CATHETERIZATION WITH CORONARY ANGIOGRAM;  Surgeon: [name], MD;  Location[facility];  Service: Cardiovascular;  Laterality: N/A;  . PERCUTANEOUS CORONARY STENT INTERVENTION (PCI-S)  02/06/2012   Procedure: PERCUTANEOUS CORONARY STENT INTERVENTION (PCI-S);  Surgeon: [name], MD;  Location: [facility] CATH LAB;  Service: Cardiovascular;;  . septoplasty    . TONSILLECTOMY AND ADENOIDECTOMY    . TRIGGER FINGER RELEASE    .  UPPER GASTROINTESTINAL ENDOSCOPY  2011   gastric polyps  . VASECTOMY      Current Outpatient Prescriptions  Medication Sig Dispense Refill  . albuterol (PROVENTIL HFA;VENTOLIN HFA) 108 (90 BASE) MCG/ACT inhaler Inhale 2 puffs into the lungs as needed. For wheezing 1 Inhaler 1  . beclomethasone (QVAR) 80 MCG/ACT inhaler Inhale 1 puff into the lungs 2 (two) times daily. 1 Inhaler 5  . Chlorpheniramine Maleate ([employer] ALLERGY PO) Take 1 tablet by mouth daily.     . clopidogrel (PLAVIX) 75 MG tablet Take 1 tablet (75  mg total) by mouth daily. 30 tablet 11  . famotidine (PEPCID) 20 MG tablet Take 1 tablet (20 mg total) by mouth daily. 30 tablet 11  . fluorouracil (EFUDEX) 5 % cream Apply 1 application topically daily. Reported on 09/10/2015    . losartan-hydrochlorothiazide (HYZAAR) 50-12.5 MG tablet Take 1 tablet by mouth daily. 30 tablet 8  . metoprolol tartrate (LOPRESSOR) 25 MG tablet Take 0.5 tablets (12.5 mg total) by mouth 2 (two) times daily. 30 tablet 8  . mometasone (NASONEX) 50 MCG/ACT nasal spray Place 2 sprays into the nose daily. 17 g 5  . montelukast (SINGULAIR) 10 MG tablet Take 1 tablet (10 mg total) by mouth at bedtime. 30 tablet 3  . nitroGLYCERIN (NITROSTAT) 0.4 MG SL tablet Place 1 tablet (0.4 mg total) under the tongue every 5 (five) minutes as needed for chest pain. 25 tablet 6  . rosuvastatin (CRESTOR) 40 MG tablet Take 1 tablet (40 mg total) by mouth daily. 30 tablet 8  . tadalafil (CIALIS) 5 MG tablet Take 5 mg by mouth daily as needed for erectile dysfunction.     No current facility-administered medications for this visit.     Allergies  Allergen Reactions  . Aspirin     Angioedema in 1980s Medi Alert bracelet  is recommended  . Nifedipine Rash    edema    Social History   Social History  . Marital status: Married    Spouse name: N/A  . Number of children: 3  . Years of education: N/A   Occupational History  . 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 Smoking status: Never Smoker  . Smokeless tobacco: Never Used  . Alcohol use 0.0 oz/week     Comment: Wine occasionally  . Drug use: No  . Sexual activity: Yes    Partners: Female   Other Topics Concern  . Not on file   Social History Narrative   3 daughters, 34 g-children   lifes w/ wife in [place]    Family History  Problem Relation Age of Onset  . Heart attack Father 65    died @ 7  . Heart disease Mother   . Breast cancer Mother   . Esophageal cancer Mother      Barrett's  . Heart attack Maternal Grandmother     after 65  . Dementia Maternal Grandmother     CVA  . Breast cancer Maternal Grandmother   . Diabetes Maternal Grandmother   . Stroke Maternal Grandmother     in 76s  . Heart attack Paternal Uncle 3  . Breast cancer Maternal Aunt   . Colon cancer Neg Hx   . Prostate cancer Neg Hx   . Colon polyps Neg Hx     Review of Systems:  As stated in the HPI and otherwise negative.   BP 128/84   Pulse 68   Ht 5\' 10"  (  1.778 m)   Wt 222 lb 12.8 oz (101.1 kg)   BMI 31.97 kg/m   Physical Examination: General: Well developed, well nourished, NAD  HEENT: OP clear, mucus membranes moist  SKIN: warm, dry. No rashes. Neuro: No focal deficits  Musculoskeletal: Muscle strength 5/5 all ext  Psychiatric: Mood and affect normal  Neck: No JVD, no carotid bruits, no thyromegaly, no lymphadenopathy.  Lungs:Clear bilaterally, no wheezes, rhonci, crackles Cardiovascular: Regular rate and rhythm. No murmurs, gallops or rubs. Abdomen:Soft. Bowel sounds present. Non-tender.  Extremities: No lower extremity edema. Pulses are 2 + in the bilateral DP/PT.  Cardiac cath 02/06/12: Left main: 10% distal stenosis.  Left Anterior Descending Artery: Large caliber vessel that courses to the apex with mild luminal irregularities proximal and distal.  Circumflex Artery: Very large caliber vessel. The first OM is small and has an ostial 40% stenosis. The second OM is very large (5.21mm) and has a 95% stenosis in the proximal portion of the vessel.  Right Coronary Artery: Large, dominant artery with 30% proximal stenosis, 60% mid stenosis, diffuse 30% distal disease. The PDA and PLA are patent with mild plaque disease.  Left Ventricular Angiogram: LVEF=65%  Impression:  1. Double vessel CAD  2. Unstable angina  3. Normal LV systolic function  4. Successful PTCA/bare metal stent x 1 OM2.   EKG:  EKG is ordered today. The ekg ordered today demonstrates NSR, rate 68  bpm. LAFB  Recent Labs: 04/24/2015: ALT 20; Hemoglobin 14.7; Platelets 140.0 10/19/2015: BUN 17; Creatinine, Ser 1.04; Potassium 4.1; Sodium 140   Lipid Panel    Component Value Date/Time   CHOL 116 04/24/2015 1558   TRIG 148.0 04/24/2015 1558   HDL 36.70 (L) 04/24/2015 1558   CHOLHDL 3 04/24/2015 1558   VLDL 29.6 04/24/2015 1558   LDLCALC 50 04/24/2015 1558   LDLDIRECT 94.9 09/24/2012 0822     Wt Readings from Last 3 Encounters:  01/04/16 222 lb 12.8 oz (101.1 kg)  10/18/15 223 lb 4 oz (101.3 kg)  09/10/15 223 lb 6 oz (101.3 kg)     Other studies Reviewed: Additional studies/ records that were reviewed today include: . Review of the above records demonstrates:    Assessment and Plan:   1. CAD: Stable. Continue Plavix. He is intolerant of ASA. Continue beta blocker and statin. Normal stress myoview December 2015.  2. HTN: BP is controlled. No changes.   3. Hyperlipidemia: Will continue statin. Lipids well controlled.    4. Dizziness: Likely vertigo. No carotid bruits.   Current medicines are reviewed at length with the patient today.  The patient does not have concerns regarding medicines.  The following changes have been made:  no change  Labs/ tests ordered today include:   Orders Placed This Encounter  Procedures  . EKG 12-Lead    Disposition:   FU with me in 12  months  Signed, Lauree Chandler, MD 01/04/2016 10:37 AM    [facility] [place], [place], [place]  [address] Phone: [phone]; Fax: [phone]

## 2016-01-24 DIAGNOSIS — Z6832 Body mass index (BMI) 32.0-32.9, adult: Secondary | ICD-10-CM | POA: Diagnosis not present

## 2016-01-24 DIAGNOSIS — M48062 Spinal stenosis, lumbar region with neurogenic claudication: Secondary | ICD-10-CM | POA: Diagnosis not present

## 2016-01-24 DIAGNOSIS — M4312 Spondylolisthesis, cervical region: Secondary | ICD-10-CM | POA: Diagnosis not present

## 2016-01-28 DIAGNOSIS — H524 Presbyopia: Secondary | ICD-10-CM | POA: Diagnosis not present

## 2016-01-28 DIAGNOSIS — E119 Type 2 diabetes mellitus without complications: Secondary | ICD-10-CM | POA: Diagnosis not present

## 2016-01-28 DIAGNOSIS — H52223 Regular astigmatism, bilateral: Secondary | ICD-10-CM | POA: Diagnosis not present

## 2016-01-28 DIAGNOSIS — H5213 Myopia, bilateral: Secondary | ICD-10-CM | POA: Diagnosis not present

## 2016-01-28 LAB — HM DIABETES EYE EXAM

## 2016-01-30 ENCOUNTER — Encounter: Payer: Self-pay | Admitting: Internal Medicine

## 2016-02-16 ENCOUNTER — Other Ambulatory Visit: Payer: Self-pay | Admitting: Pediatrics

## 2016-03-19 DIAGNOSIS — D485 Neoplasm of uncertain behavior of skin: Secondary | ICD-10-CM | POA: Diagnosis not present

## 2016-03-19 DIAGNOSIS — Z85828 Personal history of other malignant neoplasm of skin: Secondary | ICD-10-CM | POA: Diagnosis not present

## 2016-03-19 DIAGNOSIS — L820 Inflamed seborrheic keratosis: Secondary | ICD-10-CM | POA: Diagnosis not present

## 2016-03-19 DIAGNOSIS — L570 Actinic keratosis: Secondary | ICD-10-CM | POA: Diagnosis not present

## 2016-03-19 DIAGNOSIS — Z08 Encounter for follow-up examination after completed treatment for malignant neoplasm: Secondary | ICD-10-CM | POA: Diagnosis not present

## 2016-04-10 ENCOUNTER — Other Ambulatory Visit: Payer: Self-pay

## 2016-04-18 ENCOUNTER — Other Ambulatory Visit: Payer: Self-pay | Admitting: Pediatrics

## 2016-04-29 ENCOUNTER — Encounter: Payer: Self-pay | Admitting: Internal Medicine

## 2016-04-29 ENCOUNTER — Ambulatory Visit (INDEPENDENT_AMBULATORY_CARE_PROVIDER_SITE_OTHER): Payer: PPO | Admitting: Internal Medicine

## 2016-04-29 VITALS — BP 128/80 | HR 59 | Temp 97.9°F | Resp 14 | Ht 70.0 in | Wt 223.5 lb

## 2016-04-29 DIAGNOSIS — Z0000 Encounter for general adult medical examination without abnormal findings: Secondary | ICD-10-CM

## 2016-04-29 DIAGNOSIS — Z1159 Encounter for screening for other viral diseases: Secondary | ICD-10-CM

## 2016-04-29 NOTE — Assessment & Plan Note (Addendum)
Td 2010, 06-2013 per pt ;Pneumonia shot 2013;Prevnar 2015; zostavax 2013.  Had a flu shot  PSA, DRE: Sees Dr. Jeffie Pollock routinely Colonoscopy in 2010, 08-2014, next 5 years per GI letter  Labs: BMP, AST, ALT, FLP, CBC, A1c, hepatitis C Diet and exercise discussed

## 2016-04-29 NOTE — Patient Instructions (Signed)
   GO TO THE FRONT DESK Schedule your next appointment for a  6 month check up  Schedule labs to be done within few days, fasting

## 2016-04-29 NOTE — Progress Notes (Signed)
Subjective:    Patient ID: George Alvarez, male    DOB: [birth year], 71 y.o.   MRN: [id number]  DOS:  04/29/2016 Type of visit - description :cpx  Interval history: In general feeling well, no major concerns, sees multiple specialists regularly. Doing well with lifestyle   Review of Systems Constitutional: No fever. No chills. No unexplained wt changes. No unusual sweats  HEENT: No dental problems, no ear discharge, no facial swelling, no voice changes. No eye discharge, his eyes are slightly red, he already consulted his eye doctor , no  intolerance to light   Respiratory: No wheezing , no  difficulty breathing. No cough , no mucus production  Cardiovascular: No CP, no leg swelling , no  Palpitations  GI: no nausea, no vomiting, no diarrhea , no  abdominal pain.  No blood in the stools. No dysphagia, no odynophagia. Occasionally unable to control  BMs, sees small amounts of stools. Happened 3 times over the last year. Denies any rectal itching, pain or bleeding    Endocrine: No polyphagia, no polyuria , no polydipsia  GU: No dysuria, gross hematuria, difficulty urinating. No urinary urgency, no frequency.  Musculoskeletal: No joint swellings or unusual aches or pains  Skin: No change in the color of the skin, palor. Sees dermatology regularly  Allergic, immunologic: No environmental allergies , no  food allergies  Neurological: No dizziness no  syncope. No headaches. No diplopia, no slurred, no slurred speech, no motor deficits, no facial  Numbness  Hematological: No enlarged lymph nodes, no easy bruising , no unusual bleedings  Psychiatry: No suicidal ideas, no hallucinations, no beavior problems, no confusion.  No unusual/severe anxiety, no depression   Past Medical History:  Diagnosis Date  . Anal fissure   . Asthma, chronic 06/16/2007   Qualifier: Diagnosis of  By: Linna Darner MD, Gwyndolyn Saxon   Onset:as child Triggers (environmental, infectious, allergic): all, mainly  environmental triggers Rescue inhaler [id number] Maintenance medications/ response:Singulair,Qvar Smoking history:never Family history pulmonary disease: no    . Basal cell carcinoma of chest wall 2016   1.8 cm, treated with electrodessication and currettage  . Benign paroxysmal positional vertigo 11/19/2012   Diagnosed at [facility], [facility]  Physical therapy appointment pending   . BPH (benign prostatic hyperplasia)    With urinary obstruction  . Coronary artery disease    a. 02/2012 Cath/PCI: LM 10, LAD min irregs, LCX large, OM1 sm, 40 ost, OM2 95p (5.0x16 Veriflex & 4.5x12 Veriflex BMS'), RCA 30p, 64m, 30d, PDA/PLA min irregs, EF 65%  . Diverticulosis   . Erectile dysfunction due to arterial insufficiency   . Fundic gland polyps of stomach, benign 2010  . GERD (gastroesophageal reflux disease)   . History of elevated PSA   . Hyperlipidemia   . Hypertension   . Nocturia   . Perennial allergic rhinitis   . Peyronie's disease   . Skin cancer    Basal and squamous cell cancers, greater than 20  . Stroke ([place]) 2016  . Tubular adenoma of colon 2010    Past Surgical History:  Procedure Laterality Date  . COLONOSCOPY  2003, 2011   negative  . CORONARY ANGIOPLASTY WITH STENT PLACEMENT  02/06/2012   OM2  bare metal   . EPIDURAL BLOCK INJECTION  04-2015   Dr Brien Few  . epidural steroids  2007, 2009   X 2 @ cervical &, lumbar)  . HERNIA REPAIR    . INTRAVASCULAR ULTRASOUND  02/06/2012   Procedure: INTRAVASCULAR ULTRASOUND;  Surgeon: [name], MD;  Location: [facility] CATH LAB;  Service: Cardiovascular;;  . LEFT HEART CATHETERIZATION WITH CORONARY ANGIOGRAM N/A 02/06/2012   Procedure: LEFT HEART CATHETERIZATION WITH CORONARY ANGIOGRAM;  Surgeon: [name], MD;  Location: [facility] CATH LAB;  Service: Cardiovascular;  Laterality: N/A;  . PERCUTANEOUS CORONARY STENT INTERVENTION (PCI-S)  02/06/2012   Procedure: PERCUTANEOUS CORONARY STENT INTERVENTION (PCI-S);  Surgeon:  [name], MD;  Location: [facility] CATH LAB;  Service: Cardiovascular;;  . septoplasty    . TONSILLECTOMY AND ADENOIDECTOMY    . TRIGGER FINGER RELEASE    . UPPER GASTROINTESTINAL ENDOSCOPY  2011   gastric polyps  . VASECTOMY      Social History   Social History  . Marital status: Married    Spouse name: N/A  . Number of children: 3  . Years of education: N/A   Occupational History  . 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 Smoking status: Never Smoker  . Smokeless tobacco: Never Used  . Alcohol use 0.0 oz/week     Comment: Wine occasionally  . Drug use: No  . Sexual activity: Yes    Partners: Female   Other Topics Concern  . Not on file   Social History Narrative   3 daughters, 15 g-children   lifes w/ wife in [place]     Family History  Problem Relation Age of Onset  . Heart attack Father 53    died @ 75  . Heart disease Mother   . Breast cancer Mother   . Esophageal cancer Mother     Barrett's  . Heart attack Maternal Grandmother     after 65  . Dementia Maternal Grandmother     CVA  . Breast cancer Maternal Grandmother   . Diabetes Maternal Grandmother   . Stroke Maternal Grandmother     in 24s  . Heart attack Paternal Uncle 14  . Breast cancer Maternal Aunt   . Colon cancer Neg Hx   . Prostate cancer Neg Hx   . Colon polyps Neg Hx      Allergies as of 04/29/2016      Reactions   Aspirin    Angioedema in 1980s Medi Alert bracelet  is recommended   Nifedipine Rash   edema      Medication List       Accurate as of 04/29/16  6:00 PM. Always use your most recent med list.          albuterol 108 (90 Base) MCG/ACT inhaler Commonly known as:  PROVENTIL HFA;VENTOLIN HFA Inhale 2 puffs into the lungs as needed. For wheezing   beclomethasone 80 MCG/ACT inhaler Commonly known as:  QVAR Inhale 1 puff into the lungs 2 (two) times daily.   clopidogrel 75 MG tablet Commonly known as:  PLAVIX Take 1 tablet (75  mg total) by mouth daily.   [facility] ALLERGY PO Take 1 tablet by mouth daily.   famotidine 20 MG tablet Commonly known as:  PEPCID Take 1 tablet (20 mg total) by mouth daily.   fluorouracil 5 % cream Commonly known as:  EFUDEX Apply 1 application topically daily. Reported on 09/10/2015   losartan-hydrochlorothiazide 50-12.5 MG tablet Commonly known as:  HYZAAR Take 1 tablet by mouth daily.   metoprolol tartrate 25 MG tablet Commonly known as:  LOPRESSOR Take 0.5 tablets (12.5 mg total) by mouth 2 (two) times daily.   mometasone 50 MCG/ACT nasal spray Commonly known as:  NASONEX  PLACE 2 SPRAYS INTO THE NOSE DAILY.   montelukast 10 MG tablet Commonly known as:  SINGULAIR TAKE 1 TABLET (10 MG TOTAL) BY MOUTH AT BEDTIME.   nitroGLYCERIN 0.4 MG SL tablet Commonly known as:  NITROSTAT Place 1 tablet (0.4 mg total) under the tongue every 5 (five) minutes as needed for chest pain.   rosuvastatin 40 MG tablet Commonly known as:  CRESTOR Take 1 tablet (40 mg total) by mouth daily.   tadalafil 5 MG tablet Commonly known as:  CIALIS Take 5 mg by mouth daily as needed for erectile dysfunction.          Objective:   Physical Exam BP 128/80 (BP Location: Left Arm, Patient Position: Sitting, Cuff Size: Normal)   Pulse (!) 59   Temp 97.9 F (36.6 C) (Oral)   Resp 14   Ht 5\' 10"  (1.778 m)   Wt 223 lb 8 oz (101.4 kg)   SpO2 97%   BMI 32.07 kg/m   General:   Well developed, well nourished . NAD.  Neck: No  thyromegaly  HEENT:  Normocephalic . Face symmetric, atraumatic Lungs:  CTA B Normal respiratory effort, no intercostal retractions, no accessory muscle use. Heart: RRR,  no murmur.  No pretibial edema bilaterally  Abdomen:  Not distended, soft, non-tender. No rebound or rigidity.   Skin: Exposed areas without rash. Not pale. Not jaundice Neurologic:  alert & oriented X3.  Speech normal, gait appropriate for age and unassisted Strength symmetric and appropriate for  age.  Psych: Cognition and judgment appear intact.  Cooperative with normal attention span and concentration.  Behavior appropriate. No anxious or depressed appearing.    Assessment & Plan:   Assessment  Prediabetes HTN Hyperlipidemia CV: Dr Angelena Form --CAD --Stroke, seen in a MRI Asthma- Dr Darcey Nora  MSK --DJD knees used to see Dr Len Childs 2016, local injections --Spinal stenosis (neck-back) - Dr Cyndy Freeze dx ~ 2008 via MRI neck, back, had local injections 2008; then 04-2015 GERD Benign positional vertigo-- did PT before GU: --Elevated PSA -- Dr Jeffie Pollock --Peyronie's  Disease Skin cancer : [facility] Dr Susie Cassette   PLAN: Prediabetes: Doing better with diet exercise, checking labs. HTN: Continue Hyzaar, Lopressor, check labs Hyperlipidemia: On Crestor, checking labs Asthma: Well-controlled, hardly ever uses albuterol Spinal stenosis: Under the care of Dr. Cyndy Freeze, he reports a very small amount of fecal incontinence sporadically. Recommend to discuss with neurosurgery Other medical problems is stable. RTC 6 months

## 2016-04-29 NOTE — Assessment & Plan Note (Signed)
Prediabetes: Doing better with diet exercise, checking labs. HTN: Continue Hyzaar, Lopressor, check labs Hyperlipidemia: On Crestor, checking labs Asthma: Well-controlled, hardly ever uses albuterol Spinal stenosis: Under the care of Dr. Cyndy Freeze, he reports a very small amount of fecal incontinence sporadically. Recommend to discuss with neurosurgery Other medical problems is stable. RTC 6 months

## 2016-04-29 NOTE — Progress Notes (Signed)
Pre visit review using our clinic review tool, if applicable. No additional management support is needed unless otherwise documented below in the visit note. 

## 2016-05-06 ENCOUNTER — Other Ambulatory Visit (INDEPENDENT_AMBULATORY_CARE_PROVIDER_SITE_OTHER): Payer: PPO

## 2016-05-06 DIAGNOSIS — R7989 Other specified abnormal findings of blood chemistry: Secondary | ICD-10-CM | POA: Diagnosis not present

## 2016-05-06 DIAGNOSIS — Z1159 Encounter for screening for other viral diseases: Secondary | ICD-10-CM | POA: Diagnosis not present

## 2016-05-06 DIAGNOSIS — Z0000 Encounter for general adult medical examination without abnormal findings: Secondary | ICD-10-CM

## 2016-05-06 LAB — BASIC METABOLIC PANEL
BUN: 20 mg/dL (ref 6–23)
CALCIUM: 9.3 mg/dL (ref 8.4–10.5)
CO2: 30 mEq/L (ref 19–32)
CREATININE: 0.99 mg/dL (ref 0.40–1.50)
Chloride: 106 mEq/L (ref 96–112)
GFR: 79.86 mL/min (ref >60.00)
Glucose, Bld: 105 mg/dL — ABNORMAL HIGH (ref 70–99)
Potassium: 4.4 mEq/L (ref 3.5–5.1)
Sodium: 140 mEq/L (ref 135–145)

## 2016-05-06 LAB — CBC WITH DIFFERENTIAL/PLATELET
BASOS PCT: 0.7 % (ref 0.0–3.0)
Basophils Absolute: 0 10*3/uL (ref 0.0–0.1)
EOS ABS: 0.1 10*3/uL (ref 0.0–0.7)
Eosinophils Relative: 2.2 % (ref 0.0–5.0)
HEMATOCRIT: 43 % (ref 39.0–52.0)
Hemoglobin: 14.6 g/dL (ref 13.0–17.0)
LYMPHS PCT: 34 % (ref 12.0–46.0)
Lymphs Abs: 1.7 10*3/uL (ref 0.7–4.0)
MCHC: 33.9 g/dL (ref 30.0–36.0)
MCV: 90.5 fl (ref 78.0–100.0)
MONO ABS: 0.5 10*3/uL (ref 0.1–1.0)
Monocytes Relative: 10.7 % (ref 3.0–12.0)
NEUTROS ABS: 2.6 10*3/uL (ref 1.4–7.7)
Neutrophils Relative %: 52.4 % (ref 43.0–77.0)
PLATELETS: 129 10*3/uL — AB (ref 150.0–400.0)
RBC: 4.75 Mil/uL (ref 4.22–5.81)
RDW: 13.1 % (ref 11.5–15.5)
WBC: 5.1 10*3/uL (ref 4.0–10.5)

## 2016-05-06 LAB — LIPID PANEL
Cholesterol: 114 mg/dL (ref 0–200)
HDL: 32.9 mg/dL — ABNORMAL LOW (ref >39.00)
NonHDL: 81.27
Total CHOL/HDL Ratio: 3
Triglycerides: 250 mg/dL — ABNORMAL HIGH (ref 0.0–149.0)
VLDL: 50 mg/dL — AB (ref 0.0–40.0)

## 2016-05-06 LAB — HEPATITIS C ANTIBODY: HCV AB: NEGATIVE

## 2016-05-06 LAB — HEMOGLOBIN A1C: Hgb A1c MFr Bld: 5.9 % (ref 4.6–6.5)

## 2016-05-06 LAB — AST: AST: 24 U/L (ref 0–37)

## 2016-05-06 LAB — ALT: ALT: 20 U/L (ref 0–53)

## 2016-05-06 LAB — LDL CHOLESTEROL, DIRECT: Direct LDL: 43 mg/dL

## 2016-06-23 ENCOUNTER — Other Ambulatory Visit: Payer: Self-pay | Admitting: Allergy

## 2016-07-03 ENCOUNTER — Encounter: Payer: Self-pay | Admitting: Pediatrics

## 2016-07-03 ENCOUNTER — Telehealth: Payer: Self-pay

## 2016-07-03 MED ORDER — PREDNISONE 10 MG PO TABS: ORAL_TABLET | ORAL | 0 refills | Status: DC

## 2016-07-03 MED ORDER — BECLOMETHASONE DIPROPIONATE 80 MCG/ACT IN AERS: INHALATION_SPRAY | RESPIRATORY_TRACT | 1 refills | Status: DC

## 2016-07-03 NOTE — Telephone Encounter (Addendum)
Per Dr. Shaune Leeks, prednisone 10mg  5 day dose pack and Qvar 80 x 1 called out to pharmacy. Left message for pt.

## 2016-07-03 NOTE — Telephone Encounter (Signed)
Dr. Shaune Leeks,  I was not aware that you are closed tomorrow as I tried to get an appointment. I am having much difficulty with my asthma the last three days. I fly home tonight from [place] so could not come today. I have been doubling and tripling   up on my Qvar twice a day in lieu of having prednisone. My RX for Qvar is expired and I have used the last inhaler.   Can you prescribe me any prednisone until Monday and/or please refill my Qvar . It will be a long weekend without either to help my breathing. [facility] [address] [place]. Please.  Thank you  George Alvarez

## 2016-07-07 ENCOUNTER — Other Ambulatory Visit: Payer: Self-pay

## 2016-07-07 ENCOUNTER — Encounter: Payer: Self-pay | Admitting: Pediatrics

## 2016-07-07 ENCOUNTER — Ambulatory Visit (INDEPENDENT_AMBULATORY_CARE_PROVIDER_SITE_OTHER): Payer: PPO | Admitting: Pediatrics

## 2016-07-07 VITALS — BP 142/88 | HR 77 | Temp 99.0°F | Resp 20

## 2016-07-07 DIAGNOSIS — J4540 Moderate persistent asthma, uncomplicated: Secondary | ICD-10-CM

## 2016-07-07 DIAGNOSIS — Z79899 Other long term (current) drug therapy: Secondary | ICD-10-CM

## 2016-07-07 DIAGNOSIS — I10 Essential (primary) hypertension: Secondary | ICD-10-CM | POA: Diagnosis not present

## 2016-07-07 DIAGNOSIS — J3089 Other allergic rhinitis: Secondary | ICD-10-CM | POA: Diagnosis not present

## 2016-07-07 DIAGNOSIS — K219 Gastro-esophageal reflux disease without esophagitis: Secondary | ICD-10-CM | POA: Diagnosis not present

## 2016-07-07 MED ORDER — FLUTICASONE PROPIONATE HFA 110 MCG/ACT IN AERO: INHALATION_SPRAY | RESPIRATORY_TRACT | 5 refills | Status: DC

## 2016-07-07 MED ORDER — MOMETASONE FUROATE 50 MCG/ACT NA SUSP: NASAL | 5 refills | Status: DC

## 2016-07-07 MED ORDER — MONTELUKAST SODIUM 10 MG PO TABS: ORAL_TABLET | ORAL | 5 refills | Status: DC

## 2016-07-07 MED ORDER — ALBUTEROL SULFATE HFA 108 (90 BASE) MCG/ACT IN AERS: INHALATION_SPRAY | RESPIRATORY_TRACT | 1 refills | Status: DC

## 2016-07-07 NOTE — Progress Notes (Signed)
[place] [address] Dept: [phone]  FOLLOW UP NOTE  Patient ID: George Alvarez, male    DOB: 1945-12-29  Age: 71 y.o. MRN: [id number] Date of Office Visit: 07/07/2016  Assessment  Chief Complaint: Asthma (coughing and wheezing )  HPI George Alvarez presents for follow-up of asthma and allergic rhinitis. He was started on prednisone. 4 days ago because of an exacerbation of asthma. Otherwise his asthma had been well controlled. His nasal symptoms are well controlled. His gastroesophageal reflux is well controlled. He has been on Qvar 80-one  puff twice a day , but we need to replace Qvar 80 with Flovent  Current medications in  addition to Qvar 80 are Ventolin 2 puffs every 4 hours if needed, montelukast 10 mg once a day, generic Nasonex 1 spray per nostril twice a day if needed, omeprazole 40 mg once a day metoprolol 25 mg once a day. His other medications are outlined in the chart     Drug Allergies:  Allergies  Allergen Reactions  . Aspirin     Angioedema in 1980s Medi Alert bracelet  is recommended  . Nifedipine Rash    edema    Physical Exam: BP (!) 142/88   Pulse 77   Temp 99 F (37.2 C) (Oral)   Resp 20    Physical Exam  Constitutional: He is oriented to person, place, and time. He appears well-developed and well-nourished.  HENT:  Eyes normal. Ears normal. Nose normal. Pharynx normal.  Neck: Neck supple.  Cardiovascular:  S1 and S2 normal no murmurs  Pulmonary/Chest:  Clear to percussion and auscultation  Lymphadenopathy:    He has no cervical adenopathy.  Neurological: He is alert and oriented to person, place, and time.  Psychiatric: He has a normal mood and affect. His behavior is normal. Judgment and thought content normal.  Vitals reviewed.   Diagnostics:  FVC 3.42 L FEV1 2.51 L. Predicted FVC 4.51 L predicted FEV1 3.34 L-this shows a mild reduction in the forced vital capacity but stable for him  Assessment and  Plan: 1. Moderate persistent asthma without complication   2. Gastroesophageal reflux disease without esophagitis   3. Other allergic rhinitis   4. Essential hypertension   5. Current use of beta blocker     Meds ordered this encounter  Medications  . montelukast (SINGULAIR) 10 MG tablet    Sig: One tablet once a day for coughing or wheezing    Dispense:  30 tablet    Refill:  5    Please keep rx on file. Pt will call when needed  . fluticasone (FLOVENT HFA) 110 MCG/ACT inhaler    Sig: One puff twice a day to prevent coughing and wheezing. Rinse, gargle and spit out after use    Dispense:  1 Inhaler    Refill:  5  . albuterol (PROVENTIL HFA;VENTOLIN HFA) 108 (90 Base) MCG/ACT inhaler    Sig: 2 puffs every 4 hours if needed for coughing or wheezing spells    Dispense:  1 Inhaler    Refill:  1  . mometasone (NASONEX) 50 MCG/ACT nasal spray    Sig: One spray in each nostril twice a day if needed for stuffy nose    Dispense:  17 g    Refill:  5    Keep rx on file, pt will call when needed    Patient Instructions  Flovent 110-1 puff twice a day to prevent coughing or wheezing Ventolin 2 puffs every  4 hours if needed for wheezing or coughing spells Montelukast  10 mg once a day for coughing or wheezing Nasonex 1 spray per nostril twice a day if needed for stuffy nose He should have a flu vaccination this fall Call me if he's not doing well on this treatment plan   Return in about 6 months (around 01/06/2017).    Thank you for the opportunity to care for this patient.  Please do not hesitate to contact me with questions.  Penne Lash, M.D.  [facility] [address] [place], [place] [address] [phone]

## 2016-07-07 NOTE — Telephone Encounter (Signed)
OK 

## 2016-07-07 NOTE — Patient Instructions (Signed)
Flovent 110-1 puff twice a day to prevent coughing or wheezing Ventolin 2 puffs every 4 hours if needed for wheezing or coughing spells Montelukast  10 mg once a day for coughing or wheezing Nasonex 1 spray per nostril twice a day if needed for stuffy nose He should have a flu vaccination this fall Call me if he's not doing well on this treatment plan

## 2016-07-24 DIAGNOSIS — M4722 Other spondylosis with radiculopathy, cervical region: Secondary | ICD-10-CM | POA: Diagnosis not present

## 2016-07-24 DIAGNOSIS — I10 Essential (primary) hypertension: Secondary | ICD-10-CM | POA: Diagnosis not present

## 2016-07-24 DIAGNOSIS — M4312 Spondylolisthesis, cervical region: Secondary | ICD-10-CM | POA: Diagnosis not present

## 2016-07-24 DIAGNOSIS — M48062 Spinal stenosis, lumbar region with neurogenic claudication: Secondary | ICD-10-CM | POA: Diagnosis not present

## 2016-08-04 ENCOUNTER — Other Ambulatory Visit: Payer: Self-pay | Admitting: Internal Medicine

## 2016-08-23 ENCOUNTER — Other Ambulatory Visit: Payer: Self-pay | Admitting: Internal Medicine

## 2016-08-27 ENCOUNTER — Encounter: Payer: Self-pay | Admitting: Cardiovascular Disease

## 2016-08-27 DIAGNOSIS — M4802 Spinal stenosis, cervical region: Secondary | ICD-10-CM | POA: Diagnosis not present

## 2016-08-27 DIAGNOSIS — M48062 Spinal stenosis, lumbar region with neurogenic claudication: Secondary | ICD-10-CM | POA: Diagnosis not present

## 2016-08-27 DIAGNOSIS — M5412 Radiculopathy, cervical region: Secondary | ICD-10-CM | POA: Diagnosis not present

## 2016-08-27 DIAGNOSIS — M5416 Radiculopathy, lumbar region: Secondary | ICD-10-CM | POA: Diagnosis not present

## 2016-09-02 ENCOUNTER — Telehealth: Payer: Self-pay | Admitting: *Deleted

## 2016-09-02 NOTE — Telephone Encounter (Signed)
Will fax this phone note to Dr. Lauris Poag office.  [phone]

## 2016-09-02 NOTE — Telephone Encounter (Signed)
Received fax in office from Dr. Brien Few [facility]) asking if pt can stop Plavix 7 days prior to [facility] Cervical and Lumbar.  Cervical appt is 09/16/16 and Lumbar appt is 09/30/16. Dr. Angelena Form is not in office this week to sign paperwork.  Will forward this phone note to him to see if OK to stop Plavix as requested.

## 2016-09-02 NOTE — Telephone Encounter (Signed)
OK to hold Plavix. There is also an email from the patient about this. Thanks, chris

## 2016-09-16 DIAGNOSIS — M5412 Radiculopathy, cervical region: Secondary | ICD-10-CM | POA: Diagnosis not present

## 2016-09-16 DIAGNOSIS — M4802 Spinal stenosis, cervical region: Secondary | ICD-10-CM | POA: Diagnosis not present

## 2016-09-18 DIAGNOSIS — L570 Actinic keratosis: Secondary | ICD-10-CM | POA: Diagnosis not present

## 2016-09-18 DIAGNOSIS — Z08 Encounter for follow-up examination after completed treatment for malignant neoplasm: Secondary | ICD-10-CM | POA: Diagnosis not present

## 2016-09-18 DIAGNOSIS — Z85828 Personal history of other malignant neoplasm of skin: Secondary | ICD-10-CM | POA: Diagnosis not present

## 2016-09-30 DIAGNOSIS — M48062 Spinal stenosis, lumbar region with neurogenic claudication: Secondary | ICD-10-CM | POA: Diagnosis not present

## 2016-10-21 ENCOUNTER — Ambulatory Visit: Payer: PPO | Admitting: Internal Medicine

## 2016-10-27 DIAGNOSIS — R972 Elevated prostate specific antigen [PSA]: Secondary | ICD-10-CM | POA: Diagnosis not present

## 2016-10-27 LAB — PSA: PSA: 1.94

## 2016-11-04 ENCOUNTER — Telehealth: Payer: Self-pay | Admitting: *Deleted

## 2016-11-04 ENCOUNTER — Encounter: Payer: Self-pay | Admitting: Pediatrics

## 2016-11-04 NOTE — Telephone Encounter (Signed)
Dr. Shaune Leeks,  We have been in [place] babysitting our grandchildren and won't return until next week. Three days ago I started having quick onset asthma issues. I have doubled up my dosages on my "QVar"to try and help. Over the years you usually do a breathing treatment/ZPak/ and prednisone and send me on my way. Is it possible to get a prescription of [facility]nis[facility] and follow up with your office when I return? If so [facility] is [facility] [address] [place] [address]. [phone].   Thank you   George Alvarez  [email or web address]  [phone]

## 2016-11-05 MED ORDER — PREDNISONE 10 MG PO TABS: ORAL_TABLET | ORAL | 0 refills | Status: DC

## 2016-11-05 MED ORDER — AZITHROMYCIN 250 MG PO TABS: ORAL_TABLET | ORAL | 0 refills | Status: DC

## 2016-11-05 NOTE — Telephone Encounter (Signed)
Call in a  5 day Zithromax pack and prednisone 10 mg tablets-take 2 tablets twice a day for 3 days, 2 tablets on the fourth day, one tablet on the fifth day

## 2016-11-05 NOTE — Telephone Encounter (Signed)
Meds sent. Pt notified through mychart.

## 2016-11-17 ENCOUNTER — Encounter: Payer: Self-pay | Admitting: Internal Medicine

## 2016-11-19 DIAGNOSIS — R972 Elevated prostate specific antigen [PSA]: Secondary | ICD-10-CM | POA: Diagnosis not present

## 2016-11-19 DIAGNOSIS — N401 Enlarged prostate with lower urinary tract symptoms: Secondary | ICD-10-CM | POA: Diagnosis not present

## 2016-11-19 DIAGNOSIS — R351 Nocturia: Secondary | ICD-10-CM | POA: Diagnosis not present

## 2016-11-19 DIAGNOSIS — N5201 Erectile dysfunction due to arterial insufficiency: Secondary | ICD-10-CM | POA: Diagnosis not present

## 2016-11-21 ENCOUNTER — Encounter: Payer: Self-pay | Admitting: Internal Medicine

## 2016-12-17 ENCOUNTER — Other Ambulatory Visit: Payer: Self-pay | Admitting: Pediatrics

## 2016-12-17 MED ORDER — MONTELUKAST SODIUM 10 MG PO TABS: ORAL_TABLET | ORAL | 5 refills | Status: DC

## 2016-12-17 NOTE — Telephone Encounter (Signed)
RF for Singulair x 1 with [facility]

## 2016-12-18 ENCOUNTER — Other Ambulatory Visit: Payer: Self-pay | Admitting: Internal Medicine

## 2016-12-23 ENCOUNTER — Other Ambulatory Visit: Payer: Self-pay

## 2016-12-23 MED ORDER — ROSUVASTATIN CALCIUM 40 MG PO TABS: 40.0000 mg | ORAL_TABLET | Freq: Every day | ORAL | 0 refills | Status: DC

## 2016-12-23 MED ORDER — LOSARTAN POTASSIUM-HCTZ 50-12.5 MG PO TABS: 1.0000 | ORAL_TABLET | Freq: Every day | ORAL | 0 refills | Status: DC

## 2017-01-06 ENCOUNTER — Other Ambulatory Visit: Payer: Self-pay | Admitting: *Deleted

## 2017-01-06 MED ORDER — CLOPIDOGREL BISULFATE 75 MG PO TABS: 75.0000 mg | ORAL_TABLET | Freq: Every day | ORAL | 0 refills | Status: DC

## 2017-01-09 ENCOUNTER — Other Ambulatory Visit: Payer: Self-pay

## 2017-01-09 ENCOUNTER — Other Ambulatory Visit: Payer: Self-pay | Admitting: Cardiovascular Disease

## 2017-01-09 MED ORDER — FAMOTIDINE 20 MG PO TABS: 20.0000 mg | ORAL_TABLET | Freq: Every day | ORAL | 0 refills | Status: DC

## 2017-01-09 MED ORDER — MONTELUKAST SODIUM 10 MG PO TABS: ORAL_TABLET | ORAL | 0 refills | Status: DC

## 2017-01-09 NOTE — Telephone Encounter (Signed)
Medication Detail    Disp Refills Start End   clopidogrel (PLAVIX) 75 MG tablet 30 tablet 0 01/06/2017    Sig - Route: Take 1 tablet (75 mg total) by mouth daily. Please keep upcoming appointment for future refills - Oral   Sent to pharmacy as: clopidogrel (PLAVIX) 75 MG tablet   E-Prescribing Status: Receipt confirmed by pharmacy (01/06/2017 11:48 AM EDT)   [facility]   [id number] [place], [place] - [place]. AT [address]

## 2017-01-19 ENCOUNTER — Ambulatory Visit (INDEPENDENT_AMBULATORY_CARE_PROVIDER_SITE_OTHER): Payer: PPO | Admitting: Cardiovascular Disease

## 2017-01-19 ENCOUNTER — Encounter: Payer: Self-pay | Admitting: Cardiovascular Disease

## 2017-01-19 VITALS — BP 124/80 | HR 63 | Ht 70.0 in | Wt 220.6 lb

## 2017-01-19 DIAGNOSIS — E7800 Pure hypercholesterolemia, unspecified: Secondary | ICD-10-CM | POA: Diagnosis not present

## 2017-01-19 DIAGNOSIS — I10 Essential (primary) hypertension: Secondary | ICD-10-CM

## 2017-01-19 DIAGNOSIS — I2510 Atherosclerotic heart disease of native coronary artery without angina pectoris: Secondary | ICD-10-CM

## 2017-01-19 NOTE — Progress Notes (Signed)
\   Chief Complaint  Patient presents with  . Follow-up    CAD    History of Present Illness: 71 yo male with history of HTN, HLD, CAD who is here today for follow up. He underwent a stress test on 02/05/12 which was suggestive of ischemia. Cardiac cath on 02/06/12 with 95% proximal OM2 lesion and a 60% mid RCA lesion. EF was 65%. His OM2 was very large in caliber and was treated with bare-metal stents x 2. Medical management of RCA stenosis which was felt to be moderate. He has been on Plavix. He has chronic dizziness and is felt to have vertigo. Normal stress myoview December 2015. He is limited by chronic back pain due to spinal stenosis.   He is here today for follow up. The patient denies any chest pain, dyspnea, palpitations, lower extremity edema, orthopnea, PND, dizziness, near syncope or syncope. He continues to have back pain.   Primary Care Physician: Colon Branch, MD  Past Medical History:  Diagnosis Date  . Anal fissure   . Asthma, chronic 06/16/2007   Qualifier: Diagnosis of  By: Linna Darner MD, Gwyndolyn Saxon   Onset:as child Triggers (environmental, infectious, allergic): all, mainly environmental triggers Rescue inhaler GEX:[id number] Maintenance medications/ response:Singulair,Qvar Smoking history:never Family history pulmonary disease: no    . Basal cell carcinoma of chest wall 2016   1.8 cm, treated with electrodessication and currettage  . Benign paroxysmal positional vertigo 11/19/2012   Diagnosed at [facility]  Physical therapy appointment pending   . BPH (benign prostatic hyperplasia)    With urinary obstruction  . Coronary artery disease    a. 02/2012 Cath/PCI: LM 10, LAD min irregs, LCX large, OM1 sm, 40 ost, OM2 95p (5.0x16 Veriflex & 4.5x12 Veriflex BMS'), RCA 30p, 71m, 30d, PDA/PLA min irregs, EF 65%  . Diverticulosis   . Erectile dysfunction due to arterial insufficiency   . Fundic gland polyps of stomach, benign 2010  . GERD (gastroesophageal reflux disease)    . History of elevated PSA   . Hyperlipidemia   . Hypertension   . Nocturia   . Perennial allergic rhinitis   . Peyronie's disease   . Skin cancer    Basal and squamous cell cancers, greater than 20  . Stroke ([place]) 2016  . Tubular adenoma of colon 2010    Past Surgical History:  Procedure Laterality Date  . COLONOSCOPY  2003, 2011   negative  . CORONARY ANGIOPLASTY WITH STENT PLACEMENT  02/06/2012   OM2  bare metal   . EPIDURAL BLOCK INJECTION  04-2015   Dr Brien Few  . epidural steroids  2007, 2009   X 2 @ cervical &, lumbar)  . HERNIA REPAIR    . INTRAVASCULAR ULTRASOUND  02/06/2012   Procedure: INTRAVASCULAR ULTRASOUND;  Surgeon: [name], MD;  Location: [facility] CATH LAB;  Service: Cardiovascular;;  . LEFT HEART CATHETERIZATION WITH CORONARY ANGIOGRAM N/A 02/06/2012   Procedure: LEFT HEART CATHETERIZATION WITH CORONARY ANGIOGRAM;  Surgeon: [name], MD;  Location: [facility] CATH LAB;  Service: Cardiovascular;  Laterality: N/A;  . PERCUTANEOUS CORONARY STENT INTERVENTION (PCI-S)  02/06/2012   Procedure: PERCUTANEOUS CORONARY STENT INTERVENTION (PCI-S);  Surgeon: [name], MD;  Location: [facility] CATH LAB;  Service: Cardiovascular;;  . septoplasty    . TONSILLECTOMY AND ADENOIDECTOMY    . TRIGGER FINGER RELEASE    . UPPER GASTROINTESTINAL ENDOSCOPY  2011   gastric polyps  . VASECTOMY      Current Outpatient Prescriptions  Medication Sig Dispense Refill  . albuterol (PROVENTIL HFA;VENTOLIN HFA) 108 (90 Base) MCG/ACT inhaler 2 puffs every 4 hours if needed for coughing or wheezing spells 1 Inhaler 1  . beclomethasone (QVAR) 80 MCG/ACT inhaler One puff twice a day to prevent coughing or wheezing. Rinse, gargle and spit out after use 1 Inhaler 1  . Chlorpheniramine Maleate ([employer] ALLERGY PO) Take 1 tablet by mouth daily.     . clopidogrel (PLAVIX) 75 MG tablet Take 1 tablet (75 mg total) by mouth daily. Please keep upcoming appointment for future refills 30  tablet 0  . famotidine (PEPCID) 20 MG tablet Take 1 tablet (20 mg total) by mouth daily. 30 tablet 0  . fluticasone (FLOVENT HFA) 110 MCG/ACT inhaler One puff twice a day to prevent coughing and wheezing. Rinse, gargle and spit out after use 1 Inhaler 5  . losartan-hydrochlorothiazide (HYZAAR) 50-12.5 MG tablet Take 1 tablet by mouth daily. 30 tablet 0  . metoprolol tartrate (LOPRESSOR) 25 MG tablet Take 0.5 tablets (12.5 mg total) by mouth 2 (two) times daily. 30 tablet 0  . mometasone (NASONEX) 50 MCG/ACT nasal spray One spray in each nostril twice a day if needed for stuffy nose 17 g 5  . montelukast (SINGULAIR) 10 MG tablet ONE TABLET ONCE A DAY FOR COUGHING OR WHEEZING 90 tablet 0  . nitroGLYCERIN (NITROSTAT) 0.4 MG SL tablet Place 1 tablet (0.4 mg total) under the tongue every 5 (five) minutes as needed for chest pain. 25 tablet 6  . predniSONE (DELTASONE) 10 MG tablet Take 2 tablets twice a day for 3 days, 2 tablets on day 4, then 1 tablet on day 5 15 tablet 0  . rosuvastatin (CRESTOR) 40 MG tablet Take 1 tablet (40 mg total) by mouth daily. 30 tablet 0  . tadalafil (CIALIS) 5 MG tablet Take 5 mg by mouth daily as needed for erectile dysfunction.    . fluorouracil (EFUDEX) 5 % cream Apply 1 application topically daily. Reported on 09/10/2015     No current facility-administered medications for this visit.     Allergies  Allergen Reactions  . Aspirin     Angioedema in 1980s Medi Alert bracelet  is recommended  . Nifedipine Rash    edema    Social History   Social History  . Marital status: Married    Spouse name: N/A  . Number of children: 3  . Years of education: N/A   Occupational History  . 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 Smoking status: Never Smoker  . Smokeless tobacco: Never Used  . Alcohol use 0.0 oz/week     Comment: Wine occasionally  . Drug use: No  . Sexual activity: Yes    Partners: Female   Other Topics Concern  . Not on  file   Social History Narrative   3 daughters, 35 g-children   lifes w/ wife in [place]    Family History  Problem Relation Age of Onset  . Heart attack Father 82       died @ 55  . Heart disease Mother   . Breast cancer Mother   . Esophageal cancer Mother        Barrett's  . Heart attack Maternal Grandmother        after 65  . Dementia Maternal Grandmother        CVA  . Breast cancer Maternal Grandmother   . Diabetes Maternal Grandmother   . Stroke Maternal Grandmother  in 103s  . Heart attack Paternal Uncle 42  . Breast cancer Maternal Aunt   . Colon cancer Neg Hx   . Prostate cancer Neg Hx   . Colon polyps Neg Hx     Review of Systems:  As stated in the HPI and otherwise negative.   BP 124/80   Pulse 63   Ht 5\' 10"  (1.778 m)   Wt 220 lb 9.6 oz (100.1 kg)   SpO2 95%   BMI 31.65 kg/m   Physical Examination:  General: Well developed, well nourished, NAD  HEENT: OP clear, mucus membranes moist  SKIN: warm, dry. No rashes. Neuro: No focal deficits  Musculoskeletal: Muscle strength 5/5 all ext  Psychiatric: Mood and affect normal  Neck: No JVD, no carotid bruits, no thyromegaly, no lymphadenopathy.  Lungs:Clear bilaterally, no wheezes, rhonci, crackles Cardiovascular: Regular rate and rhythm. No murmurs, gallops or rubs. Abdomen:Soft. Bowel sounds present. Non-tender.  Extremities: No lower extremity edema. Pulses are 2 + in the bilateral DP/PT.  Cardiac cath 02/06/12: Left main: 10% distal stenosis.  Left Anterior Descending Artery: Large caliber vessel that courses to the apex with mild luminal irregularities proximal and distal.  Circumflex Artery: Very large caliber vessel. The first OM is small and has an ostial 40% stenosis. The second OM is very large (5.26mm) and has a 95% stenosis in the proximal portion of the vessel.  Right Coronary Artery: Large, dominant artery with 30% proximal stenosis, 60% mid stenosis, diffuse 30% distal disease. The PDA  and PLA are patent with mild plaque disease.  Left Ventricular Angiogram: LVEF=65%  Impression:  1. Double vessel CAD  2. Unstable angina  3. Normal LV systolic function  4. Successful PTCA/bare metal stent x 1 OM2.   EKG:  EKG is ordered today. The ekg ordered today demonstrates NSR, rate 62 bpm. LAFB  Recent Labs: 05/06/2016: ALT 20; BUN 20; Creatinine, Ser 0.99; Hemoglobin 14.6; Platelets 129.0; Potassium 4.4; Sodium 140   Lipid Panel    Component Value Date/Time   CHOL 114 05/06/2016 0815   TRIG 250.0 (H) 05/06/2016 0815   HDL 32.90 (L) 05/06/2016 0815   CHOLHDL 3 05/06/2016 0815   VLDL 50.0 (H) 05/06/2016 0815   LDLCALC 50 04/24/2015 1558   LDLDIRECT 43.0 05/06/2016 0815     Wt Readings from Last 3 Encounters:  01/19/17 220 lb 9.6 oz (100.1 kg)  04/29/16 223 lb 8 oz (101.4 kg)  01/04/16 222 lb 12.8 oz (101.1 kg)     Other studies Reviewed: Additional studies/ records that were reviewed today include: . Review of the above records demonstrates:    Assessment and Plan:   1. CAD without angina: He has no chest pain suggestive of angina. He is intolerant of ASA, Will continue Plavix, beta blocker and statin.   2. HTN: BP is controlled. No changes today.   3. Hyperlipidemia: Lipids controlled. Continue statin.   Current medicines are reviewed at length with the patient today.  The patient does not have concerns regarding medicines.  The following changes have been made:  no change  Labs/ tests ordered today include:   Orders Placed This Encounter  Procedures  . EKG 12-Lead    Disposition:   FU with me in 12  months  Signed, Lauree Chandler, MD 01/19/2017 3:00 PM    [address], [place], [place]  [address] Phone: [phone]; Fax: [phone]

## 2017-01-19 NOTE — Patient Instructions (Signed)

## 2017-01-20 ENCOUNTER — Ambulatory Visit (INDEPENDENT_AMBULATORY_CARE_PROVIDER_SITE_OTHER): Payer: PPO | Admitting: Podiatry

## 2017-01-20 ENCOUNTER — Encounter: Payer: Self-pay | Admitting: Podiatry

## 2017-01-20 VITALS — BP 132/80 | HR 49 | Resp 18

## 2017-01-20 DIAGNOSIS — B351 Tinea unguium: Secondary | ICD-10-CM | POA: Diagnosis not present

## 2017-01-20 DIAGNOSIS — L603 Nail dystrophy: Secondary | ICD-10-CM | POA: Diagnosis not present

## 2017-01-20 DIAGNOSIS — B070 Plantar wart: Secondary | ICD-10-CM | POA: Diagnosis not present

## 2017-01-20 NOTE — Progress Notes (Signed)
Subjective:    Patient ID: George Alvarez, male    DOB: April 19, 1945, 71 y.o.   MRN: [id number]  HPI  71 year old male presents the office today for concerns of his right big toenail growing sideways. He states he presented the nail corners removed on both sides of both big toenail several years ago and left-sided well but the right side started to grow crooked. He also states the nails thickened and yellow and he had this on his other toenails this did grow out and he no longer has the discoloration the nails that he used to. He has a states he said warts to his right fifth toe for several years. He hasn't some over-the-counter treatment and he has could have the area frozen before we did not have this done. He states that he has been "slack" in regards to treatment for this. Denies any pain. Denies any other issues today. He has no other concerns.  Review of Systems  All other systems reviewed and are negative.  Past Medical History:  Diagnosis Date  . Anal fissure   . Asthma, chronic 06/16/2007   Qualifier: Diagnosis of  By: Linna Darner MD, Gwyndolyn Saxon   Onset:as child Triggers (environmental, infectious, allergic): all, mainly environmental triggers Rescue inhaler VEL:FYBOFB Maintenance medications/ response:Singulair,Qvar Smoking history:never Family history pulmonary disease: no    . Basal cell carcinoma of chest wall 2016   1.8 cm, treated with electrodessication and currettage  . Benign paroxysmal positional vertigo 11/19/2012   Diagnosed at [facility]  Physical therapy appointment pending   . BPH (benign prostatic hyperplasia)    With urinary obstruction  . Coronary artery disease    a. 02/2012 Cath/PCI: LM 10, LAD min irregs, LCX large, OM1 sm, 40 ost, OM2 95p (5.0x16 Veriflex & 4.5x12 Veriflex BMS'), RCA 30p, 33m, 30d, PDA/PLA min irregs, EF 65%  . Diverticulosis   . Erectile dysfunction due to arterial insufficiency   . Fundic gland polyps of stomach, benign 2010  . GERD  (gastroesophageal reflux disease)   . History of elevated PSA   . Hyperlipidemia   . Hypertension   . Nocturia   . Perennial allergic rhinitis   . Peyronie's disease   . Skin cancer    Basal and squamous cell cancers, greater than 20  . Stroke ([place]) 2016  . Tubular adenoma of colon 2010    Past Surgical History:  Procedure Laterality Date  . COLONOSCOPY  2003, 2011   negative  . CORONARY ANGIOPLASTY WITH STENT PLACEMENT  02/06/2012   OM2  bare metal   . EPIDURAL BLOCK INJECTION  04-2015   Dr Brien Few  . epidural steroids  2007, 2009   X 2 @ cervical &, lumbar)  . HERNIA REPAIR    . INTRAVASCULAR ULTRASOUND  02/06/2012   Procedure: INTRAVASCULAR ULTRASOUND;  Surgeon: [name], MD;  Location: [facility] CATH LAB;  Service: Cardiovascular;;  . LEFT HEART CATHETERIZATION WITH CORONARY ANGIOGRAM N/A 02/06/2012   Procedure: LEFT HEART CATHETERIZATION WITH CORONARY ANGIOGRAM;  Surgeon: [name], MD;  Location: [facility] CATH LAB;  Service: Cardiovascular;  Laterality: N/A;  . PERCUTANEOUS CORONARY STENT INTERVENTION (PCI-S)  02/06/2012   Procedure: PERCUTANEOUS CORONARY STENT INTERVENTION (PCI-S);  Surgeon: [name], MD;  Location: [facility] CATH LAB;  Service: Cardiovascular;;  . septoplasty    . TONSILLECTOMY AND ADENOIDECTOMY    . TRIGGER FINGER RELEASE    . UPPER GASTROINTESTINAL ENDOSCOPY  2011   gastric polyps  . VASECTOMY  Current Outpatient Prescriptions:  .  albuterol (PROVENTIL HFA;VENTOLIN HFA) 108 (90 Base) MCG/ACT inhaler, 2 puffs every 4 hours if needed for coughing or wheezing spells, Disp: 1 Inhaler, Rfl: 1 .  beclomethasone (QVAR) 80 MCG/ACT inhaler, One puff twice a day to prevent coughing or wheezing. Rinse, gargle and spit out after use, Disp: 1 Inhaler, Rfl: 1 .  Chlorpheniramine Maleate ([employer] ALLERGY PO), Take 1 tablet by mouth daily. , Disp: , Rfl:  .  clopidogrel (PLAVIX) 75 MG tablet, Take 1 tablet (75 mg total) by mouth daily. Please  keep upcoming appointment for future refills, Disp: 30 tablet, Rfl: 0 .  famotidine (PEPCID) 20 MG tablet, Take 1 tablet (20 mg total) by mouth daily., Disp: 30 tablet, Rfl: 0 .  fluorouracil (EFUDEX) 5 % cream, Apply 1 application topically daily. Reported on 09/10/2015, Disp: , Rfl:  .  fluticasone (FLOVENT HFA) 110 MCG/ACT inhaler, One puff twice a day to prevent coughing and wheezing. Rinse, gargle and spit out after use, Disp: 1 Inhaler, Rfl: 5 .  losartan-hydrochlorothiazide (HYZAAR) 50-12.5 MG tablet, Take 1 tablet by mouth daily., Disp: 30 tablet, Rfl: 0 .  metoprolol tartrate (LOPRESSOR) 25 MG tablet, Take 0.5 tablets (12.5 mg total) by mouth 2 (two) times daily., Disp: 30 tablet, Rfl: 0 .  mometasone (NASONEX) 50 MCG/ACT nasal spray, One spray in each nostril twice a day if needed for stuffy nose, Disp: 17 g, Rfl: 5 .  montelukast (SINGULAIR) 10 MG tablet, ONE TABLET ONCE A DAY FOR COUGHING OR WHEEZING, Disp: 90 tablet, Rfl: 0 .  nitroGLYCERIN (NITROSTAT) 0.4 MG SL tablet, Place 1 tablet (0.4 mg total) under the tongue every 5 (five) minutes as needed for chest pain., Disp: 25 tablet, Rfl: 6 .  predniSONE (DELTASONE) 10 MG tablet, Take 2 tablets twice a day for 3 days, 2 tablets on day 4, then 1 tablet on day 5, Disp: 15 tablet, Rfl: 0 .  rosuvastatin (CRESTOR) 40 MG tablet, Take 1 tablet (40 mg total) by mouth daily., Disp: 30 tablet, Rfl: 0 .  tadalafil (CIALIS) 5 MG tablet, Take 5 mg by mouth daily as needed for erectile dysfunction., Disp: , Rfl:   Allergies  Allergen Reactions  . Aspirin     Angioedema in 1980s Medi Alert bracelet  is recommended  . Nifedipine Rash    edema    Social History   Social History  . Marital status: Married    Spouse name: N/A  . Number of children: 3  . Years of education: N/A   Occupational History  . 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 Smoking status: Never Smoker  . Smokeless tobacco: Never Used  . Alcohol use  0.0 oz/week     Comment: Wine occasionally  . Drug use: No  . Sexual activity: Yes    Partners: Female   Other Topics Concern  . Not on file   Social History Narrative   3 daughters, 98 g-children   lifes w/ wife in [place]         Objective:   Physical Exam General: AAO x3, NAD  Dermatological: Multiple areas of hyperkeratotic tissue with evidence of verruca to the right fifth toe but there is no pain or underlying ulceration. The right hallux toenail. 3 dystrophic and discolored yellow-brown discoloration and there is onychodystrophy the nail as there is a nail growing laterally as he gets elongated. The nails firmly adhered to the underlying nail bed. There is  no underlying ulceration is no drainage or pus coming from the toenail there is no pain or redness or swelling.  Vascular: Dorsalis Pedis artery and Posterior Tibial artery pedal pulses are 2/4 bilateral with immedate capillary fill time. There is no pain with calf compression, swelling, warmth, erythema.   Neruologic: Grossly intact via light touch bilateral. Protective threshold with Semmes Wienstein monofilament intact to all pedal sites bilateral.   Musculoskeletal: No gross boney pedal deformities bilateral. No pain, crepitus, or limitation noted with foot and ankle range of motion bilateral. Muscular strength 5/5 in all groups tested bilateral.  Gait: Unassisted, Nonantalgic.      Assessment & Plan:  71 year old male with chronic verruca right fifth toe, onychodystrophy, onychomycosis right hallux -Treatment options discussed including all alternatives, risks, and complications -Etiology of symptoms were discussed -Regards the ward I ordered a warts treatment through compound pharmacy, [facility].  -Also debrided the right hallux toenail any Locations of bleeding to patient comfort. Discussed nail avulsion but he wishes to hold off on this.  -Daily foot inspection  Celesta Gentile, DPM

## 2017-01-21 ENCOUNTER — Telehealth: Payer: Self-pay | Admitting: Internal Medicine

## 2017-01-21 NOTE — Telephone Encounter (Signed)
Pt has been scheduled w/pcp tomorrow 01/22/17 at 9:30

## 2017-01-21 NOTE — Telephone Encounter (Signed)
Shiqu[facility]- received refill request for losartan-hctz, refilled for 30 days only, Pt overdue for appt w/ PCP. Please schedule at Pt's convenience but inform him we won't be able to refill any further until seen. Thank you.

## 2017-01-21 NOTE — Telephone Encounter (Signed)
Noted, thank you

## 2017-01-22 ENCOUNTER — Ambulatory Visit (INDEPENDENT_AMBULATORY_CARE_PROVIDER_SITE_OTHER): Payer: PPO | Admitting: Internal Medicine

## 2017-01-22 ENCOUNTER — Encounter: Payer: Self-pay | Admitting: Internal Medicine

## 2017-01-22 VITALS — BP 128/76 | HR 50 | Temp 97.6°F | Resp 14 | Ht 70.0 in | Wt 224.0 lb

## 2017-01-22 DIAGNOSIS — N400 Enlarged prostate without lower urinary tract symptoms: Secondary | ICD-10-CM

## 2017-01-22 DIAGNOSIS — Z1159 Encounter for screening for other viral diseases: Secondary | ICD-10-CM

## 2017-01-22 DIAGNOSIS — R739 Hyperglycemia, unspecified: Secondary | ICD-10-CM

## 2017-01-22 DIAGNOSIS — M4800 Spinal stenosis, site unspecified: Secondary | ICD-10-CM

## 2017-01-22 DIAGNOSIS — I10 Essential (primary) hypertension: Secondary | ICD-10-CM

## 2017-01-22 DIAGNOSIS — Z23 Encounter for immunization: Secondary | ICD-10-CM

## 2017-01-22 DIAGNOSIS — E785 Hyperlipidemia, unspecified: Secondary | ICD-10-CM

## 2017-01-22 LAB — BASIC METABOLIC PANEL
BUN: 15 mg/dL (ref 6–23)
CHLORIDE: 105 meq/L (ref 96–112)
CO2: 28 meq/L (ref 19–32)
Calcium: 9.4 mg/dL (ref 8.4–10.5)
Creatinine, Ser: 1.05 mg/dL (ref 0.40–1.50)
GFR: 74.46 mL/min (ref >60.00)
GLUCOSE: 118 mg/dL — AB (ref 70–99)
POTASSIUM: 4.3 meq/L (ref 3.5–5.1)
Sodium: 139 mEq/L (ref 135–145)

## 2017-01-22 LAB — LIPID PANEL
CHOL/HDL RATIO: 3
Cholesterol: 100 mg/dL (ref 0–200)
HDL: 33.1 mg/dL — AB (ref >39.00)
NONHDL: 66.49
TRIGLYCERIDES: 214 mg/dL — AB (ref 0.0–149.0)
VLDL: 42.8 mg/dL — AB (ref 0.0–40.0)

## 2017-01-22 LAB — AST: AST: 18 U/L (ref 0–37)

## 2017-01-22 LAB — LDL CHOLESTEROL, DIRECT: Direct LDL: 45 mg/dL

## 2017-01-22 LAB — ALT: ALT: 18 U/L (ref 0–53)

## 2017-01-22 LAB — HEMOGLOBIN A1C: Hgb A1c MFr Bld: 5.9 % (ref 4.6–6.5)

## 2017-01-22 NOTE — Assessment & Plan Note (Addendum)
Prediabetes: Check a a1c. Feet exam (-) 2 days ago HTN: Seems well-controlled on Hyzaar, metoprolol.check a BMP Hyperlipidemia:on Crestor, check LFTs, FLP. CAD: Saw cardiology 01/19/2017, felt to be stable. asx  Spinal stenosis: Pain somewhat increased , plans to d/w h neurosurgery.  Elevated PSA, ED, BPH: Saw urology, note reviewed, was rec to follow-up in one year Testosterone:  Previously  free testosterone was wnl but SHBG was low, I discussed that w/  Endo informally:  "SHBG is a "metabolic barometer" - he needs to improve diet (less fat and conc carbs, more fiber, antioxidants, fluids and increase exercise, limit stress, sleep better, etc) to rise the SHBG. If the free Testosterone is normal, he is not deficient, but may become, if SHBG remains as low or gets lower" Primary care:  -PNM  25  Was 6 years ago before he was 58. Booster today -Hepatitis C screening RTC 6 months, physical exam

## 2017-01-22 NOTE — Patient Instructions (Signed)
GO TO THE LAB : Get the blood work     GO TO THE FRONT DESK Schedule your next appointment for a  physical exam in 6 months  

## 2017-01-22 NOTE — Progress Notes (Signed)
Subjective:    Patient ID: George Alvarez, male    DOB: 28-Mar-1946, 71 y.o.   MRN: [id number]  DOS:  01/22/2017 Type of visit - description : rov Interval history: CAD: Note from cardiology reviewed Chronic back pain: Slightly more intense lately, plans to discuss with neurosurgery HTN: Good compliance of medication, ambulatory BPs within normal Saw urology, note reviewed Asthma, Allergies: Good med compliance, has not needed his albuterol rescue inhaler   Review of Systems Denies chest pain or difficulty breathing No nausea, vomiting, blood in the stools. No dysuria, gross hematuria  Past Medical History:  Diagnosis Date  . Anal fissure   . Asthma, chronic 06/16/2007   Qualifier: Diagnosis of  By: Linna Darner MD, Gwyndolyn Saxon   Onset:as child Triggers (environmental, infectious, allergic): all, mainly environmental triggers Rescue inhaler YCX:KGYJEH Maintenance medications/ response:Singulair,Qvar Smoking history:never Family history pulmonary disease: no    . Basal cell carcinoma of chest wall 2016   1.8 cm, treated with electrodessication and currettage  . Benign paroxysmal positional vertigo 11/19/2012   Diagnosed at [facility], [facility]  Physical therapy appointment pending   . BPH (benign prostatic hyperplasia)    With urinary obstruction  . Coronary artery disease    a. 02/2012 Cath/PCI: LM 10, LAD min irregs, LCX large, OM1 sm, 40 ost, OM2 95p (5.0x16 Veriflex & 4.5x12 Veriflex BMS'), RCA 30p, 16m, 30d, PDA/PLA min irregs, EF 65%  . Diverticulosis   . Erectile dysfunction due to arterial insufficiency   . Fundic gland polyps of stomach, benign 2010  . GERD (gastroesophageal reflux disease)   . History of elevated PSA   . Hyperlipidemia   . Hypertension   . Nocturia   . Perennial allergic rhinitis   . Peyronie's disease   . Skin cancer    Basal and squamous cell cancers, greater than 20  . Stroke ([place]) 2016  . Tubular adenoma of colon 2010    Past Surgical  History:  Procedure Laterality Date  . COLONOSCOPY  2003, 2011   negative  . CORONARY ANGIOPLASTY WITH STENT PLACEMENT  02/06/2012   OM2  bare metal   . EPIDURAL BLOCK INJECTION  04-2015   Dr Brien Few  . epidural steroids  2007, 2009   X 2 @ cervical &, lumbar)  . HERNIA REPAIR    . INTRAVASCULAR ULTRASOUND  02/06/2012   Procedure: INTRAVASCULAR ULTRASOUND;  Surgeon: [name], MD;  Location: [facility] CATH LAB;  Service: Cardiovascular;;  . LEFT HEART CATHETERIZATION WITH CORONARY ANGIOGRAM N/A 02/06/2012   Procedure: LEFT HEART CATHETERIZATION WITH CORONARY ANGIOGRAM;  Surgeon: [name], MD;  Location: [facility] CATH LAB;  Service: Cardiovascular;  Laterality: N/A;  . PERCUTANEOUS CORONARY STENT INTERVENTION (PCI-S)  02/06/2012   Procedure: PERCUTANEOUS CORONARY STENT INTERVENTION (PCI-S);  Surgeon: [name], MD;  Location: [facility] C[facility] LAB;  Service: Cardiovascular;;  . septoplasty    . TONSILLECTOMY AND ADENOIDECTOMY    . TRIGGER FINGER RELEASE    . UPPER GASTROINTESTINAL ENDOSCOPY  2011   gastric polyps  . VASECTOMY      Social History   Social History  . Marital status: Married    Spouse name: N/A  . Number of children: 3  . Years of education: N/A   Occupational History  . 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 Smoking status: Never Smoker  . Smokeless tobacco: Never Used  . Alcohol use 0.0 oz/week     Comment: Wine occasionally  .  Drug use: No  . Sexual activity: Yes    Partners: Female   Other Topics Concern  . Not on file   Social History Narrative   3 daughters, 34 g-children   lifes w/ wife in [place] as of 01/22/2017      Reactions   Aspirin    Angioedema in 1980s Medi Alert bracelet  is recommended   Nifedipine Rash   edema      Medication List       Accurate as of 01/22/17  1:55 PM. Always use your most recent med list.          albuterol 108 (90 Base) MCG/ACT  inhaler Commonly known as:  PROVENTIL HFA;VENTOLIN HFA 2 puffs every 4 hours if needed for coughing or wheezing spells   clopidogrel 75 MG tablet Commonly known as:  PLAVIX Take 1 tablet (75 mg total) by mouth daily. Please keep upcoming appointment for future refills   [facility] ALLERGY PO Take 1 tablet by mouth daily.   famotidine 20 MG tablet Commonly known as:  PEPCID Take 1 tablet (20 mg total) by mouth daily.   fluorouracil 5 % cream Commonly known as:  EFUDEX Apply 1 application topically daily. Reported on 09/10/2015   losartan-hydrochlorothiazide 50-12.5 MG tablet Commonly known as:  HYZAAR Take 1 tablet by mouth daily.   metoprolol tartrate 25 MG tablet Commonly known as:  LOPRESSOR Take 0.5 tablets (12.5 mg total) by mouth 2 (two) times daily.   mometasone 50 MCG/ACT nasal spray Commonly known as:  NASONEX One spray in each nostril twice a day if needed for stuffy nose   montelukast 10 MG tablet Commonly known as:  SINGULAIR ONE TABLET ONCE A DAY FOR COUGHING OR WHEEZING   nitroGLYCERIN 0.4 MG SL tablet Commonly known as:  NITROSTAT Place 1 tablet (0.4 mg total) under the tongue every 5 (five) minutes as needed for chest pain.   NON FORMULARY [facility]  Onychomycosis Nail Lacquer -  Fluconazole 2%, Terbinafine 1% DMSO Apply to affected nail once daily Qty. 120 gm 3 refills   NON FORMULARY [facility]  Wart Cream-  Cimetidine 2%, Deoxy-D-Glucose 0.2%, Fluorouracil-5 5%, Salicylic Acid 25% Apply 1-2 grams to affected area 3-4 times daily Qty. 120 gm 3 refills   QVAR REDIHALER 80 MCG/ACT inhaler Generic drug:  beclomethasone Inhale 2 puffs into the lungs 2 (two) times daily.   rosuvastatin 40 MG tablet Commonly known as:  CRESTOR Take 1 tablet (40 mg total) by mouth daily.   tadalafil 5 MG tablet Commonly known as:  CIALIS Take 5 mg by mouth daily as needed for erectile dysfunction.          Objective:   Physical Exam BP 128/76 (BP  Location: Left Arm, Patient Position: Sitting, Cuff Size: Normal)   Pulse (!) 50   Temp 97.6 F (36.4 C) (Oral)   Resp 14   Ht 5\' 10"  (1.778 m)   Wt 224 lb (101.6 kg)   SpO2 96%   BMI 32.14 kg/m  General:   Well developed, well nourished . NAD.  HEENT:  Normocephalic . Face symmetric, atraumatic Lungs:  CTA B Normal respiratory effort, no intercostal retractions, no accessory muscle use. Heart: RRR,  no murmur.  No pretibial edema bilaterally  Skin: Not pale. Not jaundice Neurologic:  alert & oriented X3.  Speech normal, gait appropriate for age and unassisted Psych--  Cognition and judgment appear intact.  Cooperative with normal attention  span and concentration.  Behavior appropriate. No anxious or depressed appearing.      Assessment & Plan:  Assessment  Prediabetes HTN Hyperlipidemia CV: Dr Angelena Form --CAD --Stroke, seen in a MRI Asthma- Dr Darcey Nora  MSK --DJD knees used to see Dr Len Childs 2016, local injections --Spinal stenosis (neck-back) - Dr Cyndy Freeze dx ~ 2008 via MRI neck, back, had local injections 2008; then 04-2015 GERD Benign positional vertigo-- did PT before GU: --Elevated PSA -- Dr Jeffie Pollock --Peyronie's  Disease Skin cancer : [facility] Dr Susie Cassette   PLAN: Prediabetes: Check a a1c. Feet exam (-) 2 days ago HTN: Seems well-controlled on Hyzaar, metoprolol.check a BMP Hyperlipidemia:on Crestor, check LFTs, FLP. CAD: Saw cardiology 01/19/2017, felt to be stable. asx  Spinal stenosis: Pain somewhat increased , plans to d/w h neurosurgery.  Elevated PSA, ED, BPH: Saw urology, note reviewed, was rec to follow-up in one year Testosterone:  Previously  free testosterone was wnl but SHBG was low, I discussed that w/  Endo informally:  "SHBG is a "metabolic barometer" - he needs to improve diet (less fat and conc carbs, more fiber, antioxidants, fluids and increase exercise, limit stress, sleep better, etc) to rise the SHBG. If the free Testosterone is normal, he is  not deficient, but may become, if SHBG remains as low or gets lower" Primary care:  -PNM  25  Was 6 years ago before he was 75. Booster today -Hepatitis C screening RTC 6 months, physical exam

## 2017-01-22 NOTE — Progress Notes (Signed)
Pre visit review using our clinic review tool, if applicable. No additional management support is needed unless otherwise documented below in the visit note. 

## 2017-01-23 DIAGNOSIS — M65342 Trigger finger, left ring finger: Secondary | ICD-10-CM | POA: Diagnosis not present

## 2017-01-23 LAB — HEPATITIS C ANTIBODY
Hepatitis C Ab: NONREACTIVE
SIGNAL TO CUT-OFF: 0 (ref <1.00)

## 2017-01-29 ENCOUNTER — Other Ambulatory Visit: Payer: Self-pay | Admitting: Internal Medicine

## 2017-02-02 ENCOUNTER — Other Ambulatory Visit: Payer: Self-pay

## 2017-02-02 MED ORDER — MONTELUKAST SODIUM 10 MG PO TABS: ORAL_TABLET | ORAL | 0 refills | Status: DC

## 2017-02-03 ENCOUNTER — Encounter: Payer: Self-pay | Admitting: Podiatry

## 2017-02-04 ENCOUNTER — Other Ambulatory Visit: Payer: Self-pay | Admitting: Cardiovascular Disease

## 2017-02-06 DIAGNOSIS — L209 Atopic dermatitis, unspecified: Secondary | ICD-10-CM | POA: Diagnosis not present

## 2017-02-06 DIAGNOSIS — J45909 Unspecified asthma, uncomplicated: Secondary | ICD-10-CM | POA: Diagnosis not present

## 2017-02-16 ENCOUNTER — Other Ambulatory Visit: Payer: Self-pay | Admitting: Internal Medicine

## 2017-02-16 ENCOUNTER — Other Ambulatory Visit: Payer: Self-pay | Admitting: Cardiovascular Disease

## 2017-02-17 ENCOUNTER — Ambulatory Visit: Payer: PPO | Admitting: Pediatrics

## 2017-02-17 ENCOUNTER — Ambulatory Visit: Payer: Self-pay | Admitting: Pediatrics

## 2017-02-17 ENCOUNTER — Encounter: Payer: Self-pay | Admitting: Pediatrics

## 2017-02-17 VITALS — BP 114/74 | HR 72 | Temp 98.0°F | Resp 24

## 2017-02-17 DIAGNOSIS — K219 Gastro-esophageal reflux disease without esophagitis: Secondary | ICD-10-CM | POA: Diagnosis not present

## 2017-02-17 DIAGNOSIS — J4540 Moderate persistent asthma, uncomplicated: Secondary | ICD-10-CM | POA: Diagnosis not present

## 2017-02-17 DIAGNOSIS — J3089 Other allergic rhinitis: Secondary | ICD-10-CM

## 2017-02-17 DIAGNOSIS — Z79899 Other long term (current) drug therapy: Secondary | ICD-10-CM | POA: Diagnosis not present

## 2017-02-17 DIAGNOSIS — I10 Essential (primary) hypertension: Secondary | ICD-10-CM

## 2017-02-17 LAB — HM DIABETES EYE EXAM

## 2017-02-17 MED ORDER — FLUTICASONE PROPIONATE 50 MCG/ACT NA SUSP: NASAL | 5 refills | Status: DC

## 2017-02-17 NOTE — Patient Instructions (Addendum)
Fluticasone 1 spray per nostril twice a day if needed for stuffy nose Chlorpheniramine 4 mg once a day for runny nose Flovent 110-1 puff twice a day to prevent coughing or wheezing Ventolin 2 puffs every 4 hours if needed for wheezing or coughing spells Montelukast  10 mg once a day for coughing or wheezing Call me if you're not doing well on this treatment plan

## 2017-02-17 NOTE — Progress Notes (Signed)
  [place] [address] Dept: [phone]  FOLLOW UP NOTE  Patient ID: George Alvarez, male    DOB: 1945-09-21  Age: 71 y.o. MRN: [id number] Date of Office Visit: 02/17/2017  Assessment  Chief Complaint: Allergic Rhinitis ; Asthma; and Rash (after his flight pt. noticed a rash on both forearms it also effected his breathing. went to an urgent care. steroid injection given and a breathing tx given. 10 days ago.)  HPI [name] presents for follow-up of asthma and allergic rhinitis. He may have had poison ivy 10 days ago and was treated with a steroid injection and  3 days of prednisone. His asthma has been well controlled since his visit of April 2018. He has had a flu vaccination and a Pneumovax . His nasal symptoms have been well controlled  Current medications will be continued and outlined in the after visit summary. His other medications are outlined in the chart   Drug Allergies:  Allergies  Allergen Reactions  . Aspirin     Angioedema in 1980s Medi Alert bracelet  is recommended  . Nifedipine Rash    edema    Physical Exam: BP 114/74 (BP Location: Right Arm, Patient Position: Sitting, Cuff Size: Normal)   Pulse 72   Temp 98 F (36.7 C) (Oral)   Resp (!) 24   SpO2 96%    Physical Exam  Constitutional: He is oriented to person, place, and time. He appears well-developed and well-nourished.  HENT:  Eyes normal. Ears normal. Nose normal. Pharynx normal.  Neck: Neck supple.  Cardiovascular:  S1 and S2 normal no murmurs  Pulmonary/Chest:  Cear to percussion and auscultation  Lymphadenopathy:    He has no cervical adenopathy.  Neurological: He is alert and oriented to person, place, and time.  Psychiatric: He has a normal mood and affect. His behavior is normal. Thought content normal.  Vitals reviewed.   Diagnostics:  FVC 3.18 L FEV1 2.40 L. Predicted FVC 1.51 L predicted FEV1 3.34 L-this shows a minimal reduction in the forced vital  capacity  Assessment and Plan: 1. Moderate persistent asthma without complication   2. Gastroesophageal reflux disease without esophagitis   3. Essential hypertension   4. Current use of beta blocker   5. Other allergic rhinitis     Meds ordered this encounter  Medications  . fluticasone (FLONASE) 50 MCG/ACT nasal spray    Sig: One spray each nostril twice a day if needed for stuffy nose.    Dispense:  16 g    Refill:  5    Keep rx on file.  Patient will call for fill.    Patient Instructions  Fluticasone 1 spray per nostril twice a day if needed for stuffy nose Chlorpheniramine 4 mg once a day for runny nose Flovent 110-1 puff twice a day to prevent coughing or wheezing Ventolin 2 puffs every 4 hours if needed for wheezing or coughing spells Montelukast  10 mg once a day for coughing or wheezing Call me if you're not doing well on this treatment plan   Return in about 6 months (around 08/17/2017).    Thank you for the opportunity to care for this patient.  Please do not hesitate to contact me with questions.  Penne Lash, M.D.  [facility] [address] [place], [place] [address] [phone]

## 2017-02-20 ENCOUNTER — Telehealth: Payer: Self-pay | Admitting: Internal Medicine

## 2017-02-20 NOTE — Telephone Encounter (Signed)
Copied from [place] [id number]. Topic: Quick Communication - See Telephone Encounter >> Feb 20, 2017  2:48 PM Rosalin Hawking wrote: CRM for notification. See Telephone encounter for:  02/20/17.   Pt dropped off Diabetes Evaluation Report for provider to have on pt's chart (1 page). Document put at front office tray.

## 2017-02-25 ENCOUNTER — Encounter: Payer: Self-pay | Admitting: Internal Medicine

## 2017-03-03 ENCOUNTER — Ambulatory Visit (INDEPENDENT_AMBULATORY_CARE_PROVIDER_SITE_OTHER): Payer: PPO | Admitting: Podiatry

## 2017-03-03 DIAGNOSIS — L603 Nail dystrophy: Secondary | ICD-10-CM

## 2017-03-03 DIAGNOSIS — B070 Plantar wart: Secondary | ICD-10-CM | POA: Diagnosis not present

## 2017-03-05 NOTE — Progress Notes (Signed)
Subjective: George Alvarez presents the office today for follow-up evaluation of a wart to the right fifth toe as well as for right hallux nail fungus.  He states that the toenail is doing well.  He has had no treatment for the wart since I last saw him.  He tried using a compound cream that I ordered as well as using the topical antifungal he started to develop a rash on his arms.  He is unsure if this is related to the medication but we had him stop the medicine so therefore he is not on any treatment.  He is currently using Efudex for space for skin cancer.  Denies any changes. Denies any systemic complaints such as fevers, chills, nausea, vomiting. No acute changes since last appointment, and no other complaints at this time.   Objective: AAO x3, NAD DP/PT pulses palpable bilaterally, CRT less than 3 seconds Multiple areas of verruca present the right fifth digit but there is no pain, swelling, redness or drainage or any signs of infection.  Right hallux toe noticed to be dystrophic, discolored yellow to brown discoloration and starting to grow out.  There is no pain to the nail there is no redness or drainage or any signs of infection No edema, erythema, increase in warmth to bilateral lower extremities.  No open lesions or pre-ulcerative lesions.  No pain with calf compression, swelling, warmth, erythema  Assessment: Right fifth digit verruca; right hallux onychomycosis  Plan: -All treatment options discussed with the patient including all alternatives, risks, complications.  -He has had no treatment so I last saw him and we discussed options at this point.  He states that once he finishes the skin cancer treatment for space he will restart ointment for his feet.  We will start only with the verruca compound cream first.  If he has any skin reaction we will stop this and try Efudex for the toe as well.  Once we knew that there is no reaction to that cream we will restart topical antifungal medicine.   He agrees with this plan. -Patient encouraged to call the office with any questions, concerns, change in symptoms.   Trula Slade DPM

## 2017-03-28 ENCOUNTER — Other Ambulatory Visit: Payer: Self-pay | Admitting: Internal Medicine

## 2017-04-09 ENCOUNTER — Telehealth: Payer: Self-pay

## 2017-04-09 NOTE — Telephone Encounter (Signed)
   Primary Cardiologist: Lauree Chandler, MD  Chart reviewed as part of pre-operative protocol coverage. Given past medical history and time since last visit, based on ACC/AHA guidelines, George Alvarez would be at acceptable risk for the planned procedure without further cardiovascular testing. He is intolerant to ASA and is on chronic plavix therapy. He may hold his plavix beginning 5-7 days prior to his planned epidural injection with a plan to resume plavix post-procedure.  Please call with questions.  Murray Hodgkins, NP 04/09/2017, 3:06 PM

## 2017-04-09 NOTE — Telephone Encounter (Signed)
   [facility] Pre-operative Risk Assessment    Request for surgical clearance:  1. What type of surgery is being performed? Bilateral TFESI  2. When is this surgery scheduled? TBD   3. Are there any medications that need to be held prior to surgery and how long? Please advise. Before this procedure can be scheduled, the patient needs to discontinue plavix for 7 days prior.  4. Practice name and name of physician performing surgery? [facility]. Dr. Marlaine Hind.  5. What is your office phone and fax number? Phone: [phone]. Fax: [phone].   6. Anesthesia type (None, local, MAC, general) ? Not specified.   Stephannie Peters 04/09/2017, 2:15 PM  _________________________________________________________________   (provider comments below)

## 2017-04-22 DIAGNOSIS — I10 Essential (primary) hypertension: Secondary | ICD-10-CM | POA: Diagnosis not present

## 2017-04-22 DIAGNOSIS — Z6832 Body mass index (BMI) 32.0-32.9, adult: Secondary | ICD-10-CM | POA: Diagnosis not present

## 2017-04-22 DIAGNOSIS — M48062 Spinal stenosis, lumbar region with neurogenic claudication: Secondary | ICD-10-CM | POA: Diagnosis not present

## 2017-04-27 ENCOUNTER — Other Ambulatory Visit: Payer: Self-pay | Admitting: Pediatrics

## 2017-05-18 ENCOUNTER — Encounter: Payer: Self-pay | Admitting: Family Medicine

## 2017-05-18 ENCOUNTER — Ambulatory Visit (INDEPENDENT_AMBULATORY_CARE_PROVIDER_SITE_OTHER): Payer: PPO | Admitting: Family Medicine

## 2017-05-18 VITALS — BP 120/78 | HR 74 | Temp 98.2°F | Resp 16

## 2017-05-18 DIAGNOSIS — K219 Gastro-esophageal reflux disease without esophagitis: Secondary | ICD-10-CM

## 2017-05-18 DIAGNOSIS — J3089 Other allergic rhinitis: Secondary | ICD-10-CM

## 2017-05-18 DIAGNOSIS — J4541 Moderate persistent asthma with (acute) exacerbation: Secondary | ICD-10-CM | POA: Diagnosis not present

## 2017-05-18 DIAGNOSIS — Z79899 Other long term (current) drug therapy: Secondary | ICD-10-CM | POA: Diagnosis not present

## 2017-05-18 DIAGNOSIS — I10 Essential (primary) hypertension: Secondary | ICD-10-CM

## 2017-05-18 MED ORDER — AZITHROMYCIN 250 MG PO TABS: ORAL_TABLET | ORAL | 0 refills | Status: DC

## 2017-05-18 NOTE — Progress Notes (Signed)
[place] [address] Dept: [phone]  FOLLOW UP NOTE  Patient ID: George Alvarez, male    DOB: 10-29-1945  Age: 72 y.o. MRN: [id number] Date of Office Visit: 05/18/2017  Assessment  Chief Complaint: Cough (x's 2 days. coughing up greenish in the morning. (L) ear pain just returned from [place]. exposed to the grandchildren who had colds.)  HPI George Alvarez is a 72 year old male who presents to the clinic for a sick visit today. He was last seen in this clinic on 02/17/2017 by Dr. Shaune Leeks for evaluation of asthma, allergic rhinitis, and acute rash.  At that visit, was reported as doing well with his asthma, and allergic rhinitis and he was continued on fluticasone nasal spray 1-2 sprays per nostril as needed, Flovent 110-1 puff twice a day, montelukast 10 mg once a day, chlorpheniramine 4 mg once a day, and Ventolin 2 puffs every 4 hours as needed for coughing or wheezing.  At today's visit, he reports cough, wheeze, and chest tightness which began on Friday morning.  He reports he has recently returned home to [place] from [place] where he was visiting his kids and grandkids, all of whom are currently sick with a cold.  He reports his cough became productive with green/gray sputum last night and prevented him from sleeping. He is currently experiencing chest tightness, shortness of breath, wheezing, and limitation of daily activities due to his current asthma exacerbation.  Since Friday, 4 days ago, he has started taking 2 puffs of Flovent 110 twice a day and is using Proventil about 2 times a day which provides some relief. He reports his left ear has been achy since the end of the flight despite using Afrin nasal spray prior to the flight.  Current medications include fluticasone 1 spray per nostril twice a day as needed, chlorpheniramine 4 mg once a day as needed, Flovent 110- 2 puffs twice a day, Ventolin 2 puffs every 4 hours as needed, and montelukast 10  mg once a day. Other medications are outlined in the chart.     Drug Allergies:  Allergies  Allergen Reactions  . Aspirin     Angioedema in 1980s Medi Alert bracelet  is recommended  . Nifedipine Rash    edema    Physical Exam: BP 120/78 (BP Location: Left Arm, Patient Position: Sitting, Cuff Size: Large)   Pulse 74   Temp 98.2 F (36.8 C) (Oral)   Resp 16   SpO2 95%    Physical Exam  Constitutional: He is oriented to person, place, and time. He appears well-developed and well-nourished.  HENT:  Right Ear: External ear normal.  Left Ear: External ear normal.  Eyes normal. Ears normal. Bilateral nares erythematous and edematous. Pharynx erythematous with no exudate.   Eyes: Conjunctivae are normal.  Neck: Normal range of motion. Neck supple.  Cardiovascular: Normal rate, regular rhythm and normal heart sounds.  S1S2 normal. Regular heart rate and rhythm. No murmur noted.  Pulmonary/Chest: Effort normal and breath sounds normal.  Lungs clear to auscultation.   Musculoskeletal: Normal range of motion.  Neurological: He is alert and oriented to person, place, and time.  Skin: Skin is warm and dry.  Psychiatric: He has a normal mood and affect. His behavior is normal. Judgment and thought content normal.    Diagnostics: FVC 3.14, FEV1 2.51.  Predicted FVC 4.48, predicted FEV1 3.30.  Spirometry indicates mild restriction.  Assessment and Plan: 1. Moderate persistent asthma with acute  exacerbation   2. Other allergic rhinitis   3. Essential hypertension   4. Current use of beta blocker   5. Gastroesophageal reflux disease without esophagitis     Meds ordered this encounter  Medications  . azithromycin (ZITHROMAX) 250 MG tablet    Sig: Take 2 tablets on day one, then 1 tablet day 2 thru 5 for infection.    Dispense:  6 each    Refill:  0    Patient Instructions  Fluticasone 1 spray per nostril twice a day if needed for stuffy nose Chlorpheniramine 4 mg once a day  for runny nose Flovent 110-2 puffs twice a day to prevent coughing or wheezing for 2 weeks or until breathing is back to baseline, then decrease back to Flovent 110- 1 puff twice a day.  Ventolin 2 puffs every 4 hours if needed for wheezing or coughing spells Montelukast  10 mg once a day for coughing or wheezing Prednisone 10 mg. To improve breathing. Take 2 tablets twice a day for 3 days, then take 2 tablets for 1 day, then take 1 tablet for 1 day, then stop.  Azithromycin 250 mg. for infection. Take 2 tablets (500 mg) on the first day, then take 1 tablet (250 mg) for the next 4 days, then stop. Robitussin DM at night as needed for cough  Continue other medications as listed in your chart  Call me if you're not doing well on this treatment plan  Follow up in 4 months   Return in about 4 months (around 09/15/2017), or if symptoms worsen or fail to improve.   Tandre Conly was seen in the clinic with Dr. Shaune Leeks today.   Gareth Morgan, FNP [facility]  Thank you for the opportunity to care for this patient.  Please do not hesitate to contact me with questions.  Penne Lash, M.D.  [facility] [address] [place], [place] [address] [phone]

## 2017-05-18 NOTE — Patient Instructions (Addendum)
Fluticasone 1 spray per nostril twice a day if needed for stuffy nose Chlorpheniramine 4 mg once a day for runny nose Flovent 110-2 puffs twice a day to prevent coughing or wheezing for 2 weeks or until breathing is back to baseline, then decrease back to Flovent 110- 1 puff twice a day.  Ventolin 2 puffs every 4 hours if needed for wheezing or coughing spells Montelukast  10 mg once a day for coughing or wheezing Prednisone 10 mg. To improve breathing. Take 2 tablets twice a day for 3 days, then take 2 tablets for 1 day, then take 1 tablet for 1 day, then stop.  Azithromycin 250 mg. for infection. Take 2 tablets (500 mg) on the first day, then take 1 tablet (250 mg) for the next 4 days, then stop. Robitussin DM at night as needed for cough  Continue other medications as listed in your chart  Call me if you're not doing well on this treatment plan  Follow up in 4 months

## 2017-06-12 ENCOUNTER — Other Ambulatory Visit: Payer: Self-pay

## 2017-06-12 MED ORDER — LOSARTAN POTASSIUM-HCTZ 50-12.5 MG PO TABS: 1.0000 | ORAL_TABLET | Freq: Every day | ORAL | 1 refills | Status: DC

## 2017-06-18 ENCOUNTER — Telehealth: Payer: Self-pay

## 2017-06-18 MED ORDER — OLMESARTAN MEDOXOMIL-HCTZ 20-12.5 MG PO TABS: 1.0000 | ORAL_TABLET | Freq: Every day | ORAL | 1 refills | Status: DC

## 2017-06-18 NOTE — Telephone Encounter (Signed)
Received fax from [facility] 50-12.5mg  not available at this time. Requesting alternative. Please advise.

## 2017-06-18 NOTE — Telephone Encounter (Signed)
Recommend Benicar HCTZ 20/12.5: 1 tablet daily, monitor BPs to be sure they remain okay. Needs to be seen in April, at the time we will recheck a BMP since we are changing his medication.

## 2017-06-18 NOTE — Telephone Encounter (Signed)
Rx sent to [facility]. MyChart message sent to Pt to inform of change. Informed him he is due for visit next month.

## 2017-07-06 ENCOUNTER — Encounter: Payer: Self-pay | Admitting: Pediatrics

## 2017-07-06 ENCOUNTER — Ambulatory Visit (INDEPENDENT_AMBULATORY_CARE_PROVIDER_SITE_OTHER): Payer: PPO | Admitting: Pediatrics

## 2017-07-06 VITALS — BP 126/76 | HR 74 | Temp 98.3°F | Resp 16

## 2017-07-06 DIAGNOSIS — Z79899 Other long term (current) drug therapy: Secondary | ICD-10-CM | POA: Diagnosis not present

## 2017-07-06 DIAGNOSIS — J4540 Moderate persistent asthma, uncomplicated: Secondary | ICD-10-CM | POA: Diagnosis not present

## 2017-07-06 DIAGNOSIS — I10 Essential (primary) hypertension: Secondary | ICD-10-CM

## 2017-07-06 DIAGNOSIS — B9789 Other viral agents as the cause of diseases classified elsewhere: Secondary | ICD-10-CM

## 2017-07-06 DIAGNOSIS — J069 Acute upper respiratory infection, unspecified: Secondary | ICD-10-CM | POA: Insufficient documentation

## 2017-07-06 DIAGNOSIS — J3089 Other allergic rhinitis: Secondary | ICD-10-CM

## 2017-07-06 NOTE — Patient Instructions (Addendum)
Chlorpheniramine 4 mg once or twice a day if needed for runny nose Fluticasone 1 spray per nostril twice a day for stuffy nose Flovent 110-2 puffs twice a day for about 2 weeks to prevent coughing and wheezing. Then decrease to Flovent 110 one puff twice a day if you're doing well Ventolin 2 puffs every 4 hours if needed for wheezing or coughing spells Montelukast 10 mg once a day for coughing or wheezing Afrin nasal spray one hour  before you fly Continue on your other medications If your  symptoms get worse, add prednisone 10 mg twice a day for 4 days, 10 mg on the fifth day Have a piece of non  sugar candy when you are flying in the plane Call me if you are not doing well on this treatment plan

## 2017-07-06 NOTE — Progress Notes (Signed)
  [place] [address] Dept: [phone]  FOLLOW UP NOTE  Patient ID: George Alvarez, male    DOB: 1946-03-19  Age: 72 y.o. MRN: [id number] Date of Office Visit: 07/06/2017  Assessment  Chief Complaint: Allergic Rhinitis ; Asthma; Cough; and Nasal Congestion  HPI [name] presents for evaluation of a cough and nasal congestion, fullness in the ears which began on March 28 after he landed from an [employer]. He has continued to have a mild cough, nasal congestion and fullness in the ear.He has not had a fever.He is flying out again next week. He is on Flovent 110-1 puff twice a day and Ventolin 2 puffs every 4 hours if needed and montelukast 10 mg once a day. His  other medications are outlined in the chart   Drug Allergies:  Allergies  Allergen Reactions  . Aspirin     Angioedema in 1980s Medi Alert bracelet  is recommended  . Nifedipine Rash    edema    Physical Exam: BP 126/76 (BP Location: Right Arm, Patient Position: Sitting, Cuff Size: Normal)   Pulse 74   Temp 98.3 F (36.8 C) (Oral)   Resp 16   SpO2 96%    Physical Exam  Constitutional: He is oriented to person, place, and time. He appears well-developed and well-nourished.  HENT:  Eyes normal. Ears normal. Nose mild swelling of nasal turbinates. Pharynx normal.  Neck: Neck supple.  Cardiovascular:  S1 and S2 normal no murmurs  Pulmonary/Chest:  Clear to percussion and auscultation  Lymphadenopathy:    He has no cervical adenopathy.  Neurological: He is alert and oriented to person, place, and time.  Psychiatric: He has a normal mood and affect. His behavior is normal. Judgment and thought content normal.  Vitals reviewed.   Diagnostics:  FVC 3.19 L FEV1 2.44 L. Predicted FVC 4.48 L predicted FEV1 3.30 L-this shows a mild reduction in the FEV1 percent but stable for him  Assessment and Plan: 1. Moderate persistent asthma without complication   2. Viral upper respiratory  tract infection with cough   3. Other allergic rhinitis   4. Essential hypertension   5. Current use of beta blocker        Patient Instructions  Chlorpheniramine 4 mg once or twice a day if needed for runny nose Fluticasone 1 spray per nostril twice a day for stuffy nose Flovent 110-2 puffs twice a day for about 2 weeks to prevent coughing and wheezing. Then decrease to Flovent 110 one puff twice a day if you're doing well Ventolin 2 puffs every 4 hours if needed for wheezing or coughing spells Montelukast 10 mg once a day for coughing or wheezing Afrin nasal spray one hour  before you fly Continue on your other medications If your  symptoms get worse, add prednisone 10 mg twice a day for 4 days, 10 mg on the fifth day Have a piece of non  sugar candy when you are flying in the plane Call me if you are not doing well on this treatment plan   Return in about 6 months (around 01/05/2018).    Thank you for the opportunity to care for this patient.  Please do not hesitate to contact me with questions.  Penne Lash, M.D.  [facility] [address] [place], [place] [address] [phone]

## 2017-07-12 ENCOUNTER — Other Ambulatory Visit: Payer: Self-pay | Admitting: Internal Medicine

## 2017-07-20 DIAGNOSIS — L2089 Other atopic dermatitis: Secondary | ICD-10-CM | POA: Diagnosis not present

## 2017-07-20 DIAGNOSIS — L218 Other seborrheic dermatitis: Secondary | ICD-10-CM | POA: Diagnosis not present

## 2017-07-20 DIAGNOSIS — R21 Rash and other nonspecific skin eruption: Secondary | ICD-10-CM | POA: Diagnosis not present

## 2017-07-22 ENCOUNTER — Encounter: Payer: Self-pay | Admitting: Internal Medicine

## 2017-07-22 ENCOUNTER — Ambulatory Visit (INDEPENDENT_AMBULATORY_CARE_PROVIDER_SITE_OTHER): Payer: PPO | Admitting: Internal Medicine

## 2017-07-22 VITALS — BP 119/77 | HR 67 | Temp 98.8°F | Resp 16 | Ht 69.5 in | Wt 226.8 lb

## 2017-07-22 DIAGNOSIS — R5383 Other fatigue: Secondary | ICD-10-CM | POA: Diagnosis not present

## 2017-07-22 DIAGNOSIS — Z0000 Encounter for general adult medical examination without abnormal findings: Secondary | ICD-10-CM

## 2017-07-22 DIAGNOSIS — E785 Hyperlipidemia, unspecified: Secondary | ICD-10-CM | POA: Diagnosis not present

## 2017-07-22 DIAGNOSIS — I2510 Atherosclerotic heart disease of native coronary artery without angina pectoris: Secondary | ICD-10-CM | POA: Diagnosis not present

## 2017-07-22 DIAGNOSIS — E1065 Type 1 diabetes mellitus with hyperglycemia: Secondary | ICD-10-CM

## 2017-07-22 DIAGNOSIS — R739 Hyperglycemia, unspecified: Secondary | ICD-10-CM

## 2017-07-22 DIAGNOSIS — H9209 Otalgia, unspecified ear: Secondary | ICD-10-CM | POA: Diagnosis not present

## 2017-07-22 DIAGNOSIS — Z0001 Encounter for general adult medical examination with abnormal findings: Secondary | ICD-10-CM | POA: Diagnosis not present

## 2017-07-22 DIAGNOSIS — R6882 Decreased libido: Secondary | ICD-10-CM | POA: Diagnosis not present

## 2017-07-22 MED ORDER — AZELASTINE HCL 0.1 % NA SOLN: 2.0000 | Freq: Every evening | NASAL | 1 refills | Status: DC | PRN

## 2017-07-22 NOTE — Patient Instructions (Signed)
  GO TO THE FRONT DESK Schedule your next appointment for a checkup in 3 months  Schedule labs to be done tomorrow or next week, no fasting, early in the morning  Consider see 1 of the weight management clinics  Astelin, nasal spray for 2-3 weeks

## 2017-07-22 NOTE — Assessment & Plan Note (Signed)
Td 2010, 06-2013 per pt ;Pneumonia shot 2013;Prevnar 2015; zostavax 2013. shingrex discussed  - PSA, DRE: Sees Dr. Jeffie Pollock routinely -Colonoscopy in 2010, 08-2014, next 5 years per GI letter  - Labs:  BMP, CBC, A1c, TSH. Total and free testosterone Sed rate, V81, folic acid, vitamin D -Diet and exercise discussed

## 2017-07-22 NOTE — Progress Notes (Signed)
Subjective:    Patient ID: George Alvarez, male    DOB: [birth year], 72 y.o.   MRN: [id number]  DOS:  07/22/2017 Type of visit - description : cpx Here for CPX, has multiple concerns.  Had a cold 3 weeks ago, he is better but still feels that both ears are plugged.  Denies fever chills, no cough, no sinus congestion, GERD not an issue.  Unable to lose weight.  Rash on both arms 3 months ago, saw dermatology, DX with a topical dermatitis.  Ongoing problems with lack of energy, decreased libido and very poor erections despite taking Cialis. Chart is reviewed, testosterone was a slightly low before. Denies chest pain, DOE, he is not very active due to spinal stenosis but he does water aerobics.  Also complained of pain at the hands, elbows, shoulders, usually in the morning.  Arms feel stiff when he wakes up, after approximately 30-40 minutes, symptoms usually go away. No pain around the hips. He has had for spinal stenosis with exertional lower extremity pain which is not new. Denies fever, chills, puffy or swelling joints of the hands.  No headaches No myalgias per se.   Review of Systems  Other than above, a 14 point review of systems is negative    Past Medical History:  Diagnosis Date  . Anal fissure   . Asthma, chronic 06/16/2007   Qualifier: Diagnosis of  By: Linna Darner MD, Gwyndolyn Saxon   Onset:as child Triggers (environmental, infectious, allergic): all, mainly environmental triggers Rescue inhaler WNU:UVOZDG Maintenance medications/ response:Singulair,Qvar Smoking history:never Family history pulmonary disease: no    . Basal cell carcinoma of chest wall 2016   1.8 cm, treated with electrodessication and currettage  . Benign paroxysmal positional vertigo 11/19/2012   Diagn[facility] at [facility], [facility]  Physical therapy appointment pending   . BPH (benign prostatic hyperplasia)    With urinary obstruction  . Coronary artery disease    a. 02/2012 Cath/PCI: LM 10, LAD min  irregs, LCX large, OM1 sm, 40 ost, OM2 95p (5.0x16 Veriflex & 4.5x12 Veriflex BMS'), RCA 30p, 65m, 30d, PDA/PLA min irregs, EF 65%  . Diverticulosis   . Erectile dysfunction due to arterial insufficiency   . Fundic gland polyps of stomach, benign 2010  . GERD (gastroesophageal reflux disease)   . History of elevated PSA   . Hyperlipidemia   . Hypertension   . Nocturia   . Perennial allergic rhinitis   . Peyronie's disease   . Skin cancer    Basal and squamous cell cancers, greater than 20  . Stroke ([place]) 2016  . Tubular adenoma of colon 2010    Past Surgical History:  Procedure Laterality Date  . COLONOSCOPY  2003, 2011   negative  . CORONARY ANGIOPLASTY WITH STENT PLACEMENT  02/06/2012   OM2  bare metal   . EPIDURAL BLOCK INJECTION  04-2015   Dr Brien Few  . epidural steroids  2007, 2009   X 2 @ cervical &, lumbar)  . HERNIA REPAIR    . INTRAVASCULAR ULTRASOUND  02/06/2012   Procedure: INTRAVASCULAR ULTRASOUND;  Surgeon: [name], MD;  Location: [facility] CATH LAB;  Service: Cardiovascular;;  . LEFT HEART CATHETERIZATION WITH CORONARY ANGIOGRAM N/A 02/06/2012   Procedure: LEFT HEART CATHETERIZATION WITH CORONARY ANGIOGRAM;  Surgeon: [name], MD;  Location: [facility] CATH LAB;  Service: Cardiovascular;  Laterality: N/A;  . PERCUTANEOUS CORONARY STENT INTERVENTION (PCI-S)  02/06/2012   Procedure: PERCUTANEOUS CORONARY STENT INTERVENTION (PCI-S);  Surgeon: Annita Brod  Angelena Form, MD;  Location: [facility] CATH LAB;  Service: Cardiovascular;;  . septoplasty    . TONSILLECTOMY AND ADENOIDECTOMY    . TRIGGER FINGER RELEASE    . UPPER GASTROINTESTINAL ENDOSCOPY  2011   gastric polyps  . VASECTOMY      Social History   Socioeconomic History  . Marital status: Married    Spouse name: Not on file  . Number of children: 3  . Years of education: Not on file  . Highest education level: Not on file  Occupational History  . Occupation: Higher education careers adviser   Social Needs   . Financial resource strain: Not on file  . Food insecurity:    Worry: Not on file    Inability: Not on file  . Transportation needs:    Medical: Not on file    Non-medical: Not on file  Tobacco Use  . Smoking status: Never Smoker  . Smokeless tobacco: Never Used  Substance and Sexual Activity  . Alcohol use: Yes    Alcohol/week: 0.0 oz    Comment: Wine occasionally  . Drug use: No  . Sexual activity: Yes    Partners: Female  Lifestyle  . Physical activity:    Days per week: Not on file    Minutes per session: Not on file  . Stress: Not on file  Relationships  . Social connections:    Talks on phone: Not on file    Gets together: Not on file    Attends religious service: Not on file    Active member of club or organization: Not on file    Attends meetings of clubs or organizations: Not on file    Relationship status: Not on file  . Intimate partner violence:    Fear of current or ex partner: Not on file    Emotionally abused: Not on file    Physically abused: Not on file    Forced sexual activity: Not on file  Other Topics Concern  . Not on file  Social History Narrative   3 daughters, 61 g-children   lifes w/ wife in [place]     Family History  Problem Relation Age of Onset  . Heart attack Father 81       died @ 82  . Heart disease Mother   . Breast cancer Mother   . Esophageal cancer Mother        Barrett's  . Heart attack Maternal Grandmother        after 65  . Dementia Maternal Grandmother        CVA  . Breast cancer Maternal Grandmother   . Diabetes Maternal Grandmother   . Stroke Maternal Grandmother        in 68s  . Heart attack Paternal Uncle 53  . Breast cancer Maternal Aunt   . Colon cancer Neg Hx   . Prostate cancer Neg Hx   . Colon polyps Neg Hx      Allergies as of 07/22/2017      Reactions   Aspirin    Angioedema in 1980s Medi Alert bracelet  is recommended   Nifedipine Rash   edema      Medication List        Accurate as  of 07/22/17 11:59 PM. Always use your most recent med list.          azelastine 0.1 % nasal spray Commonly known as:  ASTELIN Place 2 sprays into both nostrils at bedtime as needed for rhinitis. Use in  each nostril as directed   clopidogrel 75 MG tablet Commonly known as:  PLAVIX Take 1 tablet (75 mg total) by mouth daily.   famotidine 20 MG tablet Commonly known as:  PEPCID TAKE 1 TABLET BY MOUTH EVERY DAY   FLOVENT HFA 110 MCG/ACT inhaler Generic drug:  fluticasone ONE PUFF TWICE A DAY TO PREVENT COUGHING AND WHEEZING. RINSE, GARGLE AND SPIT OUT AFTER USE   fluorouracil 5 % cream Commonly known as:  EFUDEX Apply 1 application topically daily. Reported on 09/10/2015   fluticasone 50 MCG/ACT nasal spray Commonly known as:  FLONASE One spray each nostril twice a day if needed for stuffy nose.   metoprolol tartrate 25 MG tablet Commonly known as:  LOPRESSOR Take 0.5 tablets (12.5 mg total) by mouth 2 (two) times daily.   mometasone 50 MCG/ACT nasal spray Commonly known as:  NASONEX ONE SPRAY IN EACH NOSTRIL TWICE A DAY IF NEEDED FOR STUFFY NOSE   montelukast 10 MG tablet Commonly known as:  SINGULAIR ONE TABLET ONCE A DAY FOR COUGHING OR WHEEZING   nitroGLYCERIN 0.4 MG SL tablet Commonly known as:  NITROSTAT Place 1 tablet (0.4 mg total) under the tongue every 5 (five) minutes as needed for chest pain.   olmesartan-hydrochlorothiazide 20-12.5 MG tablet Commonly known as:  BENICAR HCT Take 1 tablet by mouth daily.   PROAIR HFA IN Inhale into the lungs.   rosuvastatin 40 MG tablet Commonly known as:  CRESTOR Take 1 tablet (40 mg total) by mouth daily.   tadalafil 5 MG tablet Commonly known as:  CIALIS Take 5 mg by mouth daily as needed for erectile dysfunction.          Objective:   Physical Exam BP 119/77 (BP Location: Right Arm, Patient Position: Sitting, Cuff Size: Large)   Pulse 67   Temp 98.8 F (37.1 C) (Oral)   Resp 16   Ht 5' 9.5" (1.765 m)    Wt 226 lb 12.8 oz (102.9 kg)   SpO2 99%   BMI 33.01 kg/m  General:   Well developed, well nourished . NAD.  Neck: No  thyromegaly  HEENT:  Normocephalic . Face symmetric, atraumatic. TMs normal Lungs:  CTA B Normal respiratory effort, no intercostal retractions, no accessory muscle use. Heart: RRR,  no murmur.  No pretibial edema bilaterally  Abdomen:  Not distended, soft, non-tender. No rebound or rigidity.    MSK: Hands and wrists without synovitis Skin: + Patches of erythematous skin on the inner aspect of the elbows Neurologic:  alert & oriented X3.  Speech normal, gait appropriate for age and unassisted Strength symmetric and appropriate for age.  Psych: Cognition and judgment appear intact.  Cooperative with normal attention span and concentration.  Behavior appropriate. No anxious or depressed appearing.     Assessment & Plan:   Assessment  Prediabetes HTN Hyperlipidemia CV: Dr Angelena Form --CAD --Stroke, seen in a MRI Asthma- Dr Darcey Nora  MSK --DJD knees used to see Dr Len Childs 2016, local injections --Spinal stenosis (neck-back) - Dr Cyndy Freeze dx ~ 2008 via MRI neck, back, had local injections 2008; then 04-2015 GERD Benign positional vertigo-- did PT before GU: --Elevated PSA -- Dr Jeffie Pollock --Peyronie's  Disease Skin cancer : [facility] Dr Susie Cassette   PLAN: Prediabetes, HTN, hyperlipidemia, CAD: Patient is asx, good compliance with Plavix, metoprolol, Benicar HCT, Crestor.  Last FLP satisfactory, checking labs. Fatigue, decreased libido, poor erections: Likely multifactorial including obesity, beta-blockers, etc.  At some point testosterone was a slightly low. Check a  TSH, free and total testosterone, H06, folic acid, vitamin D. Arthralgias: As described above, no fever chills headache or weight loss.  Low suspicious for PMR but will check a sed rate.  No synovitis on exam.  Consider hold Crestor reassess in 3 months Obesity: Recommend to stop chlorpheniramine which may  increase his appetite, information about the bariatric office provided. Otalgia: Continue Flonase, take Astelin only temporarily.  See instructions. RTC 3 months  Today, in addition to CPX I spent more than  20  min with the patient: >50% of the time counseling regards chronic conditions ane new issues, explained why some sxs are likely multifactorial and the fact that sed rate is not diagnostic of a particular dz but still useful

## 2017-07-23 ENCOUNTER — Other Ambulatory Visit (INDEPENDENT_AMBULATORY_CARE_PROVIDER_SITE_OTHER): Payer: PPO

## 2017-07-23 DIAGNOSIS — R5383 Other fatigue: Secondary | ICD-10-CM | POA: Diagnosis not present

## 2017-07-23 DIAGNOSIS — Z0000 Encounter for general adult medical examination without abnormal findings: Secondary | ICD-10-CM

## 2017-07-23 DIAGNOSIS — R739 Hyperglycemia, unspecified: Secondary | ICD-10-CM

## 2017-07-23 LAB — BASIC METABOLIC PANEL
BUN: 17 mg/dL (ref 6–23)
CHLORIDE: 102 meq/L (ref 96–112)
CO2: 26 mEq/L (ref 19–32)
Calcium: 9.9 mg/dL (ref 8.4–10.5)
Creatinine, Ser: 1.13 mg/dL (ref 0.40–1.50)
GFR: 68.31 mL/min (ref >60.00)
Glucose, Bld: 106 mg/dL — ABNORMAL HIGH (ref 70–99)
POTASSIUM: 3.6 meq/L (ref 3.5–5.1)
SODIUM: 138 meq/L (ref 135–145)

## 2017-07-23 LAB — B12 AND FOLATE PANEL
Folate: 19.2 ng/mL (ref >5.9)
Vitamin B-12: 460 pg/mL (ref 211–911)

## 2017-07-23 LAB — SEDIMENTATION RATE: Sed Rate: 12 mm/hr (ref 0–20)

## 2017-07-23 LAB — CBC
HEMATOCRIT: 44.4 % (ref 39.0–52.0)
HEMOGLOBIN: 15.2 g/dL (ref 13.0–17.0)
MCHC: 34.1 g/dL (ref 30.0–36.0)
MCV: 90.2 fl (ref 78.0–100.0)
PLATELETS: 174 10*3/uL (ref 150.0–400.0)
RBC: 4.92 Mil/uL (ref 4.22–5.81)
RDW: 13.1 % (ref 11.5–15.5)
WBC: 5.5 10*3/uL (ref 4.0–10.5)

## 2017-07-23 LAB — TSH: TSH: 2.38 u[IU]/mL (ref 0.35–4.50)

## 2017-07-23 LAB — HEMOGLOBIN A1C: HEMOGLOBIN A1C: 6.2 % (ref 4.6–6.5)

## 2017-07-24 NOTE — Assessment & Plan Note (Signed)
Prediabetes, HTN, hyperlipidemia, CAD: Patient is asx, good compliance with Plavix, metoprolol, Benicar HCT, Crestor.  Last FLP satisfactory, checking labs. Fatigue, decreased libido, poor erections: Likely multifactorial including obesity, beta-blockers, etc.  At some point testosterone was a slightly low. Check a TSH, free and total testosterone, N35, folic acid, vitamin D. Arthralgias: As described above, no fever chills headache or weight loss.  Low suspicious for PMR but will check a sed rate.  No synovitis on exam.  Consider hold Crestor reassess in 3 months Obesity: Recommend to stop chlorpheniramine which may increase his appetite, information about the bariatric office provided. Otalgia: Continue Flonase, take Astelin only temporarily.  See instructions. RTC 3 months

## 2017-07-25 LAB — TESTOSTERONE TOTAL,FREE,BIO, MALES
ALBUMIN MSPROF: 4.4 g/dL (ref 3.6–5.1)
SEX HORMONE BINDING: 18 nmol/L — AB (ref 22–77)
Testosterone: 243 ng/dL — ABNORMAL LOW (ref 250–827)

## 2017-07-25 LAB — VITAMIN D 1,25 DIHYDROXY
VITAMIN D 1, 25 (OH) TOTAL: 19 pg/mL (ref 18–72)
Vitamin D3 1, 25 (OH)2: 19 pg/mL

## 2017-07-30 ENCOUNTER — Other Ambulatory Visit: Payer: Self-pay | Admitting: Pediatrics

## 2017-07-30 ENCOUNTER — Other Ambulatory Visit: Payer: Self-pay | Admitting: Internal Medicine

## 2017-07-30 NOTE — Telephone Encounter (Signed)
RF for Nasonex x 1 with 5 ref[facility] at [facility]

## 2017-08-04 ENCOUNTER — Other Ambulatory Visit: Payer: Self-pay

## 2017-08-04 NOTE — Progress Notes (Signed)
testo 

## 2017-08-05 ENCOUNTER — Other Ambulatory Visit: Payer: Self-pay | Admitting: Internal Medicine

## 2017-08-11 DIAGNOSIS — Z6832 Body mass index (BMI) 32.0-32.9, adult: Secondary | ICD-10-CM | POA: Diagnosis not present

## 2017-08-11 DIAGNOSIS — I10 Essential (primary) hypertension: Secondary | ICD-10-CM | POA: Diagnosis not present

## 2017-08-11 DIAGNOSIS — M48062 Spinal stenosis, lumbar region with neurogenic claudication: Secondary | ICD-10-CM | POA: Diagnosis not present

## 2017-08-13 ENCOUNTER — Other Ambulatory Visit: Payer: Self-pay | Admitting: Internal Medicine

## 2017-09-22 DIAGNOSIS — M48062 Spinal stenosis, lumbar region with neurogenic claudication: Secondary | ICD-10-CM | POA: Diagnosis not present

## 2017-09-22 DIAGNOSIS — Z6832 Body mass index (BMI) 32.0-32.9, adult: Secondary | ICD-10-CM | POA: Diagnosis not present

## 2017-09-22 DIAGNOSIS — I10 Essential (primary) hypertension: Secondary | ICD-10-CM | POA: Diagnosis not present

## 2017-09-23 ENCOUNTER — Other Ambulatory Visit: Payer: Self-pay | Admitting: Pediatrics

## 2017-09-24 ENCOUNTER — Other Ambulatory Visit (HOSPITAL_BASED_OUTPATIENT_CLINIC_OR_DEPARTMENT_OTHER): Payer: Self-pay | Admitting: Neurosurgery

## 2017-09-24 DIAGNOSIS — M48062 Spinal stenosis, lumbar region with neurogenic claudication: Secondary | ICD-10-CM

## 2017-09-26 ENCOUNTER — Ambulatory Visit (HOSPITAL_BASED_OUTPATIENT_CLINIC_OR_DEPARTMENT_OTHER)
Admission: RE | Admit: 2017-09-26 | Discharge: 2017-09-26 | Disposition: A | Discharge status: Home / Self Care | Payer: PPO | Source: Ambulatory Visit | Attending: Neurosurgery | Admitting: Neurosurgery

## 2017-09-26 DIAGNOSIS — M48061 Spinal stenosis, lumbar region without neurogenic claudication: Secondary | ICD-10-CM | POA: Diagnosis not present

## 2017-09-26 DIAGNOSIS — M48062 Spinal stenosis, lumbar region with neurogenic claudication: Secondary | ICD-10-CM

## 2017-09-26 DIAGNOSIS — M545 Low back pain: Secondary | ICD-10-CM | POA: Diagnosis not present

## 2017-09-26 DIAGNOSIS — M5126 Other intervertebral disc displacement, lumbar region: Secondary | ICD-10-CM | POA: Insufficient documentation

## 2017-09-28 ENCOUNTER — Other Ambulatory Visit: Payer: Self-pay

## 2017-09-28 DIAGNOSIS — R7989 Other specified abnormal findings of blood chemistry: Secondary | ICD-10-CM

## 2017-09-29 ENCOUNTER — Other Ambulatory Visit (INDEPENDENT_AMBULATORY_CARE_PROVIDER_SITE_OTHER): Payer: PPO

## 2017-09-29 DIAGNOSIS — R7989 Other specified abnormal findings of blood chemistry: Secondary | ICD-10-CM | POA: Diagnosis not present

## 2017-09-30 LAB — TESTOSTERONE TOTAL,FREE,BIO, MALES
Albumin: 4.3 g/dL (ref 3.6–5.1)
Sex Hormone Binding: 18 nmol/L — ABNORMAL LOW (ref 22–77)
TESTOSTERONE FREE: 50.7 pg/mL (ref 46.0–224.0)
Testosterone, Bioavailable: 99.8 ng/dL — ABNORMAL LOW (ref >=110.0)
Testosterone: 255 ng/dL (ref 250–827)

## 2017-10-02 ENCOUNTER — Other Ambulatory Visit: Payer: Self-pay | Admitting: Internal Medicine

## 2017-10-13 DIAGNOSIS — M48062 Spinal stenosis, lumbar region with neurogenic claudication: Secondary | ICD-10-CM | POA: Diagnosis not present

## 2017-10-13 DIAGNOSIS — Z6832 Body mass index (BMI) 32.0-32.9, adult: Secondary | ICD-10-CM | POA: Diagnosis not present

## 2017-10-14 ENCOUNTER — Other Ambulatory Visit: Payer: Self-pay | Admitting: Neurosurgery

## 2017-10-14 ENCOUNTER — Telehealth: Payer: Self-pay | Admitting: *Deleted

## 2017-10-14 NOTE — Telephone Encounter (Signed)
   [facility] Pre-operative Risk Assessment    Request for surgical clearance:  1. What type of surgery is being performed? L4-5 Posterior Lumbar Fusion with L2-3 Discectomy  2. When is this surgery scheduled? 10/21/2017 and 12/11/2017  3. What type of clearance is required (medical clearance vs. Pharmacy clearance to hold med vs. Both)? Both  4. Are there any medications that need to be held prior to surgery and how long? Pending Review from Provider  5. Practice name and name of physician performing surgery? [facility], Dr Ashok Pall  6. What is your office phone number [phone]    7.   What is your office fax number [phone]  8.   Anesthesia type (None, local, MAC, general) ? Not Stated  George Alvarez 10/14/2017, 5:56 PM  _________________________________________________________________   (provider comments below)

## 2017-10-15 NOTE — Telephone Encounter (Signed)
Spoke with Janett Billow, surgery scheduler for Dr. Christella Noa who advised that request is for patient to hold Plavix x 5 days prior to surgery. I thanked her for the call and advised that clearance will be routed back to their office after completion

## 2017-10-15 NOTE — Pre-Procedure Instructions (Signed)
George Alvarez  10/15/2017      [id number] [place], [place] [address] [place] [address] Phone: [phone] Fax: [phone]  [id number] - [place], [place] - [address] [place] [address] Phone: [phone] Fax: [phone]    Your procedure is scheduled on Wed., October 21, 2017 from 2:52PM-7:26PM  Report to [facility] Admitting Entrance "A" at 12:50PM  Call this number if you have problems the morning of surgery:  [phone]   Remember:  Do not eat or drink after midnight on July 17th    Take these medicines the morning of surgery with A SIP OF WATER: Metoprolol tartrate (LOPRESSOR)  Fluticasone (FLONASE)  If needed Albuterol Inhaler (Bring with you the day of surgery)   If needed NitroGLYCERIN (NITROSTAT) (Inform the nurse if you had to take this medicine)  Follow your doctors instructions regarding your Plavix.  If no instructions were given by your doctor, then you will need to call the prescribing office office to get instructions.    As of today, stop taking all Other Aspirin Products, Vitamins, Fish oils, and Herbal medications. Also stop all NSAIDS i.e. Advil, Ibuprofen, Motrin, Aleve, Anaprox, Naproxen, BC, Goody Powders, and all Supplements.    Do not wear jewelry.  Do not wear lotions, powders, colognes, or deodorant.  Do not shave 48 hours prior to surgery.  Men may shave face.  Do not bring valuables to the hospital.  [facility] is not responsible for any belongings or valuables.  Contacts, dentures or bridgework may not be worn into surgery.  Leave your suitcase in the car.  After surgery it may be brought to your room.  For patients admitted to the hospital, discharge time will be determined by your treatment team.  Patients discharged the day of surgery will not be allowed to drive home.   Special instructions:  [facility]- Preparing  For Surgery  Before surgery, you can play an important role. Because skin is not sterile, your skin needs to be as free of germs as possible. You can reduce the number of germs on your skin by washing with CHG (chlorahexidine gluconate) Soap before surgery.  CHG is an antiseptic cleaner which kills germs and bonds with the skin to continue killing germs even after washing.    Oral Hygiene is also important to reduce your risk of infection.  Remember - BRUSH YOUR TEETH THE MORNING OF SURGERY WITH YOUR REGULAR TOOTHPASTE  Please do not use if you have an allergy to CHG or antibacterial soaps. If your skin becomes reddened/irritated stop using the CHG.  Do not shave (including legs and underarms) for at least 48 hours prior to first CHG shower. It is OK to shave your face.  Please follow these instructions carefully.   1. Shower the NIGHT BEFORE SURGERY and the MORNING OF SURGERY with CHG.   2. If you chose to wash your hair, wash your hair first as usual with your normal shampoo.  3. After you shampoo, rinse your hair and body thoroughly to remove the shampoo.  4. Use CHG as you would any other liquid soap. You can apply CHG directly to the skin and wash gently with a scrungie or a clean washcloth.   5. Apply the CHG Soap to your body ONLY FROM THE NECK DOWN.  Do not use on open wounds or open sores. Avoid contact with  your eyes, ears, mouth and genitals (private parts). Wash Face and genitals (private parts)  with your normal soap.  6. Wash thoroughly, paying special attention to the area where your surgery will be performed.  7. Thoroughly rinse your body with warm water from the neck down.  8. DO NOT shower/wash with your normal soap after using and rinsing off the CHG Soap.  9. Pat yourself dry with a CLEAN TOWEL.  10. Wear CLEAN PAJAMAS to bed the night before surgery, wear comfortable clothes the morning of surgery  11. Place CLEAN SHEETS on your bed the night of your first shower  and DO NOT SLEEP WITH PETS.  Day of Surgery:  Do not apply any deodorants/lotions.  Please wear clean clothes to the hospital/surgery center.   Remember to brush your teeth WITH YOUR REGULAR TOOTHPASTE.  Please read over the following fact sheets that you were given. Pain Booklet, Coughing and Deep Breathing, MRSA Information and Surgical Site Infection Prevention

## 2017-10-15 NOTE — Telephone Encounter (Signed)
Left detailed message for Dr. Lacy Duverney surgery scheduler to get more information regarding plavix - can patient have surgery while still on plavix and if not, how long does Dr. Christella Noa request patient to hold

## 2017-10-15 NOTE — Telephone Encounter (Signed)
Dr. Angelena Form, this pt has hx of cath 02/06/12 with 95% proximal OM2 lesion and a 60% mid RCA lesion. EF was 65%. His OM2 was very large in caliber and was treated with bare-metal stents x 2. Medical management of RCA stenosis which was felt to be moderate. He has been on Plavix.  Normal stress myoview December 2015.  Can plavix be held for lumbar fusion and discectomy?  Please route response back to [facility] DID PREOP  Thanks

## 2017-10-16 ENCOUNTER — Encounter (HOSPITAL_COMMUNITY)
Admission: RE | Admit: 2017-10-16 | Discharge: 2017-10-16 | Disposition: A | Discharge status: Home / Self Care | Payer: PPO | Source: Ambulatory Visit | Attending: Neurosurgery | Admitting: Neurosurgery

## 2017-10-16 ENCOUNTER — Encounter (HOSPITAL_COMMUNITY): Payer: Self-pay

## 2017-10-16 ENCOUNTER — Other Ambulatory Visit: Payer: Self-pay

## 2017-10-16 DIAGNOSIS — K219 Gastro-esophageal reflux disease without esophagitis: Secondary | ICD-10-CM | POA: Diagnosis not present

## 2017-10-16 DIAGNOSIS — I10 Essential (primary) hypertension: Secondary | ICD-10-CM | POA: Diagnosis not present

## 2017-10-16 DIAGNOSIS — Z8673 Personal history of transient ischemic attack (TIA), and cerebral infarction without residual deficits: Secondary | ICD-10-CM | POA: Insufficient documentation

## 2017-10-16 DIAGNOSIS — I2510 Atherosclerotic heart disease of native coronary artery without angina pectoris: Secondary | ICD-10-CM | POA: Insufficient documentation

## 2017-10-16 DIAGNOSIS — M5126 Other intervertebral disc displacement, lumbar region: Secondary | ICD-10-CM | POA: Insufficient documentation

## 2017-10-16 DIAGNOSIS — Z01812 Encounter for preprocedural laboratory examination: Secondary | ICD-10-CM | POA: Insufficient documentation

## 2017-10-16 HISTORY — DX: Unspecified osteoarthritis, unspecified site: M19.90

## 2017-10-16 HISTORY — DX: Personal history of urinary calculi: Z87.442

## 2017-10-16 LAB — TYPE AND SCREEN
ABO/RH(D): A POS
ANTIBODY SCREEN: NEGATIVE

## 2017-10-16 LAB — BASIC METABOLIC PANEL
ANION GAP: 8 (ref 5–15)
BUN: 19 mg/dL (ref 8–23)
CALCIUM: 9.5 mg/dL (ref 8.9–10.3)
CO2: 25 mmol/L (ref 22–32)
CREATININE: 1.02 mg/dL (ref 0.61–1.24)
Chloride: 107 mmol/L (ref 98–111)
GFR calc Af Amer: >60 mL/min (ref >60)
Glucose, Bld: 130 mg/dL — ABNORMAL HIGH (ref 70–99)
Potassium: 4.3 mmol/L (ref 3.5–5.1)
Sodium: 140 mmol/L (ref 135–145)

## 2017-10-16 LAB — SURGICAL PCR SCREEN
MRSA, PCR: NEGATIVE
Staphylococcus aureus: POSITIVE — AB

## 2017-10-16 LAB — CBC
HCT: 44.1 % (ref 39.0–52.0)
Hemoglobin: 14.9 g/dL (ref 13.0–17.0)
MCH: 30.7 pg (ref 26.0–34.0)
MCHC: 33.8 g/dL (ref 30.0–36.0)
MCV: 90.9 fL (ref 78.0–100.0)
PLATELETS: 139 10*3/uL — AB (ref 150–400)
RBC: 4.85 MIL/uL (ref 4.22–5.81)
RDW: 12.3 % (ref 11.5–15.5)
WBC: 6.3 10*3/uL (ref 4.0–10.5)

## 2017-10-16 LAB — ABO/RH: ABO/RH(D): A POS

## 2017-10-16 NOTE — Telephone Encounter (Signed)
   Primary Cardiologist: Lauree Chandler, MD  Chart reviewed as part of pre-operative protocol coverage. Patient was contacted 10/16/2017 in reference to pre-operative risk assessment for pending surgery as outlined below.  George Alvarez was last seen on 01/19/17 by Dr. Angelena Form.  Since that day, George Alvarez has done well. He is active golfing, swimming and walking without any exertional chest pain/pressure/tightness or shortness of breath. No lightheadedness palpitations or syncope.  Therefore, based on ACC/AHA guidelines, the patient would be at acceptable risk for the planned procedure without further cardiovascular testing.   Per Dr. Angelena Form, Plavix can be held for the procedure.   I will route this recommendation to the requesting party via Epic fax function and remove from pre-op pool.  Please call with questions.  Daune Perch, NP 10/16/2017, 12:31 PM

## 2017-10-16 NOTE — Telephone Encounter (Signed)
Plavix can be held for the procedure. Thanks, chris

## 2017-10-16 NOTE — Progress Notes (Signed)
   10/16/17 0942  OBSTRUCTIVE SLEEP APNEA  Have you ever been diagnosed with sleep apnea through a sleep study? No  Do you snore loudly (loud enough to be heard through closed doors)?  1  Do you often feel tired, fatigued, or sleepy during the daytime (such as falling asleep during driving or talking to someone)? 0  Has anyone observed you stop breathing during your sleep? 0  Do you have, or are you being treated for high blood pressure? 1  BMI more than 35 kg/m2? 0  Age > 50 (1-yes) 1  Neck circumference greater than:Male 16 inches or larger, Male 17inches or larger? 1  Male Gender (Yes=1) 1  Obstructive Sleep Apnea Score 5  Score 5 or greater  Results sent to PCP

## 2017-10-19 ENCOUNTER — Other Ambulatory Visit: Payer: Self-pay | Admitting: Pediatrics

## 2017-10-19 DIAGNOSIS — L570 Actinic keratosis: Secondary | ICD-10-CM | POA: Diagnosis not present

## 2017-10-19 DIAGNOSIS — Z85828 Personal history of other malignant neoplasm of skin: Secondary | ICD-10-CM | POA: Diagnosis not present

## 2017-10-19 DIAGNOSIS — D485 Neoplasm of uncertain behavior of skin: Secondary | ICD-10-CM | POA: Diagnosis not present

## 2017-10-19 DIAGNOSIS — L218 Other seborrheic dermatitis: Secondary | ICD-10-CM | POA: Diagnosis not present

## 2017-10-19 DIAGNOSIS — Z08 Encounter for follow-up examination after completed treatment for malignant neoplasm: Secondary | ICD-10-CM | POA: Diagnosis not present

## 2017-10-19 NOTE — Progress Notes (Signed)
Anesthesia Chart Review:   Case:  [id number] Date/Time:  10/21/17 1437   Procedure:  Lumbar 4-5 Posterior lumbar interbody fusion with a Lumbar 2-3 Discectomy (N/A ) - Lumbar 4-5 Posterior lumbar interbody fusion with a Lumbar 2-3 Discectomy   Anesthesia type:  General   Pre-op diagnosis:  Lumbar herniated disc; Spondylolisthesis   Location:  [facility] OR ROOM 21 / [place] [facility]   Surgeon:  Ashok Pall, MD      DISCUSSION - Pt is a 72 year old male with hx CAD (BMS x2 to [place] 2013), HTN, stroke  - Pt has cardiac clearance for surgery  - Last dose plavix 10/13/17   VS: BP 135/76   Pulse 64   Temp 36.8 C   Resp 20   Ht 5\' 10"  (1.778 m)   Wt 225 lb 4.8 oz (102.2 kg)   SpO2 97%   BMI 32.33 kg/m    PROVIDERS: PCP is Colon Branch, MD  Cardiologist is Lauree Chandler, MD. Last office visit 01/19/17. Pt cleared for surgery by Daune Perch, NP on 10/14/17    LABS: Labs reviewed: Acceptable for surgery. (all labs ordered are listed, but only abnormal results are displayed)  Labs Reviewed  SURGICAL PCR SCREEN - Abnormal; Notable for the following components:      Result Value   Staphylococcus aureus POSITIVE (*)    All other components within normal limits  CBC - Abnormal; Notable for the following components:   Platelets 139 (*)    All other components within normal limits  BASIC METABOLIC PANEL - Abnormal; Notable for the following components:   Glucose, Bld 130 (*)    All other components within normal limits  TYPE AND SCREEN  ABO/RH    EKG 01/19/17: NSR. LAFB.    CV:  Nuclear stress test 03/28/14:  - Low risk stress nuclear study Normal perfusion images ECG positive in recovery may be related to HTN response. - LV Ejection Fraction: 63%.  LV Wall Motion:  NL LV Function; NL Wall Motion  Cardiac cath 02/06/12: - LM: 10% distal stenosis.  - LAD: Large caliber vessel that courses to the apex with mild luminal irregularities proximal and distal.  - CX:  Very large caliber  vessel. The first OM is small and has an ostial 40% stenosis. The second OM is very large (5.51mm) and has a 95% stenosis in the proximal portion of the vessel.  - RCA: Large, dominant artery with 30% proximal stenosis, 60% mid stenosis, diffuse 30% distal disease. The PDA and PLA are patent with mild plaque disease.  - LVAngiogram: LVEF=65%  - Impression:  1. Double vessel CAD  2. Unstable angina  3. Normal LV systolic function  4. Successful PTCA/bare metal stent x 1 OM2.    Past Medical History:  Diagnosis Date  . Anal fissure   . Arthritis   . Asthma, chronic 06/16/2007   Qualifier: Diagnosis of  By: Linna Darner MD, Gwyndolyn Saxon   Onset:as child Triggers (environmental, infectious, allergic): all, mainly environmental triggers Rescue inhaler TOI:ZTIWPY Maintenance medications/ response:Singulair,Qvar Smoking history:never Family history pulmonary disease: no    . Basal cell carcinoma of chest wall 2016   1.8 cm, treated with electrodessication and currettage  . Benign paroxysmal positional vertigo 11/19/2012   Diagn[facility]  Physical therapy appointment pending   . BPH (benign prostatic hyperplasia)    With urinary obstruction  . Coronary artery disease    a. 02/2012 Cath/PCI: LM 10, LAD min irregs,  LCX large, OM1 sm, 40 ost, OM2 95p (5.0x16 Veriflex & 4.5x12 Veriflex BMS'), RCA 30p, 44m, 30d, PDA/PLA min irregs, EF 65%  . Diverticulosis   . Erectile dysfunction due to arterial insufficiency   . Fundic gland polyps of stomach, benign 2010  . GERD (gastroesophageal reflux disease)   . History of elevated PSA   . History of kidney stones   . Hyperlipidemia   . Hypertension   . Nocturia   . Perennial allergic rhinitis   . Peyronie's disease   . Skin cancer    Basal and squamous cell cancers, greater than 20  . Stroke ([place]) 2016  . Tubular adenoma of colon 2010    Past Surgical History:  Procedure Laterality Date  . COLONOSCOPY  2003, 2011   negative  .  CORONARY ANGIOPLASTY WITH STENT PLACEMENT  02/06/2012   OM2  bare metal   . EPIDURAL BLOCK INJECTION  04-2015   Dr Brien Few  . epidural steroids  2007, 2009   X 2 @ cervical &, lumbar)  . HERNIA REPAIR    . INTRAVASCULAR ULTRASOUND  02/06/2012   Procedure: INTRAVASCULAR ULTRASOUND;  Surgeon: [name], MD;  Location: [facility] C[facility] LAB;  Service: Cardiovascular;;  . LEFT HEART CATHETERIZATION WITH CORONARY ANGIOGRAM N/A 02/06/2012   Procedure: LEFT HEART CATHETERIZATION WITH CORONARY ANGIOGRAM;  Surgeon: [name], MD;  Location: [facility] CATH LAB;  Service: Cardiovascular;  Laterality: N/A;  . PERCUTANEOUS CORONARY STENT INTERVENTION (PCI-S)  02/06/2012   Procedure: PERCUTANEOUS CORONARY STENT INTERVENTION (PCI-S);  Surgeon: [name], MD;  Location: [facility] CATH LAB;  Service: Cardiovascular;;  . septoplasty    . TONSILLECTOMY AND ADENOIDECTOMY    . TRIGGER FINGER RELEASE    . UPPER GASTROINTESTINAL ENDOSCOPY  2011   gastric polyps  . VASECTOMY      MEDICATIONS: . albuterol (PROVENTIL HFA;VENTOLIN HFA) 108 (90 Base) MCG/ACT inhaler  . azelastine (ASTELIN) 0.1 % nasal spray  . cholecalciferol (VITAMIN D) 1000 units tablet  . clopidogrel (PLAVIX) 75 MG tablet  . famotidine (PEPCID) 20 MG tablet  . FLOVENT HFA 110 MCG/ACT inhaler  . fluticasone (FLONASE) 50 MCG/ACT nasal spray  . hydrocortisone 2.5 % cream  . ketoconazole (NIZORAL) 2 % cream  . metoprolol tartrate (LOPRESSOR) 25 MG tablet  . mometasone (NASONEX) 50 MCG/ACT nasal spray  . montelukast (SINGULAIR) 10 MG tablet  . montelukast (SINGULAIR) 10 MG tablet  . Multiple Vitamin (MULTIVITAMIN WITH MINERALS) TABS tablet  . nitroGLYCERIN (NITROSTAT) 0.4 MG SL tablet  . olmesartan-hydrochlorothiazide (BENICAR HCT) 20-12.5 MG tablet  . Omega-3 Fatty Acids (FISH OIL) 1200 MG CAPS  . rosuvastatin (CRESTOR) 40 MG tablet  . tadalafil (CIALIS) 5 MG tablet  . triamcinolone cream (KENALOG) 0.1 %   No current  facility-administered medications for this encounter.    - Last dose plavix 10/13/17   If no changes, I anticipate pt can proceed with surgery as scheduled.   Willeen Cass, FNP-BC [facility]/Anesthesiology Phone: [phone] 10/19/2017 10:51 AM

## 2017-10-21 ENCOUNTER — Inpatient Hospital Stay (HOSPITAL_COMMUNITY): Payer: PPO | Admitting: Anesthesiology

## 2017-10-21 ENCOUNTER — Inpatient Hospital Stay (HOSPITAL_COMMUNITY)
Admission: RE | Disposition: A | Discharge status: Home Health | Payer: Self-pay | Source: Ambulatory Visit | Attending: Neurosurgery

## 2017-10-21 ENCOUNTER — Encounter (HOSPITAL_COMMUNITY): Payer: Self-pay

## 2017-10-21 ENCOUNTER — Inpatient Hospital Stay (HOSPITAL_COMMUNITY)
Admission: RE | Admit: 2017-10-21 | Discharge: 2017-10-27 | DRG: 520 | Disposition: A | Discharge status: Home Health | Payer: PPO | Source: Ambulatory Visit | Attending: Neurosurgery | Admitting: Neurosurgery

## 2017-10-21 ENCOUNTER — Inpatient Hospital Stay (HOSPITAL_COMMUNITY): Payer: PPO | Admitting: Emergency Medicine

## 2017-10-21 ENCOUNTER — Inpatient Hospital Stay (HOSPITAL_COMMUNITY): Payer: PPO

## 2017-10-21 DIAGNOSIS — Z419 Encounter for procedure for purposes other than remedying health state, unspecified: Secondary | ICD-10-CM

## 2017-10-21 DIAGNOSIS — R51 Headache: Secondary | ICD-10-CM | POA: Diagnosis not present

## 2017-10-21 DIAGNOSIS — M5126 Other intervertebral disc displacement, lumbar region: Principal | ICD-10-CM | POA: Diagnosis present

## 2017-10-21 DIAGNOSIS — R3911 Hesitancy of micturition: Secondary | ICD-10-CM | POA: Diagnosis not present

## 2017-10-21 DIAGNOSIS — I10 Essential (primary) hypertension: Secondary | ICD-10-CM | POA: Diagnosis not present

## 2017-10-21 DIAGNOSIS — M4316 Spondylolisthesis, lumbar region: Secondary | ICD-10-CM | POA: Diagnosis not present

## 2017-10-21 DIAGNOSIS — I2510 Atherosclerotic heart disease of native coronary artery without angina pectoris: Secondary | ICD-10-CM | POA: Diagnosis present

## 2017-10-21 DIAGNOSIS — Z955 Presence of coronary angioplasty implant and graft: Secondary | ICD-10-CM

## 2017-10-21 DIAGNOSIS — K219 Gastro-esophageal reflux disease without esophagitis: Secondary | ICD-10-CM | POA: Diagnosis not present

## 2017-10-21 DIAGNOSIS — M4326 Fusion of spine, lumbar region: Secondary | ICD-10-CM | POA: Diagnosis not present

## 2017-10-21 DIAGNOSIS — N401 Enlarged prostate with lower urinary tract symptoms: Secondary | ICD-10-CM | POA: Diagnosis not present

## 2017-10-21 HISTORY — PX: LUMBAR LAMINECTOMY/DECOMPRESSION MICRODISCECTOMY: SHX5026

## 2017-10-21 SURGERY — LUMBAR LAMINECTOMY/DECOMPRESSION MICRODISCECTOMY 2 LEVELS
Anesthesia: General | Site: Spine Lumbar

## 2017-10-21 MED ORDER — SODIUM CHLORIDE 0.9 % IV SOLN: 250.0000 mL | INTRAVENOUS | Status: DC

## 2017-10-21 MED ORDER — LIDOCAINE 2% (20 MG/ML) 5 ML SYRINGE
INTRAMUSCULAR | Status: DC | PRN
  Administered 2017-10-21: 60 mg via INTRAVENOUS

## 2017-10-21 MED ORDER — ZOLPIDEM TARTRATE 5 MG PO TABS
5.0000 mg | ORAL_TABLET | Freq: Every evening | ORAL | Status: DC | PRN
  Administered 2017-10-24 – 2017-10-25 (×2): 5 mg via ORAL
  Filled 2017-10-21 (×2): qty 1

## 2017-10-21 MED ORDER — PROPOFOL 10 MG/ML IV BOLUS
INTRAVENOUS | Status: DC | PRN
  Administered 2017-10-21: 130 mg via INTRAVENOUS

## 2017-10-21 MED ORDER — FLUTICASONE PROPIONATE 50 MCG/ACT NA SUSP: 1.0000 | Freq: Every day | NASAL | Status: DC

## 2017-10-21 MED ORDER — SUGAMMADEX SODIUM 200 MG/2ML IV SOLN
INTRAVENOUS | Status: AC
  Filled 2017-10-21: qty 2

## 2017-10-21 MED ORDER — DIAZEPAM 5 MG PO TABS
5.0000 mg | ORAL_TABLET | Freq: Four times a day (QID) | ORAL | Status: DC | PRN
  Administered 2017-10-21 – 2017-10-24 (×5): 5 mg via ORAL
  Filled 2017-10-21 (×4): qty 1

## 2017-10-21 MED ORDER — FENTANYL CITRATE (PF) 100 MCG/2ML IJ SOLN
INTRAMUSCULAR | Status: DC | PRN
  Administered 2017-10-21: 100 ug via INTRAVENOUS

## 2017-10-21 MED ORDER — SENNOSIDES-DOCUSATE SODIUM 8.6-50 MG PO TABS
1.0000 | ORAL_TABLET | Freq: Every evening | ORAL | Status: DC | PRN
  Administered 2017-10-25: 1 via ORAL
  Filled 2017-10-21: qty 1

## 2017-10-21 MED ORDER — ONDANSETRON HCL 4 MG/2ML IJ SOLN: 4.0000 mg | Freq: Four times a day (QID) | INTRAMUSCULAR | Status: DC | PRN

## 2017-10-21 MED ORDER — ACETAMINOPHEN 500 MG PO TABS
1000.0000 mg | ORAL_TABLET | Freq: Four times a day (QID) | ORAL | Status: AC
  Administered 2017-10-22 (×3): 1000 mg via ORAL
  Filled 2017-10-21 (×3): qty 2

## 2017-10-21 MED ORDER — OXYCODONE HCL 5 MG PO TABS
5.0000 mg | ORAL_TABLET | ORAL | Status: DC | PRN
  Administered 2017-10-21: 5 mg via ORAL

## 2017-10-21 MED ORDER — BUPIVACAINE HCL (PF) 0.5 % IJ SOLN
INTRAMUSCULAR | Status: AC
  Filled 2017-10-21: qty 30

## 2017-10-21 MED ORDER — HYDROMORPHONE HCL 1 MG/ML IJ SOLN
0.2500 mg | INTRAMUSCULAR | Status: DC | PRN
  Administered 2017-10-21 (×2): 0.5 mg via INTRAVENOUS

## 2017-10-21 MED ORDER — THROMBIN (RECOMBINANT) 20000 UNITS EX SOLR
CUTANEOUS | Status: AC
  Filled 2017-10-21: qty 20000

## 2017-10-21 MED ORDER — SODIUM CHLORIDE 0.9 % IV SOLN
INTRAVENOUS | Status: DC | PRN
  Administered 2017-10-21: 50 ug/min via INTRAVENOUS

## 2017-10-21 MED ORDER — OXYCODONE HCL ER 10 MG PO T12A
10.0000 mg | EXTENDED_RELEASE_TABLET | Freq: Two times a day (BID) | ORAL | Status: DC
  Administered 2017-10-21 – 2017-10-27 (×12): 10 mg via ORAL
  Filled 2017-10-21 (×12): qty 1

## 2017-10-21 MED ORDER — ROCURONIUM BROMIDE 10 MG/ML (PF) SYRINGE
PREFILLED_SYRINGE | INTRAVENOUS | Status: AC
  Filled 2017-10-21: qty 10

## 2017-10-21 MED ORDER — PHENYLEPHRINE 40 MCG/ML (10ML) SYRINGE FOR IV PUSH (FOR BLOOD PRESSURE SUPPORT)
PREFILLED_SYRINGE | INTRAVENOUS | Status: DC | PRN
  Administered 2017-10-21: 120 ug via INTRAVENOUS
  Administered 2017-10-21 (×2): 80 ug via INTRAVENOUS
  Administered 2017-10-21: 40 ug via INTRAVENOUS

## 2017-10-21 MED ORDER — ACETAMINOPHEN 325 MG PO TABS
650.0000 mg | ORAL_TABLET | ORAL | Status: DC | PRN
  Administered 2017-10-24: 650 mg via ORAL
  Filled 2017-10-21: qty 2

## 2017-10-21 MED ORDER — OMEGA-3-ACID ETHYL ESTERS 1 G PO CAPS
1.0000 g | ORAL_CAPSULE | Freq: Every day | ORAL | Status: DC
  Administered 2017-10-22 – 2017-10-27 (×6): 1 g via ORAL
  Filled 2017-10-21 (×6): qty 1

## 2017-10-21 MED ORDER — OLMESARTAN MEDOXOMIL-HCTZ 20-12.5 MG PO TABS: 1.0000 | ORAL_TABLET | Freq: Every day | ORAL | Status: DC

## 2017-10-21 MED ORDER — 0.9 % SODIUM CHLORIDE (POUR BTL) OPTIME
TOPICAL | Status: DC | PRN
  Administered 2017-10-21: 1000 mL

## 2017-10-21 MED ORDER — CHLORHEXIDINE GLUCONATE CLOTH 2 % EX PADS: 6.0000 | MEDICATED_PAD | Freq: Once | CUTANEOUS | Status: DC

## 2017-10-21 MED ORDER — PHENYLEPHRINE HCL 10 MG/ML IJ SOLN
INTRAMUSCULAR | Status: AC
  Filled 2017-10-21: qty 1

## 2017-10-21 MED ORDER — MONTELUKAST SODIUM 10 MG PO TABS
10.0000 mg | ORAL_TABLET | Freq: Every day | ORAL | Status: DC
  Administered 2017-10-21 – 2017-10-26 (×6): 10 mg via ORAL
  Filled 2017-10-21 (×6): qty 1

## 2017-10-21 MED ORDER — ONDANSETRON HCL 4 MG/2ML IJ SOLN
INTRAMUSCULAR | Status: DC | PRN
  Administered 2017-10-21: 4 mg via INTRAVENOUS

## 2017-10-21 MED ORDER — PROMETHAZINE HCL 25 MG/ML IJ SOLN: 6.2500 mg | INTRAMUSCULAR | Status: DC | PRN

## 2017-10-21 MED ORDER — HYDROCORTISONE 1 % EX OINT
1.0000 "application " | TOPICAL_OINTMENT | Freq: Two times a day (BID) | CUTANEOUS | Status: DC
  Administered 2017-10-21 – 2017-10-25 (×6): 1 via TOPICAL
  Filled 2017-10-21: qty 28

## 2017-10-21 MED ORDER — CEFAZOLIN SODIUM-DEXTROSE 2-3 GM-%(50ML) IV SOLR
INTRAVENOUS | Status: DC | PRN
  Administered 2017-10-21: 2 g via INTRAVENOUS

## 2017-10-21 MED ORDER — FLUTICASONE PROPIONATE 50 MCG/ACT NA SUSP
1.0000 | Freq: Every day | NASAL | Status: DC
  Administered 2017-10-22 – 2017-10-27 (×6): 1 via NASAL
  Filled 2017-10-21: qty 16

## 2017-10-21 MED ORDER — SUGAMMADEX SODIUM 200 MG/2ML IV SOLN
INTRAVENOUS | Status: DC | PRN
  Administered 2017-10-21: 200 mg via INTRAVENOUS

## 2017-10-21 MED ORDER — ROCURONIUM BROMIDE 10 MG/ML (PF) SYRINGE
PREFILLED_SYRINGE | INTRAVENOUS | Status: DC | PRN
  Administered 2017-10-21: 50 mg via INTRAVENOUS

## 2017-10-21 MED ORDER — ALBUTEROL SULFATE HFA 108 (90 BASE) MCG/ACT IN AERS: 2.0000 | INHALATION_SPRAY | Freq: Four times a day (QID) | RESPIRATORY_TRACT | Status: DC | PRN

## 2017-10-21 MED ORDER — MONTELUKAST SODIUM 10 MG PO TABS: 10.0000 mg | ORAL_TABLET | Freq: Every day | ORAL | Status: DC

## 2017-10-21 MED ORDER — MAGNESIUM CITRATE PO SOLN: 1.0000 | Freq: Once | ORAL | Status: DC | PRN

## 2017-10-21 MED ORDER — SODIUM CHLORIDE 0.9% FLUSH
3.0000 mL | Freq: Two times a day (BID) | INTRAVENOUS | Status: DC
  Administered 2017-10-21 – 2017-10-24 (×6): 3 mL via INTRAVENOUS

## 2017-10-21 MED ORDER — CEFAZOLIN SODIUM-DEXTROSE 2-4 GM/100ML-% IV SOLN
2.0000 g | INTRAVENOUS | Status: AC
  Administered 2017-10-21: 2 g via INTRAVENOUS
  Filled 2017-10-21: qty 100

## 2017-10-21 MED ORDER — HYDROMORPHONE HCL 1 MG/ML IJ SOLN
INTRAMUSCULAR | Status: AC
  Filled 2017-10-21: qty 1

## 2017-10-21 MED ORDER — LACTATED RINGERS IV SOLN
INTRAVENOUS | Status: DC | PRN
  Administered 2017-10-21 (×2): via INTRAVENOUS

## 2017-10-21 MED ORDER — LIDOCAINE 2% (20 MG/ML) 5 ML SYRINGE
INTRAMUSCULAR | Status: AC
  Filled 2017-10-21: qty 5

## 2017-10-21 MED ORDER — KETOCONAZOLE 2 % EX CREA
TOPICAL_CREAM | Freq: Two times a day (BID) | CUTANEOUS | Status: DC
  Administered 2017-10-21 – 2017-10-24 (×5): via TOPICAL
  Filled 2017-10-21: qty 15

## 2017-10-21 MED ORDER — GABAPENTIN 300 MG PO CAPS
300.0000 mg | ORAL_CAPSULE | Freq: Three times a day (TID) | ORAL | Status: DC
  Administered 2017-10-21 – 2017-10-27 (×18): 300 mg via ORAL
  Filled 2017-10-21 (×18): qty 1

## 2017-10-21 MED ORDER — MEPERIDINE HCL 50 MG/ML IJ SOLN: 6.2500 mg | INTRAMUSCULAR | Status: DC | PRN

## 2017-10-21 MED ORDER — ROSUVASTATIN CALCIUM 20 MG PO TABS
40.0000 mg | ORAL_TABLET | Freq: Every evening | ORAL | Status: DC
  Administered 2017-10-21 – 2017-10-26 (×6): 40 mg via ORAL
  Filled 2017-10-21 (×6): qty 2

## 2017-10-21 MED ORDER — ADULT MULTIVITAMIN W/MINERALS CH
1.0000 | ORAL_TABLET | Freq: Every day | ORAL | Status: DC
  Administered 2017-10-22 – 2017-10-27 (×6): 1 via ORAL
  Filled 2017-10-21 (×6): qty 1

## 2017-10-21 MED ORDER — DEXAMETHASONE SODIUM PHOSPHATE 10 MG/ML IJ SOLN
INTRAMUSCULAR | Status: AC
  Filled 2017-10-21: qty 1

## 2017-10-21 MED ORDER — LIDOCAINE-EPINEPHRINE 0.5 %-1:200000 IJ SOLN
INTRAMUSCULAR | Status: AC
  Filled 2017-10-21: qty 1

## 2017-10-21 MED ORDER — BUDESONIDE 0.25 MG/2ML IN SUSP
0.2500 mg | Freq: Two times a day (BID) | RESPIRATORY_TRACT | Status: DC
  Administered 2017-10-22 – 2017-10-27 (×11): 0.25 mg via RESPIRATORY_TRACT
  Filled 2017-10-21 (×11): qty 2

## 2017-10-21 MED ORDER — HYDROCHLOROTHIAZIDE 12.5 MG PO CAPS
12.5000 mg | ORAL_CAPSULE | Freq: Every day | ORAL | Status: DC
  Administered 2017-10-22 – 2017-10-27 (×6): 12.5 mg via ORAL
  Filled 2017-10-21 (×6): qty 1

## 2017-10-21 MED ORDER — HEMOSTATIC AGENTS (NO CHARGE) OPTIME
TOPICAL | Status: DC | PRN
  Administered 2017-10-21 (×2): 1 via TOPICAL

## 2017-10-21 MED ORDER — MENTHOL 3 MG MT LOZG: 1.0000 | LOZENGE | OROMUCOSAL | Status: DC | PRN

## 2017-10-21 MED ORDER — ACETAMINOPHEN 650 MG RE SUPP: 650.0000 mg | RECTAL | Status: DC | PRN

## 2017-10-21 MED ORDER — MORPHINE SULFATE (PF) 2 MG/ML IV SOLN: 2.0000 mg | INTRAVENOUS | Status: DC | PRN

## 2017-10-21 MED ORDER — FENTANYL CITRATE (PF) 250 MCG/5ML IJ SOLN
INTRAMUSCULAR | Status: AC
Start: 2017-10-21 — End: ?
  Filled 2017-10-21: qty 5

## 2017-10-21 MED ORDER — VITAMIN D 1000 UNITS PO TABS
1000.0000 [IU] | ORAL_TABLET | Freq: Every day | ORAL | Status: DC
  Administered 2017-10-22 – 2017-10-27 (×6): 1000 [IU] via ORAL
  Filled 2017-10-21 (×6): qty 1

## 2017-10-21 MED ORDER — FAMOTIDINE 20 MG PO TABS
20.0000 mg | ORAL_TABLET | Freq: Every day | ORAL | Status: DC
  Administered 2017-10-22 – 2017-10-27 (×6): 20 mg via ORAL
  Filled 2017-10-21 (×7): qty 1

## 2017-10-21 MED ORDER — IRBESARTAN 150 MG PO TABS
150.0000 mg | ORAL_TABLET | Freq: Every day | ORAL | Status: DC
  Administered 2017-10-22 – 2017-10-27 (×6): 150 mg via ORAL
  Filled 2017-10-21 (×6): qty 1

## 2017-10-21 MED ORDER — THROMBIN 20000 UNITS EX SOLR
CUTANEOUS | Status: DC | PRN
  Administered 2017-10-21: 18:00:00 via TOPICAL

## 2017-10-21 MED ORDER — DEXAMETHASONE SODIUM PHOSPHATE 4 MG/ML IJ SOLN
INTRAMUSCULAR | Status: DC | PRN
  Administered 2017-10-21: 10 mg via INTRAVENOUS

## 2017-10-21 MED ORDER — MIDAZOLAM HCL 5 MG/5ML IJ SOLN
INTRAMUSCULAR | Status: DC | PRN
  Administered 2017-10-21: 2 mg via INTRAVENOUS

## 2017-10-21 MED ORDER — AZELASTINE HCL 0.1 % NA SOLN: 2.0000 | Freq: Every evening | NASAL | Status: DC | PRN

## 2017-10-21 MED ORDER — POTASSIUM CHLORIDE IN NACL 20-0.9 MEQ/L-% IV SOLN
INTRAVENOUS | Status: DC
  Administered 2017-10-21 – 2017-10-22 (×4): via INTRAVENOUS
  Filled 2017-10-21 (×4): qty 1000

## 2017-10-21 MED ORDER — DOCUSATE SODIUM 100 MG PO CAPS
100.0000 mg | ORAL_CAPSULE | Freq: Two times a day (BID) | ORAL | Status: DC
  Administered 2017-10-21 – 2017-10-27 (×12): 100 mg via ORAL
  Filled 2017-10-21 (×12): qty 1

## 2017-10-21 MED ORDER — MIDAZOLAM HCL 2 MG/2ML IJ SOLN: 0.5000 mg | Freq: Once | INTRAMUSCULAR | Status: DC | PRN

## 2017-10-21 MED ORDER — TADALAFIL 5 MG PO TABS: 5.0000 mg | ORAL_TABLET | Freq: Every day | ORAL | Status: DC | PRN

## 2017-10-21 MED ORDER — NITROGLYCERIN 0.4 MG SL SUBL: 0.4000 mg | SUBLINGUAL_TABLET | SUBLINGUAL | Status: DC | PRN

## 2017-10-21 MED ORDER — ALUM & MAG HYDROXIDE-SIMETH 200-200-20 MG/5ML PO SUSP: 30.0000 mL | Freq: Four times a day (QID) | ORAL | Status: DC | PRN

## 2017-10-21 MED ORDER — METOPROLOL TARTRATE 12.5 MG HALF TABLET
12.5000 mg | ORAL_TABLET | Freq: Two times a day (BID) | ORAL | Status: DC
  Administered 2017-10-21 – 2017-10-27 (×11): 12.5 mg via ORAL
  Filled 2017-10-21 (×12): qty 1

## 2017-10-21 MED ORDER — TRIAMCINOLONE ACETONIDE 0.1 % EX CREA: TOPICAL_CREAM | Freq: Two times a day (BID) | CUTANEOUS | Status: DC

## 2017-10-21 MED ORDER — ONDANSETRON HCL 4 MG PO TABS: 4.0000 mg | ORAL_TABLET | Freq: Four times a day (QID) | ORAL | Status: DC | PRN

## 2017-10-21 MED ORDER — BISACODYL 5 MG PO TBEC
5.0000 mg | DELAYED_RELEASE_TABLET | Freq: Every day | ORAL | Status: DC | PRN
  Administered 2017-10-25 – 2017-10-26 (×2): 5 mg via ORAL
  Filled 2017-10-21 (×2): qty 1

## 2017-10-21 MED ORDER — OXYCODONE HCL 5 MG PO TABS
ORAL_TABLET | ORAL | Status: AC
  Filled 2017-10-21: qty 1

## 2017-10-21 MED ORDER — SODIUM CHLORIDE 0.9% FLUSH
3.0000 mL | INTRAVENOUS | Status: DC | PRN
Start: 2017-10-21 — End: 2017-10-25

## 2017-10-21 MED ORDER — DIAZEPAM 5 MG PO TABS
ORAL_TABLET | ORAL | Status: AC
  Filled 2017-10-21: qty 1

## 2017-10-21 MED ORDER — PHENOL 1.4 % MT LIQD: 1.0000 | OROMUCOSAL | Status: DC | PRN

## 2017-10-21 MED ORDER — ONDANSETRON HCL 4 MG/2ML IJ SOLN
INTRAMUSCULAR | Status: AC
  Filled 2017-10-21: qty 2

## 2017-10-21 MED ORDER — MIDAZOLAM HCL 2 MG/2ML IJ SOLN
INTRAMUSCULAR | Status: AC
  Filled 2017-10-21: qty 2

## 2017-10-21 MED ORDER — OXYCODONE HCL 5 MG PO TABS
10.0000 mg | ORAL_TABLET | ORAL | Status: DC | PRN
  Administered 2017-10-23 – 2017-10-27 (×4): 10 mg via ORAL
  Filled 2017-10-21 (×5): qty 2

## 2017-10-21 SURGICAL SUPPLY — 51 items
BENZOIN TINCTURE PRP APPL 2/3 (GAUZE/BANDAGES/DRESSINGS) IMPLANT
BLADE CLIPPER SURG (BLADE) ×3 IMPLANT
BUR MATCHSTICK NEURO 3.0 LAGG (BURR) ×3 IMPLANT
CANISTER SUCT 3000ML PPV (MISCELLANEOUS) ×3 IMPLANT
CARTRIDGE OIL MAESTRO DRILL (MISCELLANEOUS) ×2 IMPLANT
CONT SPEC 4OZ CLIKSEAL STRL BL (MISCELLANEOUS) ×3 IMPLANT
DERMABOND ADVANCED (GAUZE/BANDAGES/DRESSINGS) ×1
DERMABOND ADVANCED .7 DNX12 (GAUZE/BANDAGES/DRESSINGS) ×2 IMPLANT
DIFFUSER DRILL AIR PNEUMATIC (MISCELLANEOUS) ×3 IMPLANT
DRAPE C-ARMOR (DRAPES) IMPLANT
DRAPE LAPAROTOMY 100X72X124 (DRAPES) ×3 IMPLANT
DRAPE POUCH INSTRU U-SHP 10X18 (DRAPES) ×3 IMPLANT
DRAPE SURG 17X23 STRL (DRAPES) ×3 IMPLANT
DRSG OPSITE POSTOP 4X8 (GAUZE/BANDAGES/DRESSINGS) ×3 IMPLANT
DURAPREP 26ML APPLICATOR (WOUND CARE) ×6 IMPLANT
ELECT BLADE 4.0 EZ CLEAN MEGAD (MISCELLANEOUS) ×3
ELECT REM PT RETURN 9FT ADLT (ELECTROSURGICAL) ×3
ELECTRODE BLDE 4.0 EZ CLN MEGD (MISCELLANEOUS) ×2 IMPLANT
ELECTRODE REM PT RTRN 9FT ADLT (ELECTROSURGICAL) ×2 IMPLANT
FLOSEAL 5ML (HEMOSTASIS) ×3 IMPLANT
GAUZE SPONGE 4X4 12PLY STRL (GAUZE/BANDAGES/DRESSINGS) IMPLANT
GAUZE SPONGE 4X4 16PLY XRAY LF (GAUZE/BANDAGES/DRESSINGS) IMPLANT
GLOVE BIO SURGEON STRL SZ8 (GLOVE) ×3 IMPLANT
GLOVE ECLIPSE 6.5 STRL STRAW (GLOVE) ×6 IMPLANT
GOWN STRL REUS W/ TWL LRG LVL3 (GOWN DISPOSABLE) ×4 IMPLANT
GOWN STRL REUS W/ TWL XL LVL3 (GOWN DISPOSABLE) ×2 IMPLANT
GOWN STRL REUS W/TWL 2XL LVL3 (GOWN DISPOSABLE) IMPLANT
GOWN STRL REUS W/TWL LRG LVL3 (GOWN DISPOSABLE) ×2
GOWN STRL REUS W/TWL XL LVL3 (GOWN DISPOSABLE) ×1
GRAFT DURAGEN MATRIX 1WX1L (Tissue) ×3 IMPLANT
KIT BASIN OR (CUSTOM PROCEDURE TRAY) ×3 IMPLANT
KIT TURNOVER KIT B (KITS) ×3 IMPLANT
NEEDLE HYPO 25X1 1.5 SAFETY (NEEDLE) ×3 IMPLANT
NEEDLE SPNL 18GX3.5 QUINCKE PK (NEEDLE) ×3 IMPLANT
NS IRRIG 1000ML POUR BTL (IV SOLUTION) ×3 IMPLANT
OIL CARTRIDGE MAESTRO DRILL (MISCELLANEOUS) ×3
PACK LAMINECTOMY NEURO (CUSTOM PROCEDURE TRAY) ×3 IMPLANT
PAD ARMBOARD 7.5X6 YLW CONV (MISCELLANEOUS) ×9 IMPLANT
SEALANT ADHERUS EXTEND TIP (MISCELLANEOUS) ×3 IMPLANT
SPONGE LAP 4X18 RFD (DISPOSABLE) IMPLANT
SPONGE SURGIFOAM ABS GEL 100 (HEMOSTASIS) ×3 IMPLANT
STRIP CLOSURE SKIN 1/2X4 (GAUZE/BANDAGES/DRESSINGS) IMPLANT
SUT ETHILON 3 0 FSL (SUTURE) ×3 IMPLANT
SUT PROLENE 6 0 BV (SUTURE) IMPLANT
SUT VIC AB 0 CT1 18XCR BRD8 (SUTURE) ×2 IMPLANT
SUT VIC AB 0 CT1 8-18 (SUTURE) ×1
SUT VIC AB 2-0 CT1 18 (SUTURE) ×6 IMPLANT
SUT VIC AB 3-0 SH 8-18 (SUTURE) ×3 IMPLANT
TOWEL GREEN STERILE (TOWEL DISPOSABLE) ×3 IMPLANT
TOWEL GREEN STERILE FF (TOWEL DISPOSABLE) ×3 IMPLANT
WATER STERILE IRR 1000ML POUR (IV SOLUTION) ×3 IMPLANT

## 2017-10-21 NOTE — Transfer of Care (Signed)
Immediate Anesthesia Transfer of Care Note  Patient: George Alvarez  Procedure(s) Performed: Lumbar Two-Three, Lumbar Four-Five Discectomy (N/A Spine Lumbar)  Patient Location: PACU  Anesthesia Type:General  Level of Consciousness: sedated, patient cooperative and responds to stimulation  Airway & Oxygen Therapy: Patient Spontanous Breathing and Patient connected to nasal cannula oxygen  Post-op Assessment: Report given to RN, Post -op Vital signs reviewed and stable and Patient moving all extremities  Post vital signs: Reviewed and stable  Last Vitals:  Vitals Value Taken Time  BP 137/89 10/21/2017  6:58 PM  Temp    Pulse 75 10/21/2017  6:59 PM  Resp 19 10/21/2017  6:59 PM  SpO2 97 % 10/21/2017  6:59 PM  Vitals shown include unvalidated device data.  Last Pain:  Vitals:   10/21/17 1311  TempSrc:   PainSc: 5       Patients Stated Pain Goal: 3 (06/77/03 4035)  Complications: No apparent anesthesia complications

## 2017-10-21 NOTE — Anesthesia Procedure Notes (Signed)
Procedure Name: Intubation Date/Time: 10/21/2017 4:01 PM Performed by: Eulas Post, Nayelis Bonito W, CRNA Pre-anesthesia Checklist: Patient identified, Emergency Drugs available, Suction available, Patient being monitored and Timeout performed Patient Re-evaluated:Patient Re-evaluated prior to induction Oxygen Delivery Method: Circle system utilized Preoxygenation: Pre-oxygenation with 100% oxygen Induction Type: IV induction Ventilation: Mask ventilation without difficulty and Oral airway inserted - appropriate to patient size Laryngoscope Size: Sabra Heck and 2 Grade View: Grade II Tube size: 7.5 mm Number of attempts: 1 Airway Equipment and Method: Stylet Placement Confirmation: ETT inserted through vocal cords under direct vision,  positive ETCO2 and breath sounds checked- equal and bilateral Secured at: 23 cm Tube secured with: Tape Dental Injury: Teeth and Oropharynx as per pre-operative assessment  Comments: Inserted by Viann Fish, SRNA

## 2017-10-21 NOTE — Anesthesia Preprocedure Evaluation (Addendum)
Anesthesia Evaluation  Patient identified by MRN, date of birth, ID band Patient awake    Reviewed: Allergy & Precautions, NPO status , Patient's Chart, lab work & pertinent test results, reviewed documented beta blocker date and time   History of Anesthesia Complications Negative for: history of anesthetic complications  Airway Mallampati: II  TM Distance: >3 FB Neck ROM: Full    Dental  (+) Dental Advisory Given, Chipped   Pulmonary asthma ,    breath sounds clear to auscultation       Cardiovascular hypertension, Pt. on medications and Pt. on home beta blockers (-) angina+ CAD and + Cardiac Stents (2013 BMS x2 OM2 )   Rhythm:Regular Rate:Normal  2015 Stress: normal   Neuro/Psych CVA, No Residual Symptoms negative psych ROS   GI/Hepatic Neg liver ROS, GERD  Medicated,  Endo/Other  obese  Renal/GU negative Renal ROS     Musculoskeletal  (+) Arthritis ,   Abdominal (+) + obese,   Peds  Hematology negative hematology ROS (+)   Anesthesia Other Findings   Reproductive/Obstetrics                            Anesthesia Physical Anesthesia Plan  ASA: III  Anesthesia Plan: General   Post-op Pain Management:    Induction: Intravenous  PONV Risk Score and Plan: 3 and Ondansetron and Dexamethasone  Airway Management Planned: Oral ETT  Additional Equipment:   Intra-op Plan:   Post-operative Plan: Extubation in OR  Informed Consent: I have reviewed the patients History and Physical, chart, labs and discussed the procedure including the risks, benefits and alternatives for the proposed anesthesia with the patient or authorized representative who has indicated his/her understanding and acceptance.   Dental advisory given  Plan Discussed with: CRNA and Surgeon  Anesthesia Plan Comments: (Plan routine monitors, GETA)        Anesthesia Quick Evaluation

## 2017-10-21 NOTE — H&P (Signed)
BP (!) 143/81   Pulse 73   Temp 98.3 F (36.8 C) (Oral)   Resp 20   Ht 5\' 10"  (1.778 m)   Wt 102.2 kg (225 lb 4.8 oz)   SpO2 98%   BMI 32.33 kg/m    George Alvarez comes in today because he as a patient of Dr. Sande Rives was receiving epidural injections performed by Dr. Brien Few.  He had severe neurogenic claudication and experience with that seven or eight years ago.  He underwent injections of both his cervical and lumbar spine and had a great deal of improvement over a long period of time.  He came back to see Dr. Hal Neer and while they discussed surgery Dr. Hal Neer felt that maybe they should try the injections again since he had had such a good response, and he received an injection in January of this year, performed by Dr. Brien Few in the lumbar spine.  He had a second injection scheduled for this coming Wednesday.  About a week ago, he started to have pain in his back and left lower extremity, which was different than the pain that he has had previously.  He went to see his physician, who made a recommendation that seeing Dr. Hal Neer again might be helpful as the injection might not be needed, might not help, or might be located at a different spot.  I am now seeing him as a part of that transition from Dr. Sande Rives retirement.  He says the pain starts in the lower back and radiates into the left lower extremity.  There is tingling associated with this.  He has taken eight weeks worth of Prednisone, without any significant relief.  He states that his pain is a 4/10 at this point.  He has had no bowel or bladder dysfunction.  He does not describe weakness.  He has normal muscle tone, bulk, and coordination.  He can toe walk, heel walk, and do a squat without difficulty.          PHYSICAL EXAMINATION:                    Vital signs:  Height 5 feet 10 inches, weight 224 pounds, temperature is 99.4, blood pressure is 123/81, pulse 67, pain is 4/10.  He had 2+ reflexes at the biceps, triceps, brachial  radialis, possibly 3+ at the knees and ankles.  No clonus.  No Hoffman's sign.  Normal strength in the upper extremities and normal strength in the lower extremities.     Allergies  Allergen Reactions  . Aspirin Swelling    Angioedema in 1980s Medi Alert bracelet recommended  . Nifedipine Dermatitis and Rash    edema   Past Medical History:  Diagnosis Date  . Anal fissure   . Arthritis   . Asthma, chronic 06/16/2007   Qualifier: Diagnosis of  By: Linna Darner MD, Gwyndolyn Saxon   Onset:as child Triggers (environmental, infectious, allergic): all, mainly environmental triggers Rescue inhaler CWC:BJSEGB Maintenance medications/ response:Singulair,Qvar Smoking history:never Family history pulmonary disease: no    . Basal cell carcinoma of chest wall 2016   1.8 cm, treated with electrodessication and currettage  . Benign paroxysmal positional vertigo 11/19/2012   Diagnosed at [facility]  Physical therapy appointment pending   . BPH (benign prostatic hyperplasia)    With urinary obstruction  . Coronary artery disease    a. 02/2012 Cath/PCI: LM 10, LAD min irregs, LCX large, OM1 sm, 40 ost, OM2 95p (5.0x16 Veriflex & 4.5x12 Veriflex  BMS'), RCA 30p, 23m, 30d, PDA/PLA min irregs, EF 65%  . Diverticulosis   . Erectile dysfunction due to arterial insufficiency   . Fundic gland polyps of stomach, benign 2010  . GERD (gastroesophageal reflux disease)   . History of elevated PSA   . History of kidney stones   . Hyperlipidemia   . Hypertension   . Nocturia   . Perennial allergic rhinitis   . Peyronie's disease   . Skin cancer    Basal and squamous cell cancers, greater than 20  . Stroke ([place]) 2016  . Tubular adenoma of colon 2010   Past Surgical History:  Procedure Laterality Date  . COLONOSCOPY  2003, 2011   negative  . CORONARY ANGIOPLASTY WITH STENT PLACEMENT  02/06/2012   OM2  bare metal   . EPIDURAL BLOCK INJECTION  04-2015   Dr Brien Few  . epidural steroids  2007, 2009   X 2 @  cervical &, lumbar)  . HERNIA REPAIR    . INTRAVASCULAR ULTRASOUND  02/06/2012   Procedure: INTRAVASCULAR ULTRASOUND;  Surgeon: [name], MD;  Location: [facility] CATH LAB;  Service: Cardiovascular;;  . LEFT HEART CATHETERIZATION WITH CORONARY ANGIOGRAM N/A 02/06/2012   Procedure: LEFT HEART CATHETERIZATION WITH CORONARY ANGIOGRAM;  Surgeon: [name], MD;  Location: [facility] CATH LAB;  Service: Cardiovascular;  Laterality: N/A;  . PERCUTANEOUS CORONARY STENT INTERVENTION (PCI-S)  02/06/2012   Procedure: PERCUTANEOUS CORONARY STENT INTERVENTION (PCI-S);  Surgeon: [name], MD;  Location: [facility] CATH LAB;  Service: Cardiovascular;;  . septoplasty    . TONSILLECTOMY AND ADENOIDECTOMY    . TRIGGER FINGER RELEASE    . UPPER GASTROINTESTINAL ENDOSCOPY  2011   gastric polyps  . VASECTOMY     Prior to Admission medications   Medication Sig Start Date End Date Taking? Authorizing Provider  cholecalciferol (VITAMIN D) 1000 units tablet Take 1,000 Units by mouth daily.   Yes [provider]  clopidogrel (PLAVIX) 75 MG tablet Take 1 tablet (75 mg total) by mouth daily. Patient taking differently: Take 75 mg by mouth every evening.  02/04/17  Yes [name], MD  famotidine (PEPCID) 20 MG tablet TAKE 1 TABLET BY MOUTH EVERY DAY Patient taking differently: Take 20 mg by mouth once daily in the evening 02/17/17  Yes McAlhany, Annita Brod, MD  FLOVENT HFA 110 MCG/ACT inhaler ONE PUFF TWICE A DAY TO PREVENT COUGHING AND WHEEZING. RINSE, GARGLE AND SPIT OUT AFTER USE 11/27/16  Yes [provider]  fluticasone (FLONASE) 50 MCG/ACT nasal spray ONE SPRAY EACH NOSTRIL TWICE A DAY IF NEEDED FOR STUFFY NOSE. Patient taking differently: Instill 1 spray in to each nostril twice daily 09/23/17  Yes Bardelas, Jose A, MD  hydrocortisone 2.5 % cream Apply 1 application topically 2 (two) times daily. 07/20/17  Yes [provider]  ketoconazole (NIZORAL) 2 %  cream 1(ONE) APPLICATION(S) TOPICAL 2(TWO) TIMES A DAY TO NECK AND BEHIND EARS 07/20/17  Yes [provider]  metoprolol tartrate (LOPRESSOR) 25 MG tablet Take 0.5 tablets (12.5 mg total) by mouth 2 (two) times daily. 07/30/17  Yes Paz, Alda Berthold, MD  montelukast (SINGULAIR) 10 MG tablet ONE TABLET ONCE A DAY FOR COUGHING OR WHEEZING Patient taking differently: Take 10 mg by mouth every evening.  02/02/17  Yes Bardelas, Jens Som, MD  montelukast (SINGULAIR) 10 MG tablet TAKE ONE TABLET ONCE A DAY FOR COUGHING OR WHEEZING 10/19/17  Yes Bardelas, Jens Som, MD  Multiple Vitamin (MULTIVITAMIN WITH MINERALS) TABS tablet  Take 1 tablet by mouth daily.   Yes [provider]  olmesartan-hydrochlorothiazide (BENICAR HCT) 20-12.5 MG tablet Take 1 tablet by mouth daily. 08/05/17  Yes Paz, Alda Berthold, MD  Omega-3 Fatty Acids (FISH OIL) 1200 MG CAPS Take 1,200 mg by mouth daily.   Yes [provider]  rosuvastatin (CRESTOR) 40 MG tablet Take 1 tablet (40 mg total) by mouth daily. Patient taking differently: Take 40 mg by mouth every evening.  10/02/17  Yes Paz, Alda Berthold, MD  tadalafil (CIALIS) 5 MG tablet Take 5 mg by mouth daily as needed for erectile dysfunction.   Yes [provider]  triamcinolone cream (KENALOG) 0.1 % 1(ONE) APPLICATION(S) TOPICAL 2(TWO) TIMES A DAY TO ARMS 07/20/17  Yes [provider]  albuterol (PROVENTIL HFA;VENTOLIN HFA) 108 (90 Base) MCG/ACT inhaler Inhale 2 puffs into the lungs every 6 (six) hours as needed for wheezing or shortness of breath.    [provider]  azelastine (ASTELIN) 0.1 % nasal spray Place 2 sprays into both nostrils at bedtime as needed for rhinitis or allergies. Patient not taking: Reported on 10/15/2017 08/13/17   Colon Branch, MD  mometasone (NASONEX) 50 MCG/ACT nasal spray ONE SPRAY IN EACH NOSTRIL TWICE A DAY IF NEEDED FOR STUFFY NOSE Patient not taking: Reported on 10/15/2017 07/30/17   Charlies Silvers, MD  nitroGLYCERIN  (NITROSTAT) 0.4 MG SL tablet Place 1 tablet (0.4 mg total) under the tongue every 5 (five) minutes as needed for chest pain. 01/04/16   [name], MD   Family History  Problem Relation Age of Onset  . Heart attack Father 18       died @ 60  . Heart disease Mother   . Breast cancer Mother   . Esophageal cancer Mother        Barrett's  . Heart attack Maternal Grandmother        after 65  . Dementia Maternal Grandmother        CVA  . Breast cancer Maternal Grandmother   . Diabetes Maternal Grandmother   . Stroke Maternal Grandmother        in 60s  . Heart attack Paternal Uncle 1  . Breast cancer Maternal Aunt   . Colon cancer Neg Hx   . Prostate cancer Neg Hx   . Colon polyps Neg Hx    Social History   Socioeconomic History  . Marital status: Married    Spouse name: Not on file  . Number of children: 3  . Years of education: Not on file  . Highest education level: Not on file  Occupational History  . Occupation: Higher education careers adviser   Social Needs  . Financial resource strain: Not on file  . Food insecurity:    Worry: Not on file    Inability: Not on file  . Transportation needs:    Medical: Not on file    Non-medical: Not on file  Tobacco Use  . Smoking status: Never Smoker  . Smokeless tobacco: Never Used  Substance and Sexual Activity  . Alcohol use: Yes    Alcohol/week: 0.0 oz    Comment: Wine occasionally  . Drug use: No  . Sexual activity: Yes    Partners: Female  Lifestyle  . Physical activity:    Days per week: Not on file    Minutes per session: Not on file  . Stress: Not on file  Relationships  . Social connections:    Talks on phone: Not on file  Gets together: Not on file    Attends religious service: Not on file    Active member of club or organization: Not on file    Attends meetings of clubs or organizations: Not on file    Relationship status: Not on file  . Intimate partner violence:    Fear of current or ex  partner: Not on file    Emotionally abused: Not on file    Physically abused: Not on file    Forced sexual activity: Not on file  Other Topics Concern  . Not on file  Social History Narrative   3 daughters, 68 g-children   lifes w/ wife in [place]   George Alvarez had acute onset of pain.  I had him undergo an MRI on 06/22.  What it shows is that he has a new disc herniation at L4-5 which is on the right side.  That is where he is markedly stenotic.  Also, has spondylolisthesis and facet arthropathy; and in addition to that, an another new disc herniation on the left side at L2-3.  That fragment has migrated caudally behind L3.  He also needs that surgery done.  He has pain on both the left and right side.  The left side is worse than the right.  We spoke about doing this without the fusion, but I thought about it very briefly and decided that made no sense.  He is markedly stenotic at 4-5.  He needs to be fused as he does have the listhesis.  It is even more unstable and that is why he pushed out even more disc there and despite the fact that the injections had helped, he has now had another change.  I do not think there is any really appropriate choice other than the arthrodesis after the decompression and cage placement of 4-5 and a simple discectomy at 2-3, hopefully just removing the fragment.George Alvarez' insurance company did not approve the fusion, he would still like to proceed with the discetomy at L4/5. I do not believe this is the appropriate procedure, however he is in severe pain, and simply cannot wait. We will proceed.

## 2017-10-21 NOTE — Op Note (Signed)
10/21/2017  7:28 PM  PATIENT:  George Alvarez  72 y.o. male with two herniated discs at L2/3 on the left, and L4/5 on the right  And pain in both lower extremities. I recommended that he undergo a fusion, but his insurance company refused that operation so he has opted for discetomies.  PRE-OPERATIVE DIAGNOSIS:  Lumbar herniated discLeft L2/3;  Right L4/5 hnp  POST-OPERATIVE DIAGNOSIS:  Lumbar herniated discLeft L2/3;  Right L4/5 hnp  PROCEDURE:  Procedure(s): Lumbar Two-Three, Lumbar Four-Five Discectomy  SURGEON:   Surgeon(s): Ashok Pall, MD Eustace Moore, MD  ASSISTANTS:Jones, Shanon Brow  ANESTHESIA:   general  EBL:  No intake/output data recorded.  BLOOD ADMINISTERED:none  CELL SAVER GIVEN:none  COUNT:per nursing  DRAINS: none   SPECIMEN:  No Specimen  DICTATION: [name] was taken to the operating room, intubated and placed under a general anesthetic without difficulty. He was positioned prone on a Wilson frame with all pressure points padded. His back was prepped and draped in a sterile manner. I opened the skin with a 10 blade and carried the dissection down to the thoracolumbar fascia. I used both sharp dissection and the monopolar cautery to expose the lamina of 2,3,4, and 5. I confirmed my location with an intraoperative xray.  I used the drill, Kerrison punches, and curettes to perform a semihemilaminectomy of L2,3,4, and 5. I used the punches to remove the ligamentum flavum to expose the thecal sac. I brought the microscope into the operative field and with Dr.Jones' assistance we started our decompression of the spinal canal, thecal sac and 3, and L4, and 5 root(s). I cauterized epidural veins overlying the disc spaces then divided them sharply. I opened the disc spaces with a 15 blade and proceeded with the discectomies. I used pituitary rongeurs, curettes, and other instruments to remove disc material. After the discectomies were completed I inspected the L3, L4,  and L5 nerve root and felt it was well decompressed. I explored rostrally, laterally, medially, and caudally and was satisfied with the decompression. I irrigated the wound, then closed in layers. I approximated the thoracolumbar fascia, subcutaneous, and subcuticular planes with vicryl sutures. I used dermabond for a sterile dressing.  There was a durotomy at L2/3, which was not repaired primarily  But I covered the defect with duragen , and used a dural sealant. PLAN OF CARE: Admit to inpatient   PATIENT DISPOSITION:  PACU - hemodynamically stable.   Delay start of Pharmacological VTE agent (>24hrs) due to surgical blood loss or risk of bleeding:  yes

## 2017-10-22 ENCOUNTER — Encounter (HOSPITAL_COMMUNITY): Payer: Self-pay | Admitting: Neurosurgery

## 2017-10-22 ENCOUNTER — Other Ambulatory Visit: Payer: Self-pay

## 2017-10-22 MED FILL — Thrombin (Recombinant) For Soln 20000 Unit: CUTANEOUS | Qty: 1 | Status: AC

## 2017-10-22 NOTE — Progress Notes (Signed)
Patient ID: George Alvarez, male   DOB: [birth year], 72 y.o.   MRN: [id number] BP (!) 102/58 (BP Location: Right Arm)   Pulse 74   Temp 97.7 F (36.5 C) (Oral)   Resp 14   Ht 5\' 10"  (1.778 m)   Wt 102.2 kg (225 lb 4.8 oz)   SpO2 98%   BMI 32.33 kg/m  Alert and oriented x 4, wound is clean, dry Will remain on bedrest Doing well

## 2017-10-22 NOTE — Progress Notes (Signed)
Patient had a good day.  Full liquid diet with 100% in each meal (breakfast and lunch).  Patient has no complaints of nausea or vomiting.  Pain has been controlled.  Propped/ turned Q2.

## 2017-10-23 NOTE — Care Management Important Message (Signed)
Important Message  Patient Details  Name: George Alvarez MRN: [id number] Date of Birth: 12/15/1945   Medicare Important Message Given:  Yes    Leenah Seidner Montine Circle 10/23/2017, 3:27 PM

## 2017-10-23 NOTE — Progress Notes (Signed)
Patient ID: George Alvarez, male   DOB: October 31, 1945, 72 y.o.   MRN: [id number] BP 125/81 (BP Location: Right Arm)   Pulse 68   Temp 97.8 F (36.6 C) (Oral)   Resp 14   Ht 5\' 10"  (1.778 m)   Wt 102.2 kg (225 lb 4.8 oz)   SpO2 93%   BMI 32.33 kg/m  Alert and oriented x 4 Moving all extremities Wound is flat and dry Doing well Up tomorrow.

## 2017-10-23 NOTE — Anesthesia Postprocedure Evaluation (Signed)
Anesthesia Post Note  Patient: Zell Hylton Constantino  Procedure(s) Performed: Lumbar Two-Three, Lumbar Four-Five Discectomy (N/A Spine Lumbar)     Patient location during evaluation: PACU Anesthesia Type: General Level of consciousness: awake and alert Pain management: pain level controlled Vital Signs Assessment: post-procedure vital signs reviewed and stable Respiratory status: spontaneous breathing, nonlabored ventilation, respiratory function stable and patient connected to nasal cannula oxygen Cardiovascular status: blood pressure returned to baseline and stable Postop Assessment: no apparent nausea or vomiting Anesthetic complications: no    Last Vitals:  Vitals:   10/23/17 1158 10/23/17 1627  BP: 108/65 125/81  Pulse: 69 68  Resp:    Temp: 36.9 C 36.6 C  SpO2: 95% 93%    Last Pain:  Vitals:   10/23/17 1627  TempSrc: Oral  PainSc:                  Sayre Witherington

## 2017-10-24 MED ORDER — CHLORHEXIDINE GLUCONATE CLOTH 2 % EX PADS
6.0000 | MEDICATED_PAD | Freq: Every day | CUTANEOUS | Status: DC
  Administered 2017-10-24: 6 via TOPICAL

## 2017-10-24 MED ORDER — MUPIROCIN 2 % EX OINT
1.0000 "application " | TOPICAL_OINTMENT | Freq: Two times a day (BID) | CUTANEOUS | Status: DC
  Administered 2017-10-24: 1 via NASAL
  Filled 2017-10-24: qty 22

## 2017-10-24 NOTE — Progress Notes (Signed)
Patient tolerated incline well slowly after receiving Tylenol for headache this am.  Patient able to sit in chair for 30 mins this afternoon without pain or headache. Wife at the beside offering encouragement and support all day.

## 2017-10-24 NOTE — Progress Notes (Signed)
Call To Dr Trenton Gammon patient experiencing frontal headache with bed at 14 degree elevation.  Dr Trenton Gammon instructed ok to datay at 14 degree and give Tylenol.

## 2017-10-24 NOTE — Progress Notes (Signed)
Overall stable.  No new issues or problems.  Patient with some mild headache earlier this morning this is now resolved.  Beginning to have his head elevated patient denies lower extremity pain numbness or weakness.  Afebrile.  Vital signs are stable.  Awake and alert.  Oriented and appropriate.  Motor and sensory exam intact.  Wound clean and dry.  Progressing well.  Continue slow mobilization.Marland Kitchen

## 2017-10-25 MED ORDER — ORAL CARE MOUTH RINSE: 15.0000 mL | Freq: Two times a day (BID) | OROMUCOSAL | Status: DC

## 2017-10-25 MED ORDER — CHLORHEXIDINE GLUCONATE 0.12 % MT SOLN
15.0000 mL | Freq: Two times a day (BID) | OROMUCOSAL | Status: DC
  Administered 2017-10-25: 15 mL via OROMUCOSAL
  Filled 2017-10-25: qty 15

## 2017-10-25 NOTE — Progress Notes (Signed)
Overall stable.  Patient reports back pain is well controlled.  No lower extremity pain.  Tolerated mobilization yesterday.  Continue mobilization today.  Possible discharge home tomorrow.  On exam motor and sensory function are intact.  His wound is clean and dry.

## 2017-10-25 NOTE — Progress Notes (Signed)
1900: Handoff report received from RN. Pt resting in bed. Discussed plan of care for the shift; pt amenable to plan.  0000: Pt resting comfortably.  0400: Pt continues resting comfortably.  0700: Handoff report given to RN. No acute events overnight.  

## 2017-10-25 NOTE — Evaluation (Signed)
Physical Therapy Evaluation Patient Details Name: George Alvarez MRN: [id number] DOB: 09/28/1945 Today's Date: 10/25/2017   History of Present Illness  George Alvarez  72 y.o. male with two herniated discs at L2/3 on the left, and L4/5 on the right; now s/p L2-5 discectomies, durotomy at L2/3, and was on bedrest for 48 hours;  has a past medical history of Anal fissure, Arthritis, Asthma, chronic (06/16/2007), Basal cell carcinoma of chest wall (2016), Benign paroxysmal positional vertigo (11/19/2012),  Coronary artery disease, Diverticulosis, Fundic gland polyps of stomach, benign (2010), GERD (gastroesophageal reflux disease), History of kidney stones, Hyperlipidemia, Hypertension, Nocturia, Perennial allergic rhinitis, Peyronie's disease, Skin cancer, Stroke ([place]) (2016), and Tubular adenoma of colon (2010).  Clinical Impression   Patient is s/p above surgery resulting in functional limitations due to the deficits listed below (see PT Problem List). Independent prior to admission; Presents with decr functional mobility and decr activity tolerance;  Patient will benefit from skilled PT to increase their independence and safety with mobility to allow discharge to the venue listed below.       Follow Up Recommendations Home health PT;Supervision/Assistance - 24 hour    Equipment Recommendations  Rolling walker with 5" wheels;3in1 (PT)    Recommendations for Other Services       Precautions / Restrictions Precautions Precautions: Fall;Back      Mobility  Bed Mobility Overal bed mobility: Needs Assistance Bed Mobility: Rolling;Sidelying to Sit;Sit to Sidelying Rolling: Min guard Sidelying to sit: Min guard     Sit to sidelying: Supervision General bed mobility comments: minguard and cues for log roll technqiue  Transfers Overall transfer level: Needs assistance Equipment used: Rolling walker (2 wheeled) Transfers: Sit to/from Stand Sit to Stand: Min guard          General transfer comment: Cues for hand placement and safety, especially with descent to sit  Ambulation/Gait Ambulation/Gait assistance: Min guard Gait Distance (Feet): 200 Feet Assistive device: Rolling walker (2 wheeled) Gait Pattern/deviations: Step-through pattern;Decreased step length - right;Decreased step length - left     General Gait Details: Cues for RW proximity and posture; cues also to self-monitor for activity tolerance; Reports feeling "off" and "not quite right" with walking; fatigued towards end of walk  Stairs            Wheelchair Mobility    Modified Rankin (Stroke Patients Only)       Balance                                             Pertinent Vitals/Pain Pain Assessment: 0-10 Pain Score: 6  Pain Location: low back Pain Descriptors / Indicators: Aching Pain Intervention(s): Monitored during session    Home Living Family/patient expects to be discharged to:: Private residence Living Arrangements: Spouse/significant other Available Help at Discharge: Family;Available PRN/intermittently Type of Home: House Home Access: Stairs to enter   [employer] of Steps: 3 Home Layout: Two level;Bed/bath upstairs Home Equipment: (Need more info)      Prior Function Level of Independence: Independent               Hand Dominance        Extremity/Trunk Assessment   Upper Extremity Assessment Upper Extremity Assessment: Overall WFL for tasks assessed    Lower Extremity Assessment Lower Extremity Assessment: Generalized weakness       Communication   Communication:  No difficulties  Cognition Arousal/Alertness: Awake/alert Behavior During Therapy: WFL for tasks assessed/performed Overall Cognitive Status: Within Functional Limits for tasks assessed                                        General Comments General comments (skin integrity, edema, etc.): BP standing 131/92 prior to walk,  and BP sanding 115/85 at end of walk    Exercises     Assessment/Plan    PT Assessment Patient needs continued PT services  PT Problem List Decreased strength;Decreased range of motion;Decreased activity tolerance;Decreased balance;Decreased mobility;Decreased knowledge of use of DME;Decreased knowledge of precautions;Pain       PT Treatment Interventions DME instruction;Gait training;Stair training;Functional mobility training;Therapeutic activities;Therapeutic exercise;Balance training;Patient/family education    PT Goals (Current goals can be found in the Care Plan section)  Acute Rehab PT Goals Patient Stated Goal: "back to chasing grandchildren" PT Goal Formulation: With patient Time For Goal Achievement: 11/08/17 Potential to Achieve Goals: Good    Frequency Min 5X/week   Barriers to discharge        Co-evaluation               AM-PAC PT "6 Clicks" Daily Activity  Outcome Measure Difficulty turning over in bed (including adjusting bedclothes, sheets and blankets)?: None Difficulty moving from lying on back to sitting on the side of the bed? : A Little Difficulty sitting down on and standing up from a chair with arms (e.g., wheelchair, bedside commode, etc,.)?: A Lot Help needed moving to and from a bed to chair (including a wheelchair)?: A Little Help needed walking in hospital room?: A Little Help needed climbing 3-5 steps with a railing? : A Little 6 Click Score: 18    End of Session Equipment Utilized During Treatment: Gait belt Activity Tolerance: Patient tolerated treatment well Patient left: in bed;with call bell/phone within reach Nurse Communication: Mobility status PT Visit Diagnosis: Unsteadiness on feet (R26.81);Other abnormalities of gait and mobility (R26.89);Muscle weakness (generalized) (M62.81)    Time: 2263-3354 PT Time Calculation (min) (ACUTE ONLY): 27 min   Charges:   PT Evaluation $PT Eval Moderate Complexity: 1 Mod PT  Treatments $Gait Training: 8-22 mins   PT G Codes:        Roney Marion, PT  Acute Rehabilitation Services Pager [phone] Office [phone]   [place]letta [place] 10/25/2017, 1:31 PM

## 2017-10-26 NOTE — Care Management Note (Signed)
Case Management Note  Patient Details  Name: George Alvarez MRN: [id number] Date of Birth: 01-18-1946  Subjective/Objective:  George Alvarez  72 y.o. male with two herniated discs at L2/3 on the left, and L4/5 on the right; now s/p L2-5 discectomies, durotomy at L2/3, and was on bedrest for 48 hours.  PTA, pt independent, lives with spouse.                   Action/Plan: PT recommending HH follow up, 3 in 1 for home.  Pt politely declined HH and DME, as recommended.  He states he will walk with his wife, and has RW at home; states has no need for [facility].   Expected Discharge Date:                  Expected Discharge Plan:  Home/Self Care  In-House Referral:     Discharge planning Services  CM Consult  Post Acute Care Choice:    Choice offered to:     DME Arranged:    DME Agency:     HH Arranged:  Patient Refused [place] Agency:     Status of Service:  Completed, signed off  If discussed at [employer] of Stay Meetings, dates discussed:    Additional Comments:  Ella Bodo, RN 10/26/2017, 4:14 PM

## 2017-10-26 NOTE — Progress Notes (Signed)
Patient voiding throughout day but now with complaint "fullness."  Patient bladder scanned prior to voiding, 840 ml. Post void residual for 867ml. I&O cath resulted in 942ml output. Patient expresses relief. Continue to monitor.

## 2017-10-26 NOTE — Progress Notes (Signed)
1900: Handoff report received from RN. Pt sleeping in bed.   2100: Discussed plan of care for the shift; pt amenable to plan.  0000: Pt resting comfortably.  0500: Pt c/o urge to void, but not being able to void much when he tries. I&O cath 934mL.  0700: Handoff report given to RN. No acute events overnight.

## 2017-10-26 NOTE — Progress Notes (Signed)
Physical Therapy Treatment Patient Details Name: George Alvarez MRN: [id number] DOB: 11/07/1945 Today's Date: 10/26/2017    History of Present Illness George Alvarez  72 y.o. male with two herniated discs at L2/3 on the left, and L4/5 on the right; now s/p L2-5 discectomies, durotomy at L2/3, and was on bedrest for 48 hours;  has a past medical history of Anal fissure, Arthritis, Asthma, chronic (06/16/2007), Basal cell carcinoma of chest wall (2016), Benign paroxysmal positional vertigo (11/19/2012),  Coronary artery disease, Diverticulosis, Fundic gland polyps of stomach, benign (2010), GERD (gastroesophageal reflux disease), History of kidney stones, Hyperlipidemia, Hypertension, Nocturia, Perennial allergic rhinitis, Peyronie's disease, Skin cancer, Stroke ([place]) (2016), and Tubular adenoma of colon (2010).    PT Comments    Continuing work on functional mobility and activity tolerance;  MUCH improved activity tolerance, without "feeling off" during extended walk; stair training complete; OK for dc home from PT standpoint    Follow Up Recommendations  Home health PT;Supervision/Assistance - 24 hour     Equipment Recommendations  Rolling walker with 5" wheels;3in1 (PT)    Recommendations for Other Services       Precautions / Restrictions Precautions Precautions: Fall;Back Restrictions Weight Bearing Restrictions: No    Mobility  Bed Mobility                  Transfers Overall transfer level: Needs assistance Equipment used: Rolling walker (2 wheeled) Transfers: Sit to/from Stand Sit to Stand: Supervision         General transfer comment: Cues for hand placement and safety, especially with descent to sit  Ambulation/Gait Ambulation/Gait assistance: Min guard;Supervision Gait Distance (Feet): 280 Feet Assistive device: Rolling walker (2 wheeled) Gait Pattern/deviations: Step-through pattern Gait velocity: approaching WFL   General Gait Details: Mostly cues  for posture   Stairs Stairs: Yes Stairs assistance: Supervision Stair Management: One rail Left;Step to pattern;Sideways Number of Stairs: 12 General stair comments: verbal and demo cues for sequence and technqiue; no difficulty   Wheelchair Mobility    Modified Rankin (Stroke Patients Only)       Balance                                            Cognition Arousal/Alertness: Awake/alert Behavior During Therapy: WFL for tasks assessed/performed Overall Cognitive Status: Within Functional Limits for tasks assessed                                        Exercises      General Comments        Pertinent Vitals/Pain Pain Assessment: Faces Faces Pain Scale: Hurts a little bit Pain Location: low back Pain Descriptors / Indicators: Sore Pain Intervention(s): Monitored during session    Home Living                      Prior Function            PT Goals (current goals can now be found in the care plan section) Acute Rehab PT Goals Patient Stated Goal: "back to chasing grandchildren" PT Goal Formulation: With patient Time For Goal Achievement: 11/08/17 Potential to Achieve Goals: Good Progress towards PT goals: Progressing toward goals    Frequency    Min 5X/week  PT Plan Current plan remains appropriate    Co-evaluation              AM-PAC PT "6 Clicks" Daily Activity  Outcome Measure  Difficulty turning over in bed (including adjusting bedclothes, sheets and blankets)?: None Difficulty moving from lying on back to sitting on the side of the bed? : None Difficulty sitting down on and standing up from a chair with arms (e.g., wheelchair, bedside commode, etc,.)?: None Help needed moving to and from a bed to chair (including a wheelchair)?: None Help needed walking in hospital room?: None Help needed climbing 3-5 steps with a railing? : A Little 6 Click Score: 23    End of Session Equipment  Utilized During Treatment: Gait belt Activity Tolerance: Patient tolerated treatment well Patient left: in chair;with call bell/phone within reach Nurse Communication: Mobility status PT Visit Diagnosis: Unsteadiness on feet (R26.81);Other abnormalities of gait and mobility (R26.89);Muscle weakness (generalized) (M62.81)     Time: 6387-5643 PT Time Calculation (min) (ACUTE ONLY): 26 min  Charges:  $Gait Training: 23-37 mins                    G Codes:       Roney Marion, [place]  Acute Rehabilitation Services Pager [phone] Office [place] 10/26/2017, 10:15 AM

## 2017-10-26 NOTE — Evaluation (Signed)
Occupational Therapy Evaluation Patient Details Name: George Alvarez MRN: [id number] DOB: March 01, 1946 Today's Date: 10/26/2017    History of Present Illness George Alvarez  72 y.o. male with two herniated discs at L2/3 on the left, and L4/5 on the right; now s/p L2-5 discectomies, durotomy at L2/3, and was on bedrest for 48 hours;  has a past medical history of Anal fissure, Arthritis, Asthma, chronic (06/16/2007), Basal cell carcinoma of chest wall (2016), Benign paroxysmal positional vertigo (11/19/2012),  Coronary artery disease, Diverticulosis, Fundic gland polyps of stomach, benign (2010), GERD (gastroesophageal reflux disease), History of kidney stones, Hyperlipidemia, Hypertension, Nocturia, Perennial allergic rhinitis, Peyronie's disease, Skin cancer, Stroke ([place]) (2016), and Tubular adenoma of colon (2010).   Clinical Impression   PTA Pt independent in ADL and mobility. Pt is currently mod A for LB ADL, and supervision for functional transfers. Back handout provided and reviewed adls in detail. Pt educated on: avoid sitting for long periods of time, correct bed positioning for sleeping, correct sequence for bed mobility, avoiding lifting more than 5 pounds and never wash directly over incision as well as compensatory methods for ADL.  Initiated education with AE - focus on grabber/reacher and toilet aide. Pt will benefit from skilled OT in the acute setting with focus on practice/demo of AE kit (verbally reviewed today).   Of note: discussed 3 in 1 and Pt DOES agree to one for use as a toilet riser and shower chair - recommended for safety and for transfers.     Follow Up Recommendations  No OT follow up;Supervision - Intermittent(initially)    Equipment Recommendations  3 in 1 bedside commode;Other (comment)(toilet aide)    Recommendations for Other Services       Precautions / Restrictions Precautions Precautions: Fall;Back Precaution Booklet Issued: Yes (comment) Precaution  Comments: reviewed in full with application to ADL Restrictions Weight Bearing Restrictions: No      Mobility Bed Mobility               General bed mobility comments: Pt OOB in recliner when therapist entered - he reports "I feel good about the log roll technique"  Transfers Overall transfer level: Needs assistance Equipment used: Rolling walker (2 wheeled) Transfers: Sit to/from Stand Sit to Stand: Supervision         General transfer comment: Cues for hand placement and safety, especially with descent to sit    Balance                                           ADL either performed or assessed with clinical judgement   ADL Overall ADL's : Needs assistance/impaired Eating/Feeding: Modified independent   Grooming: Supervision/safety;Standing Grooming Details (indicate cue type and reason): educated on compensatory strategies (cup method, setting up on the right) Upper Body Bathing: Moderate assistance Upper Body Bathing Details (indicate cue type and reason): for back Lower Body Bathing: Moderate assistance;Sitting/lateral leans Lower Body Bathing Details (indicate cue type and reason): needs demo for long handle sponge - reviewed verbally Upper Body Dressing : Minimal assistance;Sitting   Lower Body Dressing: Minimal assistance;Sit to/from stand Lower Body Dressing Details (indicate cue type and reason): Pt is able to bring feet cross to knees for donning socks, also verbally educated on reacher/grabber to assist - would benefit from demo/practice with reacher/grabber Toilet Transfer: Supervision/safety;Ambulation;RW;Comfort height toilet   Toileting- Clothing Manipulation and Hygiene: Supervision/safety;Sit  to/from stand   Tub/ Banker: Min guard   Functional mobility during ADLs: Supervision/safety;Rolling walker General ADL Comments: no AE kit on floor for demo. handout provided. Pt interested in seeing demo of AE.     Vision  Baseline Vision/History: Wears glasses Wears Glasses: At all times Patient Visual Report: No change from baseline Vision Assessment?: No apparent visual deficits     Perception     Praxis      Pertinent Vitals/Pain Pain Assessment: Faces Faces Pain Scale: Hurts a little bit Pain Location: low back Pain Descriptors / Indicators: Sore Pain Intervention(s): Monitored during session;Repositioned     Hand Dominance Right   Extremity/Trunk Assessment Upper Extremity Assessment Upper Extremity Assessment: Overall WFL for tasks assessed   Lower Extremity Assessment Lower Extremity Assessment: Defer to PT evaluation       Communication Communication Communication: No difficulties   Cognition Arousal/Alertness: Awake/alert Behavior During Therapy: WFL for tasks assessed/performed Overall Cognitive Status: Within Functional Limits for tasks assessed                                     General Comments       Exercises     Shoulder Instructions      Home Living Family/patient expects to be discharged to:: Private residence Living Arrangements: Spouse/significant other Available Help at Discharge: Family;Available PRN/intermittently Type of Home: House Home Access: Stairs to enter Entrance Stairs-Number of Steps: 3   Home Layout: Two level;Bed/bath upstairs Alternate Level Stairs-Number of Steps: 12 Alternate Level Stairs-Rails: Right Bathroom Shower/Tub: Teacher, early years/pre: Standard     Home Equipment: Financial controller: Reacher        Prior Functioning/Environment Level of Independence: Independent                 OT Problem List: Impaired balance (sitting and/or standing);Decreased safety awareness;Decreased knowledge of use of DME or AE;Decreased knowledge of precautions;Pain      OT Treatment/Interventions: Self-care/ADL training;DME and/or AE instruction;Patient/family education;Balance  training;Therapeutic activities    OT Goals(Current goals can be found in the care plan section) Acute Rehab OT Goals Patient Stated Goal: "back to chasing grandchildren" OT Goal Formulation: With patient Time For Goal Achievement: 11/09/17 Potential to Achieve Goals: Good ADL Goals Pt Will Perform Lower Body Bathing: with modified independence;with adaptive equipment;sit to/from stand Pt Will Perform Lower Body Dressing: with modified independence;with adaptive equipment;sit to/from stand Additional ADL Goal #1: Pt will recall and maintain back precautions during morning ADL routine at independent level Additional ADL Goal #2: Pt will demonstrate use of AE kit for to maintain back precautions during ADL.  OT Frequency: Min 2X/week   Barriers to D/C:            Co-evaluation              AM-PAC PT "6 Clicks" Daily Activity     Outcome Measure Help from another person eating meals?: None Help from another person taking care of personal grooming?: A Little Help from another person toileting, which includes using toliet, bedpan, or urinal?: A Little Help from another person bathing (including washing, rinsing, drying)?: A Lot Help from another person to put on and taking off regular upper body clothing?: A Little Help from another person to put on and taking off regular lower body clothing?: A Lot 6 Click Score: 17   End of Session Equipment Utilized  During Treatment: Rolling walker Nurse Communication: Mobility status  Activity Tolerance: Patient tolerated treatment well Patient left: in chair;with call bell/phone within reach;with nursing/sitter in room  OT Visit Diagnosis: Unsteadiness on feet (R26.81);Pain Pain - Right/Left: (central) Pain - part of body: (back)                Time: 3419-3790 OT Time Calculation (min): 24 min Charges:  OT General Charges $OT Visit: 1 Visit OT Evaluation $OT Eval Moderate Complexity: 1 Mod OT Treatments $Self Care/Home Management  : 8-22 mins G-Codes:     Hulda Humphrey OTR/L [place] 10/26/2017, 4:58 PM

## 2017-10-27 MED ORDER — TIZANIDINE HCL 4 MG PO TABS: 4.0000 mg | ORAL_TABLET | Freq: Four times a day (QID) | ORAL | 0 refills | Status: DC | PRN

## 2017-10-27 MED ORDER — TAMSULOSIN HCL 0.4 MG PO CAPS: 0.4000 mg | ORAL_CAPSULE | Freq: Every day | ORAL | 0 refills | Status: AC

## 2017-10-27 MED ORDER — OXYCODONE HCL 5 MG PO TABS: 5.0000 mg | ORAL_TABLET | Freq: Four times a day (QID) | ORAL | 0 refills | Status: DC | PRN

## 2017-10-27 NOTE — Care Management Note (Signed)
Case Management Note  Patient Details  Name: George Alvarez MRN: [id number] Date of Birth: May 05, 1945  Subjective/Objective:                    Action/Plan: CM spoke with patient again today about [facility] services and he has decided to have [facility] PT come to his home. CM provided him choice and he selected [facility]. Dan with [facility] notified and accepted the referral.  Pt also with orders for walker and 3 in 1. Pt states he has a walker at home but will take the 3 in 1. James with [facility] DME notified and will deliver the DME to the room. Pt's spouse to provide transportation home for the patient.   Expected Discharge Date:  10/27/17               Expected Discharge Plan:  Home/Self Care  In-House Referral:     Discharge planning Services  CM Consult  Post Acute Care Choice:  Home Health Choice offered to:  Patient  DME Arranged:  3-N-1 DME Agency:  [place]:  PT [facility] Agency:  [place]  Status of Service:  Completed, signed off  If discussed at [place] of Stay Meetings, dates discussed:    Additional Comments:  Pollie Friar, RN 10/27/2017, 1:21 PM

## 2017-10-27 NOTE — Progress Notes (Signed)
Occupational Therapy Treatment Patient Details Name: George Alvarez MRN: [id number] DOB: [birth year] Today's Date: 10/27/2017    History of present illness George Alvarez  72 y.o. male with two herniated discs at L2/3 on the left, and L4/5 on the right; now s/p L2-5 discectomies, durotomy at L2/3, and was on bedrest for 48 hours;  has a past medical history of Anal fissure, Arthritis, Asthma, chronic (06/16/2007), Basal cell carcinoma of chest wall (2016), Benign paroxysmal positional vertigo (11/19/2012),  Coronary artery disease, Diverticulosis, Fundic gland polyps of stomach, benign (2010), GERD (gastroesophageal reflux disease), History of kidney stones, Hyperlipidemia, Hypertension, Nocturia, Perennial allergic rhinitis, Peyronie's disease, Skin cancer, Stroke ([place]) (2016), and Tubular adenoma of colon (2010).   OT comments  Patient treatment by Occupational Therapy with no further acute OT needs identified. All education has been completed and the patient has no further questions. See below for any follow-up Occupational Therapy or equipment needs. OT to sign off. Thank you for referral.    Follow Up Recommendations  No OT follow up    Equipment Recommendations  3 in 1 bedside commode;Other (comment)(toilet aide - pt plans to purchase from [employer])    Recommendations for Other Services      Precautions / Restrictions Precautions Precautions: Fall;Back Precaution Comments: recalled all precautions 1005 Restrictions Weight Bearing Restrictions: No       Mobility Bed Mobility Overal bed mobility: Needs Assistance Bed Mobility: Sit to Sidelying         Sit to sidelying: Modified independent (Device/Increase time) General bed mobility comments: in chair on arrival. Ot discussing bed mobility adn pt reports "oh thats no problem"  Transfers Overall transfer level: Modified independent   Transfers: Sit to/from Stand Sit to Stand: Modified independent (Device/Increase time)         General transfer comment: good hand placmeent    Balance Overall balance assessment: No apparent balance deficits (not formally assessed)                                         ADL either performed or assessed with clinical judgement   ADL Overall ADL's : Modified independent                                       General ADL Comments: pt able to cross bil LE into a figure 4 to dress. pt don LB clothing and even shoes this session. pt has a reacher at home and plans to keep it by his chair in case he drops the remote.     Vision       Perception     Praxis      Cognition Arousal/Alertness: Awake/alert Behavior During Therapy: WFL for tasks assessed/performed Overall Cognitive Status: Within Functional Limits for tasks assessed                                          Exercises     Shoulder Instructions       General Comments educated on back avoid sitting for long periods of time, correct bed positioning for sleeping, correct sequence for bed mobility, avoiding lifting more than 5 pounds and never wash directly over incision. All education is  complete and patient indicates understanding.      Pertinent Vitals/ Pain       Pain Assessment: Faces Faces Pain Scale: Hurts a little bit Pain Location: low back Pain Descriptors / Indicators: Sore Pain Intervention(s): Premedicated before session;Repositioned  Home Living                                          Prior Functioning/Environment              Frequency  Min 2X/week        Progress Toward Goals  OT Goals(current goals can now be found in the care plan section)  Progress towards OT goals: Goals met/education completed, patient discharged from OT  Acute Rehab OT Goals Patient Stated Goal: to go home today. mentioned the "fab five" meaning the 5 grandchildren OT Goal Formulation: With patient Time For Goal Achievement:  11/09/17 Potential to Achieve Goals: Good ADL Goals Pt Will Perform Lower Body Bathing: with modified independence;with adaptive equipment;sit to/from stand Pt Will Perform Lower Body Dressing: with modified independence;with adaptive equipment;sit to/from stand Additional ADL Goal #1: Pt will recall and maintain back precautions during morning ADL routine at independent level Additional ADL Goal #2: Pt will demonstrate use of AE kit for to maintain back precautions during ADL.  Plan All goals met and education completed, patient discharged from OT services    Co-evaluation                 AM-PAC PT "6 Clicks" Daily Activity     Outcome Measure   Help from another person eating meals?: None Help from another person taking care of personal grooming?: None Help from another person toileting, which includes using toliet, bedpan, or urinal?: None Help from another person bathing (including washing, rinsing, drying)?: None Help from another person to put on and taking off regular upper body clothing?: None Help from another person to put on and taking off regular lower body clothing?: None 6 Click Score: 24    End of Session Equipment Utilized During Treatment: Rolling walker  OT Visit Diagnosis: Unsteadiness on feet (R26.81)   Activity Tolerance Patient tolerated treatment well   Patient Left in chair;with call bell/phone within reach   Nurse Communication Mobility status;Precautions        Time: 5573-2202 OT Time Calculation (min): 12 min  Charges: OT General Charges $OT Visit: 1 Visit OT Treatments $Self Care/Home Management : 8-22 mins   George Alvarez   OTR/L Pager: [phone] Office: [phone] .    George Alvarez 10/27/2017, 11:23 AM

## 2017-10-27 NOTE — Progress Notes (Signed)
Patient ID: George Alvarez, male   DOB: 1946/03/14, 72 y.o.   MRN: [id number] Alert and oriented Will discharge later this morning Doing well overall Wound is clean, and dry

## 2017-10-27 NOTE — Progress Notes (Signed)
Patient discharged home after  14 Fr foley cath inserted and 950 cc clear urine returned.  Appointment made with Dr Ralene Muskrat Practioner for [place] for follow up with indwelling foley cath and urinary cath. Patient rec'd 3 RXs  With extra supplies.  Wife at the bedside for education on maintenance of foley care.

## 2017-10-27 NOTE — Care Management Important Message (Signed)
Important Message  Patient Details  Name: George Alvarez MRN: [id number] Date of Birth: Apr 06, 1946   Medicare Important Message Given:  Yes    [name] 10/27/2017, 8:53 AM

## 2017-10-27 NOTE — Discharge Instructions (Signed)
Lumbar Discectomy °Care After °A discectomy involves removal of discmaterial (the cartilage-like structures located between the bones of the back). It is done to relieve pressure on nerve roots. It can be used as a treatment for a back problem. The time in surgery depends on the findings in surgery and what is necessary to correct the problems. °HOME CARE INSTRUCTIONS  °· Check the cut (incision) made by the surgeon twice a day for signs of infection. Some signs of infection may include:  °· A foul smelling, greenish or yellowish discharge from the wound.  °· Increased pain.  °· Increased redness over the incision (operative) site.  °· The skin edges may separate.  °· Flu-like symptoms (problems).  °· A temperature above 101.5° F (38.6° C).  °· Change your bandages in about 24 to 36 hours following surgery or as directed.  °· You may shower tomrrow.  Avoid bathtubs, swimming pools and hot tubs for three weeks or until your incision has healed completely. °· Follow your doctor's instructions as to safe activities, exercises, and physical therapy.  °· Weight reduction may be beneficial if you are overweight.  °· Daily exercise is helpful to prevent the return of problems. Walking is permitted. You may use a treadmill without an incline. Cut down on activities and exercise if you have discomfort. You may also go up and down stairs as much as you can tolerate.  °· DO NOT lift anything heavier than 10 to 15 lbs. Avoid bending or twisting at the waist. Always bend your knees when lifting.  °· Maintain strength and range of motion as instructed.  °· Do not drive for 10 days, or as directed by your doctors. You may be a passenger . Lying back in the passenger seat may be more comfortable for you. Always wear a seatbelt.  °· Limit your sitting in a regular chair to 20 to 30 minutes at a time. There are no limitations for sitting in a recliner. You should lie down or walk in between sitting periods.  °· Only take  over-the-counter or prescription medicines for pain, discomfort, or fever as directed by your caregiver.  °SEEK MEDICAL CARE IF:  °· There is increased bleeding (more than a small spot) from the wound.  °· You notice redness, swelling, or increasing pain in the wound.  °· Pus is coming from wound.  °· You develop an unexplained oral temperature above 102° F (38.9° C) develops.  °· You notice a foul smell coming from the wound or dressing.  °· You have increasing pain in your wound.  °SEEK IMMEDIATE MEDICAL CARE IF:  °· You develop a rash.  °· You have difficulty breathing.  °· You develop any allergic problems to medicines given.  °Document Released: 02/27/2004 Document Revised: 03/13/2011 Document Reviewed: 06/17/2007 °ExitCare® Patient Information °

## 2017-10-27 NOTE — Progress Notes (Signed)
Physical Therapy Treatment Patient Details Name: George Alvarez MRN: [id number] DOB: 29-Dec-1945 Today's Date: 10/27/2017    History of Present Illness George Alvarez  72 y.o. male with two herniated discs at L2/3 on the left, and L4/5 on the right; now s/p L2-5 discectomies, durotomy at L2/3, and was on bedrest for 48 hours;  has a past medical history of Anal fissure, Arthritis, Asthma, chronic (06/16/2007), Basal cell carcinoma of chest wall (2016), Benign paroxysmal positional vertigo (11/19/2012),  Coronary artery disease, Diverticulosis, Fundic gland polyps of stomach, benign (2010), GERD (gastroesophageal reflux disease), History of kidney stones, Hyperlipidemia, Hypertension, Nocturia, Perennial allergic rhinitis, Peyronie's disease, Skin cancer, Stroke ([place]) (2016), and Tubular adenoma of colon (2010).    PT Comments    Doing well, all education completed, no further PT needs at this time.   Follow Up Recommendations  No PT follow up;Supervision/Assistance - 24 hour     Equipment Recommendations  Rolling walker with 5" wheels;3in1 (PT)    Recommendations for Other Services       Precautions / Restrictions Precautions Precautions: Fall;Back Restrictions Weight Bearing Restrictions: No    Mobility  Bed Mobility Overal bed mobility: Needs Assistance Bed Mobility: Sit to Sidelying         Sit to sidelying: Modified independent (Device/Increase time) General bed mobility comments: moves with ease to supine  Transfers Overall transfer level: Needs assistance   Transfers: Sit to/from Stand Sit to Stand: Modified independent (Device/Increase time)            Ambulation/Gait Ambulation/Gait assistance: Supervision;Modified independent (Device/Increase time) Gait Distance (Feet): 250 Feet Assistive device: Rolling walker (2 wheeled) Gait Pattern/deviations: Step-through pattern Gait velocity: moderate Gait velocity interpretation: 1.31 - 2.62 ft/sec,  indicative of limited community ambulator General Gait Details: steady, likely not needing the RW   Stairs             Wheelchair Mobility    Modified Rankin (Stroke Patients Only)       Balance Overall balance assessment: No apparent balance deficits (not formally assessed)                                          Cognition Arousal/Alertness: Awake/alert Behavior During Therapy: WFL for tasks assessed/performed Overall Cognitive Status: Within Functional Limits for tasks assessed                                        Exercises      General Comments General comments (skin integrity, edema, etc.): pt reinforced in back prec, log roll/transitions to supine, lifting restrictions, progression of activity.      Pertinent Vitals/Pain Pain Assessment: Faces Faces Pain Scale: Hurts a little bit Pain Location: low back Pain Descriptors / Indicators: Sore Pain Intervention(s): Monitored during session    Home Living                      Prior Function            PT Goals (current goals can now be found in the care plan section) Acute Rehab PT Goals Patient Stated Goal: "back to chasing grandchildren" PT Goal Formulation: With patient Time For Goal Achievement: 11/08/17 Potential to Achieve Goals: Good Progress towards PT goals: Progressing toward goals    Frequency  Min 5X/week      PT Plan Current plan remains appropriate    Co-evaluation              AM-PAC PT "6 Clicks" Daily Activity  Outcome Measure  Difficulty turning over in bed (including adjusting bedclothes, sheets and blankets)?: None Difficulty moving from lying on back to sitting on the side of the bed? : None Difficulty sitting down on and standing up from a chair with arms (e.g., wheelchair, bedside commode, etc,.)?: None Help needed moving to and from a bed to chair (including a wheelchair)?: None Help needed walking in hospital  room?: None Help needed climbing 3-5 steps with a railing? : A Little 6 Click Score: 23    End of Session   Activity Tolerance: Patient tolerated treatment well Patient left: in bed;with call bell/phone within reach;with family/visitor present Nurse Communication: Mobility status PT Visit Diagnosis: Unsteadiness on feet (R26.81);Other abnormalities of gait and mobility (R26.89)     Time: 8832-5498 PT Time Calculation (min) (ACUTE ONLY): 19 min  Charges:  $Gait Training: 8-22 mins                    G Codes:       11-14-2017  George Alvarez, PT [phone]  (pager)   George Alvarez 2017/11/14, 10:36 AM

## 2017-10-28 NOTE — Discharge Summary (Signed)
Physician Discharge Summary  Patient ID: George Alvarez MRN: [id number] DOB/AGE: 05-13-1945 72 y.o.  Admit date: 10/21/2017 Discharge date: 10/28/2017  Admission Diagnoses:hnp lumbar Left L2/3, Right L4/5  Discharge Diagnoses:  Active Problems:   HNP (herniated nucleus pulposus), lumbar   Discharged Condition: good  Hospital Course: Mr. Cozzolino underwent a lumbar laminectomy at Left L3/4, and right L4/5. He had a dural opening at the L2/3 site. I repaired it secondarily and closed that incision with suture. At L4/5 I removed the disc herniation. The operation was intended to be a lumbar fusion at L4/5 due to spondylolisthesis, and facet arthropathy. However he agreed to do the laminectomy and discetomy. Post op he never had fluid emanate from the wound. He did have urinary hesitancy, and was discharged with a leg bag to be followed up by his urologist.  His wounds at discharge were clean, dry, and without signs of infection. He is ambulating, will be discharged with a home health referral for physical therapy, he is also voiding, and tolerating a regular diet.  Treatments: surgery: as above  Discharge Exam: Blood pressure 110/64, pulse 77, temperature 97.8 F (36.6 C), resp. rate 16, height 5\' 10"  (1.778 m), weight 102.2 kg (225 lb 4.8 oz), SpO2 96 %. Neurologic: Alert and oriented X 3, normal strength and tone. Normal symmetric reflexes. Normal coordination and gait  Disposition:  Lumbar herniated disc; Spondylolisthesis  Allergies as of 10/27/2017      Reactions   Aspirin Swelling   Angioedema in 1980s Medi Alert bracelet recommended   Nifedipine Dermatitis, Rash   edema      Medication List    TAKE these medications   albuterol 108 (90 Base) MCG/ACT inhaler Commonly known as:  PROVENTIL HFA;VENTOLIN HFA Inhale 2 puffs into the lungs every 6 (six) hours as needed for wheezing or shortness of breath.   azelastine 0.1 % nasal spray Commonly known as:  ASTELIN Place 2  sprays into both nostrils at bedtime as needed for rhinitis or allergies.   cholecalciferol 1000 units tablet Commonly known as:  VITAMIN D Take 1,000 Units by mouth daily.   clopidogrel 75 MG tablet Commonly known as:  PLAVIX Take 1 tablet (75 mg total) by mouth daily. What changed:  when to take this   famotidine 20 MG tablet Commonly known as:  PEPCID TAKE 1 TABLET BY MOUTH EVERY DAY What changed:    how much to take  how to take this  when to take this   Fish Oil 1200 MG Caps Take 1,200 mg by mouth daily.   FLOVENT HFA 110 MCG/ACT inhaler Generic drug:  fluticasone ONE PUFF TWICE A DAY TO PREVENT COUGHING AND WHEEZING. RINSE, GARGLE AND SPIT OUT AFTER USE   fluticasone 50 MCG/ACT nasal spray Commonly known as:  FLONASE ONE SPRAY EACH NOSTRIL TWICE A DAY IF NEEDED FOR STUFFY NOSE. What changed:  See the new instructions.   hydrocortisone 2.5 % cream Apply 1 application topically 2 (two) times daily.   ketoconazole 2 % cream Commonly known as:  NIZORAL 1(ONE) APPLICATION(S) TOPICAL 2(TWO) TIMES A DAY TO NECK AND BEHIND EARS   metoprolol tartrate 25 MG tablet Commonly known as:  LOPRESSOR Take 0.5 tablets (12.5 mg total) by mouth 2 (two) times daily.   mometasone 50 MCG/ACT nasal spray Commonly known as:  NASONEX ONE SPRAY IN EACH NOSTRIL TWICE A DAY IF NEEDED FOR STUFFY NOSE   montelukast 10 MG tablet Commonly known as:  SINGULAIR ONE TABLET ONCE A  DAY FOR COUGHING OR WHEEZING What changed:    how much to take  how to take this  when to take this  additional instructions   montelukast 10 MG tablet Commonly known as:  SINGULAIR TAKE ONE TABLET ONCE A DAY FOR COUGHING OR WHEEZING What changed:  Another medication with the same name was changed. Make sure you understand how and when to take each.   multivitamin with minerals Tabs tablet Take 1 tablet by mouth daily.   nitroGLYCERIN 0.4 MG SL tablet Commonly known as:  NITROSTAT Place 1 tablet  (0.4 mg total) under the tongue every 5 (five) minutes as needed for chest pain.   olmesartan-hydrochlorothiazide 20-12.5 MG tablet Commonly known as:  BENICAR HCT Take 1 tablet by mouth daily.   oxyCODONE 5 MG immediate release tablet Commonly known as:  ROXICODONE Take 1 tablet (5 mg total) by mouth every 6 (six) hours as needed for severe pain.   rosuvastatin 40 MG tablet Commonly known as:  CRESTOR Take 1 tablet (40 mg total) by mouth daily. What changed:  when to take this   tadalafil 5 MG tablet Commonly known as:  CIALIS Take 5 mg by mouth daily as needed for erectile dysfunction.   tamsulosin 0.4 MG Caps capsule Commonly known as:  FLOMAX Take 1 capsule (0.4 mg total) by mouth daily.   tiZANidine 4 MG tablet Commonly known as:  ZANAFLEX Take 1 tablet (4 mg total) by mouth every 6 (six) hours as needed for muscle spasms.   triamcinolone cream 0.1 % Commonly known as:  KENALOG 1(ONE) APPLICATION(S) TOPICAL 2(TWO) TIMES A DAY TO ARMS      Follow-up Information    Ashok Pall, MD Follow up in 3 week(s).   Specialty:  Neurosurgery Why:  please call the office to make an appointment Contact information: [address] [place] [address] [phone]           Signed: Winfield Cunas 10/28/2017, 5:34 PM

## 2017-10-31 DIAGNOSIS — Z8673 Personal history of transient ischemic attack (TIA), and cerebral infarction without residual deficits: Secondary | ICD-10-CM | POA: Diagnosis not present

## 2017-10-31 DIAGNOSIS — K219 Gastro-esophageal reflux disease without esophagitis: Secondary | ICD-10-CM | POA: Diagnosis not present

## 2017-10-31 DIAGNOSIS — Z4789 Encounter for other orthopedic aftercare: Secondary | ICD-10-CM | POA: Diagnosis not present

## 2017-10-31 DIAGNOSIS — E785 Hyperlipidemia, unspecified: Secondary | ICD-10-CM | POA: Diagnosis not present

## 2017-10-31 DIAGNOSIS — Z85828 Personal history of other malignant neoplasm of skin: Secondary | ICD-10-CM | POA: Diagnosis not present

## 2017-10-31 DIAGNOSIS — Z981 Arthrodesis status: Secondary | ICD-10-CM | POA: Diagnosis not present

## 2017-10-31 DIAGNOSIS — I10 Essential (primary) hypertension: Secondary | ICD-10-CM | POA: Diagnosis not present

## 2017-10-31 DIAGNOSIS — Z7902 Long term (current) use of antithrombotics/antiplatelets: Secondary | ICD-10-CM | POA: Diagnosis not present

## 2017-10-31 DIAGNOSIS — N138 Other obstructive and reflux uropathy: Secondary | ICD-10-CM | POA: Diagnosis not present

## 2017-10-31 DIAGNOSIS — N401 Enlarged prostate with lower urinary tract symptoms: Secondary | ICD-10-CM | POA: Diagnosis not present

## 2017-10-31 DIAGNOSIS — M1990 Unspecified osteoarthritis, unspecified site: Secondary | ICD-10-CM | POA: Diagnosis not present

## 2017-10-31 DIAGNOSIS — J45909 Unspecified asthma, uncomplicated: Secondary | ICD-10-CM | POA: Diagnosis not present

## 2017-10-31 DIAGNOSIS — Z955 Presence of coronary angioplasty implant and graft: Secondary | ICD-10-CM | POA: Diagnosis not present

## 2017-10-31 DIAGNOSIS — I2510 Atherosclerotic heart disease of native coronary artery without angina pectoris: Secondary | ICD-10-CM | POA: Diagnosis not present

## 2017-10-31 DIAGNOSIS — M48062 Spinal stenosis, lumbar region with neurogenic claudication: Secondary | ICD-10-CM | POA: Diagnosis not present

## 2017-11-03 ENCOUNTER — Telehealth: Payer: Self-pay | Admitting: *Deleted

## 2017-11-03 NOTE — Telephone Encounter (Signed)
Received Physician Orders from [facility]; forwarded to provider/SLS 0/30

## 2017-11-04 DIAGNOSIS — R338 Other retention of urine: Secondary | ICD-10-CM | POA: Diagnosis not present

## 2017-11-04 NOTE — Telephone Encounter (Signed)
Per PCP- orders need to be signed by [facility] at neurosurgery. Forms faxed to [facility] w/ this information at [phone]. Fax confirmation received. Form shredded.

## 2017-11-10 DIAGNOSIS — R338 Other retention of urine: Secondary | ICD-10-CM | POA: Diagnosis not present

## 2017-11-20 ENCOUNTER — Other Ambulatory Visit: Payer: Self-pay | Admitting: Internal Medicine

## 2017-12-10 ENCOUNTER — Other Ambulatory Visit: Payer: Self-pay | Admitting: Cardiovascular Disease

## 2017-12-15 ENCOUNTER — Encounter: Payer: Self-pay | Admitting: Physical Therapy

## 2017-12-15 ENCOUNTER — Other Ambulatory Visit: Payer: Self-pay

## 2017-12-15 ENCOUNTER — Ambulatory Visit: Payer: PPO | Attending: Neurosurgery | Admitting: Physical Therapy

## 2017-12-15 DIAGNOSIS — G8929 Other chronic pain: Secondary | ICD-10-CM | POA: Insufficient documentation

## 2017-12-15 DIAGNOSIS — R293 Abnormal posture: Secondary | ICD-10-CM | POA: Diagnosis not present

## 2017-12-15 DIAGNOSIS — M542 Cervicalgia: Secondary | ICD-10-CM | POA: Diagnosis not present

## 2017-12-15 DIAGNOSIS — M545 Low back pain: Secondary | ICD-10-CM | POA: Diagnosis not present

## 2017-12-15 NOTE — Patient Instructions (Signed)
Bridge   Lie back, legs bent, lift bottom toward ceiling.  Hold 3-5 sec. Repeat 10-30 times. Do __2__ sessions per day.  Knee to Chest (Flexion)   Pull knee toward chest. Feel stretch in lower back or buttock area. You may need to put the other leg straight to feel a better stretch. Breathing deeply, Hold __30__ seconds. Repeat with other knee. Then with both knees. Repeat _3___ times each leg. Do _2___ sessions per day.   Or do this one below.  Knee-to-Chest Stretch: Unilateral    With hand behind right knee, pull knee in to chest until a comfortable stretch is felt in lower back and buttocks. Keep back relaxed. Hold _30___ seconds. Repeat _3___ times per set. Do ____ sets per session. Do __2__ sessions per day.    Lower Trunk Rotation Stretch   Keeping back flat and feet together, rotate knees to left side. Hold __10__ seconds. Repeat __5__ times per set. Do ____ sets per session. Do _2_ sessions per day.  [email or web address]   Madelyn Flavors, PT 12/15/17 10:17 AM [address] [place], [place], [address] Phone: [phone]   Fax:  [phone]

## 2017-12-15 NOTE — Therapy (Signed)
[facility] [address]  [place], [place], [address] Phone: [phone]   Fax:  [phone]  Physical Therapy Evaluation  Patient Details  Name: George Alvarez MRN: [id number] Date of Birth: Nov 25, 1945 Referring Provider: Ashok Pall MD   Encounter Date: 12/15/2017  PT End of Session - 12/15/17 0933    Visit Number  1    Number of Visits  12    Date for PT Re-Evaluation  01/26/18    PT Start Time  0934    PT Stop Time  1017    PT Time Calculation (min)  43 min    Activity Tolerance  Patient tolerated treatment well    Behavior During Therapy  [facility] for tasks assessed/performed       Past Medical History:  Diagnosis Date  . Anal fissure   . Arthritis   . Asthma, chronic 06/16/2007   Qualifier: Diagnosis of  By: Linna Darner MD, Gwyndolyn Saxon   Onset:as child Triggers (environmental, infectious, allergic): all, mainly environmental triggers Rescue inhaler KWI:OXBDZH Maintenance medications/ response:Singulair,Qvar Smoking history:never Family history pulmonary disease: no    . Basal cell carcinoma of chest wall 2016   1.8 cm, treated with electrodessication and currettage  . Benign paroxysmal positional vertigo 11/19/2012   Diagnosed at [facility], [facility]  Physical therapy appointment pending   . BPH (benign prostatic hyperplasia)    With urinary obstruction  . Coronary artery disease    a. 02/2012 Cath/PCI: LM 10, LAD min irregs, LCX large, OM1 sm, 40 ost, OM2 95p (5.0x16 Veriflex & 4.5x12 Veriflex BMS'), RCA 30p, 25m, 30d, PDA/PLA min irregs, EF 65%  . Diverticulosis   . Erectile dysfunction due to arterial insufficiency   . Fundic gland polyps of stomach, benign 2010  . GERD (gastroesophageal reflux disease)   . History of elevated PSA   . History of kidney stones   . Hyperlipidemia   . Hypertension   . Nocturia   . Perennial allergic rhinitis   . Peyronie's disease   . Skin cancer    Basal and squamous  cell cancers, greater than 20  . Stroke ([place]) 2016  . Tubular adenoma of colon 2010    Past Surgical History:  Procedure Laterality Date  . COLONOSCOPY  2003, 2011   negative  . CORONARY ANGIOPLASTY WITH STENT PLACEMENT  02/06/2012   OM2  bare metal   . EPIDURAL BLOCK INJECTION  04-2015   Dr Brien Few  . epidural steroids  2007, 2009   X 2 @ cervical &, lumbar)  . HERNIA REPAIR    . INTRAVASCULAR ULTRASOUND  02/06/2012   Procedure: INTRAVASCULAR ULTRASOUND;  Surgeon: [name], MD;  Location: [facility] CATH LAB;  Service: Cardiovascular;;  . LEFT HEART CATHETERIZATION WITH CORONARY ANGIOGRAM N/A 02/06/2012   Procedure: LEFT HEART CATHETERIZATION WITH CORONARY ANGIOGRAM;  Surgeon: [name], MD;  Location: [facility] CATH LAB;  Service: Cardiovascular;  Laterality: N/A;  . LUMBAR LAMINECTOMY/DECOMPRESSION MICRODISCECTOMY N/A 10/21/2017   Procedure: Lumbar Two-Three, Lumbar Four-Five Discectomy;  Surgeon: Ashok Pall, MD;  Location: [facility];  Service: Neurosurgery;  Laterality: N/A;  . PERCUTANEOUS CORONARY STENT INTERVENTION (PCI-S)  02/06/2012   Procedure: PERCUTANEOUS CORONARY STENT INTERVENTION (PCI-S);  Surgeon: [name], MD;  Location: [facility] CATH LAB;  Service: Cardiovascular;;  . septoplasty    . TONSILLECTOMY AND ADENOIDECTOMY    . TRIGGER FINGER RELEASE    . UPPER GASTROINTESTINAL ENDOSCOPY  2011   gastric polyps  .  VASECTOMY      There were no vitals filed for this visit.   Subjective Assessment - 12/15/17 0939    Subjective  Patient had decompression surgery 10/21/17. He also has spondylolisthesis. He had 6 HHPT visits. He presently is restricted from doing certain ADLS. Intermittent pain mainly with sleeping on stomach and with slight lumbar flexion.     Pertinent History  laminectomy L2/3, L4/5, spondylolisthesis, CAD (stent), possible stroke    Patient Stated Goals  To get guideposts for what he can and cannont do; to ball room dance    Currently in  Pain?  No/denies         [facility] PT Assessment - 12/15/17 0001      Assessment   Medical Diagnosis  spinal stenosis s/p laminectomy    Referring Provider  Ashok Pall MD    Onset Date/Surgical Date  10/21/17    Next MD Visit  none    Prior Therapy  HHPT x 6  visits      Precautions   Precautions  Back    Precaution Comments  laminectomy      Balance Screen   Has the patient fallen in the past 6 months  No    Has the patient had a decrease in activity level because of a fear of falling?   No    Is the patient reluctant to leave their home because of a fear of falling?   No      Home Film/video editor residence    Additional Comments  2 story; no problem with stairs      Prior Function   Level of Independence  Independent    Vocation  Retired    Gaffer, golf, grandkids      Observation/Other Assessments   Focus on Therapeutic Outcomes (FOTO)   48% limited      Posture/Postural Control   Posture/Postural Control  Postural limitations    Posture Comments  depressed right shoulder, tight right QL      ROM / Strength   AROM / PROM / Strength  AROM;PROM;Strength      AROM   AROM Assessment Site  Lumbar    Lumbar Flexion  hands to knees    Lumbar Extension  full    Lumbar - Right Side Bend  decreased 75% with pain    Lumbar - Left Side Bend  decreased 50% tight on R    Lumbar - Right Rotation  WFL in supine    Lumbar - Left Rotation  WFL in supine      Strength   Overall Strength Comments  grossly 5/5 in BLE      Flexibility   Soft Tissue Assessment /Muscle Length  yes    Hamstrings  Bil marked tightness    Quadriceps  bil and R hip flexor    ITB  L +ober    Piriformis  Bil marked R>L    Quadratus Lumborum  Right       Palpation   Palpation comment  R gluteals and QL increased tone                Objective measurements completed on examination: See above findings.              PT Education -  12/15/17 1228    Education Details  HEP; self care: discussed general timeline and precautions for return to higher level activity. Advised to use pillow under  waist/hips if sleeping on stomach.    Person(s) Educated  Patient    Methods  Explanation;Demonstration;Handout    Comprehension  Verbalized understanding;Returned demonstration       PT Short Term Goals - 12/15/17 1240      PT SHORT TERM GOAL #1   Title  Independent with initial HEP    Time  2    Period  Weeks    Status  New    Target Date  12/29/17      PT SHORT TERM GOAL #2   Title  Patient to understand the importance of a neutral spine to protect his spondylosis and laminectomies.    Time  2    Period  Weeks    Status  New    Target Date  12/29/17        PT Long Term Goals - 12/15/17 1253      PT LONG TERM GOAL #1   Title  Independent with advanced HEP/gym program as indicated including flexibility    Time  6    Period  Weeks    Status  New      PT LONG TERM GOAL #2   Title  Pt able to demonstrate/verbalize correct body mechanics with ADLS to prevent further injury.     Time  6    Period  Weeks    Status  New      PT LONG TERM GOAL #3   Title  Patient able to perform rotational strengthening activities in the spine with good form to allow return to ballroom dancing.    Time  6    Period  Weeks    Status  New      PT LONG TERM GOAL #4   Title  Patient able to sleep without waking from back pain.    Time  6    Period  Weeks    Status  New             Plan - 12/15/17 1230    Clinical Impression Statement  Patient presents for low complexity evaluation for spinal stenosis s/p lumbar laminectomy on 10/21/17. He also has spondylolistheis. He has a tight R QL depressing R shoulder.  He is limited with lumbar ROM and has significant flexibility deficits in bil hips and legs. He has good base core strength with MMT. Patient has intermittent pain. Patient's main goal is to know when it is safe to due  various activities including ball room dancing, weeding and golf.    History and Personal Factors relevant to plan of care:  lumbar laminectomy L2/3, L4/5; lumbar spondylolisthesis, CAD (stent), possible stroke    Clinical Presentation  Stable    Clinical Decision Making  Low    Rehab Potential  Excellent    PT Frequency  2x / week    PT Duration  6 weeks    PT Treatment/Interventions  ADLs/Self Care Home Management;Electrical Stimulation;Moist Heat;Therapeutic exercise;Therapeutic activities;Neuromuscular re-education;Patient/family education;Manual techniques;Dry needling    PT Next Visit Plan  review HEP, work on flexibility (QL, piriformis, HF, HS), ADL modifications; lumbar stab    PT Home Exercise Plan  SKTC, DKTC, bridge, LTR       Patient will benefit from skilled therapeutic intervention in order to improve the following deficits and impairments:  Pain, Postural dysfunction, Decreased range of motion, Decreased strength, Impaired flexibility  Visit Diagnosis: Chronic midline low back pain, with sciatica presence unspecified - Plan: PT plan of care cert/re-cert  Abnormal posture -  Plan: PT plan of care cert/re-cert     Problem List Patient Active Problem List   Diagnosis Date Noted  . HNP (herniated nucleus pulposus), lumbar 10/21/2017  . Viral upper respiratory tract infection with cough 07/06/2017  . Current use of beta blocker 08/09/2015  . Moderate persistent asthma without complication 42/70/6237  . Other allergic rhinitis 02/20/2015  . PCP NOTES >>> 01/11/2015  . Cerebellar infarct ([place]) 06/26/2014  . BPH (benign prostatic hyperplasia) 06/22/2014  . Annual physical exam 02/20/2014  . Amnesia 02/20/2014  . Hyperglycemia 02/05/2013  . Benign paroxysmal positional vertigo 11/19/2012  . CAD (coronary artery disease) 02/07/2012  . Gastroesophageal reflux disease without esophagitis 01/01/2009  . SKIN CANCER, HX OF 12/28/2007  . Hyperlipidemia 06/16/2007  . Essential  hypertension 06/16/2007  . Asthma, chronic 06/16/2007  . LUMBOSACRAL RADICULOPATHY 03/01/2007  . NEPHROLITHIASIS, HX OF 05/14/2006    Louella Medaglia PT 12/15/2017, 3:31 PM  [facility] [address]  [place], [place], [address] Phone: [phone]   Fax:  [phone]  Name: George Alvarez MRN: [id number] Date of Birth: [birth year]

## 2017-12-16 DIAGNOSIS — R972 Elevated prostate specific antigen [PSA]: Secondary | ICD-10-CM | POA: Diagnosis not present

## 2017-12-18 ENCOUNTER — Ambulatory Visit: Payer: PPO

## 2017-12-18 VITALS — BP 136/84

## 2017-12-18 DIAGNOSIS — M545 Low back pain: Secondary | ICD-10-CM | POA: Diagnosis not present

## 2017-12-18 DIAGNOSIS — R293 Abnormal posture: Secondary | ICD-10-CM

## 2017-12-18 DIAGNOSIS — G8929 Other chronic pain: Secondary | ICD-10-CM

## 2017-12-18 NOTE — Therapy (Addendum)
[facility] [address]  [place], [place], [address] Phone: [phone]   Fax:  [phone]  Physical Therapy Treatment  Patient Details  Name: George Alvarez MRN: [id number] Date of Birth: 1945/08/22 Referring Provider: Ashok Pall MD   Encounter Date: 12/18/2017  PT End of Session - 12/18/17 1100    Visit Number  2    Number of Visits  12    Date for PT Re-Evaluation  01/26/18    PT Start Time  1055    PT Stop Time  1140    PT Time Calculation (min)  45 min    Activity Tolerance  Patient tolerated treatment well    Behavior During Therapy  [facility] for tasks assessed/performed       Past Medical History:  Diagnosis Date  . Anal fissure   . Arthritis   . Asthma, chronic 06/16/2007   Qualifier: Diagnosis of  By: Linna Darner MD, Gwyndolyn Saxon   Onset:as child Triggers (environmental, infectious, allergic): all, mainly environmental triggers Rescue inhaler EHU:DJSHFW Maintenance medications/ response:Singulair,Qvar Smoking history:never Family history pulmonary disease: no    . Basal cell carcinoma of chest wall 2016   1.8 cm, treated with electrodessication and currettage  . Benign paroxysmal positional vertigo 11/19/2012   Diagnosed at [facility]  Physical therapy appointment pending   . BPH (benign prostatic hyperplasia)    With urinary obstruction  . Coronary artery disease    a. 02/2012 Cath/PCI: LM 10, LAD min irregs, LCX large, OM1 sm, 40 ost, OM2 95p (5.0x16 Veriflex & 4.5x12 Veriflex BMS'), RCA 30p, 63m, 30d, PDA/PLA min irregs, EF 65%  . Diverticulosis   . Erectile dysfunction due to arterial insufficiency   . Fundic gland polyps of stomach, benign 2010  . GERD (gastroesophageal reflux disease)   . History of elevated PSA   . History of kidney stones   . Hyperlipidemia   . Hypertension   . Nocturia   . Perennial allergic rhinitis   . Peyronie's disease   . Skin cancer    Basal and squamous  cell cancers, greater than 20  . Stroke ([place]) 2016  . Tubular adenoma of colon 2010    Past Surgical History:  Procedure Laterality Date  . COLONOSCOPY  2003, 2011   negative  . CORONARY ANGIOPLASTY WITH STENT PLACEMENT  02/06/2012   OM2  bare metal   . EPIDURAL BLOCK INJECTION  04-2015   Dr Brien Few  . epidural steroids  2007, 2009   X 2 @ cervical &, lumbar)  . HERNIA REPAIR    . INTRAVASCULAR ULTRASOUND  02/06/2012   Procedure: INTRAVASCULAR ULTRASOUND;  Surgeon: [name], MD;  Location: [facility] CATH LAB;  Service: Cardiovascular;;  . LEFT HEART CATHETERIZATION WITH CORONARY ANGIOGRAM N/A 02/06/2012   Procedure: LEFT HEART CATHETERIZATION WITH CORONARY ANGIOGRAM;  Surgeon: [name], MD;  Location: [facility] CATH LAB;  Service: Cardiovascular;  Laterality: N/A;  . LUMBAR LAMINECTOMY/DECOMPRESSION MICRODISCECTOMY N/A 10/21/2017   Procedure: Lumbar Two-Three, Lumbar Four-Five Discectomy;  Surgeon: Ashok Pall, MD;  Location: [facility];  Service: Neurosurgery;  Laterality: N/A;  . PERCUTANEOUS CORONARY STENT INTERVENTION (PCI-S)  02/06/2012   Procedure: PERCUTANEOUS CORONARY STENT INTERVENTION (PCI-S);  Surgeon: [name], MD;  Location: [facility] CATH LAB;  Service: Cardiovascular;;  . septoplasty    . TONSILLECTOMY AND ADENOIDECTOMY    . TRIGGER FINGER RELEASE    . UPPER GASTROINTESTINAL ENDOSCOPY  2011   gastric polyps  .  VASECTOMY      Vitals:   12/18/17 1116  BP: 136/84    Subjective Assessment - 12/18/17 1058    Subjective  Pt. noting he has been having some dizziness and nausea yesterday while rising from laying on back following performing HEP.  Pt. denies spinning sensation with this recent episode.      Pertinent History  laminectomy L2/3, L4/5, spondylolisthesis, CAD (stent), possible stroke    Patient Stated Goals  To get guideposts for what he can and cannont do; to ball room dance    Currently in Pain?  No/denies    Multiple Pain Sites  No                        [facility] Adult PT Treatment/Exercise - 12/18/17 1121      Lumbar Exercises: Stretches   Single Knee to Chest Stretch  Right;Left;2 reps;20 seconds    Lower Trunk Rotation  5 reps;10 seconds   well tolerated    Piriformis Stretch  Right;Left;1 rep;30 seconds    Piriformis Stretch Limitations  KTOS      Lumbar Exercises: Aerobic   Nustep  Lvl 4, 6 min       Lumbar Exercises: Supine   Bridge  10 reps;3 seconds    Bridge Limitations  Cues for proper breathing pattern to avoid holding breath       Knee/Hip Exercises: Standing   Heel Raises  Both;10 reps;3 seconds   Cues required for slow performance to ensure proper activati   Heel Raises Limitations  heel to toe     Knee Flexion  Right;Left;10 reps;Strengthening    Knee Flexion Limitations  Cues required for slow performance to ensure proper muscular activation     Hip Flexion  Right;Left;10 reps;Knee bent;Stengthening    Hip Flexion Limitations  emphasis on slow march     Hip Abduction  Right;Left;Knee straight   12 reps; cues required for pacing    Abduction Limitations  chair    Hip Extension  Right;Left;Knee straight   12 reps; cues required for pacing    Extension Limitations  chair     Wall Squat  10 reps;3 seconds    Wall Squat Limitations  Cues required for posture    and cues required for pacing             PT Education - 12/18/17 1200    Education Details  HEP update    Person(s) Educated  Patient    Methods  Explanation;Demonstration;Handout;Verbal cues    Comprehension  Verbalized understanding;Returned demonstration;Verbal cues required;Need further instruction       PT Short Term Goals - 12/18/17 1101      PT SHORT TERM GOAL #1   Title  Independent with initial HEP    Time  2    Period  Weeks    Status  On-going      PT SHORT TERM GOAL #2   Title  Patient to understand the importance of a neutral spine to protect his spondylosis and laminectomies.    Time  2     Period  Weeks    Status  On-going        PT Long Term Goals - 12/18/17 1101      PT LONG TERM GOAL #1   Title  Independent with advanced HEP/gym program as indicated including flexibility    Time  6    Period  Weeks    Status  On-going  PT LONG TERM GOAL #2   Title  Pt able to demonstrate/verbalize correct body mechanics with ADLS to prevent further injury.     Time  6    Period  Weeks    Status  On-going      PT LONG TERM GOAL #3   Title  Patient able to perform rotational strengthening activities in the spine with good form to allow return to ballroom dancing.    Time  6    Period  Weeks    Status  On-going      PT LONG TERM GOAL #4   Title  Patient able to sleep without waking from back pain.    Time  6    Period  Weeks    Status  On-going            Plan - 12/18/17 1101    Clinical Impression Statement  Pt. primary complaint today was dizziness and nausea following rising from supine positioning, which he attributes to medications however was unsure if related to "vertigo" from previous episode a few years back.  Supervising PT performing [employer] test B which was negative and asymptomatic.  Pt. encouraged to reach out to MD if this continues.  Pt. noting he is currently performing HHPT HEP thoracic extension, standing hip abd, ext. kicks, heel toe rocks, marching knee bent, butt kicks.  Performed these activities in session today along with initial HEP issued to pt. last session with pt. tolerating all activities well however requiring heavy cueing for slow performance to ensure proper muscular activation and appropriate stretch.  Pt. ended visit pain free thus modalities deferred.  Will continue to progress.        PT Treatment/Interventions  ADLs/Self Care Home Management;Electrical Stimulation;Moist Heat;Therapeutic exercise;Therapeutic activities;Neuromuscular re-education;Patient/family education;Manual techniques;Dry needling    PT Home Exercise Plan   SKTC, DKTC, bridge, LTR    Consulted and Agree with Plan of Care  Patient       Patient will benefit from skilled therapeutic intervention in order to improve the following deficits and impairments:  Pain, Postural dysfunction, Decreased range of motion, Decreased strength, Impaired flexibility  Visit Diagnosis: Chronic midline low back pain, with sciatica presence unspecified  Abnormal posture       Problem List Patient Active Problem List   Diagnosis Date Noted  . HNP (herniated nucleus pulposus), lumbar 10/21/2017  . Viral upper respiratory tract infection with cough 07/06/2017  . Current use of beta blocker 08/09/2015  . Moderate persistent asthma without complication 29/92/4268  . Other allergic rhinitis 02/20/2015  . PCP NOTES >>> 01/11/2015  . Cerebellar infarct ([place]) 06/26/2014  . BPH (benign prostatic hyperplasia) 06/22/2014  . Annual physical exam 02/20/2014  . Amnesia 02/20/2014  . Hyperglycemia 02/05/2013  . Benign paroxysmal positional vertigo 11/19/2012  . CAD (coronary artery disease) 02/07/2012  . Gastroesophageal reflux disease without esophagitis 01/01/2009  . SKIN CANCER, HX OF 12/28/2007  . Hyperlipidemia 06/16/2007  . Essential hypertension 06/16/2007  . Asthma, chronic 06/16/2007  . LUMBOSACRAL RADICULOPATHY 03/01/2007  . NEPHROLITHIASIS, HX OF 05/14/2006   Bess Harvest, PTA 12/18/17 12:01 PM   [facility] [address]  [place], [place], [address] Phone: [phone]   Fax:  [phone]  Name: George Alvarez MRN: [id number] Date of Birth: 06/16/1945

## 2017-12-22 ENCOUNTER — Ambulatory Visit: Payer: PPO

## 2017-12-22 DIAGNOSIS — G8929 Other chronic pain: Secondary | ICD-10-CM

## 2017-12-22 DIAGNOSIS — R293 Abnormal posture: Secondary | ICD-10-CM

## 2017-12-22 DIAGNOSIS — M545 Low back pain: Principal | ICD-10-CM

## 2017-12-22 NOTE — Therapy (Addendum)
[facility] [address]  [place], [place], [address] Phone: [phone]   Fax:  [phone]  Physical Therapy Treatment  Patient Details  Name: George Alvarez MRN: [id number] Date of Birth: 04-Dec-1945 Referring Provider: Ashok Pall MD   Encounter Date: 12/22/2017  PT End of Session - 12/22/17 1106    Visit Number  3    Number of Visits  12    Date for PT Re-Evaluation  01/26/18    PT Start Time  1100    PT Stop Time  1200    PT Time Calculation (min)  60 min    Activity Tolerance  Patient tolerated treatment well    Behavior During Therapy  [facility] for tasks assessed/performed       Past Medical History:  Diagnosis Date  . Anal fissure   . Arthritis   . Asthma, chronic 06/16/2007   Qualifier: Diagnosis of  By: George Darner MD, Gwyndolyn Saxon   Onset:as child Triggers (environmental, infectious, allergic): all, mainly environmental triggers Rescue inhaler YIF:[id number] Maintenance medications/ response:Singulair,Qvar Smoking history:never Family history pulmonary disease: no    . Basal cell carcinoma of chest wall 2016   1.8 cm, treated with electrodessication and currettage  . Benign paroxysmal positional vertigo 11/19/2012   Diagnosed at [facility], [facility]  Physical therapy appointment pending   . BPH (benign prostatic hyperplasia)    With urinary obstruction  . Coronary artery disease    a. 02/2012 Cath/PCI: LM 10, LAD min irregs, LCX large, OM1 sm, 40 ost, OM2 95p (5.0x16 Veriflex & 4.5x12 Veriflex BMS'), RCA 30p, 44m, 30d, PDA/PLA min irregs, EF 65%  . Diverticulosis   . Erectile dysfunction due to arterial insufficiency   . Fundic gland polyps of stomach, benign 2010  . GERD (gastroesophageal reflux disease)   . History of elevated PSA   . History of kidney stones   . Hyperlipidemia   . Hypertension   . Nocturia   . Perennial allergic rhinitis   . Peyronie's disease   . Skin cancer    Basal and squamous  cell cancers, greater than 20  . Stroke ([place]) 2016  . Tubular adenoma of colon 2010    Past Surgical History:  Procedure Laterality Date  . COLONOSCOPY  2003, 2011   negative  . CORONARY ANGIOPLASTY WITH STENT PLACEMENT  02/06/2012   OM2  bare metal   . EPIDURAL BLOCK INJECTION  04-2015   Dr Brien Few  . epidural steroids  2007, 2009   X 2 @ cervical &, lumbar)  . HERNIA REPAIR    . INTRAVASCULAR ULTRASOUND  02/06/2012   Procedure: INTRAVASCULAR ULTRASOUND;  Surgeon: [name], MD;  Location: [facility] CATH LAB;  Service: Cardiovascular;;  . LEFT HEART CATHETERIZATION WITH CORONARY ANGIOGRAM N/A 02/06/2012   Procedure: LEFT HEART CATHETERIZATION WITH CORONARY ANGIOGRAM;  Surgeon: [name], MD;  Location: [facility] CATH LAB;  Service: Cardiovascular;  Laterality: N/A;  . LUMBAR LAMINECTOMY/DECOMPRESSION MICRODISCECTOMY N/A 10/21/2017   Procedure: Lumbar Two-Three, Lumbar Four-Five Discectomy;  Surgeon: Ashok Pall, MD;  Location: [facility];  Service: Neurosurgery;  Laterality: N/A;  . PERCUTANEOUS CORONARY STENT INTERVENTION (PCI-S)  02/06/2012   Procedure: PERCUTANEOUS CORONARY STENT INTERVENTION (PCI-S);  Surgeon: [name], MD;  Location: [facility] CATH LAB;  Service: Cardiovascular;;  . septoplasty    . TONSILLECTOMY AND ADENOIDECTOMY    . TRIGGER FINGER RELEASE    . UPPER GASTROINTESTINAL ENDOSCOPY  2011   gastric polyps  .  VASECTOMY      There were no vitals filed for this visit.  Subjective Assessment - 12/22/17 1105    Subjective  Pt. reporting some dull muscular soreness after last visit which subsided next day.      Pertinent History  laminectomy L2/3, L4/5, spondylolisthesis, CAD (stent), possible stroke    Patient Stated Goals  To get guideposts for what he can and cannont do; to ball room dance    Currently in Pain?  No/denies    Multiple Pain Sites  No                       [facility] Adult PT Treatment/Exercise - 12/22/17 1113      Lumbar  Exercises: Stretches   Passive Hamstring Stretch  Right;Left;1 rep;30 seconds    Passive Hamstring Stretch Limitations  Seated B     Lower Trunk Rotation  5 reps;10 seconds      Lumbar Exercises: Aerobic   Recumbent Bike  Lvl 2, 6 min      Lumbar Exercises: Machines for Strengthening   Other Lumbar Machine Exercise  B low row 10# 5" x 15 reps    Cues required for proper hold and scap. retraction    Other Lumbar Machine Exercise  BATCA lat pulldown 15# x 10 rpes       Lumbar Exercises: Standing   Row  Both;15 reps;Theraband    Theraband Level (Row)  Level 2 (Red)    Row Limitations  Cues required for scapular retraction       Lumbar Exercises: Seated   Hip Flexion on Ball  Right;Left;10 reps      Lumbar Exercises: Supine   Clam  10 reps;3 seconds    Clam Limitations  Hooklying with red TB at knees    Bridge with clamshell  10 reps;3 seconds    Bridge with [employer] Limitations  with isometric hip abd/ER into red TB       Moist Heat Therapy   Number Minutes Moist Heat  10 Minutes    Moist Heat Location  Lumbar Spine      Electrical Stimulation   Electrical Stimulation Location  lumbar spine R-sided    Electrical Stimulation Action  IFC    Electrical Stimulation Parameters  to tolerance, 10'    Electrical Stimulation Goals  Pain             PT Education - 12/22/17 1159    Education Details  HEP update     Person(s) Educated  Patient    Methods  Explanation;Demonstration;Verbal cues;Handout    Comprehension  Verbalized understanding;Returned demonstration;Verbal cues required;Need further instruction       PT Short Term Goals - 12/18/17 1101      PT SHORT TERM GOAL #1   Title  Independent with initial HEP    Time  2    Period  Weeks    Status  On-going      PT SHORT TERM GOAL #2   Title  Patient to understand the importance of a neutral spine to protect his spondylosis and laminectomies.    Time  2    Period  Weeks    Status  On-going        PT  Long Term Goals - 12/18/17 1101      PT LONG TERM GOAL #1   Title  Independent with advanced HEP/gym program as indicated including flexibility    Time  6    Period  Weeks    Status  On-going      PT LONG TERM GOAL #2   Title  Pt able to demonstrate/verbalize correct body mechanics with ADLS to prevent further injury.     Time  6    Period  Weeks    Status  On-going      PT LONG TERM GOAL #3   Title  Patient able to perform rotational strengthening activities in the spine with good form to allow return to ballroom dancing.    Time  6    Period  Weeks    Status  On-going      PT LONG TERM GOAL #4   Title  Patient able to sleep without waking from back pain.    Time  6    Period  Weeks    Status  On-going            Plan - 12/22/17 1120    Clinical Impression Statement  George Alvarez noting only mild musculature soreness in LEs following last session.  With plans to start water aerobics at [facility].  Tolerated progression of supine lumbopelvic strengthening and gentle introduction to machine strengthening activities well today.  Had mild LBP following session thus ended with E-stim/moist heat to lumbar spine with complete relief following this.  HEP updated.  Will continue to progress.      PT Treatment/Interventions  ADLs/Self Care Home Management;Electrical Stimulation;Moist Heat;Therapeutic exercise;Therapeutic activities;Neuromuscular re-education;Patient/family education;Manual techniques;Dry needling    PT Home Exercise Plan  SKTC, DKTC, bridge, LTR    Consulted and Agree with Plan of Care  Patient       Patient will benefit from skilled therapeutic intervention in order to improve the following deficits and impairments:  Pain, Postural dysfunction, Decreased range of motion, Decreased strength, Impaired flexibility  Visit Diagnosis: Chronic midline low back pain, with sciatica presence unspecified  Abnormal posture       Problem List Patient Active Problem List    Diagnosis Date Noted  . HNP (herniated nucleus pulposus), lumbar 10/21/2017  . Viral upper respiratory tract infection with cough 07/06/2017  . Current use of beta blocker 08/09/2015  . Moderate persistent asthma without complication 82/80/0349  . Other allergic rhinitis 02/20/2015  . PCP NOTES >>> 01/11/2015  . Cerebellar infarct ([place]) 06/26/2014  . BPH (benign prostatic hyperplasia) 06/22/2014  . Annual physical exam 02/20/2014  . Amnesia 02/20/2014  . Hyperglycemia 02/05/2013  . Benign paroxysmal positional vertigo 11/19/2012  . CAD (coronary artery disease) 02/07/2012  . Gastroesophageal reflux disease without esophagitis 01/01/2009  . SKIN CANCER, HX OF 12/28/2007  . Hyperlipidemia 06/16/2007  . Essential hypertension 06/16/2007  . Asthma, chronic 06/16/2007  . LUMBOSACRAL RADICULOPATHY 03/01/2007  . NEPHROLITHIASIS, HX OF 05/14/2006   Bess Harvest, PTA 12/22/17 12:14 PM   [facility] [address]  [place], [place], [address] Phone: [phone]   Fax:  [phone]  Name: George Alvarez MRN: [id number] Date of Birth: 09/26/1945

## 2017-12-23 ENCOUNTER — Encounter: Payer: Self-pay | Admitting: Internal Medicine

## 2017-12-23 DIAGNOSIS — R972 Elevated prostate specific antigen [PSA]: Secondary | ICD-10-CM | POA: Diagnosis not present

## 2017-12-23 DIAGNOSIS — N401 Enlarged prostate with lower urinary tract symptoms: Secondary | ICD-10-CM | POA: Diagnosis not present

## 2017-12-23 DIAGNOSIS — R3912 Poor urinary stream: Secondary | ICD-10-CM | POA: Diagnosis not present

## 2017-12-23 DIAGNOSIS — N5201 Erectile dysfunction due to arterial insufficiency: Secondary | ICD-10-CM | POA: Diagnosis not present

## 2017-12-25 ENCOUNTER — Ambulatory Visit: Payer: PPO

## 2017-12-25 DIAGNOSIS — M545 Low back pain: Principal | ICD-10-CM

## 2017-12-25 DIAGNOSIS — G8929 Other chronic pain: Secondary | ICD-10-CM

## 2017-12-25 DIAGNOSIS — R293 Abnormal posture: Secondary | ICD-10-CM

## 2017-12-25 NOTE — Patient Instructions (Signed)

## 2017-12-25 NOTE — Therapy (Addendum)
[facility] [address]  [place], [place], [address] Phone: [phone]   Fax:  [phone]  Physical Therapy Treatment  Patient Details  Name: George Alvarez MRN: [id number] Date of Birth: [birth year] Referring Provider: Ashok Pall MD   Encounter Date: 12/25/2017  PT End of Session - 12/25/17 1106    Visit Number  4    Number of Visits  12    Date for PT Re-Evaluation  01/26/18    PT Start Time  1101    PT Stop Time  1155   10 min moist to end session    PT Time Calculation (min)  54 min    Activity Tolerance  Patient tolerated treatment well    Behavior During Therapy  [facility] for tasks assessed/performed       Past Medical History:  Diagnosis Date  . Anal fissure   . Arthritis   . Asthma, chronic 06/16/2007   Qualifier: Diagnosis of  By: Linna Darner MD, Gwyndolyn Saxon   Onset:as child Triggers (environmental, infectious, allergic): all, mainly environmental triggers Rescue inhaler EQA:STMHDQ Maintenance medications/ response:Singulair,Qvar Smoking history:never Family history pulmonary disease: no    . Basal cell carcinoma of chest wall 2016   1.8 cm, treated with electrodessication and currettage  . Benign paroxysmal positional vertigo 11/19/2012   Diagnosed at [facility]  Physical therapy appointment pending   . BPH (benign prostatic hyperplasia)    With urinary obstruction  . Coronary artery disease    a. 02/2012 Cath/PCI: LM 10, LAD min irregs, LCX large, OM1 sm, 40 ost, OM2 95p (5.0x16 Veriflex & 4.5x12 Veriflex BMS'), RCA 30p, 44m, 30d, PDA/PLA min irregs, EF 65%  . Diverticulosis   . Erectile dysfunction due to arterial insufficiency   . Fundic gland polyps of stomach, benign 2010  . GERD (gastroesophageal reflux disease)   . History of elevated PSA   . History of kidney stones   . Hyperlipidemia   . Hypertension   . Nocturia   . Perennial allergic rhinitis   . Peyronie's disease   . Skin  cancer    Basal and squamous cell cancers, greater than 20  . Stroke ([place]) 2016  . Tubular adenoma of colon 2010    Past Surgical History:  Procedure Laterality Date  . COLONOSCOPY  2003, 2011   negative  . CORONARY ANGIOPLASTY WITH STENT PLACEMENT  02/06/2012   OM2  bare metal   . EPIDURAL BLOCK INJECTION  04-2015   Dr Brien Few  . epidural steroids  2007, 2009   X 2 @ cervical &, lumbar)  . HERNIA REPAIR    . INTRAVASCULAR ULTRASOUND  02/06/2012   Procedure: INTRAVASCULAR ULTRASOUND;  Surgeon: [name], MD;  Location: [facility] CATH LAB;  Service: Cardiovascular;;  . LEFT HEART CATHETERIZATION WITH CORONARY ANGIOGRAM N/A 02/06/2012   Procedure: LEFT HEART CATHETERIZATION WITH CORONARY ANGIOGRAM;  Surgeon: [name], MD;  Location: [facility] CATH LAB;  Service: Cardiovascular;  Laterality: N/A;  . LUMBAR LAMINECTOMY/DECOMPRESSION MICRODISCECTOMY N/A 10/21/2017   Procedure: Lumbar Two-Three, Lumbar Four-Five Discectomy;  Surgeon: Ashok Pall, MD;  Location: [place];  Service: Neurosurgery;  Laterality: N/A;  . PERCUTANEOUS CORONARY STENT INTERVENTION (PCI-S)  02/06/2012   Procedure: PERCUTANEOUS CORONARY STENT INTERVENTION (PCI-S);  Surgeon: [name], MD;  Location: [facility] CATH LAB;  Service: Cardiovascular;;  . septoplasty    . TONSILLECTOMY AND ADENOIDECTOMY    . TRIGGER FINGER RELEASE    . UPPER GASTROINTESTINAL  ENDOSCOPY  2011   gastric polyps  . VASECTOMY      There were no vitals filed for this visit.  Subjective Assessment - 12/25/17 1109    Subjective  Pt. reporting some issues with pallof press at home.  Pt. noting MD cut medication which he states made him dizzy to half dose which he feels is helping reduce dizziness even after only two days.      Pertinent History  laminectomy L2/3, L4/5, spondylolisthesis, CAD (stent), possible stroke    Patient Stated Goals  To get guideposts for what he can and cannont do; to ball room dance    Currently in Pain?   No/denies    Multiple Pain Sites  No                       [facility] Adult PT Treatment/Exercise - 12/25/17 1127      Self-Care   Self-Care  Other Self-Care Comments    Other Self-Care Comments   REviewed proper body mechanics and posture with daily activities as to reduce lumbar strain       Lumbar Exercises: Aerobic   Recumbent Bike  Lvl 3, 6 min      Lumbar Exercises: Standing   Other Standing Lumbar Exercises  B pallof press with one green TB closed in door x 12 reps       Lumbar Exercises: Supine   Clam  3 seconds   x 12 rpes    Clam Limitations  Hooklying with red TB at knees    Bridge with clamshell  3 seconds   x 12 rpes    Bridge with [employer] Limitations  with isometric hip abd/ER into red TB       Moist Heat Therapy   Number Minutes Moist Heat  10 Minutes    Moist Heat Location  Hip   R     Manual Therapy   Manual Therapy  Soft tissue mobilization;Myofascial release    Manual therapy comments  sidelying     Soft tissue mobilization  STM to R superior buttocks, piriformis     Myofascial Release  TPR to R superior buttocks              PT Education - 12/25/17 1203    Education Details  Posture and body mechanics handout    Person(s) Educated  Patient    Methods  Explanation;Demonstration;Verbal cues;Handout    Comprehension  Verbalized understanding;Returned demonstration;Verbal cues required;Need further instruction       PT Short Term Goals - 12/18/17 1101      PT SHORT TERM GOAL #1   Title  Independent with initial HEP    Time  2    Period  Weeks    Status  On-going      PT SHORT TERM GOAL #2   Title  Patient to understand the importance of a neutral spine to protect his spondylosis and laminectomies.    Time  2    Period  Weeks    Status  On-going        PT Long Term Goals - 12/18/17 1101      PT LONG TERM GOAL #1   Title  Independent with advanced HEP/gym program as indicated including flexibility    Time  6     Period  Weeks    Status  On-going      PT LONG TERM GOAL #2   Title  Pt able to demonstrate/verbalize correct  body mechanics with ADLS to prevent further injury.     Time  6    Period  Weeks    Status  On-going      PT LONG TERM GOAL #3   Title  Patient able to perform rotational strengthening activities in the spine with good form to allow return to ballroom dancing.    Time  6    Period  Weeks    Status  On-going      PT LONG TERM GOAL #4   Title  Patient able to sleep without waking from back pain.    Time  6    Period  Weeks    Status  On-going            Plan - 12/25/17 1108    Clinical Impression Statement  Pt. reporting some confusion on technique with Pallof Press however able to demo good technique following review today.  Session focused on proximal hip manual therapy and gentle R buttocks strengthening as pt. noting some tenderness in this area.  Pt. ended visit with moist heat and was pain free following this.  Will continue to progress toward goals.      PT Treatment/Interventions  ADLs/Self Care Home Management;Electrical Stimulation;Moist Heat;Therapeutic exercise;Therapeutic activities;Neuromuscular re-education;Patient/family education;Manual techniques;Dry needling    PT Home Exercise Plan  SKTC, DKTC, bridge, LTR    Consulted and Agree with Plan of Care  Patient       Patient will benefit from skilled therapeutic intervention in order to improve the following deficits and impairments:  Pain, Postural dysfunction, Decreased range of motion, Decreased strength, Impaired flexibility  Visit Diagnosis: Chronic midline low back pain, with sciatica presence unspecified  Abnormal posture        Problem List Patient Active Problem List   Diagnosis Date Noted  . HNP (herniated nucleus pulposus), lumbar 10/21/2017  . Viral upper respiratory tract infection with cough 07/06/2017  . Current use of beta blocker 08/09/2015  . Moderate persistent asthma  without complication 58/12/9831  . Other allergic rhinitis 02/20/2015  . PCP NOTES >>> 01/11/2015  . Cerebellar infarct ([place]) 06/26/2014  . BPH (benign prostatic hyperplasia) 06/22/2014  . Annual physical exam 02/20/2014  . Amnesia 02/20/2014  . Hyperglycemia 02/05/2013  . Benign paroxysmal positional vertigo 11/19/2012  . CAD (coronary artery disease) 02/07/2012  . Gastroesophageal reflux disease without esophagitis 01/01/2009  . SKIN CANCER, HX OF 12/28/2007  . Hyperlipidemia 06/16/2007  . Essential hypertension 06/16/2007  . Asthma, chronic 06/16/2007  . LUMBOSACRAL RADICULOPATHY 03/01/2007  . NEPHROLITHIASIS, HX OF 05/14/2006    Bess Harvest, PTA 12/25/17 12:05 PM   [facility] [address]  [place], [place], [address] Phone: [phone]   Fax:  [phone]  Name: PRABHAV FAULKENBERRY MRN: [id number] Date of Birth: 1946-03-09

## 2017-12-29 ENCOUNTER — Ambulatory Visit: Payer: PPO | Admitting: Physical Therapy

## 2017-12-29 ENCOUNTER — Encounter: Payer: Self-pay | Admitting: Physical Therapy

## 2017-12-29 DIAGNOSIS — G8929 Other chronic pain: Secondary | ICD-10-CM

## 2017-12-29 DIAGNOSIS — R293 Abnormal posture: Secondary | ICD-10-CM

## 2017-12-29 DIAGNOSIS — M545 Low back pain: Secondary | ICD-10-CM | POA: Diagnosis not present

## 2017-12-29 NOTE — Therapy (Signed)
[facility] [address]  [place], [place], [address] Phone: [phone]   Fax:  [phone]  Physical Therapy Treatment  Patient Details  Name: George Alvarez MRN: [id number] Date of Birth: 03-13-1946 Referring Provider: Ashok Pall MD   Encounter Date: 12/29/2017  PT End of Session - 12/29/17 1643    Visit Number  5    Number of Visits  12    Date for PT Re-Evaluation  01/26/18    PT Start Time  1404    PT Stop Time  1444    PT Time Calculation (min)  40 min    Activity Tolerance  Patient tolerated treatment well    Behavior During Therapy  [facility] for tasks assessed/performed       Past Medical History:  Diagnosis Date  . Anal fissure   . Arthritis   . Asthma, chronic 06/16/2007   Qualifier: Diagnosis of  By: Linna Darner MD, Gwyndolyn Saxon   Onset:as child Triggers (environmental, infectious, allergic): all, mainly environmental triggers Rescue inhaler KZS:[id number] Maintenance medications/ response:Singulair,Qvar Smoking history:never Family history pulmonary disease: no    . Basal cell carcinoma of chest wall 2016   1.8 cm, treated with electrodessication and currettage  . Benign paroxysmal positional vertigo 11/19/2012   Diagnosed at [facility]  Physical therapy appointment pending   . BPH (benign prostatic hyperplasia)    With urinary obstruction  . Coronary artery disease    a. 02/2012 Cath/PCI: LM 10, LAD min irregs, LCX large, OM1 sm, 40 ost, OM2 95p (5.0x16 Veriflex & 4.5x12 Veriflex BMS'), RCA 30p, 58m, 30d, PDA/PLA min irregs, EF 65%  . Diverticulosis   . Erectile dysfunction due to arterial insufficiency   . Fundic gland polyps of stomach, benign 2010  . GERD (gastroesophageal reflux disease)   . History of elevated PSA   . History of kidney stones   . Hyperlipidemia   . Hypertension   . Nocturia   . Perennial allergic rhinitis   . Peyronie's disease   . Skin cancer    Basal and squamous  cell cancers, greater than 20  . Stroke ([place]) 2016  . Tubular adenoma of colon 2010    Past Surgical History:  Procedure Laterality Date  . COLONOSCOPY  2003, 2011   negative  . CORONARY ANGIOPLASTY WITH STENT PLACEMENT  02/06/2012   OM2  bare metal   . EPIDURAL BLOCK INJECTION  04-2015   Dr Brien Few  . epidural steroids  2007, 2009   X 2 @ cervical &, lumbar)  . HERNIA REPAIR    . INTRAVASCULAR ULTRASOUND  02/06/2012   Procedure: INTRAVASCULAR ULTRASOUND;  Surgeon: [name], MD;  Location: [facility] CATH LAB;  Service: Cardiovascular;;  . LEFT HEART CATHETERIZATION WITH CORONARY ANGIOGRAM N/A 02/06/2012   Procedure: LEFT HEART CATHETERIZATION WITH CORONARY ANGIOGRAM;  Surgeon: [name], MD;  Location: [facility] CATH LAB;  Service: Cardiovascular;  Laterality: N/A;  . LUMBAR LAMINECTOMY/DECOMPRESSION MICRODISCECTOMY N/A 10/21/2017   Procedure: Lumbar Two-Three, Lumbar Four-Five Discectomy;  Surgeon: Ashok Pall, MD;  Location: [facility];  Service: Neurosurgery;  Laterality: N/A;  . PERCUTANEOUS CORONARY STENT INTERVENTION (PCI-S)  02/06/2012   Procedure: PERCUTANEOUS CORONARY STENT INTERVENTION (PCI-S);  Surgeon: [name], MD;  Location: [facility] CATH LAB;  Service: Cardiovascular;;  . septoplasty    . TONSILLECTOMY AND ADENOIDECTOMY    . TRIGGER FINGER RELEASE    . UPPER GASTROINTESTINAL ENDOSCOPY  2011   gastric polyps  .  VASECTOMY      There were no vitals filed for this visit.  Subjective Assessment - 12/29/17 1406    Subjective  Reports he felt good after last session. No new concerns.    Pertinent History  laminectomy L2/3, L4/5, spondylolisthesis, CAD (stent), possible stroke    Patient Stated Goals  To get guideposts for what he can and cannont do; to ball room dance    Currently in Pain?  No/denies                       [facility] Adult PT Treatment/Exercise - 12/29/17 0001      Exercises   Exercises  Lumbar      Lumbar Exercises:  Stretches   Passive Hamstring Stretch  Right;Left;1 rep;30 seconds    Passive Hamstring Stretch Limitations  supine strap    Piriformis Stretch  Right;Left;1 rep;30 seconds    Piriformis Stretch Limitations  KTOS    Figure 4 Stretch  2 reps;30 seconds;With overpressure   mild c/o soreness on R     Lumbar Exercises: Aerobic   Recumbent Bike  --    Nustep  Lvl 3, 6 min       Lumbar Exercises: Machines for Strengthening   Other Lumbar Machine Exercise  UE extension pulldown + core contraction 15x 10#    Other Lumbar Machine Exercise  BATCA lat pulldown 20# x 10    cues for scap retraction     Lumbar Exercises: Standing   Other Standing Lumbar Exercises  B pallof press with one green TB closed in door x 10 each side      Lumbar Exercises: Seated   Sit to Stand  10 reps;Limitations   c/o knee pain   Sit to Stand Limitations  2x10; 2 foam pads with bottom touch    Other Seated Lumbar Exercises  B row with green TB x15   cues to avoid shoulder hyperextension     Lumbar Exercises: Supine   Dead Bug  20 reps;Limitations    Dead Bug Limitations  orange pball on belly; cues for technnique    Bridge  10 reps;3 seconds    Bridge Limitations  2x10; red TB around knees; cues to avoid valsalva      Lumbar Exercises: Sidelying   Clam  Both;15 reps;Limitations    Clam Limitations  red TB around knees; heavy cues to avoid trunk rotation             PT Education - 12/29/17 1642    Education Details  advised to progress bridges with red TB around knees    Person(s) Educated  Patient    Methods  Explanation;Demonstration    Comprehension  Verbalized understanding;Returned demonstration       PT Short Term Goals - 12/18/17 1101      PT SHORT TERM GOAL #1   Title  Independent with initial HEP    Time  2    Period  Weeks    Status  On-going      PT SHORT TERM GOAL #2   Title  Patient to understand the importance of a neutral spine to protect his spondylosis and laminectomies.     Time  2    Period  Weeks    Status  On-going        PT Long Term Goals - 12/18/17 1101      PT LONG TERM GOAL #1   Title  Independent with advanced HEP/gym program as indicated  including flexibility    Time  6    Period  Weeks    Status  On-going      PT LONG TERM GOAL #2   Title  Pt able to demonstrate/verbalize correct body mechanics with ADLS to prevent further injury.     Time  6    Period  Weeks    Status  On-going      PT LONG TERM GOAL #3   Title  Patient able to perform rotational strengthening activities in the spine with good form to allow return to ballroom dancing.    Time  6    Period  Weeks    Status  On-going      PT LONG TERM GOAL #4   Title  Patient able to sleep without waking from back pain.    Time  6    Period  Weeks    Status  On-going            Plan - 12/29/17 1643    Clinical Impression Statement  Patient arrived to session with no new concerns. Able to perform STS touching bottom on 2 foam pads- c/o R knee pain but otherwise able to tolerate. Progressed supine bridges with red TB- advised patient progress HEP bridge accordingly. Worked on core and hip strengthening exercises with cues required to correct form. Patient with question about aquatic classes- advised patient to participate in classes as long as incision is fully healed and he is maintaining precautions given to him by MD. Patient reported understanding. Patient reporting 1/10 pain in LB at end of session but declined modalities. Patient progressing well per POC.     PT Treatment/Interventions  ADLs/Self Care Home Management;Electrical Stimulation;Moist Heat;Therapeutic exercise;Therapeutic activities;Neuromuscular re-education;Patient/family education;Manual techniques;Dry needling    Consulted and Agree with Plan of Care  Patient       Patient will benefit from skilled therapeutic intervention in order to improve the following deficits and impairments:  Pain, Postural dysfunction,  Decreased range of motion, Decreased strength, Impaired flexibility  Visit Diagnosis: Chronic midline low back pain, with sciatica presence unspecified  Abnormal posture     Problem List Patient Active Problem List   Diagnosis Date Noted  . HNP (herniated nucleus pulposus), lumbar 10/21/2017  . Viral upper respiratory tract infection with cough 07/06/2017  . Current use of beta blocker 08/09/2015  . Moderate persistent asthma without complication 78/29/5621  . Other allergic rhinitis 02/20/2015  . PCP NOTES >>> 01/11/2015  . Cerebellar infarct ([place]) 06/26/2014  . BPH (benign prostatic hyperplasia) 06/22/2014  . Annual physical exam 02/20/2014  . Amnesia 02/20/2014  . Hyperglycemia 02/05/2013  . Benign paroxysmal positional vertigo 11/19/2012  . CAD (coronary artery disease) 02/07/2012  . Gastroesophageal reflux disease without esophagitis 01/01/2009  . SKIN CANCER, HX OF 12/28/2007  . Hyperlipidemia 06/16/2007  . Essential hypertension 06/16/2007  . Asthma, chronic 06/16/2007  . LUMBOSACRAL RADICULOPATHY 03/01/2007  . NEPHROLITHIASIS, HX OF 05/14/2006    Janene Harvey, PT, DPT 12/29/17 4:47 PM   [facility] [address]  [place], [place], [address] Phone: [phone]   Fax:  [phone]  Name: George Alvarez MRN: [id number] Date of Birth: [birth year]

## 2018-01-01 ENCOUNTER — Encounter: Payer: Self-pay | Admitting: Physical Therapy

## 2018-01-01 ENCOUNTER — Ambulatory Visit: Payer: PPO | Admitting: Physical Therapy

## 2018-01-01 DIAGNOSIS — M545 Low back pain: Secondary | ICD-10-CM | POA: Diagnosis not present

## 2018-01-01 DIAGNOSIS — G8929 Other chronic pain: Secondary | ICD-10-CM

## 2018-01-01 DIAGNOSIS — R293 Abnormal posture: Secondary | ICD-10-CM

## 2018-01-01 NOTE — Therapy (Signed)
[facility] [address]  [place], [place], [address] Phone: [phone]   Fax:  [phone]  Physical Therapy Treatment  Patient Details  Name: George Alvarez MRN: [id number] Date of Birth: 1946/03/22 Referring Provider (PT): Ashok Pall MD   Encounter Date: 01/01/2018  PT End of Session - 01/01/18 1101    Visit Number  6    Number of Visits  12    Date for PT Re-Evaluation  01/26/18    PT Start Time  1017    PT Stop Time  1100    PT Time Calculation (min)  43 min    Activity Tolerance  Patient tolerated treatment well    Behavior During Therapy  [facility] for tasks assessed/performed       Past Medical History:  Diagnosis Date  . Anal fissure   . Arthritis   . Asthma, chronic 06/16/2007   Qualifier: Diagnosis of  By: Linna Darner MD, Gwyndolyn Saxon   Onset:as child Triggers (environmental, infectious, allergic): all, mainly environmental triggers Rescue inhaler TDD:UKGURK Maintenance medications/ response:Singulair,Qvar Smoking history:never Family history pulmonary disease: no    . Basal cell carcinoma of chest wall 2016   1.8 cm, treated with electrodessication and currettage  . Benign paroxysmal positional vertigo 11/19/2012   Diagnosed at [facility]  Physical therapy appointment pending   . BPH (benign prostatic hyperplasia)    With urinary obstruction  . Coronary artery disease    a. 02/2012 Cath/PCI: LM 10, LAD min irregs, LCX large, OM1 sm, 40 ost, OM2 95p (5.0x16 Veriflex & 4.5x12 Veriflex BMS'), RCA 30p, 60m, 30d, PDA/PLA min irregs, EF 65%  . Diverticulosis   . Erectile dysfunction due to arterial insufficiency   . Fundic gland polyps of stomach, benign 2010  . GERD (gastroesophageal reflux disease)   . History of elevated PSA   . History of kidney stones   . Hyperlipidemia   . Hypertension   . Nocturia   . Perennial allergic rhinitis   . Peyronie's disease   . Skin cancer    Basal and  squamous cell cancers, greater than 20  . Stroke ([place]) 2016  . Tubular adenoma of colon 2010    Past Surgical History:  Procedure Laterality Date  . COLONOSCOPY  2003, 2011   negative  . CORONARY ANGIOPLASTY WITH STENT PLACEMENT  02/06/2012   OM2  bare metal   . EPIDURAL BLOCK INJECTION  04-2015   Dr Brien Few  . epidural steroids  2007, 2009   X 2 @ cervical &, lumbar)  . HERNIA REPAIR    . INTRAVASCULAR ULTRASOUND  02/06/2012   Procedure: INTRAVASCULAR ULTRASOUND;  Surgeon: [name], MD;  Location: [facility] CATH LAB;  Service: Cardiovascular;;  . LEFT HEART CATHETERIZATION WITH CORONARY ANGIOGRAM N/A 02/06/2012   Procedure: LEFT HEART CATHETERIZATION WITH CORONARY ANGIOGRAM;  Surgeon: [name], MD;  Location: [facility] CATH LAB;  Service: Cardiovascular;  Laterality: N/A;  . LUMBAR LAMINECTOMY/DECOMPRESSION MICRODISCECTOMY N/A 10/21/2017   Procedure: Lumbar Two-Three, Lumbar Four-Five Discectomy;  Surgeon: Ashok Pall, MD;  Location: [place];  Service: Neurosurgery;  Laterality: N/A;  . PERCUTANEOUS CORONARY STENT INTERVENTION (PCI-S)  02/06/2012   Procedure: PERCUTANEOUS CORONARY STENT INTERVENTION (PCI-S);  Surgeon: [name], MD;  Location: [facility] CATH LAB;  Service: Cardiovascular;;  . septoplasty    . TONSILLECTOMY AND ADENOIDECTOMY    . TRIGGER FINGER RELEASE    . UPPER GASTROINTESTINAL ENDOSCOPY  2011   gastric polyps  .  VASECTOMY      There were no vitals filed for this visit.  Subjective Assessment - 01/01/18 1017    Subjective  Reports he felt good after last session- did feel like he got a workout.     Pertinent History  laminectomy L2/3, L4/5, spondylolisthesis, CAD (stent), possible stroke    Patient Stated Goals  To get guideposts for what he can and cannont do; to ball room dance    Currently in Pain?  No/denies                       [facility] Adult PT Treatment/Exercise - 01/01/18 0001      Exercises   Exercises  Lumbar       Lumbar Exercises: Stretches   Passive Hamstring Stretch  Right;Left;30 seconds;2 reps    Passive Hamstring Stretch Limitations  supine strap    Hip Flexor Stretch  Right;Left;2 reps;30 seconds    Hip Flexor Stretch Limitations  mod thomas with strap    Other Lumbar Stretch Exercise  SKTC 30" each LE      Lumbar Exercises: Aerobic   Nustep  Lvl 3, 6 min       Lumbar Exercises: Standing   Other Standing Lumbar Exercises  hip hinge with cane behind back x10   cues for bent knees and neutral spine     Lumbar Exercises: Supine   Bridge  10 reps;3 seconds    Bridge Limitations  2x10; red TB around knees    Other Supine Lumbar Exercises  windshield wipers 20x   cues to avoid painful range     Lumbar Exercises: Sidelying   Clam  Both;15 reps;Limitations    Clam Limitations  red TB around knees; heavy cues for form      Manual Therapy   Manual Therapy  Soft tissue mobilization;Myofascial release    Manual therapy comments  sidelying     Soft tissue mobilization  STM to R superior buttocks, piriformis     Myofascial Release  TPR to R superior buttocks and piriformis             PT Education - 01/01/18 1101    Education Details  update to HEP- advised not to push into pain    Person(s) Educated  Patient    Methods  Explanation;Demonstration;Tactile cues;Verbal cues;Handout    Comprehension  Verbalized understanding;Returned demonstration       PT Short Term Goals - 12/18/17 1101      PT SHORT TERM GOAL #1   Title  Independent with initial HEP    Time  2    Period  Weeks    Status  On-going      PT SHORT TERM GOAL #2   Title  Patient to understand the importance of a neutral spine to protect his spondylosis and laminectomies.    Time  2    Period  Weeks    Status  On-going        PT Long Term Goals - 12/18/17 1101      PT LONG TERM GOAL #1   Title  Independent with advanced HEP/gym program as indicated including flexibility    Time  6    Period  Weeks     Status  On-going      PT LONG TERM GOAL #2   Title  Pt able to demonstrate/verbalize correct body mechanics with ADLS to prevent further injury.     Time  6    Period  Weeks    Status  On-going      PT LONG TERM GOAL #3   Title  Patient able to perform rotational strengthening activities in the spine with good form to allow return to ballroom dancing.    Time  6    Period  Weeks    Status  On-going      PT LONG TERM GOAL #4   Title  Patient able to sleep without waking from back pain.    Time  6    Period  Weeks    Status  On-going            Plan - 01/01/18 1101    Clinical Impression Statement  Patient arrived to session with no new complaints. Patient unsure of how long he is to maintain spinal precautions- advised patient to contact his surgeon in order to get cleared for ballroom dancing. Patient with good tolerance of LE stretching and spinal mobility exercises today. Introduced hip flexor stretch- patient with report of significant stretch in hip flexor and quad. Added this exercise to HEP and advised not to push into pain. Patient reported understanding. Introduced hip hinge with cane behind back to maintain neutral spine- good performance with cues required to maintain neutral and slight knee bend. Ended session with STM to R buttock as patient noting tightness/soreness in this region. Multiple painful trigger pts noted in glute and piriformis with relief after manual therapy. No c/o pain at end of session.     PT Treatment/Interventions  ADLs/Self Care Home Management;Electrical Stimulation;Moist Heat;Therapeutic exercise;Therapeutic activities;Neuromuscular re-education;Patient/family education;Manual techniques;Dry needling    Consulted and Agree with Plan of Care  Patient       Patient will benefit from skilled therapeutic intervention in order to improve the following deficits and impairments:  Pain, Postural dysfunction, Decreased range of motion, Decreased strength,  Impaired flexibility  Visit Diagnosis: Chronic midline low back pain, with sciatica presence unspecified  Abnormal posture     Problem List Patient Active Problem List   Diagnosis Date Noted  . HNP (herniated nucleus pulposus), lumbar 10/21/2017  . Viral upper respiratory tract infection with cough 07/06/2017  . Current use of beta blocker 08/09/2015  . Moderate persistent asthma without complication 40/98/1191  . Other allergic rhinitis 02/20/2015  . PCP NOTES >>> 01/11/2015  . Cerebellar infarct ([place]) 06/26/2014  . BPH (benign prostatic hyperplasia) 06/22/2014  . Annual physical exam 02/20/2014  . Amnesia 02/20/2014  . Hyperglycemia 02/05/2013  . Benign paroxysmal positional vertigo 11/19/2012  . CAD (coronary artery disease) 02/07/2012  . Gastroesophageal reflux disease without esophagitis 01/01/2009  . SKIN CANCER, HX OF 12/28/2007  . Hyperlipidemia 06/16/2007  . Essential hypertension 06/16/2007  . Asthma, chronic 06/16/2007  . LUMBOSACRAL RADICULOPATHY 03/01/2007  . NEPHROLITHIASIS, HX OF 05/14/2006     Janene Harvey, PT, DPT 01/01/18 11:07 AM   [facility] [address]  [place], [place], [address] Phone: [phone]   Fax:  [phone]  Name: ARMEL RABBANI MRN: [id number] Date of Birth: [birth year]

## 2018-01-05 ENCOUNTER — Ambulatory Visit: Payer: PPO | Attending: Neurosurgery

## 2018-01-05 DIAGNOSIS — M542 Cervicalgia: Secondary | ICD-10-CM | POA: Diagnosis not present

## 2018-01-05 DIAGNOSIS — G8929 Other chronic pain: Secondary | ICD-10-CM | POA: Diagnosis not present

## 2018-01-05 DIAGNOSIS — M545 Low back pain: Secondary | ICD-10-CM | POA: Diagnosis not present

## 2018-01-05 DIAGNOSIS — R293 Abnormal posture: Secondary | ICD-10-CM | POA: Diagnosis not present

## 2018-01-05 NOTE — Therapy (Addendum)
[facility] [address]  [place], [place], [address] Phone: [phone]   Fax:  [phone]  Physical Therapy Treatment  Patient Details  Name: George Alvarez MRN: [id number] Date of Birth: 1945/08/14 Referring Provider (PT): Ashok Pall MD   Encounter Date: 01/05/2018  PT End of Session - 01/05/18 1118    Visit Number  7    Number of Visits  12    Date for PT Re-Evaluation  01/26/18    PT Start Time  1105    PT Stop Time  1150    PT Time Calculation (min)  45 min    Activity Tolerance  Patient tolerated treatment well    Behavior During Therapy  [facility] for tasks assessed/performed       Past Medical History:  Diagnosis Date  . Anal fissure   . Arthritis   . Asthma, chronic 06/16/2007   Qualifier: Diagnosis of  By: Linna Darner MD, Gwyndolyn Saxon   Onset:as child Triggers (environmental, infectious, allergic): all, mainly environmental triggers Rescue inhaler NLZ:JQBHAL Maintenance medications/ response:Singulair,Qvar Smoking history:never Family history pulmonary disease: no    . Basal cell carcinoma of chest wall 2016   1.8 cm, treated with electrodessication and currettage  . Benign paroxysmal positional vertigo 11/19/2012   Diagnosed at [facility], [facility]  Physical therapy appointment pending   . BPH (benign prostatic hyperplasia)    With urinary obstruction  . Coronary artery disease    a. 02/2012 Cath/PCI: LM 10, LAD min irregs, LCX large, OM1 sm, 40 ost, OM2 95p (5.0x16 Veriflex & 4.5x12 Veriflex BMS'), RCA 30p, 59m, 30d, PDA/PLA min irregs, EF 65%  . Diverticulosis   . Erectile dysfunction due to arterial insufficiency   . Fundic gland polyps of stomach, benign 2010  . GERD (gastroesophageal reflux disease)   . History of elevated PSA   . History of kidney stones   . Hyperlipidemia   . Hypertension   . Nocturia   . Perennial allergic rhinitis   . Peyronie's disease   . Skin cancer    Basal and  squamous cell cancers, greater than 20  . Stroke ([place]) 2016  . Tubular adenoma of colon 2010    Past Surgical History:  Procedure Laterality Date  . COLONOSCOPY  2003, 2011   negative  . CORONARY ANGIOPLASTY WITH STENT PLACEMENT  02/06/2012   OM2  bare metal   . EPIDURAL BLOCK INJECTION  04-2015   Dr Brien Few  . epidural steroids  2007, 2009   X 2 @ cervical &, lumbar)  . HERNIA REPAIR    . INTRAVASCULAR ULTRASOUND  02/06/2012   Procedure: INTRAVASCULAR ULTRASOUND;  Surgeon: [name], MD;  Location: [facility] CATH LAB;  Service: Cardiovascular;;  . LEFT HEART CATHETERIZATION WITH CORONARY ANGIOGRAM N/A 02/06/2012   Procedure: LEFT HEART CATHETERIZATION WITH CORONARY ANGIOGRAM;  Surgeon: [name], MD;  Location: [facility] CATH LAB;  Service: Cardiovascular;  Laterality: N/A;  . LUMBAR LAMINECTOMY/DECOMPRESSION MICRODISCECTOMY N/A 10/21/2017   Procedure: Lumbar Two-Three, Lumbar Four-Five Discectomy;  Surgeon: Ashok Pall, MD;  Location: [place];  Service: Neurosurgery;  Laterality: N/A;  . PERCUTANEOUS CORONARY STENT INTERVENTION (PCI-S)  02/06/2012   Procedure: PERCUTANEOUS CORONARY STENT INTERVENTION (PCI-S);  Surgeon: [name], MD;  Location: [facility] CATH LAB;  Service: Cardiovascular;;  . septoplasty    . TONSILLECTOMY AND ADENOIDECTOMY    . TRIGGER FINGER RELEASE    . UPPER GASTROINTESTINAL ENDOSCOPY  2011   gastric polyps  .  VASECTOMY      There were no vitals filed for this visit.  Subjective Assessment - 01/05/18 1111    Subjective  Pt. reporting he is using cane less now and got excellent relief from buttocks pain after last visit's manual therapy.      Pertinent History  laminectomy L2/3, L4/5, spondylolisthesis, CAD (stent), possible stroke    Patient Stated Goals  To get guideposts for what he can and cannont do; to ball room dance    Currently in Pain?  No/denies    Multiple Pain Sites  No                       [facility] Adult PT  Treatment/Exercise - 01/05/18 1121      Lumbar Exercises: Stretches   Single Knee to Chest Stretch  Right;Left;2 reps;20 seconds      Lumbar Exercises: Aerobic   Recumbent Bike  Lvl 3, 7 min      Lumbar Exercises: Machines for Strengthening   Other Lumbar Machine Exercise  B low row 20# x 15 reps     Other Lumbar Machine Exercise  BATCA lat pulldown 25# x 10    focusing on neutral spine as not to stress lumbar      Lumbar Exercises: Standing   Functional Squats  15 reps;5 seconds    Functional Squats Limitations  TRX to tolerance       Lumbar Exercises: Seated   Hip Flexion on Ball  Right;Left;10 reps    Hip Flexion on Ball Limitations  seated on green p-ball     Other Seated Lumbar Exercises  B pallof press with red TB seated on green p-ball x 10 reps each way       Lumbar Exercises: Supine   Dead Bug  10 reps;3 seconds   2 sets    Dead Bug Limitations  Alternating LE/alternating UE raise 2nd set    cues required for abdom. bracing    Isometric Hip Flexion  10 reps;5 seconds    Isometric Hip Flexion Limitations  single LE raise with UE push               PT Short Term Goals - 01/05/18 1134      PT SHORT TERM GOAL #1   Title  Independent with initial HEP    Time  2    Period  Weeks    Status  Achieved      PT SHORT TERM GOAL #2   Title  Patient to understand the importance of a neutral spine to protect his spondylosis and laminectomies.    Time  2    Period  Weeks    Status  Achieved        PT Long Term Goals - 12/18/17 1101      PT LONG TERM GOAL #1   Title  Independent with advanced HEP/gym program as indicated including flexibility    Time  6    Period  Weeks    Status  On-going      PT LONG TERM GOAL #2   Title  Pt able to demonstrate/verbalize correct body mechanics with ADLS to prevent further injury.     Time  6    Period  Weeks    Status  On-going      PT LONG TERM GOAL #3   Title  Patient able to perform rotational strengthening  activities in the spine with good form to allow return to ballroom  dancing.    Time  6    Period  Weeks    Status  On-going      PT LONG TERM GOAL #4   Title  Patient able to sleep without waking from back pain.    Time  6    Period  Weeks    Status  On-going            Plan - 01/05/18 1118    Clinical Impression Statement  Dan tolerated session well today with progression of lumbopelvic strengthening with addition of Dead Bug and hip flexion isometrics.  Does occasionally required cueing to slow pacing with therex to ensure proper muscular activation.  Notes he had excellent relief from manual therapy performed to buttocks last visit with near complete relief.  Will continue to progress toward goals.      PT Treatment/Interventions  ADLs/Self Care Home Management;Electrical Stimulation;Moist Heat;Therapeutic exercise;Therapeutic activities;Neuromuscular re-education;Patient/family education;Manual techniques;Dry needling    PT Home Exercise Plan  SKTC, DKTC, bridge, LTR    Consulted and Agree with Plan of Care  Patient       Patient will benefit from skilled therapeutic intervention in order to improve the following deficits and impairments:  Pain, Postural dysfunction, Decreased range of motion, Decreased strength, Impaired flexibility  Visit Diagnosis: Chronic Midline low back pain, with sciatica presense  Abnormal posture        Problem List Patient Active Problem List   Diagnosis Date Noted  . HNP (herniated nucleus pulposus), lumbar 10/21/2017  . Viral upper respiratory tract infection with cough 07/06/2017  . Current use of beta blocker 08/09/2015  . Moderate persistent asthma without complication 19/50/9326  . Other allergic rhinitis 02/20/2015  . PCP NOTES >>> 01/11/2015  . Cerebellar infarct ([place]) 06/26/2014  . BPH (benign prostatic hyperplasia) 06/22/2014  . Annual physical exam 02/20/2014  . Amnesia 02/20/2014  . Hyperglycemia 02/05/2013  . Benign  paroxysmal positional vertigo 11/19/2012  . CAD (coronary artery disease) 02/07/2012  . Gastroesophageal reflux disease without esophagitis 01/01/2009  . SKIN CANCER, HX OF 12/28/2007  . Hyperlipidemia 06/16/2007  . Essential hypertension 06/16/2007  . Asthma, chronic 06/16/2007  . LUMBOSACRAL RADICULOPATHY 03/01/2007  . NEPHROLITHIASIS, HX OF 05/14/2006   Bess Harvest, PTA 01/05/18 12:15 PM   [facility] [address]  [place], [place], [address] Phone: [phone]   Fax:  [phone]  Name: George Alvarez MRN: [id number] Date of Birth: 18-Feb-1946

## 2018-01-08 ENCOUNTER — Ambulatory Visit: Payer: PPO | Admitting: Physical Therapy

## 2018-01-08 ENCOUNTER — Encounter: Payer: Self-pay | Admitting: Physical Therapy

## 2018-01-08 DIAGNOSIS — M545 Low back pain: Principal | ICD-10-CM

## 2018-01-08 DIAGNOSIS — G8929 Other chronic pain: Secondary | ICD-10-CM

## 2018-01-08 DIAGNOSIS — R293 Abnormal posture: Secondary | ICD-10-CM

## 2018-01-08 NOTE — Therapy (Signed)
[facility] [address]  [place], [place], [address] Phone: [phone]   Fax:  [phone]  Physical Therapy Treatment  Patient Details  Name: George Alvarez MRN: [id number] Date of Birth: 20-Apr-1945 Referring Provider (PT): Ashok Pall MD   Encounter Date: 01/08/2018  PT End of Session - 01/08/18 1151    Visit Number  8    Number of Visits  12    Date for PT Re-Evaluation  01/26/18    PT Start Time  1103    PT Stop Time  1147    PT Time Calculation (min)  44 min    Activity Tolerance  Patient tolerated treatment well    Behavior During Therapy  [facility] for tasks assessed/performed       Past Medical History:  Diagnosis Date  . Anal fissure   . Arthritis   . Asthma, chronic 06/16/2007   Qualifier: Diagnosis of  By: Linna Darner MD, Gwyndolyn Saxon   Onset:as child Triggers (environmental, infectious, allergic): all, mainly environmental triggers Rescue inhaler IPJ:ASNKNL Maintenance medications/ response:Singulair,Qvar Smoking history:never Family history pulmonary disease: no    . Basal cell carcinoma of chest wall 2016   1.8 cm, treated with electrodessication and currettage  . Benign paroxysmal positional vertigo 11/19/2012   Diagnosed at [facility], [facility]  Physical therapy appointment pending   . BPH (benign prostatic hyperplasia)    With urinary obstruction  . Coronary artery disease    a. 02/2012 Cath/PCI: LM 10, LAD min irregs, LCX large, OM1 sm, 40 ost, OM2 95p (5.0x16 Veriflex & 4.5x12 Veriflex BMS'), RCA 30p, 37m, 30d, PDA/PLA min irregs, EF 65%  . Diverticulosis   . Erectile dysfunction due to arterial insufficiency   . Fundic gland polyps of stomach, benign 2010  . GERD (gastroesophageal reflux disease)   . History of elevated PSA   . History of kidney stones   . Hyperlipidemia   . Hypertension   . Nocturia   . Perennial allergic rhinitis   . Peyronie's disease   . Skin cancer    Basal and  squamous cell cancers, greater than 20  . Stroke ([place]) 2016  . Tubular adenoma of colon 2010    Past Surgical History:  Procedure Laterality Date  . COLONOSCOPY  2003, 2011   negative  . CORONARY ANGIOPLASTY WITH STENT PLACEMENT  02/06/2012   OM2  bare metal   . EPIDURAL BLOCK INJECTION  04-2015   Dr Brien Few  . epidural steroids  2007, 2009   X 2 @ cervical &, lumbar)  . HERNIA REPAIR    . INTRAVASCULAR ULTRASOUND  02/06/2012   Procedure: INTRAVASCULAR ULTRASOUND;  Surgeon: [name], MD;  Location[facility] CATH LAB;  Service: Cardiovascular;;  . LEFT HEART CATHETERIZATION WITH CORONARY ANGIOGRAM N/A 02/06/2012   Procedure: LEFT HEART CATHETERIZATION WITH CORONARY ANGIOGRAM;  Surgeon: [name], MD;  Location: [facility] CATH LAB;  Service: Cardiovascular;  Laterality: N/A;  . LUMBAR LAMINECTOMY/DECOMPRESSION MICRODISCECTOMY N/A 10/21/2017   Procedure: Lumbar Two-Three, Lumbar Four-Five Discectomy;  Surgeon: Ashok Pall, MD;  Location: [place];  Service: Neurosurgery;  Laterality: N/A;  . PERCUTANEOUS CORONARY STENT INTERVENTION (PCI-S)  02/06/2012   Procedure: PERCUTANEOUS CORONARY STENT INTERVENTION (PCI-S);  Surgeon: [name], MD;  Location: [facility] CATH LAB;  Service: Cardiovascular;;  . septoplasty    . TONSILLECTOMY AND ADENOIDECTOMY    . TRIGGER FINGER RELEASE    . UPPER GASTROINTESTINAL ENDOSCOPY  2011   gastric polyps  .  VASECTOMY      There were no vitals filed for this visit.  Subjective Assessment - 01/08/18 1103    Subjective  Reports he is more tight today in the LB. Thinks he may have slept wrong. Scheduled an appointment with his surgeon to follow up about his surgery and surgical precautions on 02/02/18.    Pertinent History  laminectomy L2/3, L4/5, spondylolisthesis, CAD (stent), possible stroke    Patient Stated Goals  To get guideposts for what he can and cannont do; to ball room dance    Currently in Pain?  Yes    Pain Score  --   0.5/10    Pain Location  Back    Pain Orientation  Right;Left;Lower    Pain Descriptors / Indicators  Tightness    Pain Type  Acute pain                       [facility] Adult PT Treatment/Exercise - 01/08/18 0001      Exercises   Exercises  Lumbar      Lumbar Exercises: Stretches   Passive Hamstring Stretch  Right;Left;30 seconds;1 rep    Passive Hamstring Stretch Limitations  supine strap    Hip Flexor Stretch  Right;Left;30 seconds;2 reps    Hip Flexor Stretch Limitations  mod thomas with strap    Figure 4 Stretch  30 seconds;With overpressure;1 rep    Other Lumbar Stretch Exercise  QL stretch in doorway   pt reporting painful stretch in LB- discontinued     Lumbar Exercises: Aerobic   Nustep  Lvl 4, 6 min       Lumbar Exercises: Standing   Other Standing Lumbar Exercises  anterior step up R & L with CGA on 8" step 10x   cues to contract core     Lumbar Exercises: Seated   Long Arc Quad on [place]  Right;Left;1 set;10 reps    LAQ on [employer] (lbs)  sitting on green pball    Hip Flexion on Ball  Right;Left;10 reps    Hip Flexion on Ball Limitations  seated on green p-ball     Other Seated Lumbar Exercises  sitting on pball pelvic tilts ant/pos x20    Other Seated Lumbar Exercises  isometric ab sets with beach ball in lap 10x10"      Lumbar Exercises: Supine   Dead Bug  3 seconds;20 reps    Dead Bug Limitations  alt UE/LE raise    Other Supine Lumbar Exercises  windshield wipers 20x             PT Education - 01/08/18 1150    Education Details  educated and wrote in instructions on isometric ab set with ball 10x10" into patient's HEP folder    Person(s) Educated  Patient    Methods  Explanation;Demonstration;Tactile cues;Verbal cues;Handout    Comprehension  Verbalized understanding;Returned demonstration       PT Short Term Goals - 01/05/18 1134      PT SHORT TERM GOAL #1   Title  Independent with initial HEP    Time  2    Period  Weeks    Status   Achieved      PT SHORT TERM GOAL #2   Title  Patient to understand the importance of a neutral spine to protect his spondylosis and laminectomies.    Time  2    Period  Weeks    Status  Achieved  PT Long Term Goals - 12/18/17 1101      PT LONG TERM GOAL #1   Title  Independent with advanced HEP/gym program as indicated including flexibility    Time  6    Period  Weeks    Status  On-going      PT LONG TERM GOAL #2   Title  Pt able to demonstrate/verbalize correct body mechanics with ADLS to prevent further injury.     Time  6    Period  Weeks    Status  On-going      PT LONG TERM GOAL #3   Title  Patient able to perform rotational strengthening activities in the spine with good form to allow return to ballroom dancing.    Time  6    Period  Weeks    Status  On-going      PT LONG TERM GOAL #4   Title  Patient able to sleep without waking from back pain.    Time  6    Period  Weeks    Status  On-going            Plan - 01/08/18 1151    Clinical Impression Statement  Patient arrived to session with report of increased tightness/stiffness in LB today secondary to possibly sleeping wrong. Began session working on spinal mobility and LE stretching activity- patient requiring cues to avoid pushing into pain with stretches. Patient with poor carryover of exercises performed at previous visits- required heavy instruction on deadbug today. Difficulty and imbalance with exercises sitting on pball today- CGA/min A required to maintain balance as well as cues to contract core and maintain rhythmic breathing pattern. Patient with report of decreased LB stiffness at end of session and no pain.     PT Treatment/Interventions  ADLs/Self Care Home Management;Electrical Stimulation;Moist Heat;Therapeutic exercise;Therapeutic activities;Neuromuscular re-education;Patient/family education;Manual techniques;Dry needling    Consulted and Agree with Plan of Care  Patient       Patient  will benefit from skilled therapeutic intervention in order to improve the following deficits and impairments:  Pain, Postural dysfunction, Decreased range of motion, Decreased strength, Impaired flexibility  Visit Diagnosis: Chronic midline low back pain, unspecified whether sciatica present  Abnormal posture     Problem List Patient Active Problem List   Diagnosis Date Noted  . HNP (herniated nucleus pulposus), lumbar 10/21/2017  . Viral upper respiratory tract infection with cough 07/06/2017  . Current use of beta blocker 08/09/2015  . Moderate persistent asthma without complication 65/99/3570  . Other allergic rhinitis 02/20/2015  . PCP NOTES >>> 01/11/2015  . Cerebellar infarct ([place]) 06/26/2014  . BPH (benign prostatic hyperplasia) 06/22/2014  . Annual physical exam 02/20/2014  . Amnesia 02/20/2014  . Hyperglycemia 02/05/2013  . Benign paroxysmal positional vertigo 11/19/2012  . CAD (coronary artery disease) 02/07/2012  . Gastroesophageal reflux disease without esophagitis 01/01/2009  . SKIN CANCER, HX OF 12/28/2007  . Hyperlipidemia 06/16/2007  . Essential hypertension 06/16/2007  . Asthma, chronic 06/16/2007  . LUMBOSACRAL RADICULOPATHY 03/01/2007  . NEPHROLITHIASIS, HX OF 05/14/2006    Janene Harvey, PT, DPT 01/08/18 11:57 AM   [facility] [address]  [place], [place], [address] Phone: [phone]   Fax:  [phone]  Name: George Alvarez MRN: [id number] Date of Birth: 22-Jan-1946

## 2018-01-10 ENCOUNTER — Other Ambulatory Visit: Payer: Self-pay | Admitting: Pediatrics

## 2018-01-17 ENCOUNTER — Other Ambulatory Visit: Payer: Self-pay | Admitting: Pediatrics

## 2018-01-19 ENCOUNTER — Ambulatory Visit: Payer: PPO

## 2018-01-19 ENCOUNTER — Other Ambulatory Visit: Payer: Self-pay | Admitting: Allergy

## 2018-01-19 DIAGNOSIS — M545 Low back pain, unspecified: Secondary | ICD-10-CM

## 2018-01-19 DIAGNOSIS — R293 Abnormal posture: Secondary | ICD-10-CM

## 2018-01-19 DIAGNOSIS — G8929 Other chronic pain: Secondary | ICD-10-CM

## 2018-01-19 MED ORDER — MONTELUKAST SODIUM 10 MG PO TABS: 10.0000 mg | ORAL_TABLET | Freq: Every day | ORAL | 0 refills | Status: DC

## 2018-01-19 NOTE — Therapy (Signed)
[facility] [address]  [place], [place], [address] Phone: [phone]   Fax:  [phone]  Physical Therapy Treatment  Patient Details  Name: George Alvarez MRN: [id number] Date of Birth: 09/28/1945 Referring Provider (PT): Ashok Pall MD   Encounter Date: 01/19/2018  PT End of Session - 01/19/18 1024    Visit Number  9    Number of Visits  12    Date for PT Re-Evaluation  01/26/18    PT Start Time  1019    PT Stop Time  1057    PT Time Calculation (min)  38 min    Activity Tolerance  Patient tolerated treatment well    Behavior During Therapy  [facility] for tasks assessed/performed       Past Medical History:  Diagnosis Date  . Anal fissure   . Arthritis   . Asthma, chronic 06/16/2007   Qualifier: Diagnosis of  By: Linna Darner MD, Gwyndolyn Saxon   Onset:as child Triggers (environmental, infectious, allergic): all, mainly environmental triggers Rescue inhaler YPP:JKDTOI Maintenance medications/ response:Singulair,Qvar Smoking history:never Family history pulmonary disease: no    . Basal cell carcinoma of chest wall 2016   1.8 cm, treated with electrodessication and currettage  . Benign paroxysmal positional vertigo 11/19/2012   Diagnosed at [facility]  Physical therapy appointment pending   . BPH (benign prostatic hyperplasia)    With urinary obstruction  . Coronary artery disease    a. 02/2012 Cath/PCI: LM 10, LAD min irregs, LCX large, OM1 sm, 40 ost, OM2 95p (5.0x16 Veriflex & 4.5x12 Veriflex BMS'), RCA 30p, 15m 30d, PDA/PLA min irregs, EF 65%  . Diverticulosis   . Erectile dysfunction due to arterial insufficiency   . Fundic gland polyps of stomach, benign 2010  . GERD (gastroesophageal reflux disease)   . History of elevated PSA   . History of kidney stones   . Hyperlipidemia   . Hypertension   . Nocturia   . Perennial allergic rhinitis   . Peyronie's disease   . Skin cancer    Basal and  squamous cell cancers, greater than 20  . [place]  . Tubular adenoma of colon 2010    Past Surgical History:  Procedure Laterality Date  . COLONOSCOPY  2003, 2011   negative  . CORONARY ANGIOPLASTY WITH STENT PLACEMENT  02/06/2012   OM2  bare metal   . EPIDURAL BLOCK INJECTION  04-2015   Dr BBrien Few . epidural steroids  2007, 2009   X 2 @ cervical &, lumbar)  . HERNIA REPAIR    . INTRAVASCULAR ULTRASOUND  02/06/2012   Procedure: INTRAVASCULAR ULTRASOUND;  Surgeon: CBurnell Blanks MD;  [facility];  Service: Cardiovascular;;  . LEFT HEART CATHETERIZATION WITH CORONARY ANGIOGRAM N/A 02/06/2012   Procedure: LEFT HEART CATHETERIZATION WITH CORONARY ANGIOGRAM;  Surgeon: CBurnell Blanks MD;  [facility];  Service: Cardiovascular;  Laterality: N/A;  . LUMBAR LAMINECTOMY/DECOMPRESSION MICRODISCECTOMY N/A 10/21/2017   Procedure: Lumbar Two-Three, Lumbar Four-Five Discectomy;  Surgeon: CAshok Pall MD;  [facility] [facility]  Service: Neurosurgery;  Laterality: N/A;  . PERCUTANEOUS CORONARY STENT INTERVENTION (PCI-S)  02/06/2012   Procedure: PERCUTANEOUS CORONARY STENT INTERVENTION (PCI-S);  Surgeon: CBurnell Blanks MD;  [facility];  Service: Cardiovascular;;  . septoplasty    . TONSILLECTOMY AND ADENOIDECTOMY    . TRIGGER FINGER RELEASE    . UPPER GASTROINTESTINAL ENDOSCOPY  2011   gastric polyps  .  VASECTOMY      There were no vitals filed for this visit.  Subjective Assessment - 01/19/18 1023    Subjective  Pt. noting ~ 90% improvement in overall pain levels since starting therapy.      Pertinent History  laminectomy L2/3, L4/5, spondylolisthesis, CAD (stent), possible stroke    Patient Stated Goals  To get guideposts for what he can and cannont do; to ball room dance    Currently in Pain?  No/denies    Pain Score  0-No pain    Pain Location  Back    Pain Orientation  Right;Left;Lower    Pain Descriptors / Indicators   Tightness    Pain Type  Acute pain    Multiple Pain Sites  No                       [facility] Adult PT Treatment/Exercise - 01/19/18 1036      Lumbar Exercises: Stretches   Single Knee to Chest Stretch  Right;Left;2 reps;30 seconds    Single Knee to Chest Stretch Limitations  B     Double Knee to Chest Stretch  3 reps;20 seconds    Double Knee to Chest Stretch Limitations  rolling orange pball 3-way     Piriformis Stretch  Right;Left;1 rep;30 seconds    Piriformis Stretch Limitations  KTOS       Lumbar Exercises: Aerobic   Tread Mill  Treadmill: lvl 2.0, 6 min       Lumbar Exercises: Machines for Strengthening   Other Lumbar Machine Exercise  B machine pallof press with 5# x 10 reps     Other Lumbar Machine Exercise  BATCA lat pulldown 25# x 15       Lumbar Exercises: Supine   Bent Knee Raise  15 reps;3 seconds    Bent Knee Raise Limitations  with red TB at knees     Dead Bug  3 seconds;20 reps   only min cueing required for proper technique    Dead Bug Limitations  alt UE/LE raise      Lumbar Exercises: Sidelying   Clam  Both;15 reps;Limitations    Clam Limitations  red band at knees              PT Education - 01/19/18 1107    Education Details  HEP update     Person(s) Educated  Patient    Methods  Explanation;Demonstration;Verbal cues;Handout    Comprehension  Verbalized understanding;Returned demonstration;Verbal cues required;Need further instruction       PT Short Term Goals - 01/05/18 1134      PT SHORT TERM GOAL #1   Title  Independent with initial HEP    Time  2    Period  Weeks    Status  Achieved      PT SHORT TERM GOAL #2   Title  Patient to understand the importance of a neutral spine to protect his spondylosis and laminectomies.    Time  2    Period  Weeks    Status  Achieved        PT Long Term Goals - 01/19/18 1138      PT LONG TERM GOAL #1   Title  Independent with advanced HEP/gym program as indicated including  flexibility    Time  6    Period  Weeks    Status  Partially Met      PT LONG TERM GOAL #2   Title  Pt  able to demonstrate/verbalize correct body mechanics with ADLS to prevent further injury.     Time  6    Period  Weeks    Status  Achieved      PT LONG TERM GOAL #3   Title  Patient able to perform rotational strengthening activities in the spine with good form to allow return to ballroom dancing.    Time  6    Period  Weeks    Status  On-going      PT LONG TERM GOAL #4   Title  Patient able to sleep without waking from back pain.    Time  6    Period  Weeks    Status  Achieved            Plan - 01/19/18 1025    Clinical Impression Statement  Pt. has made good progress with therapy.  Noting he no longer wakes with back pain and verbalized he is more aware of proper sitting/standing posture.  Now feels he is using improved body mechanics when picking up objects from floor.  Primary concern is remaining pain with bending however feels ~ 90% improvement in overall pain since starting therapy.  Remaining pain with bending is reported in B superior buttocks area which seems muscular in nature and responds well to glute stretching in session.  Today's session focused on review of proper technique with lumbopelvic strengthening activities and progression of Pallof Press strengthening with machine.  Pt. progressing well toward goals.      PT Treatment/Interventions  ADLs/Self Care Home Management;Electrical Stimulation;Moist Heat;Therapeutic exercise;Therapeutic activities;Neuromuscular re-education;Patient/family education;Manual techniques;Dry needling    Consulted and Agree with Plan of Care  Patient       Patient will benefit from skilled therapeutic intervention in order to improve the following deficits and impairments:  Pain, Postural dysfunction, Decreased range of motion, Decreased strength, Impaired flexibility  Visit Diagnosis: Chronic midline low back pain, unspecified  whether sciatica present  Abnormal posture     Problem List Patient Active Problem List   Diagnosis Date Noted  . HNP (herniated nucleus pulposus), lumbar 10/21/2017  . Viral upper respiratory tract infection with cough 07/06/2017  . Current use of beta blocker 08/09/2015  . Moderate persistent asthma without complication 83/35/8251  . Other allergic rhinitis 02/20/2015  . PCP NOTES >>> 01/11/2015  . Cerebellar infarct ([place]) 06/26/2014  . BPH (benign prostatic hyperplasia) 06/22/2014  . Annual physical exam 02/20/2014  . Amnesia 02/20/2014  . Hyperglycemia 02/05/2013  . Benign paroxysmal positional vertigo 11/19/2012  . CAD (coronary artery disease) 02/07/2012  . Gastroesophageal reflux disease without esophagitis 01/01/2009  . SKIN CANCER, HX OF 12/28/2007  . Hyperlipidemia 06/16/2007  . Essential hypertension 06/16/2007  . Asthma, chronic 06/16/2007  . LUMBOSACRAL RADICULOPATHY 03/01/2007  . NEPHROLITHIASIS, HX OF 05/14/2006    Bess Harvest, PTA 01/19/18 11:44 AM   [facility] [address]  [place], [place], [address] Phone: [phone]   Fax:  [phone]  Name: George Alvarez MRN: [id number] Date of Birth: 1946-01-12

## 2018-01-22 ENCOUNTER — Encounter: Payer: PPO | Admitting: Physical Therapy

## 2018-01-25 ENCOUNTER — Encounter: Payer: Self-pay | Admitting: Pediatrics

## 2018-01-25 ENCOUNTER — Ambulatory Visit (INDEPENDENT_AMBULATORY_CARE_PROVIDER_SITE_OTHER): Payer: PPO | Admitting: Pediatrics

## 2018-01-25 VITALS — BP 130/78 | HR 72 | Temp 98.1°F | Resp 16 | Ht 69.1 in | Wt 220.6 lb

## 2018-01-25 DIAGNOSIS — Z79899 Other long term (current) drug therapy: Secondary | ICD-10-CM

## 2018-01-25 DIAGNOSIS — J4540 Moderate persistent asthma, uncomplicated: Secondary | ICD-10-CM | POA: Diagnosis not present

## 2018-01-25 DIAGNOSIS — I10 Essential (primary) hypertension: Secondary | ICD-10-CM

## 2018-01-25 DIAGNOSIS — J3089 Other allergic rhinitis: Secondary | ICD-10-CM | POA: Diagnosis not present

## 2018-01-25 DIAGNOSIS — K219 Gastro-esophageal reflux disease without esophagitis: Secondary | ICD-10-CM | POA: Diagnosis not present

## 2018-01-25 NOTE — Progress Notes (Signed)
  [address] [place] [address] Dept: [phone]  FOLLOW UP NOTE  Patient ID: George Alvarez, male    DOB: 1945-06-12  Age: 72 y.o. MRN: [id number] Date of Office Visit: 01/25/2018  Assessment  Chief Complaint: Asthma (doing well.  needs refills.) and Allergic Rhinitis   HPI [name] presents for follow-up of asthma and allergic rhinitis.  His asthma has been well controlled.  The last time he needed prednisone was for 5 days in April of this year.  He is on Flovent 110-2 puffs once a day and montelukast 10 mg once a day.  For his nasal symptoms he is using fluticasone 1 spray per nostril twice a day if needed  Other current medications are outlined in the chart   Drug Allergies:  Allergies  Allergen Reactions  . Aspirin Swelling    Angioedema in 1980s Medi Alert bracelet recommended  . Nifedipine Dermatitis and Rash    edema    Physical Exam: BP 130/78 (BP Location: Right Arm, Patient Position: Sitting, Cuff Size: Normal)   Pulse 72   Temp 98.1 F (36.7 C) (Oral)   Resp 16   Ht 5' 9.1" (1.755 m)   Wt 220 lb 9.6 oz (100.1 kg)   SpO2 95%   BMI 32.48 kg/m    Physical Exam  Constitutional: He is oriented to person, place, and time. He appears well-developed and well-nourished.  HENT:  Eyes normal.  Ears normal.  Nose normal.  Pharynx normal.  Neck: Neck supple.  Cardiovascular:  S1-S2 normal no murmur  Pulmonary/Chest:  Clear to percussion and auscultation  Lymphadenopathy:    He has no cervical adenopathy.  Neurological: He is alert and oriented to person, place, and time.  Psychiatric: He has a normal mood and affect. His behavior is normal. Judgment and thought content normal.  Vitals reviewed.   Diagnostics: FVC 4.28 L FEV1 3.15 L.  Predicted FVC 2.91 L predicted FEV1 2.30 L-this shows a mild reduction in the forced vital capacity but stable for him  Assessment and Plan: 1. Moderate persistent asthma without complication   2. Other  allergic rhinitis   3. Essential hypertension   4. Gastroesophageal reflux disease without esophagitis   5. Current use of beta blocker        Patient Instructions  Chlorpheniramine 4 mg once or twice a day if needed for runny nose Fluticasone 1 spray per nostril twice a day if needed for stuffy nose Flovent 110-2 puffs once a day to prevent coughing or wheezing but increase it to 2 puffs twice a day if the asthma is not well controlled Ventolin 2 puffs every 4 hours if needed for wheezing or coughing spells Montelukast 10 mg once a day to prevent coughing or wheezing Afrin nasal spray 1 hour before you fly Continue on your other medications Call me if you are not doing well on this treatment plan   Return in about 6 months (around 07/27/2018).    Thank you for the opportunity to care for this patient.  Please do not hesitate to contact me with questions.  Penne Lash, M.D.  [facility] [address] [place], [place] [address] [phone]

## 2018-01-25 NOTE — Patient Instructions (Signed)
Chlorpheniramine 4 mg once or twice a day if needed for runny nose Fluticasone 1 spray per nostril twice a day if needed for stuffy nose Flovent 110-2 puffs once a day to prevent coughing or wheezing but increase it to 2 puffs twice a day if the asthma is not well controlled Ventolin 2 puffs every 4 hours if needed for wheezing or coughing spells Montelukast 10 mg once a day to prevent coughing or wheezing Afrin nasal spray 1 hour before you fly Continue on your other medications Call me if you are not doing well on this treatment plan

## 2018-01-26 ENCOUNTER — Encounter: Payer: Self-pay | Admitting: Physical Therapy

## 2018-01-26 ENCOUNTER — Ambulatory Visit: Payer: PPO | Admitting: Physical Therapy

## 2018-01-26 DIAGNOSIS — M545 Low back pain, unspecified: Secondary | ICD-10-CM

## 2018-01-26 DIAGNOSIS — R293 Abnormal posture: Secondary | ICD-10-CM

## 2018-01-26 DIAGNOSIS — G8929 Other chronic pain: Secondary | ICD-10-CM

## 2018-01-26 NOTE — Therapy (Signed)
[facility] [address]  [place], [place], [address] Phone: [phone]   Fax:  [phone]  Physical Therapy Progress Note  Patient Details  Name: George Alvarez MRN: [id number] Date of Birth: 1945-04-23 Referring Provider (PT): Ashok Pall MD  Progress Note Reporting Period 12/15/17 to 01/26/18  See note below for Objective Data and Assessment of Progress/Goals.    Encounter Date: 01/26/2018  PT End of Session - 01/26/18 1155    Visit Number  10    Number of Visits  14    Date for PT Re-Evaluation  02/23/18    PT Start Time  1017    PT Stop Time  1058    PT Time Calculation (min)  41 min    Activity Tolerance  Patient tolerated treatment well    Behavior During Therapy  [facility] for tasks assessed/performed       Past Medical History:  Diagnosis Date  . Anal fissure   . Arthritis   . Asthma, chronic 06/16/2007   Qualifier: Diagnosis of  By: Linna Darner MD, Gwyndolyn Saxon   Onset:as child Triggers (environmental, infectious, allergic): all, mainly environmental triggers Rescue inhaler XQJ:JHERDE Maintenance medications/ response:Singulair,Qvar Smoking history:never Family history pulmonary disease: no    . Basal cell carcinoma of chest wall 2016   1.8 cm, treated with electrodessication and currettage  . Benign paroxysmal positional vertigo 11/19/2012   Diagnosed at [facility]  Physical therapy appointment pending   . BPH (benign prostatic hyperplasia)    With urinary obstruction  . Coronary artery disease    a. 02/2012 Cath/PCI: LM 10, LAD min irregs, LCX large, OM1 sm, 40 ost, OM2 95p (5.0x16 Veriflex & 4.5x12 Veriflex BMS'), RCA 30p, 52m 30d, PDA/PLA min irregs, EF 65%  . Diverticulosis   . Erectile dysfunction due to arterial insufficiency   . Fundic gland polyps of stomach, benign 2010  . GERD (gastroesophageal reflux disease)   . History of elevated PSA   . History of kidney stones   .  Hyperlipidemia   . Hypertension   . Nocturia   . Perennial allergic rhinitis   . Peyronie's disease   . Skin cancer    Basal and squamous cell cancers, greater than 20  . Stroke ([place] 2016  . Tubular adenoma of colon 2010    Past Surgical History:  Procedure Laterality Date  . COLONOSCOPY  2003, 2011   negative  . CORONARY ANGIOPLASTY WITH STENT PLACEMENT  02/06/2012   OM2  bare metal   . EPIDURAL BLOCK INJECTION  04-2015   Dr BBrien Few . epidural steroids  2007, 2009   X 2 @ cervical &, lumbar)  . HERNIA REPAIR    . INTRAVASCULAR ULTRASOUND  02/06/2012   Procedure: INTRAVASCULAR ULTRASOUND;  Surgeon: CBurnell Blanks MD;  [facility];  Service: Cardiovascular;;  . LEFT HEART CATHETERIZATION WITH CORONARY ANGIOGRAM N/A 02/06/2012   Procedure: LEFT HEART CATHETERIZATION WITH CORONARY ANGIOGRAM;  Surgeon: CBurnell Blanks MD;  [facility];  Service: Cardiovascular;  Laterality: N/A;  . LUMBAR LAMINECTOMY/DECOMPRESSION MICRODISCECTOMY N/A 10/21/2017   Procedure: Lumbar Two-Three, Lumbar Four-Five Discectomy;  Surgeon: CAshok Pall MD;  [facility]  Service: Neurosurgery;  Laterality: N/A;  . PERCUTANEOUS CORONARY STENT INTERVENTION (PCI-S)  02/06/2012   Procedure: PERCUTANEOUS CORONARY STENT INTERVENTION (PCI-S);  Surgeon: CBurnell Blanks MD;  [facility];  Service: Cardiovascular;;  . septoplasty    . TONSILLECTOMY AND  ADENOIDECTOMY    . TRIGGER FINGER RELEASE    . UPPER GASTROINTESTINAL ENDOSCOPY  2011   gastric polyps  . VASECTOMY      There were no vitals filed for this visit.  Subjective Assessment - 01/26/18 1018    Subjective  Reports he wants to retract a statement he made last time- he does have pain in the AM when he wakes up. Then it gets better. Reports 90% improvement since initial eval- able to perform HEP with less pain and soreness, better able to bend.     Pertinent History  laminectomy L2/3, L4/5,  spondylolisthesis, CAD (stent), possible stroke    Patient Stated Goals  To get guideposts for what he can and cannont do; to ball room dance    Currently in Pain?  Yes    Pain Score  2     Pain Location  Buttocks    Pain Orientation  Right    Pain Descriptors / Indicators  Discomfort    Pain Type  Acute pain         [facility] PT Assessment - 01/26/18 0001      Assessment   Medical Diagnosis  spinal stenosis s/p laminectomy    Referring Provider (PT)  Ashok Pall MD    Onset Date/Surgical Date  10/21/17      Observation/Other Assessments   Focus on Therapeutic Outcomes (FOTO)   Lumbar spine: 63 (37% limited, 37% predicted)                   [facility] Adult PT Treatment/Exercise - 01/26/18 0001      Exercises   Exercises  Lumbar;Knee/Hip      Lumbar Exercises: Stretches   Single Knee to Chest Stretch  Right;Left;30 seconds;1 rep    Hip Flexor Stretch  Right;Left;30 seconds;1 rep    Hip Flexor Stretch Limitations  mod thomas with strap      Lumbar Exercises: Aerobic   Nustep  Lvl 3, 6 min       Lumbar Exercises: Machines for Strengthening   Other Lumbar Machine Exercise  BATCA lat pulldown 10x 20#; 10x 30#    cues for form     Lumbar Exercises: Standing   Other Standing Lumbar Exercises  standing resisted PNF D2 flexion with rotation with red TB x10     Other Standing Lumbar Exercises  standing resisted trunk rotation with green TB 10x each side   cues for core contraction and control of movement     Lumbar Exercises: Seated   Other Seated Lumbar Exercises  BATCA row narrow grip 2x10 25#   cues for scap retraction     Lumbar Exercises: Supine   Other Supine Lumbar Exercises  windshield wipers 20x to tolerance      Knee/Hip Exercises: Standing   Functional Squat  1 set;10 reps;Limitations    Functional Squat Limitations  counter top squat to tolerance to avoid knee pain               PT Short Term Goals - 01/26/18 1029      PT SHORT TERM GOAL #1    Title  Independent with initial HEP    Time  2    Period  Weeks    Status  Achieved      PT SHORT TERM GOAL #2   Title  Patient to understand the importance of a neutral spine to protect his spondylosis and laminectomies.    Time  2    Period  Weeks    Status  Achieved        PT Long Term Goals - 01/26/18 1029      PT LONG TERM GOAL #1   Title  Independent with advanced HEP/gym program as indicated including flexibility    Time  4    Period  Weeks    Status  Partially Met   met for current, however intermittent   Target Date  02/23/18      PT LONG TERM GOAL #2   Title  Pt able to demonstrate/verbalize correct body mechanics with ADLS to prevent further injury.     Time  6    Period  Weeks    Status  Achieved      PT LONG TERM GOAL #3   Title  Patient able to perform rotational strengthening activities in the spine with good form to allow return to ballroom dancing.    Time  4    Period  Weeks    Status  On-going   initiated today   Target Date  02/23/18      PT LONG TERM GOAL #4   Title  Patient able to sleep without waking from back pain.    Time  6    Period  Weeks    Status  Achieved            Plan - 01/26/18 1156    Clinical Impression Statement  Patient arrived to session with repot of 90% improvement since initial eval- notes improvements in ability to bend spine and perform HEP. Patient has met or partially met all goals at this time- patient has limited his bending, lifting, twisting activities d/t surgical precautions from MD and has not returned to ballroom dancing which is his ultimate goal. Scheduled for MD follow-up on 02/02/18 to get clearance for these activities. Patient now able to sleep without pain and is knowledgeable about safe lifting mechanics and surgical precautions. Patient tolerated standing resisted trunk rotation exercises today to simulate ballroom dancing with report of muscle soreness in LB but no pain.  Soreness dissipated with  gentle lumbar ROM and LE stretching. Patient still with lightheadedness upon sitting up from supine which dissipated after sitting break. Good tolerance for machine strengthening with focus on core contraction/bracing throughout. Ended session with squats with mild c/o knee pain but tolerable and with good form. Report of decreased pain at end of session compared to beginning of session. Patient progressing well with PT thus far, would benefit from additional 1x/week for 4 weeks to address rotational activities to return to ballroom dancing. Patient in agreement.     PT Frequency  1x / week    PT Duration  4 weeks    PT Treatment/Interventions  ADLs/Self Care Home Management;Electrical Stimulation;Moist Heat;Therapeutic exercise;Therapeutic activities;Neuromuscular re-education;Patient/family education;Manual techniques;Dry needling    Consulted and Agree with Plan of Care  Patient       Patient will benefit from skilled therapeutic intervention in order to improve the following deficits and impairments:  Pain, Postural dysfunction, Decreased range of motion, Decreased strength, Impaired flexibility  Visit Diagnosis: Chronic midline low back pain, unspecified whether sciatica present  Abnormal posture     Problem List Patient Active Problem List   Diagnosis Date Noted  . HNP (herniated nucleus pulposus), lumbar 10/21/2017  . Viral upper respiratory tract infection with cough 07/06/2017  . Current use of beta blocker 08/09/2015  . Moderate persistent asthma without complication 39/06/90  . Other allergic rhinitis 02/20/2015  .  PCP NOTES >>> 01/11/2015  . Cerebellar infarct ([place]) 06/26/2014  . BPH (benign prostatic hyperplasia) 06/22/2014  . Annual physical exam 02/20/2014  . Amnesia 02/20/2014  . Hyperglycemia 02/05/2013  . Benign paroxysmal positional vertigo 11/19/2012  . CAD (coronary artery disease) 02/07/2012  . Gastroesophageal reflux disease without esophagitis 01/01/2009   . SKIN CANCER, HX OF 12/28/2007  . Hyperlipidemia 06/16/2007  . Essential hypertension 06/16/2007  . Asthma, chronic 06/16/2007  . LUMBOSACRAL RADICULOPATHY 03/01/2007  . NEPHROLITHIASIS, HX OF 05/14/2006    Janene Harvey, PT, DPT 01/26/18 12:04 PM   [facility] [address]  [address] [place], [place], [address] Phone: [phone]   Fax:  [phone]  Name: George Alvarez MRN: [id number] Date of Birth: 09-21-1945

## 2018-02-05 ENCOUNTER — Encounter: Payer: Self-pay | Admitting: Physical Therapy

## 2018-02-05 ENCOUNTER — Ambulatory Visit: Payer: PPO | Attending: Neurosurgery | Admitting: Physical Therapy

## 2018-02-05 DIAGNOSIS — M545 Low back pain, unspecified: Secondary | ICD-10-CM

## 2018-02-05 DIAGNOSIS — G8929 Other chronic pain: Secondary | ICD-10-CM | POA: Insufficient documentation

## 2018-02-05 DIAGNOSIS — R293 Abnormal posture: Secondary | ICD-10-CM | POA: Insufficient documentation

## 2018-02-05 NOTE — Therapy (Signed)
[facility] [address]  [place], [place], [address] Phone: [phone]   Fax:  [phone]  Physical Therapy Treatment  Patient Details  Name: George Alvarez MRN: [id number] Date of Birth: Mar 07, 1946 Referring Provider (PT): Ashok Pall MD   Encounter Date: 02/05/2018  PT End of Session - 02/05/18 1102    Visit Number  11    Number of Visits  14    Date for PT Re-Evaluation  02/23/18    PT Start Time  1448    PT Stop Time  1100    PT Time Calculation (min)  45 min    Activity Tolerance  Patient tolerated treatment well    Behavior During Therapy  [facility] for tasks assessed/performed       Past Medical History:  Diagnosis Date  . Anal fissure   . Arthritis   . Asthma, chronic 06/16/2007   Qualifier: Diagnosis of  By: Linna Darner MD, Gwyndolyn Saxon   Onset:as child Triggers (environmental, infectious, allergic): all, mainly environmental triggers Rescue inhaler JEH:UDJSHF Maintenance medications/ response:Singulair,Qvar Smoking history:never Family history pulmonary disease: no    . Basal cell carcinoma of chest wall 2016   1.8 cm, treated with electrodessication and currettage  . Benign paroxysmal positional vertigo 11/19/2012   Diagnosed at [facility]  Physical therapy appointment pending   . BPH (benign prostatic hyperplasia)    With urinary obstruction  . Coronary artery disease    a. 02/2012 Cath/PCI: LM 10, LAD min irregs, LCX large, OM1 sm, 40 ost, OM2 95p (5.0x16 Veriflex & 4.5x12 Veriflex BMS'), RCA 30p, 27m 30d, PDA/PLA min irregs, EF 65%  . Diverticulosis   . Erectile dysfunction due to arterial insufficiency   . Fundic gland polyps of stomach, benign 2010  . GERD (gastroesophageal reflux disease)   . History of elevated PSA   . History of kidney stones   . Hyperlipidemia   . Hypertension   . Nocturia   . Perennial allergic rhinitis   . Peyronie's disease   . Skin cancer    Basal and  squamous cell cancers, greater than 20  . [place]  . Tubular adenoma of colon 2010    Past Surgical History:  Procedure Laterality Date  . COLONOSCOPY  2003, 2011   negative  . CORONARY ANGIOPLASTY WITH STENT PLACEMENT  02/06/2012   OM2  bare metal   . EPIDURAL BLOCK INJECTION  04-2015   Dr BBrien Few . epidural steroids  2007, 2009   X 2 @ cervical &, lumbar)  . HERNIA REPAIR    . INTRAVASCULAR ULTRASOUND  02/06/2012   Procedure: INTRAVASCULAR ULTRASOUND;  Surgeon: CBurnell Blanks MD;  [facility];  Service: Cardiovascular;;  . LEFT HEART CATHETERIZATION WITH CORONARY ANGIOGRAM N/A 02/06/2012   Procedure: LEFT HEART CATHETERIZATION WITH CORONARY ANGIOGRAM;  Surgeon: CBurnell Blanks MD;  [facility];  Service: Cardiovascular;  Laterality: N/A;  . LUMBAR LAMINECTOMY/DECOMPRESSION MICRODISCECTOMY N/A 10/21/2017   Procedure: Lumbar Two-Three, Lumbar Four-Five Discectomy;  Surgeon: CAshok Pall MD;  Location: [facility]  Service: Neurosurgery;  Laterality: N/A;  . PERCUTANEOUS CORONARY STENT INTERVENTION (PCI-S)  02/06/2012   Procedure: PERCUTANEOUS CORONARY STENT INTERVENTION (PCI-S);  Surgeon: CBurnell Blanks MD;  [facility];  Service: Cardiovascular;;  . septoplasty    . TONSILLECTOMY AND ADENOIDECTOMY    . TRIGGER FINGER RELEASE    . UPPER GASTROINTESTINAL ENDOSCOPY  2011   gastric polyps  .  VASECTOMY      There were no vitals filed for this visit.  Subjective Assessment - 02/05/18 1018    Subjective  Patient saw MD who told him "you can do anything you want. And whatever you did before you can do now." Advised him if something hurts, to stop. Reports he was sore in LB after last session but it went away.     Pertinent History  laminectomy L2/3, L4/5, spondylolisthesis, CAD (stent), possible stroke    Patient Stated Goals  To get guideposts for what he can and cannont do; to ball room dance    Currently in Pain?  No/denies                        [facility] Adult PT Treatment/Exercise - 02/05/18 0001      Exercises   Exercises  Lumbar;Knee/Hip      Lumbar Exercises: Machines for Strengthening   Other Lumbar Machine Exercise  B machine pallof press with 5# x 10 reps     Other Lumbar Machine Exercise  straight arm pulldown 10x 15#   cues for elbows straight     Lumbar Exercises: Standing   Other Standing Lumbar Exercises  standing resisted PNF D2 flexion with rotation with red TB x10    cues for slow eccentric return   Other Standing Lumbar Exercises  standing resisted trunk rotation with green TB 10x each side   heavy cues for core contraction and slow eccentric control     Knee/Hip Exercises: Standing   Other Standing Knee Exercises  hip hinge with SPC behind back x10   cues to maintain straight spine and mild knee bend   Other Standing Knee Exercises  deadlift with 5# in each hand x15   first 5 reps without weight; cues to avoid rounding back     Manual Therapy   Manual Therapy  Soft tissue mobilization;Myofascial release    Manual therapy comments  prone    Soft tissue mobilization  STM to R superior buttocks, piriformis     Myofascial Release  TPR to R superior buttocks and piriformis             PT Education - 02/05/18 1102    Education Details  update to HEP and UE gym routine; green band administered    Person(s) Educated  Patient    Methods  Explanation;Demonstration;Tactile cues;Verbal cues;Handout    Comprehension  Verbalized understanding;Returned demonstration       PT Short Term Goals - 01/26/18 1029      PT SHORT TERM GOAL #1   Title  Independent with initial HEP    Time  2    Period  Weeks    Status  Achieved      PT SHORT TERM GOAL #2   Title  Patient to understand the importance of a neutral spine to protect his spondylosis and laminectomies.    Time  2    Period  Weeks    Status  Achieved        PT Long Term Goals - 01/26/18 1029      PT LONG  TERM GOAL #1   Title  Independent with advanced HEP/gym program as indicated including flexibility    Time  4    Period  Weeks    Status  Partially Met   met for current, however intermittent   Target Date  02/23/18      PT LONG TERM GOAL #2  Title  Pt able to demonstrate/verbalize correct body mechanics with ADLS to prevent further injury.     Time  6    Period  Weeks    Status  Achieved      PT LONG TERM GOAL #3   Title  Patient able to perform rotational strengthening activities in the spine with good form to allow return to ballroom dancing.    Time  4    Period  Weeks    Status  On-going   initiated today   Target Date  02/23/18      PT LONG TERM GOAL #4   Title  Patient able to sleep without waking from back pain.    Time  6    Period  Weeks    Status  Achieved            Plan - 02/05/18 1207    Clinical Impression Statement  Patient arrived to session with report that he saw MD who advised him to return to daily hobbies within reason. Patient reporting some soreness in L buttock during standing resisted trunk rotation. Report of catching in this same area with diagonal resisted rotation. Addressed this pain with STM to L superior glute and piriformis with good relief. Introduced hip hinges with cane behind back with good form. Cues required to maintain neutral spine with deadlift, however good carryover of cues by end of activity. Reviewed machine strengthening with patient to improve comfort with return to gym. Updated HEP to include gym regimen and resisted spinal rotation exercises. Patient reported understanding and with no pain at end of session.    PT Treatment/Interventions  ADLs/Self Care Home Management;Electrical Stimulation;Moist Heat;Therapeutic exercise;Therapeutic activities;Neuromuscular re-education;Patient/family education;Manual techniques;Dry needling    Consulted and Agree with Plan of Care  Patient       Patient will benefit from skilled  therapeutic intervention in order to improve the following deficits and impairments:  Pain, Postural dysfunction, Decreased range of motion, Decreased strength, Impaired flexibility  Visit Diagnosis: Chronic midline low back pain, unspecified whether sciatica present  Abnormal posture     Problem List Patient Active Problem List   Diagnosis Date Noted  . HNP (herniated nucleus pulposus), lumbar 10/21/2017  . Viral upper respiratory tract infection with cough 07/06/2017  . Current use of beta blocker 08/09/2015  . Moderate persistent asthma without complication 16/01/9603  . Other allergic rhinitis 02/20/2015  . PCP NOTES >>> 01/11/2015  . Cerebellar infarct ([place]) 06/26/2014  . BPH (benign prostatic hyperplasia) 06/22/2014  . Annual physical exam 02/20/2014  . Amnesia 02/20/2014  . Hyperglycemia 02/05/2013  . Benign paroxysmal positional vertigo 11/19/2012  . CAD (coronary artery disease) 02/07/2012  . Gastroesophageal reflux disease without esophagitis 01/01/2009  . SKIN CANCER, HX OF 12/28/2007  . Hyperlipidemia 06/16/2007  . Essential hypertension 06/16/2007  . Asthma, chronic 06/16/2007  . LUMBOSACRAL RADICULOPATHY 03/01/2007  . NEPHROLITHIASIS, HX OF 05/14/2006    Janene Harvey, PT, DPT 02/05/18 12:13 PM   [facility] [address]  [place], [place], [address] Phone: [phone]   Fax:  [phone]  Name: George Alvarez MRN: [id number] Date of Birth: 1945/11/02

## 2018-02-09 ENCOUNTER — Encounter: Payer: Self-pay | Admitting: Physical Therapy

## 2018-02-09 ENCOUNTER — Ambulatory Visit: Payer: PPO | Admitting: Physical Therapy

## 2018-02-09 DIAGNOSIS — M545 Low back pain, unspecified: Secondary | ICD-10-CM

## 2018-02-09 DIAGNOSIS — R293 Abnormal posture: Secondary | ICD-10-CM

## 2018-02-09 DIAGNOSIS — G8929 Other chronic pain: Secondary | ICD-10-CM

## 2018-02-09 NOTE — Therapy (Signed)
[facility] [address]  [place], [place], [address] Phone: [phone]   Fax:  [phone]  Physical Therapy Treatment  Patient Details  Name: George Alvarez MRN: [id number] Date of Birth: 04/09/1945 Referring Provider (PT): Ashok Pall MD   Encounter Date: 02/09/2018  PT End of Session - 02/09/18 1358    Visit Number  12    Number of Visits  14    Date for PT Re-Evaluation  02/23/18    PT Start Time  1950    PT Stop Time  1357    PT Time Calculation (min)  42 min    Activity Tolerance  Patient tolerated treatment well    Behavior During Therapy  [facility] for tasks assessed/performed       Past Medical History:  Diagnosis Date  . Anal fissure   . Arthritis   . Asthma, chronic 06/16/2007   Qualifier: Diagnosis of  By: Linna Darner MD, Gwyndolyn Saxon   Onset:as child Triggers (environmental, infectious, allergic): all, mainly environmental triggers Rescue inhaler DTO:IZTIWP Maintenance medications/ response:Singulair,Qvar Smoking history:never Family history pulmonary disease: no    . Basal cell carcinoma of chest wall 2016   1.8 cm, treated with electrodessication and currettage  . Benign paroxysmal positional vertigo 11/19/2012   Diagnosed at [facility]  Physical therapy appointment pending   . BPH (benign prostatic hyperplasia)    With urinary obstruction  . Coronary artery disease    a. 02/2012 Cath/PCI: LM 10, LAD min irregs, LCX large, OM1 sm, 40 ost, OM2 95p (5.0x16 Veriflex & 4.5x12 Veriflex BMS'), RCA 30p, 20m 30d, PDA/PLA min irregs, EF 65%  . Diverticulosis   . Erectile dysfunction due to arterial insufficiency   . Fundic gland polyps of stomach, benign 2010  . GERD (gastroesophageal reflux disease)   . History of elevated PSA   . History of kidney stones   . Hyperlipidemia   . Hypertension   . Nocturia   . Perennial allergic rhinitis   . Peyronie's disease   . Skin cancer    Basal and  squamous cell cancers, greater than 20  . [place]  . Tubular adenoma of colon 2010    Past Surgical History:  Procedure Laterality Date  . COLONOSCOPY  2003, 2011   negative  . CORONARY ANGIOPLASTY WITH STENT PLACEMENT  02/06/2012   OM2  bare metal   . EPIDURAL BLOCK INJECTION  04-2015   Dr BBrien Few . epidural steroids  2007, 2009   X 2 @ cervical &, lumbar)  . HERNIA REPAIR    . INTRAVASCULAR ULTRASOUND  02/06/2012   Procedure: INTRAVASCULAR ULTRASOUND;  Surgeon: CBurnell Blanks MD;  [facility];  Service: Cardiovascular;;  . LEFT HEART CATHETERIZATION WITH CORONARY ANGIOGRAM N/A 02/06/2012   Procedure: LEFT HEART CATHETERIZATION WITH CORONARY ANGIOGRAM;  Surgeon: CBurnell Blanks MD;  [facility];  Service: Cardiovascular;  Laterality: N/A;  . LUMBAR LAMINECTOMY/DECOMPRESSION MICRODISCECTOMY N/A 10/21/2017   Procedure: Lumbar Two-Three, Lumbar Four-Five Discectomy;  Surgeon: CAshok Pall MD;  [place]  Service: Neurosurgery;  Laterality: N/A;  . PERCUTANEOUS CORONARY STENT INTERVENTION (PCI-S)  02/06/2012   Procedure: PERCUTANEOUS CORONARY STENT INTERVENTION (PCI-S);  Surgeon: CBurnell Blanks MD;  [facility];  Service: Cardiovascular;;  . septoplasty    . TONSILLECTOMY AND ADENOIDECTOMY    . TRIGGER FINGER RELEASE    . UPPER GASTROINTESTINAL ENDOSCOPY  2011   gastric polyps  .  VASECTOMY      There were no vitals filed for this visit.  Subjective Assessment - 02/09/18 1313    Subjective  Reports he was sore after last session. Took a long walk and had L sided buttock pain again which dissipated after sitting down. [facility] been [facility] to [facility] and probably over-did it.     Pertinent History  laminectomy L2/3, L4/5, spondylolisthesis, CAD (stent), possible stroke    Patient Stated Goals  To get guideposts for what he can and cannont do; to ball room dance    Currently in Pain?  No/denies                        [facility] Adult PT Treatment/Exercise - 02/09/18 0001      Exercises   Exercises  Lumbar;Knee/Hip      Lumbar Exercises: Stretches   Passive Hamstring Stretch  Right;Left;30 seconds;2 reps    Passive Hamstring Stretch Limitations  supine strap to tol    Hip Flexor Stretch  Right;Left;30 seconds;2 reps    Hip Flexor Stretch Limitations  mod thomas with strap to tol      Lumbar Exercises: Aerobic   Nustep  Lvl 4, 6 min       Lumbar Exercises: Seated   Other Seated Lumbar Exercises  sitting on pball resisted trunk rotation with red TB x15 each side   cues to maintain elbows straight     Lumbar Exercises: Quadruped   Single Arm Raise  Right;Left;20 reps;Limitations    Single Arm Raise Weights (lbs)  alt UE raise w/ cues for neutral spine    Opposite Arm/Leg Raise  10 reps;Right arm/Left leg;Left arm/Right leg;Limitations    Opposite Arm/Leg Raise Limitations  cues to avoid hip drop    Other Quadruped Lumbar Exercises  thread the needle x10 each side to tolerance   manual cues for neutral positioning     Knee/Hip Exercises: Standing   Wall Squat  10 reps;3 seconds;2 sets    Wall Squat Limitations  1st set red TB around knees; 2nd set ball between knees      Knee/Hip Exercises: Sidelying   Clams  x15 each side with red TB   good form            PT Education - 02/09/18 1358    Education Details  update, review, and consolidation of HEP    Person(s) Educated  Patient    Methods  Explanation;Demonstration;Tactile cues;Verbal cues;Handout    Comprehension  Verbalized understanding;Returned demonstration       PT Short Term Goals - 01/26/18 1029      PT SHORT TERM GOAL #1   Title  Independent with initial HEP    Time  2    Period  Weeks    Status  Achieved      PT SHORT TERM GOAL #2   Title  Patient to understand the importance of a neutral spine to protect his spondylosis and laminectomies.    Time  2    Period  Weeks    Status   Achieved        PT Long Term Goals - 01/26/18 1029      PT LONG TERM GOAL #1   Title  Independent with advanced HEP/gym program as indicated including flexibility    Time  4    Period  Weeks    Status  Partially Met   met for current, however intermittent   Target Date  02/23/18      PT LONG TERM GOAL #2   Title  Pt able to demonstrate/verbalize correct body mechanics with ADLS to prevent further injury.     Time  6    Period  Weeks    Status  Achieved      PT LONG TERM GOAL #3   Title  Patient able to perform rotational strengthening activities in the spine with good form to allow return to ballroom dancing.    Time  4    Period  Weeks    Status  On-going   initiated today   Target Date  02/23/18      PT LONG TERM GOAL #4   Title  Patient able to sleep without waking from back pain.    Time  6    Period  Weeks    Status  Achieved            Plan - 02/09/18 1359    Clinical Impression Statement  Patient reporting that he has return to [facility] since last session and believes he may have over-did it a bit. Had some L sided buttock pain during a long walk which dissipated after sitting down. Progressed core and lumbopelvic strengthening today with birddogs and spinal rotation in quadruped. Patient requiring intermittent cues to promote neutral spine and avoiding hip drop. Tried resisted trunk rotation sitting on physioball with good stability by patient- report of "I'd going to feel this tomorrow" however denied pain. Spent time to review and consolidate HEP and updated HEP with thread the needle exercise. Patient with no complaint of pain and repowered understanding of all edu given today. Patient progressing towards goals.     PT Treatment/Interventions  ADLs/Self Care Home Management;Electrical Stimulation;Moist Heat;Therapeutic exercise;Therapeutic activities;Neuromuscular re-education;Patient/family education;Manual techniques;Dry needling    Consulted and Agree with Plan  of Care  Patient       Patient will benefit from skilled therapeutic intervention in order to improve the following deficits and impairments:  Pain, Postural dysfunction, Decreased range of motion, Decreased strength, Impaired flexibility  Visit Diagnosis: Chronic midline low back pain, unspecified whether sciatica present  Abnormal posture     Problem List Patient Active Problem List   Diagnosis Date Noted  . HNP (herniated nucleus pulposus), lumbar 10/21/2017  . Viral upper respiratory tract infection with cough 07/06/2017  . Current use of beta blocker 08/09/2015  . Moderate persistent asthma without complication 90/93/1121  . Other allergic rhinitis 02/20/2015  . PCP NOTES >>> 01/11/2015  . Cerebellar infarct ([place]) 06/26/2014  . BPH (benign prostatic hyperplasia) 06/22/2014  . Annual physical exam 02/20/2014  . Amnesia 02/20/2014  . Hyperglycemia 02/05/2013  . Benign paroxysmal positional vertigo 11/19/2012  . CAD (coronary artery disease) 02/07/2012  . Gastroesophageal reflux disease without esophagitis 01/01/2009  . SKIN CANCER, HX OF 12/28/2007  . Hyperlipidemia 06/16/2007  . Essential hypertension 06/16/2007  . Asthma, chronic 06/16/2007  . LUMBOSACRAL RADICULOPATHY 03/01/2007  . NEPHROLITHIASIS, HX OF 05/14/2006    Janene Harvey, PT, DPT 02/09/18 2:02 PM   [facility] [address]  [place], [place], [address] Phone: [phone]   Fax:  [phone]  Name: George Alvarez MRN: [id number] Date of Birth: Mar 30, 1946

## 2018-02-11 ENCOUNTER — Other Ambulatory Visit: Payer: Self-pay | Admitting: Pediatrics

## 2018-02-16 ENCOUNTER — Encounter: Payer: Self-pay | Admitting: Physical Therapy

## 2018-02-16 ENCOUNTER — Ambulatory Visit: Payer: PPO | Admitting: Physical Therapy

## 2018-02-16 DIAGNOSIS — G8929 Other chronic pain: Secondary | ICD-10-CM

## 2018-02-16 DIAGNOSIS — R293 Abnormal posture: Secondary | ICD-10-CM

## 2018-02-16 DIAGNOSIS — M545 Low back pain, unspecified: Secondary | ICD-10-CM

## 2018-02-16 NOTE — Therapy (Signed)
[facility] [address]  [place], [place], [address] Phone: [phone]   Fax:  [phone]  Physical Therapy Treatment  Patient Details  Name: George Alvarez MRN: [id number] Date of Birth: April 18, 1945 Referring Provider (PT): Ashok Pall MD   Encounter Date: 02/16/2018  PT End of Session - 02/16/18 1059    Visit Number  13    Number of Visits  14    Date for PT Re-Evaluation  02/23/18    PT Start Time  0300    PT Stop Time  1057    PT Time Calculation (min)  43 min    Activity Tolerance  Patient tolerated treatment well    Behavior During Therapy  [facility] for tasks assessed/performed       Past Medical History:  Diagnosis Date  . Anal fissure   . Arthritis   . Asthma, chronic 06/16/2007   Qualifier: Diagnosis of  By: Linna Darner MD, Gwyndolyn Saxon   Onset:as child Triggers (environmental, infectious, allergic): all, mainly environmental triggers Rescue inhaler PQZ:RAQTMA Maintenance medications/ response:Singulair,Qvar Smoking history:never Family history pulmonary disease: no    . Basal cell carcinoma of chest wall 2016   1.8 cm, treated with electrodessication and currettage  . Benign paroxysmal positional vertigo 11/19/2012   Diagnosed at [facility], [facility]  Physical therapy appointment pending   . BPH (benign prostatic hyperplasia)    With urinary obstruction  . Coronary artery disease    a. 02/2012 Cath/PCI: LM 10, LAD min irregs, LCX large, OM1 sm, 40 ost, OM2 95p (5.0x16 Veriflex & 4.5x12 Veriflex BMS'), RCA 30p, 22m 30d, PDA/PLA min irregs, EF 65%  . Diverticulosis   . Erectile dysfunction due to arterial insufficiency   . Fundic gland polyps of stomach, benign 2010  . GERD (gastroesophageal reflux disease)   . History of elevated PSA   . History of kidney stones   . Hyperlipidemia   . Hypertension   . Nocturia   . Perennial allergic rhinitis   . Peyronie's disease   . Skin cancer    Basal and  squamous cell cancers, greater than 20  . Stroke ([place] 2016  . Tubular adenoma of colon 2010    Past Surgical History:  Procedure Laterality Date  . COLONOSCOPY  2003, 2011   negative  . CORONARY ANGIOPLASTY WITH STENT PLACEMENT  02/06/2012   OM2  bare metal   . EPIDURAL BLOCK INJECTION  04-2015   Dr BBrien Few . epidural steroids  2007, 2009   X 2 @ cervical &, lumbar)  . HERNIA REPAIR    . INTRAVASCULAR ULTRASOUND  02/06/2012   Procedure: INTRAVASCULAR ULTRASOUND;  Surgeon: CBurnell Blanks MD;  [facility];  Service: Cardiovascular;;  . LEFT HEART CATHETERIZATION WITH CORONARY ANGIOGRAM N/A 02/06/2012   Procedure: LEFT HEART CATHETERIZATION WITH CORONARY ANGIOGRAM;  Surgeon: CBurnell Blanks MD;  [facility];  Service: Cardiovascular;  Laterality: N/A;  . LUMBAR LAMINECTOMY/DECOMPRESSION MICRODISCECTOMY N/A 10/21/2017   Procedure: Lumbar Two-Three, Lumbar Four-Five Discectomy;  Surgeon: CAshok Pall MD;  [facility]  Service: Neurosurgery;  Laterality: N/A;  . PERCUTANEOUS CORONARY STENT INTERVENTION (PCI-S)  02/06/2012   Procedure: PERCUTANEOUS CORONARY STENT INTERVENTION (PCI-S);  Surgeon: CBurnell Blanks MD;  [facility];  Service: Cardiovascular;;  . septoplasty    . TONSILLECTOMY AND ADENOIDECTOMY    . TRIGGER FINGER RELEASE    . UPPER GASTROINTESTINAL ENDOSCOPY  2011   gastric polyps  .  VASECTOMY      There were no vitals filed for this visit.  Subjective Assessment - 02/16/18 1015    Subjective  Reports he went to 3 water aerobic classes, has been back to ballroom dances, did a lot of lifting his grandkids, and walked a lot. Reports he feels pretty good relative to his active week, but still a bit sore. Reports he is 85% improved since starting PT.  Feels he is ready to wrap up with PT next session.     Pertinent History  laminectomy L2/3, L4/5, spondylolisthesis, CAD (stent), possible stroke    Patient Stated Goals  To  get guideposts for what he can and cannont do; to ball room dance    Currently in Pain?  Yes    Pain Score  1     Pain Location  Buttocks    Pain Orientation  Left    Pain Descriptors / Indicators  Sore    Pain Type  Acute pain                       [facility] Adult PT Treatment/Exercise - 02/16/18 0001      Lumbar Exercises: Stretches   Hip Flexor Stretch  Right;Left;30 seconds;2 reps    Hip Flexor Stretch Limitations  mod thomas with strap to tol    Figure 4 Stretch  30 seconds;With overpressure;2 reps   with PT OP; 1st set supine, 2nd set sitting   Other Lumbar Stretch Exercise  KTOS 2x30" each side to tol   1st set supine, 2nd set sitting     Lumbar Exercises: Aerobic   Nustep  Lvl 4, 6 min       Lumbar Exercises: Seated   Other Seated Lumbar Exercises   sitting hip IR w/ yellow TB x10; B hip ER with green TB x10      Lumbar Exercises: Supine   Dead Bug  3 seconds;20 reps   cues to decrease speed   Dead Bug Limitations  alt UE/LE raise      Lumbar Exercises: Quadruped   Other Quadruped Lumbar Exercises  thread the needle x10 each side with yellow TB resistance   cues to avoid jerking or quick movements and core contractio            PT Education - 02/16/18 1059    Education Details  update and consolidation to HEP; administered yellow and green TB    Person(s) Educated  Patient    Methods  Explanation;Demonstration;Tactile cues;Verbal cues;Handout    Comprehension  Verbalized understanding;Returned demonstration       PT Short Term Goals - 01/26/18 1029      PT SHORT TERM GOAL #1   Title  Independent with initial HEP    Time  2    Period  Weeks    Status  Achieved      PT SHORT TERM GOAL #2   Title  Patient to understand the importance of a neutral spine to protect his spondylosis and laminectomies.    Time  2    Period  Weeks    Status  Achieved        PT Long Term Goals - 01/26/18 1029      PT LONG TERM GOAL #1   Title   Independent with advanced HEP/gym program as indicated including flexibility    Time  4    Period  Weeks    Status  Partially Met   met for current, however  intermittent   Target Date  02/23/18      PT LONG TERM GOAL #2   Title  Pt able to demonstrate/verbalize correct body mechanics with ADLS to prevent further injury.     Time  6    Period  Weeks    Status  Achieved      PT LONG TERM GOAL #3   Title  Patient able to perform rotational strengthening activities in the spine with good form to allow return to ballroom dancing.    Time  4    Period  Weeks    Status  On-going   initiated today   Target Date  02/23/18      PT LONG TERM GOAL #4   Title  Patient able to sleep without waking from back pain.    Time  6    Period  Weeks    Status  Achieved            Plan - 02/16/18 1059    Clinical Impression Statement  Patient arrived to session with report of 85% improvement since starting PT. Reports he feels comfortable with wrapping up with PT next session. Patient reported that he had a very active week since last session and had reoccurrence of L sided buttock pain. Reviewed LE stretches as patient reports not performing these at home. Good relief of pain with sitting KTOS stretch. Re-administered stretches to patient. Addressed hip strengthening with sitting hip IR and ER and updated HEP to include these exercises. Patient reporting no pain at end of session.     PT Treatment/Interventions  ADLs/Self Care Home Management;Electrical Stimulation;Moist Heat;Therapeutic exercise;Therapeutic activities;Neuromuscular re-education;Patient/family education;Manual techniques;Dry needling    PT Next Visit Plan  D/C next session    Consulted and Agree with Plan of Care  Patient       Patient will benefit from skilled therapeutic intervention in order to improve the following deficits and impairments:  Pain, Postural dysfunction, Decreased range of motion, Decreased strength, Impaired  flexibility  Visit Diagnosis: Chronic midline low back pain, unspecified whether sciatica present  Abnormal posture     Problem List Patient Active Problem List   Diagnosis Date Noted  . HNP (herniated nucleus pulposus), lumbar 10/21/2017  . Viral upper respiratory tract infection with cough 07/06/2017  . Current use of beta blocker 08/09/2015  . Moderate persistent asthma without complication 60/60/0459  . Other allergic rhinitis 02/20/2015  . PCP NOTES >>> 01/11/2015  . Cerebellar infarct ([place]) 06/26/2014  . BPH (benign prostatic hyperplasia) 06/22/2014  . Annual physical exam 02/20/2014  . Amnesia 02/20/2014  . Hyperglycemia 02/05/2013  . Benign paroxysmal positional vertigo 11/19/2012  . CAD (coronary artery disease) 02/07/2012  . Gastroesophageal reflux disease without esophagitis 01/01/2009  . SKIN CANCER, HX OF 12/28/2007  . Hyperlipidemia 06/16/2007  . Essential hypertension 06/16/2007  . Asthma, chronic 06/16/2007  . LUMBOSACRAL RADICULOPATHY 03/01/2007  . NEPHROLITHIASIS, HX OF 05/14/2006    Janene Harvey, PT, DPT 02/16/18 11:49 AM   [facility] [address]  [place], [place], [address] Phone: [phone]   Fax:  [phone]  Name: George Alvarez MRN: [id number] Date of Birth: Jul 24, 1945

## 2018-02-16 NOTE — Progress Notes (Signed)
\   Chief Complaint  Patient presents with  . Follow-up    CAD    History of Present Illness: 72 yo male with history of HTN, HLD and CAD who is here today for follow up. He underwent a stress test on 02/05/12 which was suggestive of ischemia. Cardiac cath on 02/06/12 with 95% proximal OM2 lesion and a 60% mid RCA lesion. EF was 65%. His OM2 was very large in caliber and was treated with bare-metal stents x 2. Medical management of RCA stenosis which was felt to be moderate. He has been on Plavix. He has chronic dizziness and is felt to have vertigo. Normal stress myoview December 2015. He has been limited by chronic back pain due to spinal stenosis but following back surgery in July 2019 his back pain has resolved.   He is here today for follow up. The patient denies any chest pain, dyspnea, palpitations, lower extremity edema, orthopnea, PND, dizziness, near syncope or syncope. He is very active.   Primary Care Physician: Colon Branch, MD  Past Medical History:  Diagnosis Date  . Anal fissure   . Arthritis   . Asthma, chronic 06/16/2007   Qualifier: Diagnosis of  By: Linna Darner MD, Gwyndolyn Saxon   Onset:as child Triggers (environmental, infectious, allergic): all, mainly environmental triggers Rescue inhaler RWE:RXVQMG Maintenance medications/ response:Singulair,Qvar Smoking history:never Family history pulmonary disease: no    . Basal cell carcinoma of chest wall 2016   1.8 cm, treated with electrodessication and currettage  . Benign paroxysmal positional vertigo 11/19/2012   Diagn[facility] at [facility]  Physical therapy appointment pending   . BPH (benign prostatic hyperplasia)    With urinary obstruction  . Coronary artery disease    a. 02/2012 Cath/PCI: LM 10, LAD min irregs, LCX large, OM1 sm, 40 ost, OM2 95p (5.0x16 Veriflex & 4.5x12 Veriflex BMS'), RCA 30p, 40m, 30d, PDA/PLA min irregs, EF 65%  . Diverticulosis   . Erectile dysfunction due to arterial insufficiency   . Fundic  gland polyps of stomach, benign 2010  . GERD (gastroesophageal reflux disease)   . History of elevated PSA   . History of kidney stones   . Hyperlipidemia   . Hypertension   . Nocturia   . Perennial allergic rhinitis   . Peyronie's disease   . Skin cancer    Basal and squamous cell cancers, greater than 20  . Stroke ([place]) 2016  . Tubular adenoma of colon 2010    Past Surgical History:  Procedure Laterality Date  . COLONOSCOPY  2003, 2011   negative  . CORONARY ANGIOPLASTY WITH STENT PLACEMENT  02/06/2012   OM2  bare metal   . EPIDURAL BLOCK INJECTION  04-2015   Dr Brien Few  . epidural steroids  2007, 2009   X 2 @ cervical &, lumbar)  . HERNIA REPAIR    . INTRAVASCULAR ULTRASOUND  02/06/2012   Procedure: INTRAVASCULAR ULTRASOUND;  Surgeon: [name], MD;  Location: [facility] LAB;  Service: Cardiovascular;;  . LEFT HEART CATHETERIZATION WITH CORONARY ANGIOGRAM N/A 02/06/2012   Procedure: LEFT HEART CATHETERIZATION WITH CORONARY ANGIOGRAM;  Surgeon: [name], MD;  Location: [facility] CATH LAB;  Service: Cardiovascular;  Laterality: N/A;  . LUMBAR LAMINECTOMY/DECOMPRESSION MICRODISCECTOMY N/A 10/21/2017   Procedure: Lumbar Two-Three, Lumbar Four-Five Discectomy;  Surgeon: Ashok Pall, MD;  Location: [facility];  Service: Neurosurgery;  Laterality: N/A;  . PERCUTANEOUS CORONARY STENT INTERVENTION (PCI-S)  02/06/2012   Procedure: PERCUTANEOUS CORONARY STENT INTERVENTION (PCI-S);  Surgeon:  [name], MD;  Location[facility];  Service: Cardiovascular;;  . septoplasty    . TONSILLECTOMY AND ADENOIDECTOMY    . TRIGGER FINGER RELEASE    . UPPER GASTROINTESTINAL ENDOSCOPY  2011   gastric polyps  . VASECTOMY      Current Outpatient Medications  Medication Sig Dispense Refill  . albuterol (PROVENTIL HFA;VENTOLIN HFA) 108 (90 Base) MCG/ACT inhaler Inhale 2 puffs into the lungs every 6 (six) hours as needed for wheezing or shortness of breath.    Marland Kitchen azelastine  (ASTELIN) 0.1 % nasal spray Place 2 sprays into both nostrils at bedtime as needed for rhinitis or allergies. 30 mL 5  . cholecalciferol (VITAMIN D) 1000 units tablet Take 1,000 Units by mouth daily.    . clopidogrel (PLAVIX) 75 MG tablet TAKE 1 TABLET BY MOUTH EVERY DAY 90 tablet 0  . famotidine (PEPCID) 20 MG tablet TAKE 1 TABLET BY MOUTH EVERY DAY 90 tablet 0  . FLOVENT HFA 110 MCG/ACT inhaler ONE PUFF TWICE A DAY TO PREVENT COUGHING AND WHEEZING. RINSE, GARGLE AND SPIT OUT AFTER USE  5  . fluticasone (FLONASE) 50 MCG/ACT nasal spray ONE SPRAY EACH NOSTRIL TWICE A DAY IF NEEDED FOR STUFFY NOSE. (Patient taking differently: Instill 1 spray in to each nostril twice daily) 16 g 2  . hydrocortisone 2.5 % cream Apply 1 application topically 2 (two) times daily.  1  . metoprolol tartrate (LOPRESSOR) 25 MG tablet Take 0.5 tablets (12.5 mg total) by mouth 2 (two) times daily. 90 tablet 1  . montelukast (SINGULAIR) 10 MG tablet ONE TABLET ONCE A DAY FOR COUGHING OR WHEEZING (Patient taking differently: Take 10 mg by mouth every evening. ) 30 tablet 0  . montelukast (SINGULAIR) 10 MG tablet TAKE 1 TABLET BY MOUTH EVERYDAY AT BEDTIME 30 tablet 4  . Multiple Vitamin (MULTIVITAMIN WITH MINERALS) TABS tablet Take 1 tablet by mouth daily.    Marland Kitchen olmesartan-hydrochlorothiazide (BENICAR HCT) 20-12.5 MG tablet Take 1 tablet by mouth daily. 90 tablet 1  . Omega-3 Fatty Acids (FISH OIL) 1200 MG CAPS Take 1,200 mg by mouth daily.    . rosuvastatin (CRESTOR) 40 MG tablet Take 1 tablet (40 mg total) by mouth daily. (Patient taking differently: Take 40 mg by mouth every evening. ) 90 tablet 1  . tadalafil (CIALIS) 5 MG tablet Take 5 mg by mouth daily as needed for erectile dysfunction.    . tamsulosin (FLOMAX) 0.4 MG CAPS capsule Take 1 capsule (0.4 mg total) by mouth daily. 15 capsule 0   No current facility-administered medications for this visit.     Allergies  Allergen Reactions  . Aspirin Swelling    Angioedema  in 1980s Medi Alert bracelet recommended  . Nifedipine Dermatitis and Rash    edema    Social History   Socioeconomic History  . Marital status: Married    Spouse name: Not on file  . Number of children: 3  . Years of education: Not on file  . Highest education level: Not on file  Occupational History  . Occupation: Higher education careers adviser   Social Needs  . Financial resource strain: Not on file  . Food insecurity:    Worry: Not on file    Inability: Not on file  . Transportation needs:    Medical: Not on file    Non-medical: Not on file  Tobacco Use  . Smoking status: Never Smoker  . Smokeless tobacco: Never Used  Substance and Sexual Activity  . Alcohol  use: Yes    Alcohol/week: 0.0 standard drinks    Comment: Wine occasionally  . Drug use: No  . Sexual activity: Yes    Partners: Female  Lifestyle  . Physical activity:    Days per week: Not on file    Minutes per session: Not on file  . Stress: Not on file  Relationships  . Social connections:    Talks on phone: Not on file    Gets together: Not on file    Attends religious service: Not on file    Active member of club or organization: Not on file    Attends meetings of clubs or organizations: Not on file    Relationship status: Not on file  . Intimate partner violence:    Fear of current or ex partner: Not on file    Emotionally abused: Not on file    Physically abused: Not on file    Forced sexual activity: Not on file  Other Topics Concern  . Not on file  Social History Narrative   3 daughters, 45 g-children   lifes w/ wife in [place]    Family History  Problem Relation Age of Onset  . Heart attack Father 36       died @ 96  . Heart disease Mother   . Breast cancer Mother   . Esophageal cancer Mother        Barrett's  . Heart attack Maternal Grandmother        after 65  . Dementia Maternal Grandmother        CVA  . Breast cancer Maternal Grandmother   . Diabetes Maternal Grandmother    . Stroke Maternal Grandmother        in 68s  . Heart attack Paternal Uncle 19  . Breast cancer Maternal Aunt   . Colon cancer Neg Hx   . Prostate cancer Neg Hx   . Colon polyps Neg Hx     Review of Systems:  As stated in the HPI and otherwise negative.   BP 134/80   Pulse (!) 58   Ht 5' 9.1" (1.755 m)   Wt 225 lb 9.6 oz (102.3 kg)   SpO2 96%   BMI 33.22 kg/m   Physical Examination:  General: Well developed, well nourished, NAD  HEENT: OP clear, mucus membranes moist  SKIN: warm, dry. No rashes. Neuro: No focal deficits  Musculoskeletal: Muscle strength 5/5 all ext  Psychiatric: Mood and affect normal  Neck: No JVD, no carotid bruits, no thyromegaly, no lymphadenopathy.  Lungs:Clear bilaterally, no wheezes, rhonci, crackles Cardiovascular: Regular rate and rhythm. No murmurs, gallops or rubs. Abdomen:Soft. Bowel sounds present. Non-tender.  Extremities: No lower extremity edema. Pulses are 2 + in the bilateral DP/PT.  Cardiac cath 02/06/12: Left main: 10% distal stenosis.  Left Anterior Descending Artery: Large caliber vessel that courses to the apex with mild luminal irregularities proximal and distal.  Circumflex Artery: Very large caliber vessel. The first OM is small and has an ostial 40% stenosis. The second OM is very large (5.54mm) and has a 95% stenosis in the proximal portion of the vessel.  Right Coronary Artery: Large, dominant artery with 30% proximal stenosis, 60% mid stenosis, diffuse 30% distal disease. The PDA and PLA are patent with mild plaque disease.  Left Ventricular Angiogram: LVEF=65%  Impression:  1. Double vessel CAD  2. Unstable angina  3. Normal LV systolic function  4. Successful PTCA/bare metal stent x 1 OM2.   EKG:  EKG is ordered today. The ekg ordered today demonstrates sinus brady, 58 bpm  Recent Labs: 07/23/2017: TSH 2.38 10/16/2017: BUN 19; Creatinine, Ser 1.02; Hemoglobin 14.9; Platelets 139; Potassium 4.3; Sodium 140   Lipid  Panel    Component Value Date/Time   CHOL 100 01/22/2017 0959   TRIG 214.0 (H) 01/22/2017 0959   HDL 33.10 (L) 01/22/2017 0959   CHOLHDL 3 01/22/2017 0959   VLDL 42.8 (H) 01/22/2017 0959   LDLCALC 50 04/24/2015 1558   LDLDIRECT 45.0 01/22/2017 0959     Wt Readings from Last 3 Encounters:  02/17/18 225 lb 9.6 oz (102.3 kg)  01/25/18 220 lb 9.6 oz (100.1 kg)  10/21/17 225 lb 4.8 oz (102.2 kg)     Other studies Reviewed: Additional studies/ records that were reviewed today include: . Review of the above records demonstrates:    Assessment and Plan:   1. CAD without angina: No chest pain. Continue Plavix, statin and beta blocker. He is intolerant of ASA.    2. HTN: BP is controlled. No changes today  3. Hyperlipidemia: Lipids controlled in October 2018. Continue statin. He will discuss repeat lipid testing with Dr. Larose Kells.   Current medicines are reviewed at length with the patient today.  The patient does not have concerns regarding medicines.  The following changes have been made:  no change  Labs/ tests ordered today include:   No orders of the defined types were placed in this encounter.   Disposition:   FU with me in 12  months  Signed, Lauree Chandler, MD 02/17/2018 11:17 AM    [facility] [place], [place], [place]  [address] Phone: [phone]; Fax: [phone]

## 2018-02-17 ENCOUNTER — Ambulatory Visit: Payer: PPO | Admitting: Cardiovascular Disease

## 2018-02-17 ENCOUNTER — Encounter: Payer: Self-pay | Admitting: Internal Medicine

## 2018-02-17 ENCOUNTER — Encounter: Payer: Self-pay | Admitting: Cardiovascular Disease

## 2018-02-17 VITALS — BP 134/80 | HR 58 | Ht 69.1 in | Wt 225.6 lb

## 2018-02-17 DIAGNOSIS — I10 Essential (primary) hypertension: Secondary | ICD-10-CM | POA: Diagnosis not present

## 2018-02-17 DIAGNOSIS — E7800 Pure hypercholesterolemia, unspecified: Secondary | ICD-10-CM

## 2018-02-17 DIAGNOSIS — I2510 Atherosclerotic heart disease of native coronary artery without angina pectoris: Secondary | ICD-10-CM | POA: Diagnosis not present

## 2018-02-17 NOTE — Patient Instructions (Signed)

## 2018-02-17 NOTE — Addendum Note (Signed)
Addended by: Mendel Ryder on: 02/17/2018 01:32 PM   Modules accepted: Orders

## 2018-02-22 ENCOUNTER — Encounter: Payer: Self-pay | Admitting: Internal Medicine

## 2018-02-22 ENCOUNTER — Ambulatory Visit (INDEPENDENT_AMBULATORY_CARE_PROVIDER_SITE_OTHER): Payer: PPO | Admitting: Internal Medicine

## 2018-02-22 VITALS — BP 126/68 | HR 63 | Temp 97.8°F | Resp 16 | Ht 69.1 in | Wt 222.4 lb

## 2018-02-22 DIAGNOSIS — R739 Hyperglycemia, unspecified: Secondary | ICD-10-CM

## 2018-02-22 DIAGNOSIS — I2510 Atherosclerotic heart disease of native coronary artery without angina pectoris: Secondary | ICD-10-CM | POA: Diagnosis not present

## 2018-02-22 DIAGNOSIS — R972 Elevated prostate specific antigen [PSA]: Secondary | ICD-10-CM | POA: Diagnosis not present

## 2018-02-22 DIAGNOSIS — R5383 Other fatigue: Secondary | ICD-10-CM | POA: Diagnosis not present

## 2018-02-22 DIAGNOSIS — I10 Essential (primary) hypertension: Secondary | ICD-10-CM | POA: Diagnosis not present

## 2018-02-22 LAB — LIPID PANEL
CHOL/HDL RATIO: 3
CHOLESTEROL: 111 mg/dL (ref 0–200)
HDL: 33.8 mg/dL — ABNORMAL LOW (ref >39.00)
NonHDL: 77.15
Triglycerides: 210 mg/dL — ABNORMAL HIGH (ref 0.0–149.0)
VLDL: 42 mg/dL — AB (ref 0.0–40.0)

## 2018-02-22 LAB — COMPREHENSIVE METABOLIC PANEL
ALBUMIN: 4.6 g/dL (ref 3.5–5.2)
ALT: 24 U/L (ref 0–53)
AST: 22 U/L (ref 0–37)
Alkaline Phosphatase: 52 U/L (ref 39–117)
BUN: 21 mg/dL (ref 6–23)
CHLORIDE: 102 meq/L (ref 96–112)
CO2: 29 mEq/L (ref 19–32)
Calcium: 9.9 mg/dL (ref 8.4–10.5)
Creatinine, Ser: 1.16 mg/dL (ref 0.40–1.50)
GFR: 66.16 mL/min (ref >60.00)
Glucose, Bld: 108 mg/dL — ABNORMAL HIGH (ref 70–99)
POTASSIUM: 4.2 meq/L (ref 3.5–5.1)
SODIUM: 140 meq/L (ref 135–145)
Total Bilirubin: 0.8 mg/dL (ref 0.2–1.2)
Total Protein: 6.6 g/dL (ref 6.0–8.3)

## 2018-02-22 LAB — LDL CHOLESTEROL, DIRECT: LDL DIRECT: 55 mg/dL

## 2018-02-22 LAB — HEMOGLOBIN A1C: Hgb A1c MFr Bld: 6 % (ref 4.6–6.5)

## 2018-02-22 MED ORDER — OLMESARTAN MEDOXOMIL-HCTZ 20-12.5 MG PO TABS: 1.0000 | ORAL_TABLET | Freq: Every day | ORAL | 1 refills | Status: DC

## 2018-02-22 MED ORDER — METOPROLOL TARTRATE 25 MG PO TABS: 12.5000 mg | ORAL_TABLET | Freq: Two times a day (BID) | ORAL | 1 refills | Status: DC

## 2018-02-22 MED ORDER — ROSUVASTATIN CALCIUM 40 MG PO TABS: 40.0000 mg | ORAL_TABLET | Freq: Every evening | ORAL | 1 refills | Status: DC

## 2018-02-22 MED ORDER — AZELASTINE HCL 0.1 % NA SOLN: 2.0000 | Freq: Every evening | NASAL | 5 refills | Status: DC | PRN

## 2018-02-22 NOTE — Progress Notes (Signed)
Pre visit review using our clinic review tool, if applicable. No additional management support is needed unless otherwise documented below in the visit note. 

## 2018-02-22 NOTE — Assessment & Plan Note (Signed)
Hyperglycemia: Check a A1c HTN: Continue when metoprolol, Benicar HCT.  Checking a CMP Hyperlipidemia: On Crestor, check a FLP Fatigue, decreased libido, poor erections: See last visit, initial testosterone was low, repeated levels were normal.  Vitamin D was in the low side of normal, on supplements. Overall is much improved. Elevated PSA: Urologist consider biopsy, pt request my opinion, I think that is completely appropriate, he is only 77 and doing well. CAD: Seen by cardiology 02/17/2018, felt to be stable. Multiple refills RTC 07/2018 CPX

## 2018-02-22 NOTE — Patient Instructions (Signed)
GO TO THE LAB : Get the blood work     GO TO THE FRONT DESK Schedule your next appointment for a  Physical exam by 07-2018

## 2018-02-22 NOTE — Progress Notes (Signed)
Subjective:    Patient ID: George Alvarez, male    DOB: [birth year], 72 y.o.   MRN: [id number]  DOS:  02/22/2018 Type of visit - description : rov Since the last office visit he is doing well. He reported fatigue: Improved Had back surgery: Doing well.  Temporarily had a Foley catheter and took Flomax, Foley is out. CAD: Note from cardiology reviewed Elevated PSA: Per urology, they are considering a biopsy, he likes my opinion  Review of Systems  Denies chest pain no difficulty breathing No nausea, vomiting, diarrhea Past Medical History:  Diagnosis Date  . Anal fissure   . Arthritis   . Asthma, chronic 06/16/2007   Qualifier: Diagnosis of  By: Linna Darner MD, Gwyndolyn Saxon   Onset:as child Triggers (environmental, infectious, allergic): all, mainly environmental triggers Rescue inhaler EPP:IRJJOA Maintenance medications/ response:Singulair,Qvar Smoking history:never Family history pulmonary disease: no    . Basal cell carcinoma of chest wall 2016   1.8 cm, treated with electrodessication and currettage  . Benign paroxysmal positional vertigo 11/19/2012   Diagnosed at [facility]: [facility]  Physical therapy appointment pending   . BPH (benign prostatic hyperplasia)    With urinary obstruction  . Coronary artery disease    a. 02/2012 Cath/PCI: LM 10, LAD min irregs, LCX large, OM1 sm, 40 ost, OM2 95p (5.0x16 Veriflex & 4.5x12 Veriflex BMS'), RCA 30p, 75m, 30d, PDA/PLA min irregs, EF 65%  . Diverticulosis   . Erectile dysfunction due to arterial insufficiency   . Fundic gland polyps of stomach, benign 2010  . GERD (gastroesophageal reflux disease)   . History of elevated PSA   . History of kidney stones   . Hyperlipidemia   . Hypertension   . Nocturia   . Perennial allergic rhinitis   . Peyronie's disease   . Skin cancer    Basal and squamous cell cancers, greater than 20  . Stroke ([place]) 2016  . Tubular adenoma of colon 2010    Past Surgical History:  Procedure Laterality  Date  . CORONARY ANGIOPLASTY WITH STENT PLACEMENT  02/06/2012   OM2  bare metal   . EPIDURAL BLOCK INJECTION  04-2015   Dr Brien Few  . epidural steroids  2007, 2009   X 2 @ cervical &, lumbar)  . HERNIA REPAIR    . INTRAVASCULAR ULTRASOUND  02/06/2012   Procedure: INTRAVASCULAR ULTRASOUND;  Surgeon: [name], MD;  Location: [facility] C[facility] LAB;  Service: Cardiovascular;;  . LEFT HEART CATHETERIZATION WITH CORONARY ANGIOGRAM N/A 02/06/2012   Procedure: LEFT HEART CATHETERIZATION WITH CORONARY ANGIOGRAM;  Surgeon: [name], MD;  Location: [facility] CATH LAB;  Service: Cardiovascular;  Laterality: N/A;  . LUMBAR LAMINECTOMY/DECOMPRESSION MICRODISCECTOMY N/A 10/21/2017   Procedure: Lumbar Two-Three, Lumbar Four-Five Discectomy;  Surgeon: Ashok Pall, MD;  Location: [place];  Service: Neurosurgery;  Laterality: N/A;  . PERCUTANEOUS CORONARY STENT INTERVENTION (PCI-S)  02/06/2012   Procedure: PERCUTANEOUS CORONARY STENT INTERVENTION (PCI-S);  Surgeon: [name], MD;  [facility] LAB;  Service: Cardiovascular;;  . septoplasty    . TONSILLECTOMY AND ADENOIDECTOMY    . TRIGGER FINGER RELEASE    . UPPER GASTROINTESTINAL ENDOSCOPY  2011   gastric polyps  . VASECTOMY      Social History   Socioeconomic History  . Marital status: Married    Spouse name: Not on file  . Number of children: 3  . Years of education: Not on file  . Highest education level: Not on file  Occupational History  .  Occupation: Higher education careers adviser   Social Needs  . Financial resource strain: Not on file  . Food insecurity:    Worry: Not on file    Inability: Not on file  . Transportation needs:    Medical: Not on file    Non-medical: Not on file  Tobacco Use  . Smoking status: Never Smoker  . Smokeless tobacco: Never Used  Substance and Sexual Activity  . Alcohol use: Yes    Alcohol/week: 0.0 standard drinks    Comment: Wine occasionally  . Drug use: No  . Sexual activity:  Yes    Partners: Female  Lifestyle  . Physical activity:    Days per week: Not on file    Minutes per session: Not on file  . Stress: Not on file  Relationships  . Social connections:    Talks on phone: Not on file    Gets together: Not on file    Attends religious service: Not on file    Active member of club or organization: Not on file    Attends meetings of clubs or organizations: Not on file    Relationship status: Not on file  . Intimate partner violence:    Fear of current or ex partner: Not on file    Emotionally abused: Not on file    Physically abused: Not on file    Forced sexual activity: Not on file  Other Topics Concern  . Not on file  Social History Narrative   3 daughters, 53 g-children   lifes w/ wife in [place] as of 02/22/2018      Reactions   Aspirin Swelling   Angioedema in 1980s Medi Alert bracelet recommended   Nifedipine Dermatitis, Rash   edema      Medication List        Accurate as of 02/22/18  8:00 PM. Always use your most recent med list.          albuterol 108 (90 Base) MCG/ACT inhaler Commonly known as:  PROVENTIL HFA;VENTOLIN HFA Inhale 2 puffs into the lungs every 6 (six) hours as needed for wheezing or shortness of breath.   azelastine 0.1 % nasal spray Commonly known as:  ASTELIN Place 2 sprays into both nostrils at bedtime as needed for rhinitis or allergies.   cholecalciferol 1000 units tablet Commonly known as:  VITAMIN D Take 1,000 Units by mouth daily.   clopidogrel 75 MG tablet Commonly known as:  PLAVIX TAKE 1 TABLET BY MOUTH EVERY DAY   famotidine 20 MG tablet Commonly known as:  PEPCID TAKE 1 TABLET BY MOUTH EVERY DAY   Fish Oil 1200 MG Caps Take 1,200 mg by mouth daily.   FLOVENT HFA 110 MCG/ACT inhaler Generic drug:  fluticasone ONE PUFF TWICE A DAY TO PREVENT COUGHING AND WHEEZING. RINSE, GARGLE AND SPIT OUT AFTER USE   fluticasone 50 MCG/ACT nasal spray Commonly known as:   FLONASE ONE SPRAY EACH NOSTRIL TWICE A DAY IF NEEDED FOR STUFFY NOSE.   hydrocortisone 2.5 % cream Apply 1 application topically 2 (two) times daily.   metoprolol tartrate 25 MG tablet Commonly known as:  LOPRESSOR Take 0.5 tablets (12.5 mg total) by mouth 2 (two) times daily.   montelukast 10 MG tablet Commonly known as:  SINGULAIR ONE TABLET ONCE A DAY FOR COUGHING OR WHEEZING   multivitamin with minerals Tabs tablet Take 1 tablet by mouth daily.   olmesartan-hydrochlorothiazide 20-12.5 MG tablet Commonly known as:  [employer]  HCT Take 1 tablet by mouth daily.   rosuvastatin 40 MG tablet Commonly known as:  CRESTOR Take 1 tablet (40 mg total) by mouth every evening.   tadalafil 5 MG tablet Commonly known as:  CIALIS Take 5 mg by mouth daily as needed for erectile dysfunction.   tamsulosin 0.4 MG Caps capsule Commonly known as:  FLOMAX Take 1 capsule (0.4 mg total) by mouth daily.           Objective:   Physical Exam BP 126/68 (BP Location: Left Arm, Patient Position: Sitting, Cuff Size: Normal)   Pulse 63   Temp 97.8 F (36.6 C) (Oral)   Resp 16   Ht 5' 9.1" (1.755 m)   Wt 222 lb 6 oz (100.9 kg)   SpO2 98%   BMI 32.74 kg/m  General:   Well developed, NAD, BMI noted. HEENT:  Normocephalic . Face symmetric, atraumatic Lungs:  CTA B Normal respiratory effort, no intercostal retractions, no accessory muscle use. Heart: RRR,  no murmur.  No pretibial edema bilaterally  Skin: Not pale. Not jaundice Neurologic:  alert & oriented X3.  Speech normal, gait appropriate for age and unassisted Psych--  Cognition and judgment appear intact.  Cooperative with normal attention span and concentration.  Behavior appropriate. No anxious or depressed appearing.       Assessment & Plan:    Assessment  Prediabetes HTN Hyperlipidemia CV: Dr Angelena Form --CAD --Stroke, seen in a MRI Asthma- Dr Darcey Nora  MSK --DJD knees used to see Dr Len Childs 2016, local  injections --Spinal stenosis (neck-back) - Dr Cyndy Freeze dx ~ 2008 via MRI neck, back, had local injections 2008; then 04-2015 GERD Benign positional vertigo-- did PT before GU: --Elevated PSA -- Dr Jeffie Pollock --Peyronie's  Disease Skin cancer : [facility] Dr Susie Cassette   PLAN: Hyperglycemia: Check a A1c HTN: Continue when metoprolol, Benicar HCT.  Checking a CMP Hyperlipidemia: On Crestor, check a FLP Fatigue, decreased libido, poor erections: See last visit, initial testosterone was low, repeated levels were normal.  Vitamin D was in the low side of normal, on supplements. Overall is much improved. Elevated PSA: Urologist consider biopsy, pt request my opinion, I think that is completely appropriate, he is only 60 and doing well. CAD: Seen by cardiology 02/17/2018, felt to be stable. Multiple refills RTC 07/2018 CPX

## 2018-02-23 ENCOUNTER — Encounter: Payer: Self-pay | Admitting: Physical Therapy

## 2018-02-23 ENCOUNTER — Ambulatory Visit: Payer: PPO | Admitting: Physical Therapy

## 2018-02-23 ENCOUNTER — Encounter: Payer: Self-pay | Admitting: Internal Medicine

## 2018-02-23 DIAGNOSIS — H25811 Combined forms of age-related cataract, right eye: Secondary | ICD-10-CM | POA: Diagnosis not present

## 2018-02-23 DIAGNOSIS — H524 Presbyopia: Secondary | ICD-10-CM | POA: Diagnosis not present

## 2018-02-23 DIAGNOSIS — H5213 Myopia, bilateral: Secondary | ICD-10-CM | POA: Diagnosis not present

## 2018-02-23 DIAGNOSIS — E119 Type 2 diabetes mellitus without complications: Secondary | ICD-10-CM | POA: Diagnosis not present

## 2018-02-23 DIAGNOSIS — G8929 Other chronic pain: Secondary | ICD-10-CM

## 2018-02-23 DIAGNOSIS — M545 Low back pain: Principal | ICD-10-CM

## 2018-02-23 DIAGNOSIS — H04123 Dry eye syndrome of bilateral lacrimal glands: Secondary | ICD-10-CM | POA: Diagnosis not present

## 2018-02-23 DIAGNOSIS — R293 Abnormal posture: Secondary | ICD-10-CM

## 2018-02-23 NOTE — Therapy (Signed)
[facility] [address]  [place], [place], [address] Phone: [phone]   Fax:  [phone]  Physical Therapy Discharge Summary  Patient Details  Name: George Alvarez MRN: [id number] Date of Birth: 1945-05-06 Referring Provider (PT): Ashok Pall MD   Progress Note Reporting Period 02/05/18 to 02/23/18  See note below for Objective Data and Assessment of Progress/Goals.    Encounter Date: 02/23/2018  PT End of Session - 02/23/18 1018    Visit Number  14    Number of Visits  14    Date for PT Re-Evaluation  02/23/18    PT Start Time  1011    PT Stop Time  1057    PT Time Calculation (min)  46 min    Activity Tolerance  Patient tolerated treatment well    Behavior During Therapy  WFL for tasks assessed/performed       Past Medical History:  Diagnosis Date  . Anal fissure   . Arthritis   . Asthma, chronic 06/16/2007   Qualifier: Diagnosis of  By: Linna Darner MD, Gwyndolyn Saxon   Onset:as child Triggers (environmental, infectious, allergic): all, mainly environmental triggers Rescue inhaler XLK:GMWNUU Maintenance medications/ response:Singulair,Qvar Smoking history:never Family history pulmonary disease: no    . Basal cell carcinoma of chest wall 2016   1.8 cm, treated with electrodessication and currettage  . Benign paroxysmal positional vertigo 11/19/2012   Diagnosed at [facility]  Physical therapy appointment pending   . BPH (benign prostatic hyperplasia)    With urinary obstruction  . Coronary artery disease    a. 02/2012 Cath/PCI: LM 10, LAD min irregs, LCX large, OM1 sm, 40 ost, OM2 95p (5.0x16 Veriflex & 4.5x12 Veriflex BMS'), RCA 30p, 62m 30d, PDA/PLA min irregs, EF 65%  . Diverticulosis   . Erectile dysfunction due to arterial insufficiency   . Fundic gland polyps of stomach, benign 2010  . GERD (gastroesophageal reflux disease)   . History of elevated PSA   . History of kidney stones   .  Hyperlipidemia   . Hypertension   . Nocturia   . Perennial allergic rhinitis   . Peyronie's disease   . Skin cancer    Basal and squamous cell cancers, greater than 20  . Stroke ([place] 2016  . Tubular adenoma of colon 2010    Past Surgical History:  Procedure Laterality Date  . CORONARY ANGIOPLASTY WITH STENT PLACEMENT  02/06/2012   OM2  bare metal   . EPIDURAL BLOCK INJECTION  04-2015   Dr BBrien Few . epidural steroids  2007, 2009   X 2 @ cervical &, lumbar)  . HERNIA REPAIR    . INTRAVASCULAR ULTRASOUND  02/06/2012   Procedure: INTRAVASCULAR ULTRASOUND;  Surgeon: CBurnell Blanks MD;  [facility];  Service: Cardiovascular;;  . LEFT HEART CATHETERIZATION WITH CORONARY ANGIOGRAM N/A 02/06/2012   Procedure: LEFT HEART CATHETERIZATION WITH CORONARY ANGIOGRAM;  Surgeon: CBurnell Blanks MD;  [facility];  Service: Cardiovascular;  Laterality: N/A;  . LUMBAR LAMINECTOMY/DECOMPRESSION MICRODISCECTOMY N/A 10/21/2017   Procedure: Lumbar Two-Three, Lumbar Four-Five Discectomy;  Surgeon: CAshok Pall MD;  [place]  Service: Neurosurgery;  Laterality: N/A;  . PERCUTANEOUS CORONARY STENT INTERVENTION (PCI-S)  02/06/2012   Procedure: PERCUTANEOUS CORONARY STENT INTERVENTION (PCI-S);  Surgeon: CBurnell Blanks MD;  [facility];  Service: Cardiovascular;;  . septoplasty    . TONSILLECTOMY AND ADENOIDECTOMY    . TRIGGER FINGER RELEASE    .  UPPER GASTROINTESTINAL ENDOSCOPY  2011   gastric polyps  . VASECTOMY      There were no vitals filed for this visit.  Subjective Assessment - 02/23/18 1008    Subjective  Reports he has been feeling good- raked leaves yesterday for over an hour and felt great. Reports 100% improvement since initial eval- notes improvements in pain levels, able to walk longer, lift, and returned to ballroom dancing.     Pertinent History  laminectomy L2/3, L4/5, spondylolisthesis, CAD (stent), possible stroke    Patient  Stated Goals  To get guideposts for what he can and cannont do; to ball room dance    Currently in Pain?  No/denies         [facility] PT Assessment - 02/23/18 0001      Observation/Other Assessments   Focus on Therapeutic Outcomes (FOTO)   Lumbar spine: 83 (17% limited, 37% predicted)                   [facility] Adult PT Treatment/Exercise - 02/23/18 0001      Exercises   Exercises  Lumbar;Knee/Hip      Lumbar Exercises: Stretches   Hip Flexor Stretch  Right;Left;1 rep;30 seconds;Limitations    Hip Flexor Stretch Limitations  mod thomas with strap to tol    Figure 4 Stretch  1 rep;30 seconds;With overpressure   sitting; each LE   Other Lumbar Stretch Exercise  KTOS 30" each side to tol in sitting      Lumbar Exercises: Aerobic   Nustep  Lvl 4, 6 min       Lumbar Exercises: Standing   Other Standing Lumbar Exercises  standing resisted trunk rotation with blue TB 10x each side   reporting mild soreness in LB but denied pain     Lumbar Exercises: Supine   Dead Bug  10 reps    Dead Bug Limitations  B UE/LE suspended at 90 deg; 2x10    Bridge with clamshell  10 reps;Limitations   red TB around knees; cues for form   Bridge with [employer] Limitations  10x to tolerance   mild c/o L adductor cramp which dissipated      Lumbar Exercises: Quadruped   Other Quadruped Lumbar Exercises  thread the needle x10 each side with yellow TB resistance      Knee/Hip Exercises: Seated   Other Seated Knee/Hip Exercises  B hip IR/ER with ball x15 each; yellow TB IR, green TB ER             PT Education - 02/23/18 1224    Education Details  update and consolidation of stretching, strengthening, and gym regimen HEP handouts; edu on progressing weighted resistance gradually as tolerated; administered yellow and blue band    Person(s) Educated  Patient    Methods  Explanation;Demonstration;Tactile cues;Verbal cues;Handout    Comprehension  Verbalized understanding;Returned  demonstration       PT Short Term Goals - 02/23/18 1018      PT SHORT TERM GOAL #1   Title  Independent with initial HEP    Time  2    Period  Weeks    Status  Achieved      PT SHORT TERM GOAL #2   Title  Patient to understand the importance of a neutral spine to protect his spondylosis and laminectomies.    Time  2    Period  Weeks    Status  Achieved        PT Long  Term Goals - 02/23/18 1018      PT LONG TERM GOAL #1   Title  Independent with advanced HEP/gym program as indicated including flexibility    Time  4    Period  Weeks    Status  Achieved   has returned to water aerobics and walking on track     PT LONG TERM GOAL #2   Title  Pt able to demonstrate/verbalize correct body mechanics with ADLS to prevent further injury.     Time  6    Period  Weeks    Status  Achieved      PT LONG TERM GOAL #3   Title  Patient able to perform rotational strengthening activities in the spine with good form to allow return to ballroom dancing.    Time  4    Period  Weeks    Status  Achieved   good tolerance      PT LONG TERM GOAL #4   Title  Patient able to sleep without waking from back pain.    Time  6    Period  Weeks    Status  Achieved            Plan - 02/23/18 1236    Clinical Impression Statement  Patient reports 100% improvement since initial eval- improvements noted in pain levels, walking tolerance, return to hobbies. Reports he has been compliant with HEP. Patient has met all goals at this time- has returned to water aerobics and walking on track at the gym. Has been educated on proper lifting mechanics and able to demonstrate good carryover. Also tolerates rotational strengthening activities with good form and no lasting pain. Spent considerable time today to update and consolidate stretching, strengthening, and gym regimen HEP handouts for continues physical activity compliance. Provided edu on progressing weighted resistance gradually and as tolerated.  Progressed resisted trunk rotation with increased banded resistance without pain. Provided cues for proper form with deadbug. Patient has returned to [facility] and is independent with HEP. D/C'd at this time.     PT Treatment/Interventions  ADLs/Self Care Home Management;Electrical Stimulation;Moist Heat;Therapeutic exercise;Therapeutic activities;Neuromuscular re-education;Patient/family education;Manual techniques;Dry needling    PT Next Visit Plan  D/C'd at this time    Consulted and Agree with Plan of Care  Patient       Patient will benefit from skilled therapeutic intervention in order to improve the following deficits and impairments:  Pain, Postural dysfunction, Decreased range of motion, Decreased strength, Impaired flexibility  Visit Diagnosis: Chronic midline low back pain, unspecified whether sciatica present  Abnormal posture     Problem List Patient Active Problem List   Diagnosis Date Noted  . HNP (herniated nucleus pulposus), lumbar 10/21/2017  . Viral upper respiratory tract infection with cough 07/06/2017  . Current use of beta blocker 08/09/2015  . Moderate persistent asthma without complication 04/09/7251  . Other allergic rhinitis 02/20/2015  . PCP NOTES >>> 01/11/2015  . Cerebellar infarct ([place]) 06/26/2014  . BPH (benign prostatic hyperplasia) 06/22/2014  . Annual physical exam 02/20/2014  . Amnesia 02/20/2014  . Hyperglycemia 02/05/2013  . Benign paroxysmal positional vertigo 11/19/2012  . CAD (coronary artery disease) 02/07/2012  . Gastroesophageal reflux disease without esophagitis 01/01/2009  . SKIN CANCER, HX OF 12/28/2007  . Hyperlipidemia 06/16/2007  . Essential hypertension 06/16/2007  . Asthma, chronic 06/16/2007  . LUMBOSACRAL RADICULOPATHY 03/01/2007  . NEPHROLITHIASIS, HX OF 05/14/2006    PHYSICAL THERAPY DISCHARGE SUMMARY  Visits from Start of Care: 14  Current functional level related to goals / functional outcomes: See above clinical  impression   Remaining deficits: None   Education / Equipment: HEP  Plan: Patient agrees to discharge.  Patient goals were met. Patient is being discharged due to meeting the stated rehab goals.  ?????      Janene Harvey, PT, DPT 02/23/18 12:41 PM   [facility] [address]  [place], [place], [address] Phone: [phone]   Fax:  [phone]  Name: NEERAJ HOUSAND MRN: [id number] Date of Birth: Dec 08, 1945

## 2018-02-26 DIAGNOSIS — C44612 Basal cell carcinoma of skin of right upper limb, including shoulder: Secondary | ICD-10-CM | POA: Diagnosis not present

## 2018-02-26 DIAGNOSIS — L218 Other seborrheic dermatitis: Secondary | ICD-10-CM | POA: Diagnosis not present

## 2018-02-26 DIAGNOSIS — D485 Neoplasm of uncertain behavior of skin: Secondary | ICD-10-CM | POA: Diagnosis not present

## 2018-02-26 DIAGNOSIS — L570 Actinic keratosis: Secondary | ICD-10-CM | POA: Diagnosis not present

## 2018-03-07 ENCOUNTER — Other Ambulatory Visit: Payer: Self-pay | Admitting: Cardiovascular Disease

## 2018-03-10 ENCOUNTER — Other Ambulatory Visit: Payer: Self-pay | Admitting: Cardiovascular Disease

## 2018-03-10 DIAGNOSIS — S61200A Unspecified open wound of right index finger without damage to nail, initial encounter: Secondary | ICD-10-CM | POA: Diagnosis not present

## 2018-03-12 ENCOUNTER — Encounter: Payer: Self-pay | Admitting: Internal Medicine

## 2018-03-12 DIAGNOSIS — R972 Elevated prostate specific antigen [PSA]: Secondary | ICD-10-CM | POA: Diagnosis not present

## 2018-03-12 LAB — PSA: PSA: 2.53

## 2018-03-24 ENCOUNTER — Other Ambulatory Visit: Payer: Self-pay

## 2018-03-24 DIAGNOSIS — N5201 Erectile dysfunction due to arterial insufficiency: Secondary | ICD-10-CM | POA: Diagnosis not present

## 2018-03-24 DIAGNOSIS — R351 Nocturia: Secondary | ICD-10-CM | POA: Diagnosis not present

## 2018-03-24 DIAGNOSIS — R972 Elevated prostate specific antigen [PSA]: Secondary | ICD-10-CM | POA: Diagnosis not present

## 2018-03-24 DIAGNOSIS — N401 Enlarged prostate with lower urinary tract symptoms: Secondary | ICD-10-CM | POA: Diagnosis not present

## 2018-03-24 MED ORDER — MONTELUKAST SODIUM 10 MG PO TABS: ORAL_TABLET | ORAL | 0 refills | Status: DC

## 2018-04-12 ENCOUNTER — Encounter: Payer: Self-pay | Admitting: Internal Medicine

## 2018-04-27 ENCOUNTER — Ambulatory Visit (INDEPENDENT_AMBULATORY_CARE_PROVIDER_SITE_OTHER): Payer: PPO | Admitting: Internal Medicine

## 2018-04-27 ENCOUNTER — Encounter: Payer: Self-pay | Admitting: Internal Medicine

## 2018-04-27 VITALS — BP 132/76 | HR 67 | Temp 97.6°F | Resp 16 | Ht 69.1 in | Wt 227.2 lb

## 2018-04-27 DIAGNOSIS — R21 Rash and other nonspecific skin eruption: Secondary | ICD-10-CM

## 2018-04-27 DIAGNOSIS — R972 Elevated prostate specific antigen [PSA]: Secondary | ICD-10-CM | POA: Diagnosis not present

## 2018-04-27 MED ORDER — TRIAMCINOLONE ACETONIDE 0.1 % EX LOTN: 1.0000 "application " | TOPICAL_LOTION | Freq: Three times a day (TID) | CUTANEOUS | 0 refills | Status: DC

## 2018-04-27 MED ORDER — KETOCONAZOLE 2 % EX CREA: 1.0000 "application " | TOPICAL_CREAM | Freq: Every day | CUTANEOUS | 0 refills | Status: DC

## 2018-04-27 MED ORDER — HYDROCORTISONE 2.5 % EX CREA: 1.0000 "application " | TOPICAL_CREAM | Freq: Two times a day (BID) | CUTANEOUS | 3 refills | Status: DC

## 2018-04-27 NOTE — Patient Instructions (Addendum)
For the genital area (fungal infection): Apply ketoconazole mixed with hydrocortisone 2.5% twice a day for 1 week Once better, keep the area dry with baby powder  For the abdomen (eczema): Apply the lotion called Kenalog twice a day Try to keep the area moist with Aveeno OTC.   Okay to try to wean off Flomax, although it may not be the culprit of your rash

## 2018-04-27 NOTE — Assessment & Plan Note (Signed)
Fungal infection: Dermatitis near the scrotum likely a fungal infection, recommend ketoconazole/steroids.  He has a hydrocortisone 2.5% prescribed by dermatology for eczema.  RF sent.    Eczema: Rash at the abdomen likely eczema, recommend Kenalog twice a day and moist the area with Aveeno.  Call if not better. Increased PSA: Patient reports that his PSA has decreased and urology is not planning to proceed w/ a Bx for now.  He takes Flomax 1 daily with complete resolution of symptoms.  I doubt Flomax is the culprit of his rash but if he likes to, okay to wean off of it. Follow-up already scheduled for April

## 2018-04-27 NOTE — Progress Notes (Signed)
Subjective:    Patient ID: George Alvarez, male    DOB: 1945-10-21, 73 y.o.   MRN: [id number]  DOS:  04/27/2018 Type of visit - description: acute Concerned about 2 different rashes: 1 started 3 days ago and is between the scrotum and the rectum.  It is painful when he wipes the area .  Also, back in November he developed an abdominal rash which is itchy.  It improved to some extent with hydrocortisone 2.5% that he has for eczema.  Also, is taking Flomax, he started around 10-2017 and wonder if that is the culprit of the rashes.   Review of Systems Denies any rectal pain, itching or bleeding No nocturia, dysuria, gross hematuria difficulty urinating  Past Medical History:  Diagnosis Date  . Anal fissure   . Arthritis   . Asthma, chronic 06/16/2007   Qualifier: Diagnosis of  By: Linna Darner MD, Gwyndolyn Saxon   Onset:as child Triggers (environmental, infectious, allergic): all, mainly environmental triggers Rescue inhaler PTW:SFKCLE Maintenance medications/ response:Singulair,Qvar Smoking history:never Family history pulmonary disease: no    . Basal cell carcinoma of chest wall 2016   1.8 cm, treated with electrodessication and currettage  . Benign paroxysmal positional vertigo 11/19/2012   Diagnosed at [facility], [facility]  Physical therapy appointment pending   . BPH (benign prostatic hyperplasia)    With urinary obstruction  . Coronary artery disease    a. 02/2012 Cath/PCI: LM 10, LAD min irregs, LCX large, OM1 sm, 40 ost, OM2 95p (5.0x16 Veriflex & 4.5x12 Veriflex BMS'), RCA 30p, 18m, 30d, PDA/PLA min irregs, EF 65%  . Diverticulosis   . Erectile dysfunction due to arterial insufficiency   . Fundic gland polyps of stomach, benign 2010  . GERD (gastroesophageal reflux disease)   . History of elevated PSA   . History of kidney stones   . Hyperlipidemia   . Hypertension   . Nocturia   . Perennial allergic rhinitis   . Peyronie's disease   . Skin cancer    Basal and squamous  cell cancers, greater than 20  . Stroke ([place]) 2016  . Tubular adenoma of colon 2010    Past Surgical History:  Procedure Laterality Date  . CORONARY ANGIOPLASTY WITH STENT PLACEMENT  02/06/2012   OM2  bare metal   . EPIDURAL BLOCK INJECTION  04-2015   Dr Brien Few  . epidural steroids  2007, 2009   X 2 @ cervical &, lumbar)  . HERNIA REPAIR    . INTRAVASCULAR ULTRASOUND  02/06/2012   Procedure: INTRAVASCULAR ULTRASOUND;  Surgeon: [name], MD;  Location: [facility] CATH LAB;  Service: Cardiovascular;;  . LEFT HEART CATHETERIZATION WITH CORONARY ANGIOGRAM N/A 02/06/2012   Procedure: LEFT HEART CATHETERIZATION WITH CORONARY ANGIOGRAM;  Surgeon: [name], MD;  Location[facility] CATH LAB;  Service: Cardiovascular;  Laterality: N/A;  . LUMBAR LAMINECTOMY/DECOMPRESSION MICRODISCECTOMY N/A 10/21/2017   Procedure: Lumbar Two-Three, Lumbar Four-Five Discectomy;  Surgeon: Ashok Pall, MD;  Location: [place];  Service: Neurosurgery;  Laterality: N/A;  . PERCUTANEOUS CORONARY STENT INTERVENTION (PCI-S)  02/06/2012   Procedure: PERCUTANEOUS CORONARY STENT INTERVENTION (PCI-S);  Surgeon: [name], MD;  Location: [facility] CATH LAB;  Service: Cardiovascular;;  . septoplasty    . TONSILLECTOMY AND ADENOIDECTOMY    . TRIGGER FINGER RELEASE    . UPPER GASTROINTESTINAL ENDOSCOPY  2011   gastric polyps  . VASECTOMY      Social History   Socioeconomic History  . Marital status: Married  Spouse name: Not on file  . Number of children: 3  . Years of education: Not on file  . Highest education level: Not on file  Occupational History  . Occupation: Higher education careers adviser   Social Needs  . Financial resource strain: Not on file  . Food insecurity:    Worry: Not on file    Inability: Not on file  . Transportation needs:    Medical: Not on file    Non-medical: Not on file  Tobacco Use  . Smoking status: Never Smoker  . Smokeless tobacco: Never Used  Substance and Sexual  Activity  . Alcohol use: Yes    Alcohol/week: 0.0 standard drinks    Comment: Wine occasionally  . Drug use: No  . Sexual activity: Yes    Partners: Female  Lifestyle  . Physical activity:    Days per week: Not on file    Minutes per session: Not on file  . Stress: Not on file  Relationships  . Social connections:    Talks on phone: Not on file    Gets together: Not on file    Attends religious service: Not on file    Active member of club or organization: Not on file    Attends meetings of clubs or organizations: Not on file    Relationship status: Not on file  . Intimate partner violence:    Fear of current or ex partner: Not on file    Emotionally abused: Not on file    Physically abused: Not on file    Forced sexual activity: Not on file  Other Topics Concern  . Not on file  Social History Narrative   3 daughters, 48 g-children   lifes w/ wife in [place] as of 04/27/2018      Reactions   Aspirin Swelling   Angioedema in 1980s Medi Alert bracelet recommended   Nifedipine Dermatitis, Rash   edema      Medication List       Accurate as of April 27, 2018  9:09 PM. Always use your most recent med list.        albuterol 108 (90 Base) MCG/ACT inhaler Commonly known as:  PROVENTIL HFA;VENTOLIN HFA Inhale 2 puffs into the lungs every 6 (six) hours as needed for wheezing or shortness of breath.   azelastine 0.1 % nasal spray Commonly known as:  ASTELIN Place 2 sprays into both nostrils at bedtime as needed for rhinitis or allergies.   cholecalciferol 1000 units tablet Commonly known as:  VITAMIN D Take 1,000 Units by mouth daily.   clopidogrel 75 MG tablet Commonly known as:  PLAVIX TAKE 1 TABLET BY MOUTH EVERY DAY   famotidine 20 MG tablet Commonly known as:  PEPCID TAKE 1 TABLET BY MOUTH EVERY DAY   Fish Oil 1200 MG Caps Take 1,200 mg by mouth daily.   FLOVENT HFA 110 MCG/ACT inhaler Generic drug:  fluticasone ONE PUFF TWICE A  DAY TO PREVENT COUGHING AND WHEEZING. RINSE, GARGLE AND SPIT OUT AFTER USE   fluticasone 50 MCG/ACT nasal spray Commonly known as:  FLONASE ONE SPRAY EACH NOSTRIL TWICE A DAY IF NEEDED FOR STUFFY NOSE.   hydrocortisone 2.5 % cream Apply 1 application topically 2 (two) times daily.   ketoconazole 2 % cream Commonly known as:  NIZORAL Apply 1 application topically daily.   metoprolol tartrate 25 MG tablet Commonly known as:  LOPRESSOR Take 0.5 tablets (12.5 mg total) by mouth 2 (  two) times daily.   montelukast 10 MG tablet Commonly known as:  SINGULAIR ONE TABLET ONCE A DAY FOR COUGHING OR WHEEZING   multivitamin with minerals Tabs tablet Take 1 tablet by mouth daily.   olmesartan-hydrochlorothiazide 20-12.5 MG tablet Commonly known as:  BENICAR HCT Take 1 tablet by mouth daily.   rosuvastatin 40 MG tablet Commonly known as:  CRESTOR Take 1 tablet (40 mg total) by mouth every evening.   tadalafil 5 MG tablet Commonly known as:  CIALIS Take 5 mg by mouth daily as needed for erectile dysfunction.   tamsulosin 0.4 MG Caps capsule Commonly known as:  FLOMAX Take 1 capsule (0.4 mg total) by mouth daily.   triamcinolone lotion 0.1 % Commonly known as:  KENALOG Apply 1 application topically 3 (three) times daily.           Objective:   Physical Exam BP 132/76 (BP Location: Left Arm, Patient Position: Sitting, Cuff Size: Normal)   Pulse 67   Temp 97.6 F (36.4 C) (Oral)   Resp 16   Ht 5' 9.1" (1.755 m)   Wt 227 lb 4 oz (103.1 kg)   SpO2 97%   BMI 33.46 kg/m  General:   Well developed, NAD, BMI noted. HEENT:  Normocephalic . Face symmetric, atraumatic Skin: At both sides of the abdomen has patchy, dry, scaly, mildly erythematous rash.  See picture for a representative sample.  No rash at the back, specifically no "Christmas tree" type of pattern Between the scrotum and the rectum: The skin is macerated, slightly erythematous.  No blisters.  No  discharge. Neurologic:  alert & oriented X3.  Speech normal, gait appropriate for age and unassisted Psych--  Cognition and judgment appear intact.  Cooperative with normal attention span and concentration.  Behavior appropriate. No anxious or depressed appearing.   Picture of the left side of the abdomen       Assessment     Assessment  Prediabetes HTN Hyperlipidemia CV: Dr Angelena Form --CAD --Stroke, seen in a MRI Asthma- Dr Darcey Nora  MSK --DJD knees used to see Dr Len Childs 2016, local injections --Spinal stenosis (neck-back) - Dr Cyndy Freeze dx ~ 2008 via MRI neck, back, had local injections 2008; then 04-2015 GERD Benign positional vertigo-- did PT before GU: --Elevated PSA -- Dr Jeffie Pollock --Peyronie's  Disease Skin cancer : [facility] Dr Susie Cassette   PLAN: Fungal infection: Dermatitis near the scrotum likely a fungal infection, recommend ketoconazole/steroids.  He has a hydrocortisone 2.5% prescribed by dermatology for eczema.  RF sent.    Eczema: Rash at the abdomen likely eczema, recommend Kenalog twice a day and moist the area with Aveeno.  Call if not better. Increased PSA: Patient reports that his PSA has decreased and urology is not planning to proceed w/ a Bx for now.  He takes Flomax 1 daily with complete resolution of symptoms.  I doubt Flomax is the culprit of his rash but if he likes to, okay to wean off of it. Follow-up already scheduled for April

## 2018-04-27 NOTE — Progress Notes (Signed)
Pre visit review using our clinic review tool, if applicable. No additional management support is needed unless otherwise documented below in the visit note. 

## 2018-06-24 ENCOUNTER — Other Ambulatory Visit: Payer: Self-pay | Admitting: Pediatrics

## 2018-06-27 ENCOUNTER — Other Ambulatory Visit: Payer: Self-pay | Admitting: Pediatrics

## 2018-06-28 ENCOUNTER — Other Ambulatory Visit: Payer: Self-pay | Admitting: Pediatrics

## 2018-07-01 ENCOUNTER — Other Ambulatory Visit: Payer: Self-pay | Admitting: Pediatrics

## 2018-07-19 ENCOUNTER — Encounter: Payer: PPO | Admitting: Internal Medicine

## 2018-08-11 ENCOUNTER — Encounter: Payer: Self-pay | Admitting: Internal Medicine

## 2018-08-11 DIAGNOSIS — B342 Coronavirus infection, unspecified: Secondary | ICD-10-CM

## 2018-08-12 DIAGNOSIS — Z20828 Contact with and (suspected) exposure to other viral communicable diseases: Secondary | ICD-10-CM | POA: Diagnosis not present

## 2018-08-17 ENCOUNTER — Other Ambulatory Visit: Payer: Self-pay | Admitting: Internal Medicine

## 2018-08-20 ENCOUNTER — Other Ambulatory Visit: Payer: Self-pay | Admitting: Internal Medicine

## 2018-08-24 ENCOUNTER — Other Ambulatory Visit (INDEPENDENT_AMBULATORY_CARE_PROVIDER_SITE_OTHER): Payer: PPO

## 2018-08-24 ENCOUNTER — Other Ambulatory Visit: Payer: Self-pay

## 2018-08-24 DIAGNOSIS — B342 Coronavirus infection, unspecified: Secondary | ICD-10-CM | POA: Diagnosis not present

## 2018-08-24 DIAGNOSIS — Z20828 Contact with and (suspected) exposure to other viral communicable diseases: Secondary | ICD-10-CM

## 2018-08-24 LAB — SAR COV2 SEROLOGY (COVID19)AB(IGG),IA: SARS CoV2 AB IGG: NEGATIVE

## 2018-08-25 DIAGNOSIS — L218 Other seborrheic dermatitis: Secondary | ICD-10-CM | POA: Diagnosis not present

## 2018-08-25 DIAGNOSIS — Z08 Encounter for follow-up examination after completed treatment for malignant neoplasm: Secondary | ICD-10-CM | POA: Diagnosis not present

## 2018-08-25 DIAGNOSIS — Z85828 Personal history of other malignant neoplasm of skin: Secondary | ICD-10-CM | POA: Diagnosis not present

## 2018-08-25 DIAGNOSIS — L570 Actinic keratosis: Secondary | ICD-10-CM | POA: Diagnosis not present

## 2018-09-20 ENCOUNTER — Other Ambulatory Visit: Payer: Self-pay | Admitting: Internal Medicine

## 2018-09-22 ENCOUNTER — Other Ambulatory Visit: Payer: Self-pay | Admitting: Pediatrics

## 2018-09-25 ENCOUNTER — Other Ambulatory Visit: Payer: Self-pay | Admitting: Pediatrics

## 2018-10-24 ENCOUNTER — Other Ambulatory Visit: Payer: Self-pay | Admitting: Pediatrics

## 2018-11-04 DIAGNOSIS — E119 Type 2 diabetes mellitus without complications: Secondary | ICD-10-CM | POA: Diagnosis not present

## 2018-11-04 LAB — HEMOGLOBIN A1C: Hemoglobin A1C: 5.8

## 2018-11-05 ENCOUNTER — Other Ambulatory Visit: Payer: Self-pay

## 2018-11-07 ENCOUNTER — Other Ambulatory Visit: Payer: Self-pay | Admitting: Internal Medicine

## 2018-11-13 ENCOUNTER — Other Ambulatory Visit: Payer: Self-pay | Admitting: Internal Medicine

## 2018-11-16 ENCOUNTER — Encounter: Payer: Self-pay | Admitting: Internal Medicine

## 2018-11-22 ENCOUNTER — Other Ambulatory Visit: Payer: Self-pay

## 2018-11-22 ENCOUNTER — Ambulatory Visit (INDEPENDENT_AMBULATORY_CARE_PROVIDER_SITE_OTHER): Payer: PPO | Admitting: Internal Medicine

## 2018-11-22 ENCOUNTER — Encounter: Payer: Self-pay | Admitting: Internal Medicine

## 2018-11-22 VITALS — BP 122/57 | HR 60 | Temp 97.6°F | Resp 16 | Ht 69.1 in | Wt 216.5 lb

## 2018-11-22 DIAGNOSIS — E785 Hyperlipidemia, unspecified: Secondary | ICD-10-CM | POA: Diagnosis not present

## 2018-11-22 DIAGNOSIS — R220 Localized swelling, mass and lump, head: Secondary | ICD-10-CM

## 2018-11-22 DIAGNOSIS — L089 Local infection of the skin and subcutaneous tissue, unspecified: Secondary | ICD-10-CM | POA: Diagnosis not present

## 2018-11-22 DIAGNOSIS — I10 Essential (primary) hypertension: Secondary | ICD-10-CM

## 2018-11-22 DIAGNOSIS — R739 Hyperglycemia, unspecified: Secondary | ICD-10-CM | POA: Diagnosis not present

## 2018-11-22 LAB — CBC WITH DIFFERENTIAL/PLATELET
Basophils Absolute: 0 10*3/uL (ref 0.0–0.1)
Basophils Relative: 0.7 % (ref 0.0–3.0)
Eosinophils Absolute: 0.1 10*3/uL (ref 0.0–0.7)
Eosinophils Relative: 1.8 % (ref 0.0–5.0)
HCT: 43.1 % (ref 39.0–52.0)
Hemoglobin: 14.9 g/dL (ref 13.0–17.0)
Lymphocytes Relative: 28.1 % (ref 12.0–46.0)
Lymphs Abs: 1.7 10*3/uL (ref 0.7–4.0)
MCHC: 34.7 g/dL (ref 30.0–36.0)
MCV: 89.9 fl (ref 78.0–100.0)
Monocytes Absolute: 0.5 10*3/uL (ref 0.1–1.0)
Monocytes Relative: 8.5 % (ref 3.0–12.0)
Neutro Abs: 3.7 10*3/uL (ref 1.4–7.7)
Neutrophils Relative %: 60.9 % (ref 43.0–77.0)
Platelets: 148 10*3/uL — ABNORMAL LOW (ref 150.0–400.0)
RBC: 4.79 Mil/uL (ref 4.22–5.81)
RDW: 12.7 % (ref 11.5–15.5)
WBC: 6 10*3/uL (ref 4.0–10.5)

## 2018-11-22 LAB — BASIC METABOLIC PANEL
BUN: 17 mg/dL (ref 6–23)
CO2: 24 mEq/L (ref 19–32)
Calcium: 9.7 mg/dL (ref 8.4–10.5)
Chloride: 105 mEq/L (ref 96–112)
Creatinine, Ser: 1.03 mg/dL (ref 0.40–1.50)
GFR: 71.24 mL/min (ref >60.00)
Glucose, Bld: 96 mg/dL (ref 70–99)
Potassium: 4 mEq/L (ref 3.5–5.1)
Sodium: 140 mEq/L (ref 135–145)

## 2018-11-22 MED ORDER — DOXYCYCLINE HYCLATE 100 MG PO TABS: 100.0000 mg | ORAL_TABLET | Freq: Two times a day (BID) | ORAL | 0 refills | Status: DC

## 2018-11-22 MED ORDER — MUPIROCIN 2 % EX OINT: 1.0000 "application " | TOPICAL_OINTMENT | Freq: Two times a day (BID) | CUTANEOUS | 0 refills | Status: DC

## 2018-11-22 NOTE — Progress Notes (Signed)
Subjective:    Patient ID: George Alvarez, male    DOB: [birth year], 73 y.o.   MRN: [id number]  DOS:  11/22/2018 Type of visit - description: Acute, several concerns Noted a bump behind the left ear few days ago, since then it become tender and slightly larger.  No openings or discharge. Also, having pustules throughout the abdomen and chest on and off, this is going on for a while HTN: Good med compliance, ambulatory BPs in the 130s. Also, since the last visit he saw dermatology, multiple creams prescribed    Review of Systems No fever chills No nausea, vomiting, diarrhea No chest pain no difficulty breathing Past Medical History:  Diagnosis Date  . Anal fissure   . Arthritis   . Asthma, chronic 06/16/2007   Qualifier: Diagnosis of  By: Linna Darner MD, Gwyndolyn Saxon   Onset:as child Triggers (environmental, infectious, allergic): all, mainly environmental triggers Rescue inhaler VPX:TGGYIR Maintenance medications/ response:Singulair,Qvar Smoking history:never Family history pulmonary disease: no    . Basal cell carcinoma of chest wall 2016   1.8 cm, treated with electrodessication and currettage  . Benign paroxysmal positional vertigo 11/19/2012   Diagnosed at [facility], [facility]  Physical therapy appointment pending   . BPH (benign prostatic hyperplasia)    With urinary obstruction  . Coronary artery disease    a. 02/2012 Cath/PCI: LM 10, LAD min irregs, LCX large, OM1 sm, 40 ost, OM2 95p (5.0x16 Veriflex & 4.5x12 Veriflex BMS'), RCA 30p, 38m, 30d, PDA/PLA min irregs, EF 65%  . Diverticulosis   . Erectile dysfunction due to arterial insufficiency   . Fundic gland polyps of stomach, benign 2010  . GERD (gastroesophageal reflux disease)   . History of elevated PSA   . History of kidney stones   . Hyperlipidemia   . Hypertension   . Nocturia   . Perennial allergic rhinitis   . Peyronie's disease   . Skin cancer    Basal and squamous cell cancers, greater than 20  . Stroke  ([place]) 2016  . Tubular adenoma of colon 2010    Past Surgical History:  Procedure Laterality Date  . CORONARY ANGIOPLASTY WITH STENT PLACEMENT  02/06/2012   OM2  bare metal   . EPIDURAL BLOCK INJECTION  04-2015   Dr Brien Few  . epidural steroids  2007, 2009   X 2 @ cervical &, lumbar)  . HERNIA REPAIR    . INTRAVASCULAR ULTRASOUND  02/06/2012   Procedure: INTRAVASCULAR ULTRASOUND;  Surgeon: [name], MD;  Location: [facility] CATH LAB;  Service: Cardiovascular;;  . LEFT HEART CATHETERIZATION WITH CORONARY ANGIOGRAM N/A 02/06/2012   Procedure: LEFT HEART CATHETERIZATION WITH CORONARY ANGIOGRAM;  Surgeon: [name], MD;  Location: [facility] CATH LAB;  Service: Cardiovascular;  Laterality: N/A;  . LUMBAR LAMINECTOMY/DECOMPRESSION MICRODISCECTOMY N/A 10/21/2017   Procedure: Lumbar Two-Three, Lumbar Four-Five Discectomy;  Surgeon: Ashok Pall, MD;  Location: [place];  Service: Neurosurgery;  Laterality: N/A;  . PERCUTANEOUS CORONARY STENT INTERVENTION (PCI-S)  02/06/2012   Procedure: PERCUTANEOUS CORONARY STENT INTERVENTION (PCI-S);  Surgeon: [name], MD;  Location: [facility] CATH LAB;  Service: Cardiovascular;;  . septoplasty    . TONSILLECTOMY AND ADENOIDECTOMY    . TRIGGER FINGER RELEASE    . UPPER GASTROINTESTINAL ENDOSCOPY  2011   gastric polyps  . VASECTOMY      Social History   Socioeconomic History  . Marital status: Married    Spouse name: Not on file  . Number of children: 3  .  Years of education: Not on file  . Highest education level: Not on file  Occupational History  . Occupation: Higher education careers adviser   Social Needs  . Financial resource strain: Not on file  . Food insecurity    Worry: Not on file    Inability: Not on file  . Transportation needs    Medical: Not on file    Non-medical: Not on file  Tobacco Use  . Smoking status: Never Smoker  . Smokeless tobacco: Never Used  Substance and Sexual Activity  . Alcohol use: Yes     Alcohol/week: 0.0 standard drinks    Comment: Wine occasionally  . Drug use: No  . Sexual activity: Yes    Partners: Female  Lifestyle  . Physical activity    Days per week: Not on file    Minutes per session: Not on file  . Stress: Not on file  Relationships  . Social Herbalist on phone: Not on file    Gets together: Not on file    Attends religious service: Not on file    Active member of club or organization: Not on file    Attends meetings of clubs or organizations: Not on file    Relationship status: Not on file  . Intimate partner violence    Fear of current or ex partner: Not on file    Emotionally abused: Not on file    Physically abused: Not on file    Forced sexual activity: Not on file  Other Topics Concern  . Not on file  Social History Narrative   3 daughters, 5 g-children   lifes w/ wife in [place] as of 11/22/2018      Reactions   Aspirin Swelling   Angioedema in 1980s Medi Alert bracelet recommended   Nifedipine Dermatitis, Rash   edema      Medication List       Accurate as of November 22, 2018 11:59 PM. If you have any questions, ask your nurse or doctor.        STOP taking these medications   ketoconazole 2 % cream Commonly known as: NIZORAL Stopped by: Kathlene November, MD   triamcinolone lotion 0.1 % Commonly known as: KENALOG Stopped by: Kathlene November, MD     TAKE these medications   albuterol 108 (90 Base) MCG/ACT inhaler Commonly known as: VENTOLIN HFA Inhale 2 puffs into the lungs every 6 (six) hours as needed for wheezing or shortness of breath.   azelastine 0.1 % nasal spray Commonly known as: ASTELIN Place 2 sprays into both nostrils at bedtime as needed for rhinitis or allergies.   cholecalciferol 1000 units tablet Commonly known as: VITAMIN D Take 1,000 Units by mouth daily.   clopidogrel 75 MG tablet Commonly known as: PLAVIX TAKE 1 TABLET BY MOUTH EVERY DAY   doxycycline 100 MG tablet Commonly  known as: VIBRA-TABS Take 1 tablet (100 mg total) by mouth 2 (two) times daily. Started by: Kathlene November, MD   famotidine 20 MG tablet Commonly known as: PEPCID TAKE 1 TABLET BY MOUTH EVERY DAY   Fish Oil 1200 MG Caps Take 1,200 mg by mouth daily.   Flovent HFA 110 MCG/ACT inhaler Generic drug: fluticasone ONE PUFF TWICE A DAY TO PREVENT COUGHING AND WHEEZING. RINSE, GARGLE AND SPIT OUT AFTER USE   fluticasone 50 MCG/ACT nasal spray Commonly known as: FLONASE ONE SPRAY EACH NOSTRIL TWICE A DAY IF NEEDED FOR STUFFY NOSE.  hydrocortisone 2.5 % cream Apply 1 application topically 2 (two) times daily.   metoprolol tartrate 25 MG tablet Commonly known as: LOPRESSOR Take 0.5 tablets (12.5 mg total) by mouth 2 (two) times daily.   montelukast 10 MG tablet Commonly known as: SINGULAIR TAKE 1 TABLET BY MOUTH EVERYDAY AT BEDTIME   multivitamin with minerals Tabs tablet Take 1 tablet by mouth daily.   mupirocin ointment 2 % Commonly known as: Bactroban Place 1 application into the nose 2 (two) times daily. Started by: Kathlene November, MD   olmesartan-hydrochlorothiazide 20-12.5 MG tablet Commonly known as: BENICAR HCT Take 1 tablet by mouth daily.   rosuvastatin 40 MG tablet Commonly known as: CRESTOR Take 1 tablet (40 mg total) by mouth every evening.   tadalafil 5 MG tablet Commonly known as: CIALIS Take 5 mg by mouth daily as needed for erectile dysfunction.   tamsulosin 0.4 MG Caps capsule Commonly known as: FLOMAX Take 1 capsule (0.4 mg total) by mouth daily.           Objective:   Physical Exam HENT:     Head:     BP (!) 122/57 (BP Location: Left Arm, Patient Position: Sitting, Cuff Size: Normal)   Pulse 60   Temp 97.6 F (36.4 C) (Temporal)   Resp 16   Ht 5' 9.1" (1.755 m)   Wt 216 lb 8 oz (98.2 kg)   SpO2 97%   BMI 31.88 kg/m   General:   Well developed, NAD, BMI noted. HEENT:  Normocephalic . Face symmetric, atraumatic Lungs:  CTA B Normal  respiratory effort, no intercostal retractions, no accessory muscle use. Heart: RRR,  no murmur.  No pretibial edema bilaterally  Skin: At the anterior chest and upper abdomen he has 2 to 3 pustules around 2-3  mm Neurologic:  alert & oriented X3.  Speech normal, gait appropriate for age and unassisted Psych--  Cognition and judgment appear intact.  Cooperative with normal attention span and concentration.  Behavior appropriate. No anxious or depressed appearing.      Assessment     Assessment  Prediabetes HTN Hyperlipidemia CV: Dr Angelena Form --CAD --Stroke, seen in a MRI Asthma- Dr Darcey Nora  MSK --DJD knees used to see Dr Len Childs 2016, local injections --Spinal stenosis (neck-back) - Dr Cyndy Freeze dx ~ 2008 via MRI neck, back, had local injections 2008; then 04-2015 GERD Benign positional vertigo-- did PT before GU: --Elevated PSA -- Dr Jeffie Pollock --Peyronie's  Disease Skin cancer : [facility] Dr Susie Cassette   PLAN: Lump, behind the left ear: Possibly infected sebaceous cyst versus LAD.  Recommend a warm compress and doxycycline Pustules: On and off at the chest and abdomen, a culture was taken from 2 of the areas, got a very small amount of pus, culture probably will be negative.  This could be MRSA, will Rx Bactroban nasal for 1 week. Prediabetes: Last A1c 5.8 HTN: Check a BMP, CBC continue metoprolol, Benicar HCT. High cholesterol: Controlled, continue Crestor Eczema: Since the last visit, Kenalog worked.  He has also seen his dermatologist and she has prescribed a number of different creams. Preventive care: Recommend a high-dose flu shot this fall.  Also patient reports he had the first Shingrix dose. RTC 4 months CPX

## 2018-11-22 NOTE — Progress Notes (Signed)
Pre visit review using our clinic review tool, if applicable. No additional management support is needed unless otherwise documented below in the visit note. 

## 2018-11-22 NOTE — Patient Instructions (Signed)
GO TO THE LAB : Get the blood work     GO TO THE FRONT DESK Schedule your next appointment   for a physical exam in 3 to 4 months from today.  High-dose flu shot this fall  For 1 week: Doxycycline by mouth Mupirocin ointment to your nose twice a day Warm compress to the lab and at the left side of your neck If not gradually improving let me know.

## 2018-11-23 NOTE — Assessment & Plan Note (Signed)
Lump, behind the left ear: Possibly infected sebaceous cyst versus LAD.  Recommend a warm compress and doxycycline Pustules: On and off at the chest and abdomen, a culture was taken from 2 of the areas, got a very small amount of pus, culture probably will be negative.  This could be MRSA, will Rx Bactroban nasal for 1 week. Prediabetes: Last A1c 5.8 HTN: Check a BMP, CBC continue metoprolol, Benicar HCT. High cholesterol: Controlled, continue Crestor Eczema: Since the last visit, Kenalog worked.  He has also seen his dermatologist and she has prescribed a number of different creams. Preventive care: Recommend a high-dose flu shot this fall.  Also patient reports he had the first Shingrix dose. RTC 4 months CPX

## 2018-11-25 LAB — WOUND CULTURE
MICRO NUMBER:: 778476
SPECIMEN QUALITY:: ADEQUATE

## 2018-11-28 ENCOUNTER — Encounter: Payer: Self-pay | Admitting: Internal Medicine

## 2018-12-01 ENCOUNTER — Other Ambulatory Visit: Payer: Self-pay | Admitting: Internal Medicine

## 2018-12-01 MED ORDER — OLMESARTAN MEDOXOMIL-HCTZ 20-12.5 MG PO TABS: 1.0000 | ORAL_TABLET | Freq: Every day | ORAL | 1 refills | Status: DC

## 2018-12-16 ENCOUNTER — Other Ambulatory Visit: Payer: Self-pay | Admitting: Pediatrics

## 2018-12-16 ENCOUNTER — Other Ambulatory Visit: Payer: Self-pay | Admitting: *Deleted

## 2018-12-16 MED ORDER — MONTELUKAST SODIUM 10 MG PO TABS: ORAL_TABLET | ORAL | 0 refills | Status: DC

## 2018-12-20 ENCOUNTER — Other Ambulatory Visit: Payer: Self-pay

## 2018-12-20 ENCOUNTER — Ambulatory Visit (INDEPENDENT_AMBULATORY_CARE_PROVIDER_SITE_OTHER): Payer: PPO | Admitting: Pediatrics

## 2018-12-20 ENCOUNTER — Encounter: Payer: Self-pay | Admitting: Pediatrics

## 2018-12-20 VITALS — BP 104/70 | HR 60 | Temp 98.3°F | Resp 12 | Ht 70.0 in | Wt 214.8 lb

## 2018-12-20 DIAGNOSIS — Z79899 Other long term (current) drug therapy: Secondary | ICD-10-CM

## 2018-12-20 DIAGNOSIS — J3089 Other allergic rhinitis: Secondary | ICD-10-CM | POA: Diagnosis not present

## 2018-12-20 DIAGNOSIS — I10 Essential (primary) hypertension: Secondary | ICD-10-CM | POA: Diagnosis not present

## 2018-12-20 DIAGNOSIS — K219 Gastro-esophageal reflux disease without esophagitis: Secondary | ICD-10-CM | POA: Diagnosis not present

## 2018-12-20 DIAGNOSIS — J4540 Moderate persistent asthma, uncomplicated: Secondary | ICD-10-CM | POA: Diagnosis not present

## 2018-12-20 MED ORDER — MONTELUKAST SODIUM 10 MG PO TABS: ORAL_TABLET | ORAL | 1 refills | Status: DC

## 2018-12-20 NOTE — Progress Notes (Signed)
[address] [place] [address] Dept: [phone]  FOLLOW UP NOTE  Patient ID: George Alvarez, male    DOB: [birth year]  Age: 73 y.o. MRN: [id number] Date of Office Visit: 12/20/2018  Assessment  Chief Complaint: Allergic Rhinitis  (doing good) and Asthma (doing good)  HPI [name] presents for follow-up of asthma and allergic rhinitis.  His asthma is well controlled with the use of Flovent 110-2 puffs once a day and montelukast 10 mg once a day.  He has not had to use the Ventolin inhaler for several months.  Dr. Larose Kells noted an excoriation in the left nostril a few weeks ago and  he prescribed Bactroban ointment twice a day for 2 weeks.  His symptoms cleared up and then they came back.  He has been using fluticasone 1 spray per nostril twice a day if needed and azelastine 0.1% - 2 sprays per nostril at night if needed.  A few weeks ago he developed an itchy rash on his arms and the back of his neck.  He has seen a dermatologist and is using triamcinolone cream.  His symptoms improved but then they come back after he stops  using triamcinolone  His blood pressure has been well controlled.  He is not having gastroesophageal reflux.   Drug Allergies:  Allergies  Allergen Reactions  . Aspirin Swelling    Angioedema in 1980s Medi Alert bracelet recommended  . Nifedipine Dermatitis and Rash    edema    Physical Exam: BP 104/70 (BP Location: Right Arm, Patient Position: Sitting, Cuff Size: Normal)   Pulse 60   Temp 98.3 F (36.8 C) (Oral)   Resp 12   Ht 5\' 10"  (1.778 m)   Wt 214 lb 12.8 oz (97.4 kg)   SpO2 96%   BMI 30.82 kg/m    Physical Exam Constitutional:      Appearance: He is obese.  HENT:     Head:     Comments: Eyes normal.  Ears normal.  Nose mild swelling of the nasal turbinates.  There was a pinpoint excoriation in the left nostril laterally.  Pharynx normal. Neck:     Musculoskeletal: Neck supple.  Cardiovascular:     Rate and Rhythm: Normal  rate and regular rhythm.     Comments: S1-S2 normal no murmurs Pulmonary:     Comments: Clear to percussion and auscultation Lymphadenopathy:     Cervical: No cervical adenopathy.  Neurological:     General: No focal deficit present.     Mental Status: He is alert and oriented to person, place, and time. Mental status is at baseline.  Psychiatric:        Mood and Affect: Mood normal.        Behavior: Behavior normal.        Thought Content: Thought content normal.        Judgment: Judgment normal.     Diagnostics: FVC 2.99 L FEV1 2.28 L.  Predicted FVC 4.44 L predicted FEV1 3.27 L-this shows a minimal reduction in the forced vital capacity  Assessment and Plan: 1. Moderate persistent asthma without complication   2. Other allergic rhinitis   3. Current use of beta blocker   4. Essential hypertension   5. Gastroesophageal reflux disease without esophagitis   6.  Small excoriation left nostril  Meds ordered this encounter  Medications  . montelukast (SINGULAIR) 10 MG tablet    Sig: TAKE 1 TABLET BY MOUTH EVERYDAY AT BEDTIME  Dispense:  90 tablet    Refill:  1    Dispense 90 day supply    Patient Instructions  Zyrtec 10 mg-take 1 tablet once a day if needed for runny nose or itchy eyes.  Stop 3 days before allergy testing Bactroban ointment twice a day in each nostril for 1 week Next week you may use fluticasone 1 spray per nostril twice a day if needed for stuffy nose  Flovent 110-2 puffs once a day to prevent coughing or wheezing but increase it to 2 puffs twice a day if the asthma is not well controlled Montelukast 10 mg-take 1 tablet once a day to prevent coughing or wheezing Use Ventolin 2 puffs every 4 hours if needed for wheezing or coughing spells.  You may use Ventolin 2 puffs 5 to 15 minutes before exercise You should have a flu vaccination  Continue on your other medications Follow-up to do allergy skin testing to evaluate your skin rash   Return in about  6 months (around 06/19/2019).    Thank you for the opportunity to care for this patient.  Please do not hesitate to contact me with questions.  Penne Lash, M.D.  [facility] [address] [place], [place] [address] [phone]

## 2018-12-20 NOTE — Patient Instructions (Signed)
Zyrtec 10 mg-take 1 tablet once a day if needed for runny nose or itchy eyes.  Stop 3 days before allergy testing Bactroban ointment twice a day in each nostril for 1 week Next week you may use fluticasone 1 spray per nostril twice a day if needed for stuffy nose  Flovent 110-2 puffs once a day to prevent coughing or wheezing but increase it to 2 puffs twice a day if the asthma is not well controlled Montelukast 10 mg-take 1 tablet once a day to prevent coughing or wheezing Use Ventolin 2 puffs every 4 hours if needed for wheezing or coughing spells.  You may use Ventolin 2 puffs 5 to 15 minutes before exercise You should have a flu vaccination  Continue on your other medications Follow-up to do allergy skin testing to evaluate your skin rash

## 2018-12-27 ENCOUNTER — Encounter: Payer: Self-pay | Admitting: Internal Medicine

## 2018-12-30 ENCOUNTER — Telehealth: Payer: Self-pay | Admitting: Pediatrics

## 2018-12-30 NOTE — Telephone Encounter (Signed)
PT called to confirm what meds he is not supposed to take for allergy testing. Advise stop zyrtec for 3 days.

## 2019-01-03 ENCOUNTER — Ambulatory Visit (INDEPENDENT_AMBULATORY_CARE_PROVIDER_SITE_OTHER): Payer: PPO | Admitting: Pediatrics

## 2019-01-03 ENCOUNTER — Encounter: Payer: Self-pay | Admitting: Pediatrics

## 2019-01-03 ENCOUNTER — Other Ambulatory Visit: Payer: Self-pay

## 2019-01-03 VITALS — BP 96/60 | HR 64 | Temp 98.1°F | Resp 16

## 2019-01-03 DIAGNOSIS — I10 Essential (primary) hypertension: Secondary | ICD-10-CM

## 2019-01-03 DIAGNOSIS — Z79899 Other long term (current) drug therapy: Secondary | ICD-10-CM | POA: Diagnosis not present

## 2019-01-03 DIAGNOSIS — J3089 Other allergic rhinitis: Secondary | ICD-10-CM | POA: Diagnosis not present

## 2019-01-03 DIAGNOSIS — J4540 Moderate persistent asthma, uncomplicated: Secondary | ICD-10-CM | POA: Diagnosis not present

## 2019-01-03 DIAGNOSIS — L2089 Other atopic dermatitis: Secondary | ICD-10-CM | POA: Diagnosis not present

## 2019-01-03 DIAGNOSIS — K219 Gastro-esophageal reflux disease without esophagitis: Secondary | ICD-10-CM

## 2019-01-03 NOTE — Patient Instructions (Signed)
Environmental control of dust mite Zyrtec 10 mg-take 1 tablet once a day for runny nose or itching.  Bactroban ointment twice a day in the left nostril for 1 week.  Next week you may use fluticasone 1 spray per nostril twice a day if needed for a stuffy nose  Flovent 110-2 puffs once a day to prevent coughing or wheezing but increase to 2 puffs twice a day if the asthma is not well controlled. Montelukast 10 mg-take 1 tablet once a day to prevent coughing or wheezing Ventolin 2 puffs every 4 hours if needed for wheezing or  coughing spells.  You may use Ventolin 2 puffs 5 to 15 minutes before exercise  Triamcinolone 0.1% cream twice a day if needed the red itchy areas below the face

## 2019-01-03 NOTE — Progress Notes (Signed)
[address] [place] [address] Dept: [phone]  FOLLOW UP NOTE  Patient ID: George Alvarez, male    DOB: 1946-03-24  Age: 73 y.o. MRN: [id number] Date of Office Visit: 01/03/2019  Assessment  Chief Complaint: Allergy Testing  HPI [facility] presents for for allergy skin testing.  He is very concerned about his atopic dermatitis.  He is concerned that some foods may be contributing to his atopic dermatitis.  His asthma continues to be well controlled.  He is used Bactroban twice a day for 1 week and then stopped  the Bactroban  ointment in his nose..   Drug Allergies:  Allergies  Allergen Reactions  . Aspirin Swelling    Angioedema in 1980s Medi Alert bracelet recommended  . Nifedipine Dermatitis and Rash    edema    Physical Exam: BP 96/60 (BP Location: Left Arm, Patient Position: Sitting, Cuff Size: Normal)   Pulse 64   Temp 98.1 F (36.7 C) (Oral)   Resp 16   SpO2 97%    Physical Exam Constitutional:      Appearance: Normal appearance. He is normal weight.  HENT:     Head:     Comments: Eyes normal.  Ears normal.  Nose normal.  The abraded area in the left nostril laterally is much improved.  Pharynx normal. Cardiovascular:     Comments: S1-S2 normal no murmur Pulmonary:     Comments: Clear to percussion and auscultation Skin:    Comments: He had a few small papulosquamous areas  Neurological:     General: No focal deficit present.     Mental Status: He is alert and oriented to person, place, and time. Mental status is at baseline.  Psychiatric:        Mood and Affect: Mood normal.        Behavior: Behavior normal.        Thought Content: Thought content normal.        Judgment: Judgment normal.     Diagnostics: Allergy skin tests were positive to dust mite.  Skin testing to foods was negative  Assessment and Plan: 1. Other allergic rhinitis   2. Flexural atopic dermatitis   3. Moderate persistent asthma without complication   4.  Current use of beta blocker   5. Essential hypertension   6. Gastroesophageal reflux disease without esophagitis     No orders of the defined types were placed in this encounter.   Patient Instructions  Environmental control of dust mite Zyrtec 10 mg-take 1 tablet once a day for runny nose or itching.  Bactroban ointment twice a day in the left nostril for 1 week.  Next week you may use fluticasone 1 spray per nostril twice a day if needed for a stuffy nose  Flovent 110-2 puffs once a day to prevent coughing or wheezing but increase to 2 puffs twice a day if the asthma is not well controlled. Montelukast 10 mg-take 1 tablet once a day to prevent coughing or wheezing Ventolin 2 puffs every 4 hours if needed for wheezing or  coughing spells.  You may use Ventolin 2 puffs 5 to 15 minutes before exercise  Triamcinolone 0.1% cream twice a day if needed the red itchy areas below the face   Return in about 6 months (around 07/03/2019).    Thank you for the opportunity to care for this patient.  Please do not hesitate to contact me with questions.  Penne Lash, M.D.  [facility] and [facility] [address] [place], [place] [address] [phone]

## 2019-01-08 IMAGING — CR DG LUMBAR SPINE 2-3V
2 series · 2 of 2 positions shown · non-contrast
Comparison: Lumbar spine radiograph 10/13/2017

CLINICAL DATA: L2-3 and L4-L5 spinal fusion

EXAM:
LUMBAR SPINE - 2-3 VIEW

[xtable lateral (1 of 2)]
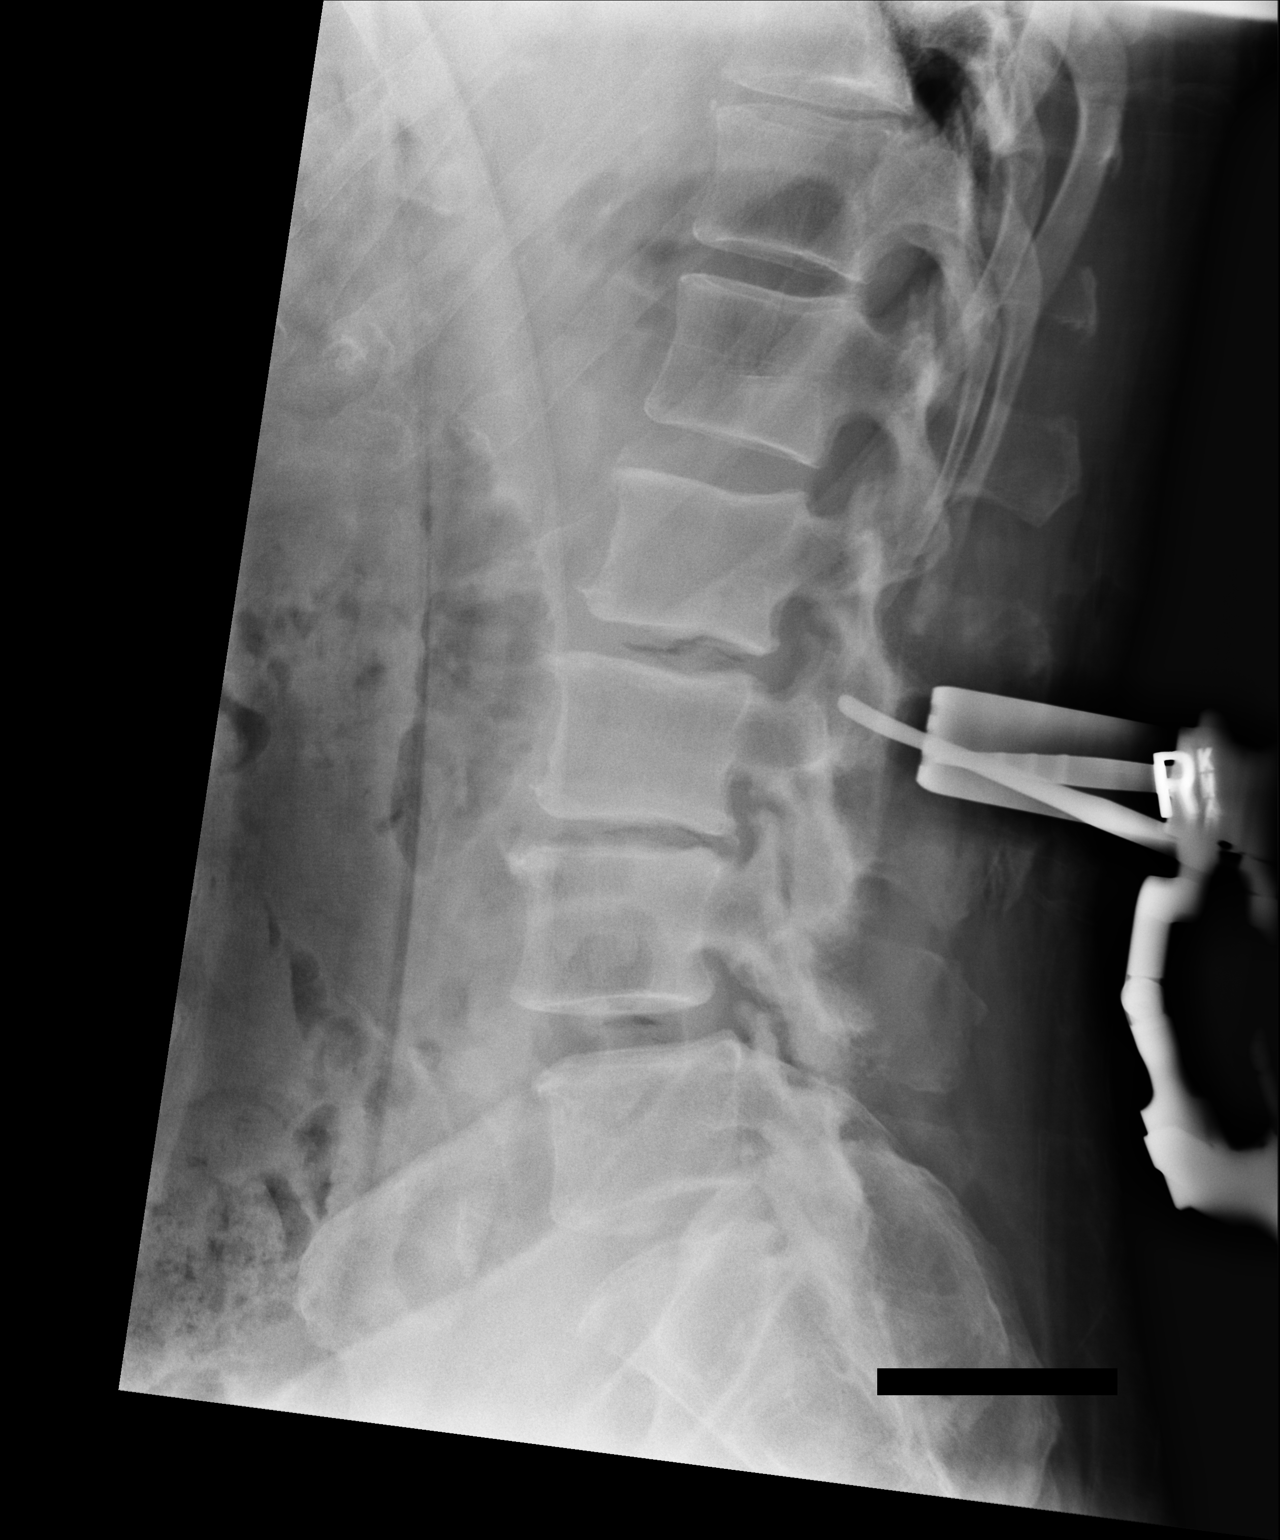

[xtable lateral (2 of 2)]
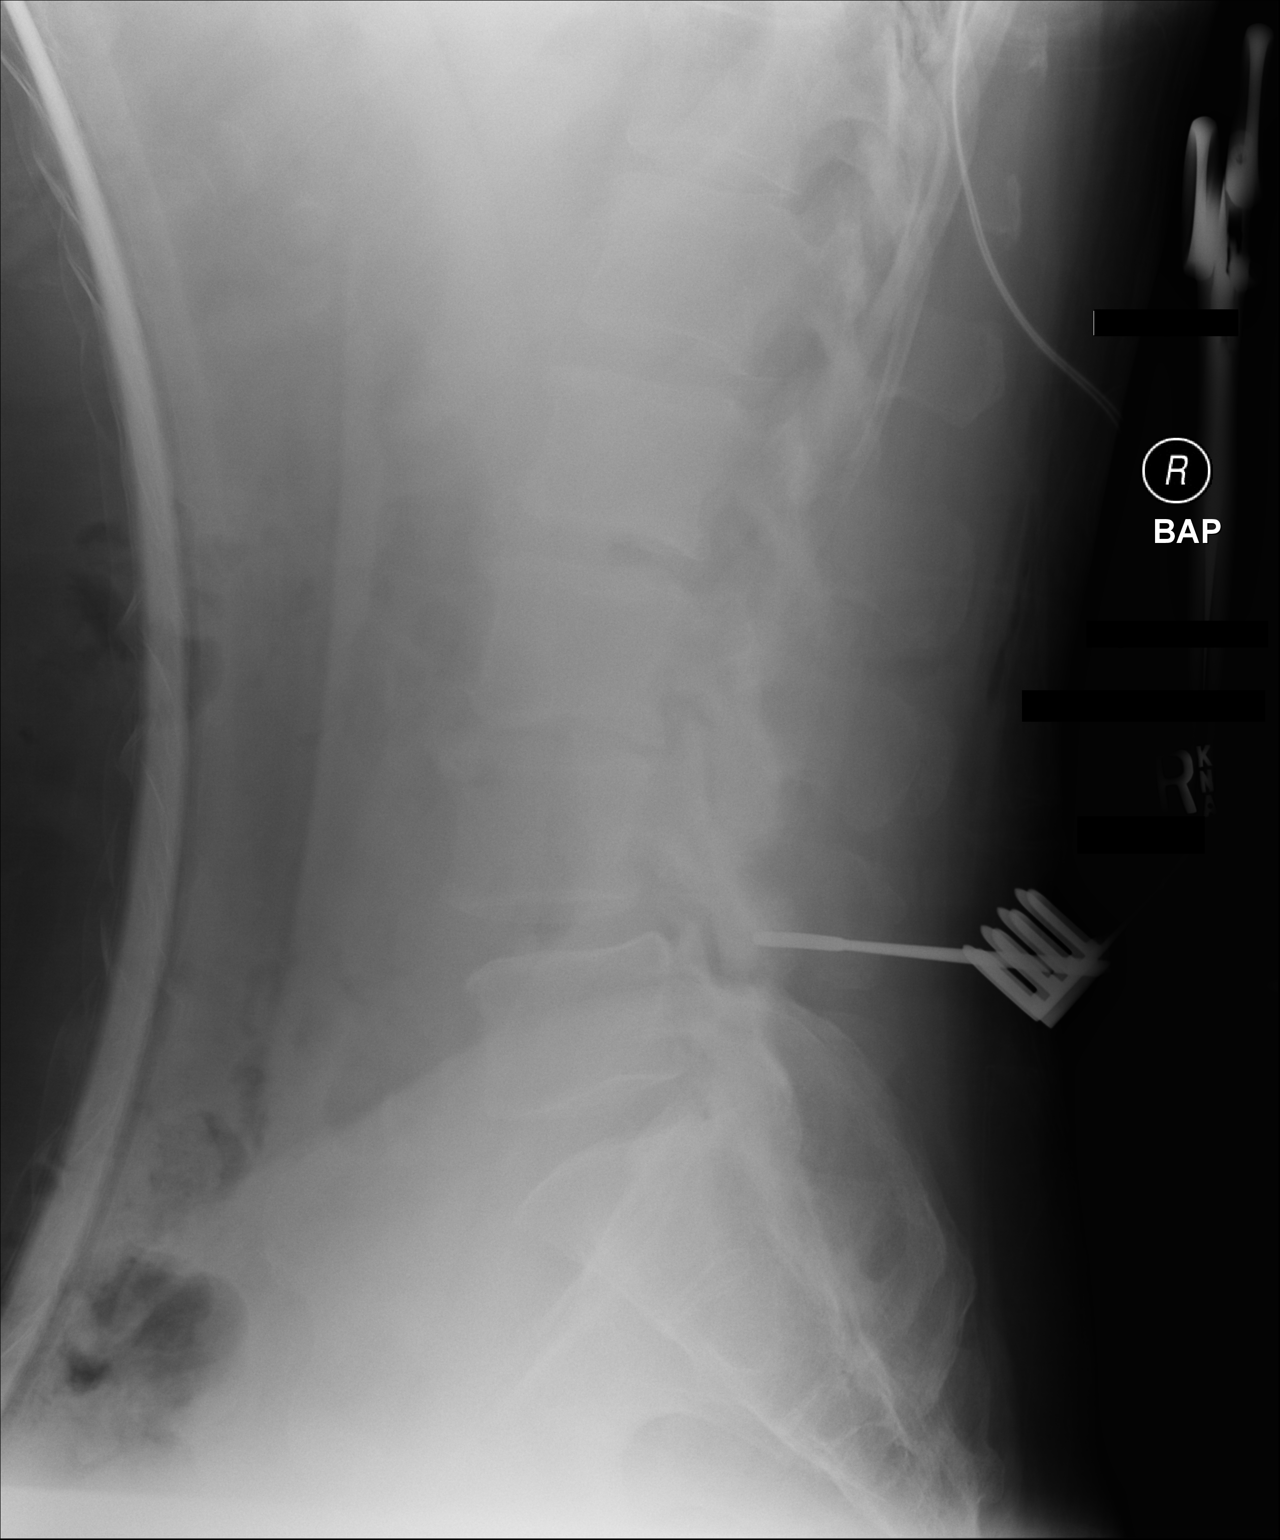

[2 of 2 positions shown; findings below may reference images not displayed]

FINDINGS: On the first image, there is a spinal needle directly posterior to
the L4 spinous process, at the level of the L4-5 disc space. On the
second image, the there is a spinal needle in line with the L2-3
disc space, directly superficial to the L2 spinous process.
IMPRESSION: Radiographic localization prior to or during lumbar spine surgery.

## 2019-02-22 ENCOUNTER — Encounter: Payer: Self-pay | Admitting: Internal Medicine

## 2019-02-22 NOTE — Telephone Encounter (Signed)
FYI. Appt has been scheduled in person for tomorrow. Just wanted you to see MyChart message prior to visit.

## 2019-02-23 ENCOUNTER — Ambulatory Visit (INDEPENDENT_AMBULATORY_CARE_PROVIDER_SITE_OTHER): Payer: PPO | Admitting: Internal Medicine

## 2019-02-23 ENCOUNTER — Other Ambulatory Visit: Payer: Self-pay

## 2019-02-23 ENCOUNTER — Encounter: Payer: Self-pay | Admitting: Internal Medicine

## 2019-02-23 VITALS — BP 127/69 | HR 58 | Temp 96.2°F | Resp 16 | Ht 70.0 in | Wt 215.5 lb

## 2019-02-23 DIAGNOSIS — K219 Gastro-esophageal reflux disease without esophagitis: Secondary | ICD-10-CM | POA: Diagnosis not present

## 2019-02-23 DIAGNOSIS — R490 Dysphonia: Secondary | ICD-10-CM | POA: Diagnosis not present

## 2019-02-23 DIAGNOSIS — Z888 Allergy status to other drugs, medicaments and biological substances status: Secondary | ICD-10-CM

## 2019-02-23 DIAGNOSIS — R2689 Other abnormalities of gait and mobility: Secondary | ICD-10-CM

## 2019-02-23 NOTE — Progress Notes (Signed)
Pre visit review using our clinic review tool, if applicable. No additional management support is needed unless otherwise documented below in the visit note. 

## 2019-02-23 NOTE — Progress Notes (Signed)
Subjective:    Patient ID: George Alvarez, male    DOB: 05-Nov-1945, 73 y.o.   MRN: [id number]  DOS:  02/23/2019 Type of visit - description: Acute, multiple issues. The patient's main concern is his throat: Reports that he constantly clearing his throat he gets hoarse on and off if he talks too much. This hoarseness feeling has been noticeable for the last 2 weeks. From time to time he has difficulty swallowing saliva, "like I cannot relax my throat". Reports no problems with actually eating or drinking.  Allergies are well controlled. No nasal discharge No nausea or vomiting.  No blood in the stools. No odynophagia. GERD is not optimally controlled, occasionally has a mild heartburn.  At the end of the visit, he also mentioned a number of other neurological symptoms: Balance issues, usually when he stands up but also simply when he is walking he has to grab to wall. His perception is off, is dropping things constantly. Denies diplopia, slurred speech, motor deficits.  He is also concerned about his family history of esophageal cancer.  Review of Systems  See above Past Medical History:  Diagnosis Date  . Anal fissure   . Arthritis   . Asthma, chronic 06/16/2007   Qualifier: Diagnosis of  By: Linna Darner MD, Gwyndolyn Saxon   Onset:as child Triggers (environmental, infectious, allergic): all, mainly environmental triggers Rescue inhaler [id number] Maintenance medications/ response:Singulair,Qvar Smoking history:never Family history pulmonary disease: no    . Basal cell carcinoma of chest wall 2016   1.8 cm, treated with electrodessication and currettage  . Benign paroxysmal positional vertigo 11/19/2012   Diagnosed at [facility], [facility]  Physical therapy appointment pending   . BPH (benign prostatic hyperplasia)    With urinary obstruction  . Coronary artery disease    a. 02/2012 Cath/PCI: LM 10, LAD min irregs, LCX large, OM1 sm, 40 ost, OM2 95p (5.0x16 Veriflex & 4.5x12  Veriflex BMS'), RCA 30p, 78m, 30d, PDA/PLA min irregs, EF 65%  . Diverticulosis   . Erectile dysfunction due to arterial insufficiency   . Fundic gland polyps of stomach, benign 2010  . GERD (gastroesophageal reflux disease)   . History of elevated PSA   . History of kidney stones   . Hyperlipidemia   . Hypertension   . Nocturia   . Perennial allergic rhinitis   . Peyronie's disease   . Skin cancer    Basal and squamous cell cancers, greater than 20  . Stroke ([place]) 2016  . Tubular adenoma of colon 2010    Past Surgical History:  Procedure Laterality Date  . CORONARY ANGIOPLASTY WITH STENT PLACEMENT  02/06/2012   OM2  bare metal   . EPIDURAL BLOCK INJECTION  04-2015   Dr Brien Few  . epidural steroids  2007, 2009   X 2 @ cervical &, lumbar)  . HERNIA REPAIR    . INTRAVASCULAR ULTRASOUND  02/06/2012   Procedure: INTRAVASCULAR ULTRASOUND;  Surgeon: [name], MD;  Location: [facility] CATH LAB;  Service: Cardiovascular;;  . LEFT HEART CATHETERIZATION WITH CORONARY ANGIOGRAM N/A 02/06/2012   Procedure: LEFT HEART CATHETERIZATION WITH CORONARY ANGIOGRAM;  Surgeon: [name], MD;  Location[facility] CATH LAB;  Service: Cardiovascular;  Laterality: N/A;  . LUMBAR LAMINECTOMY/DECOMPRESSION MICRODISCECTOMY N/A 10/21/2017   Procedure: Lumbar Two-Three, Lumbar Four-Five Discectomy;  Surgeon: Ashok Pall, MD;  Location: [facility];  Service: Neurosurgery;  Laterality: N/A;  . PERCUTANEOUS CORONARY STENT INTERVENTION (PCI-S)  02/06/2012   Procedure: PERCUTANEOUS CORONARY STENT INTERVENTION (PCI-S);  Surgeon: [name], MD;  Location: [facility] CATH LAB;  Service: Cardiovascular;;  . septoplasty    . TONSILLECTOMY AND ADENOIDECTOMY    . TRIGGER FINGER RELEASE    . UPPER GASTROINTESTINAL ENDOSCOPY  2011   gastric polyps  . VASECTOMY      Social History   Socioeconomic History  . Marital status: Married    Spouse name: Not on file  . Number of children: 3  . Years of  education: Not on file  . Highest education level: Not on file  Occupational History  . Occupation: Higher education careers adviser   Social Needs  . Financial resource strain: Not on file  . Food insecurity    Worry: Not on file    Inability: Not on file  . Transportation needs    Medical: Not on file    Non-medical: Not on file  Tobacco Use  . Smoking status: Never Smoker  . Smokeless tobacco: Never Used  Substance and Sexual Activity  . Alcohol use: Yes    Alcohol/week: 0.0 standard drinks    Comment: Wine occasionally  . Drug use: No  . Sexual activity: Yes    Partners: Female  Lifestyle  . Physical activity    Days per week: Not on file    Minutes per session: Not on file  . Stress: Not on file  Relationships  . Social Herbalist on phone: Not on file    Gets together: Not on file    Attends religious service: Not on file    Active member of club or organization: Not on file    Attends meetings of clubs or organizations: Not on file    Relationship status: Not on file  . Intimate partner violence    Fear of current or ex partner: Not on file    Emotionally abused: Not on file    Physically abused: Not on file    Forced sexual activity: Not on file  Other Topics Concern  . Not on file  Social History Narrative   3 daughters, 5 g-children   lifes w/ wife in [place] as of 02/23/2019      Reactions   Aspirin Swelling   Angioedema in 1980s Medi Alert bracelet recommended   Nifedipine Dermatitis, Rash   edema      Medication List       Accurate as of February 23, 2019 11:49 AM. If you have any questions, ask your nurse or doctor.        albuterol 108 (90 Base) MCG/ACT inhaler Commonly known as: VENTOLIN HFA Inhale 2 puffs into the lungs every 6 (six) hours as needed for wheezing or shortness of breath.   azelastine 0.1 % nasal spray Commonly known as: ASTELIN Place 2 sprays into both nostrils at bedtime as needed for  rhinitis or allergies.   cholecalciferol 1000 units tablet Commonly known as: VITAMIN D Take 1,000 Units by mouth daily.   clopidogrel 75 MG tablet Commonly known as: PLAVIX TAKE 1 TABLET BY MOUTH EVERY DAY   famotidine 20 MG tablet Commonly known as: PEPCID Take 1 tablet (20 mg total) by mouth 2 (two) times daily. What changed: when to take this Changed by: Kathlene November, MD   Fish Oil 1200 MG Caps Take 1,200 mg by mouth daily.   Flovent HFA 110 MCG/ACT inhaler Generic drug: fluticasone ONE PUFF TWICE A DAY TO PREVENT COUGHING AND WHEEZING. RINSE, GARGLE AND SPIT OUT AFTER  USE What changed: See the new instructions.   fluticasone 50 MCG/ACT nasal spray Commonly known as: FLONASE ONE SPRAY EACH NOSTRIL TWICE A DAY IF NEEDED FOR STUFFY NOSE.   hydrocortisone 2.5 % cream Apply 1 application topically 2 (two) times daily.   ketoconazole 2 % shampoo Commonly known as: NIZORAL Apply 1 application topically. Once or twice a week.   metoprolol tartrate 25 MG tablet Commonly known as: LOPRESSOR Take 0.5 tablets (12.5 mg total) by mouth 2 (two) times daily.   montelukast 10 MG tablet Commonly known as: SINGULAIR TAKE 1 TABLET BY MOUTH EVERYDAY AT BEDTIME   multivitamin with minerals Tabs tablet Take 1 tablet by mouth daily.   mupirocin ointment 2 % Commonly known as: Bactroban Place 1 application into the nose 2 (two) times daily.   olmesartan-hydrochlorothiazide 20-12.5 MG tablet Commonly known as: BENICAR HCT Take 1 tablet by mouth daily.   rosuvastatin 40 MG tablet Commonly known as: CRESTOR Take 1 tablet (40 mg total) by mouth every evening.   tadalafil 5 MG tablet Commonly known as: CIALIS Take 5 mg by mouth daily as needed for erectile dysfunction.   tamsulosin 0.4 MG Caps capsule Commonly known as: FLOMAX Take 1 capsule (0.4 mg total) by mouth daily.   triamcinolone cream 0.1 % Commonly known as: KENALOG Apply 1 application topically as needed.            Objective:   Physical Exam BP 127/69 (BP Location: Left Arm, Patient Position: Sitting, Cuff Size: Normal)   Pulse (!) 58   Temp (!) 96.2 F (35.7 C) (Temporal)   Resp 16   Ht 5\' 10"  (1.778 m)   Wt 215 lb 8 oz (97.8 kg)   SpO2 97%   BMI 30.92 kg/m  General:   Well developed, NAD, BMI noted. HEENT:  Normocephalic . Face symmetric, atraumatic Neck: No thyromegaly, no mass or lymphadenopathies at the neck or supraclavicular areas Throat: Normal to inspection. Lungs:  CTA B Normal respiratory effort, no intercostal retractions, no accessory muscle use. Heart: RRR,  no murmur.  No pretibial edema bilaterally  Skin: Not pale. Not jaundice Neurologic:  alert & oriented X3.  Speech normal, gait appropriate for age and unassisted Psych--  Cognition and judgment appear intact.  Cooperative with normal attention span and concentration.  Behavior appropriate. No anxious or depressed appearing.      Assessment      Assessment  Prediabetes HTN Hyperlipidemia CV: Dr Angelena Form --CAD --Stroke, seen in a MRI Asthma- Dr Darcey Nora  MSK --DJD knees used to see Dr Len Childs 2016, local injections --Spinal stenosis (neck-back) - Dr Cyndy Freeze dx ~ 2008 via MRI neck, back, had local injections 2008; then 04-2015 GERD Benign positional vertigo-- did PT before GU: --Elevated PSA -- Dr Jeffie Pollock --Peyronie's  Disease Skin cancer : [facility] Dr Susie Cassette   PLAN: Hoarseness: Has a number of ENT symptoms including constant clearing throat, occasional hoarse voice.  He is a non-smoker.  Allergies are well controlled, denies postnasal dripping.  Symptoms could be GERD related. Plan: Refer to ENT for a thorough evaluation.  Consider GI referral as well. Off-balance: At the end of the visit he mentioned a number of neurological symptoms including off balance and the  lost off perception, dropping things, etc.  Will refer to neurology for further assessment. GERD: Unable to take PPIs because he is on Plavix,  on Pepcid daily, increase Pepcid to twice a day and recommend to discuss further with cardiology, Protonix?Marland Kitchen Aspirin allergy: He was  diagnosed with that many years ago, patient is not sure if that is in fact an allergy.  Recommend to discuss with his allergist.   Today, I spent more than 25 min with the patient: >50% of the time counseling regards multiple symptoms, she has many questions that I answered to the best of my ability.

## 2019-02-23 NOTE — Patient Instructions (Signed)
Will refer you to ENT and the neurologist   Increase Pepcid to 1 tablet twice a day   Consider Protonix

## 2019-02-24 NOTE — Assessment & Plan Note (Signed)
Hoarseness: Has a number of ENT symptoms including constant clearing throat, occasional hoarse voice.  He is a non-smoker.  Allergies are well controlled, denies postnasal dripping.  Symptoms could be GERD related. Plan: Refer to ENT for a thorough evaluation.  Consider GI referral as well. Off-balance: At the end of the visit he mentioned a number of neurological symptoms including off balance and the  lost off perception, dropping things, etc.  Will refer to neurology for further assessment. GERD: Unable to take PPIs because he is on Plavix, on Pepcid daily, increase Pepcid to twice a day and recommend to discuss further with cardiology, Protonix?Marland Kitchen Aspirin allergy: He was diagnosed with that many years ago, patient is not sure if that is in fact an allergy.  Recommend to discuss with his allergist.

## 2019-02-25 ENCOUNTER — Encounter: Payer: Self-pay | Admitting: Neurology

## 2019-02-28 ENCOUNTER — Encounter: Payer: Self-pay | Admitting: Internal Medicine

## 2019-03-01 DIAGNOSIS — H524 Presbyopia: Secondary | ICD-10-CM | POA: Diagnosis not present

## 2019-03-02 ENCOUNTER — Other Ambulatory Visit: Payer: Self-pay

## 2019-03-02 DIAGNOSIS — K219 Gastro-esophageal reflux disease without esophagitis: Secondary | ICD-10-CM | POA: Diagnosis not present

## 2019-03-02 DIAGNOSIS — R490 Dysphonia: Secondary | ICD-10-CM | POA: Diagnosis not present

## 2019-03-07 ENCOUNTER — Ambulatory Visit: Payer: PPO | Admitting: Neurology

## 2019-03-07 ENCOUNTER — Encounter: Payer: Self-pay | Admitting: Neurology

## 2019-03-07 ENCOUNTER — Other Ambulatory Visit: Payer: Self-pay

## 2019-03-07 VITALS — BP 118/75 | HR 74 | Ht 70.0 in | Wt 216.0 lb

## 2019-03-07 DIAGNOSIS — L570 Actinic keratosis: Secondary | ICD-10-CM | POA: Diagnosis not present

## 2019-03-07 DIAGNOSIS — Z85828 Personal history of other malignant neoplasm of skin: Secondary | ICD-10-CM | POA: Diagnosis not present

## 2019-03-07 DIAGNOSIS — R2681 Unsteadiness on feet: Secondary | ICD-10-CM | POA: Diagnosis not present

## 2019-03-07 DIAGNOSIS — Z08 Encounter for follow-up examination after completed treatment for malignant neoplasm: Secondary | ICD-10-CM | POA: Diagnosis not present

## 2019-03-07 DIAGNOSIS — L2089 Other atopic dermatitis: Secondary | ICD-10-CM | POA: Diagnosis not present

## 2019-03-07 DIAGNOSIS — L218 Other seborrheic dermatitis: Secondary | ICD-10-CM | POA: Diagnosis not present

## 2019-03-07 DIAGNOSIS — L821 Other seborrheic keratosis: Secondary | ICD-10-CM | POA: Diagnosis not present

## 2019-03-07 NOTE — Patient Instructions (Signed)
Nerve testing of the legs  Referral to physical therapy for balance   Return to clinic 4 months  ELECTROMYOGRAM AND NERVE CONDUCTION STUDIES (EMG/NCS) INSTRUCTIONS  How to Prepare The neurologist conducting the EMG will need to know if you have certain medical conditions. Tell the neurologist and other EMG lab personnel if you: . Have a pacemaker or any other electrical medical device . Take blood-thinning medications . Have hemophilia, a blood-clotting disorder that causes prolonged bleeding Bathing Take a shower or bath shortly before your exam in order to remove oils from your skin. Don't apply lotions or creams before the exam.  What to Expect You'll likely be asked to change into a hospital gown for the procedure and lie down on an examination table. The following explanations can help you understand what will happen during the exam.  . Electrodes. The neurologist or a technician places surface electrodes at various locations on your skin depending on where you're experiencing symptoms. Or the neurologist may insert needle electrodes at different sites depending on your symptoms.  . Sensations. The electrodes will at times transmit a tiny electrical current that you may feel as a twinge or spasm. The needle electrode may cause discomfort or pain that usually ends shortly after the needle is removed. If you are concerned about discomfort or pain, you may want to talk to the neurologist about taking a short break during the exam.  . Instructions. During the needle EMG, the neurologist will assess whether there is any spontaneous electrical activity when the muscle is at rest - activity that isn't present in healthy muscle tissue - and the degree of activity when you slightly contract the muscle.  He or she will give you instructions on resting and contracting a muscle at appropriate times. Depending on what muscles and nerves the neurologist is examining, he or she may ask you to change  positions during the exam.  After your EMG You may experience some temporary, minor bruising where the needle electrode was inserted into your muscle. This bruising should fade within several days. If it persists, contact your primary care doctor.

## 2019-03-07 NOTE — Progress Notes (Signed)
[facility] Clinic Note - Initial Visit   Date: 03/07/19  [name] MRN: [id number] DOB: 11-26-1945   Dear Dr. Larose Kells:  Thank you for your kind referral of George Alvarez [facility] for consultation of imbalance. Although his history is well known to you, please allow [name] to reiterate it for the purpose of our medical record. The patient was accompanied to the clinic by self.    History of Present Illness: George Alvarez is a 73 y.o. right-handed male with history of BPPV, bilateral small cerebellar stroke, CAD, hypertension, hyperlipidemia, BPH,  presenting for evaluation of unsteady gait.  He has previously been evaluated by Dr. Tomi Likens here in 2016 for vertigo and episode of transient altered awareness.  He had MRI/A brain performed which shows small bilateral chronic cerebellar infarct, patent vessels.  He was then referred to PT.  Starting around spring 2020, he began noticing increased imbalance, such as when walking or getting up out of a chair/bed.  He tends to hold on to furniture, when these symptoms occur.  Unsteadiness is not constant, but occurs about 2-3 times per day.  He has not suffered any falls.  He uses a cane only for long distances.  He denies leg weakness.  He has occasional numbness/tingling of the feet.  He has history of lumbar spinal canal stenosis at L4-5 and underwent decompression which alleviated his back and leg pain.     Out-side paper records, electronic medical record, and images have been reviewed where available and summarized as:  MRI brain 04/24/2014: 1. No evidence of acute intracranial abnormality or mass. 2. Chronic bilateral cerebellar infarcts. 3. Small left mastoid effusion.  MRI lumbar spine 02/03/2015: Severe spinal stenosis at L4-5 is multifactorial, related to 5 mm of facet mediated slip, severe posterior element hypertrophy, disc uncovering with focal rightward protrusion in addition to diffuse annular bulging, with  BILATERAL RIGHT greater than LEFT L4 and L5 nerve root impingement.  Similar less severe changes at L3-L4 without slip. LEFT greater than RIGHT L4 and L3 nerve root impingement is observed.  MRI cervical spine 10/06/2015: 1. At C4-5 there is moderate broad-based disc bulge. Moderate bilateral facet arthropathy and uncovertebral degenerative changes resulting in severe right and mild left foraminal stenosis. 2. At C5-6 there is a broad-based disc bulge with a central disc protrusion deforming the ventral cervical spinal cord. Mild central canal stenosis. Bilateral uncovertebral degenerative changes resulting in mild bilateral foraminal narrowing. 3. At C6-7 there is a broad-based disc bulge with a right paracentral disc protrusion. Bilateral uncovertebral degenerative changes resulting in severe left and moderate right foraminal stenosis.  Lab Results  Component Value Date   HGBA1C 5.8 11/04/2018   Lab Results  Component Value Date   [id number] 07/23/2017   Lab Results  Component Value Date   TSH 2.38 07/23/2017   Lab Results  Component Value Date   ESRSEDRATE 12 07/23/2017    Past Medical History:  Diagnosis Date  . Anal fissure   . Arthritis   . Asthma, chronic 06/16/2007   Qualifier: Diagnosis of  By: Linna Darner MD, Gwyndolyn Saxon   Onset:as child Triggers (environmental, infectious, allergic): all, mainly environmental triggers Rescue inhaler [id number] Maintenance medications/ response:Singulair,Qvar Smoking history:never Family history pulmonary disease: no    . Basal cell carcinoma of chest wall 2016   1.8 cm, treated with electrodessication and currettage  . Benign paroxysmal positional vertigo 11/19/2012   Diagn[facility], [facility]  Physical therapy appointment pending   .  BPH (benign prostatic hyperplasia)    With urinary obstruction  . Coronary artery disease    a. 02/2012 Cath/PCI: LM 10, LAD min irregs, LCX large, OM1 sm, 40 ost, OM2 95p (5.0x16 Veriflex &  4.5x12 Veriflex BMS'), RCA 30p, 66m, 30d, PDA/PLA min irregs, EF 65%  . Diverticulosis   . Erectile dysfunction due to arterial insufficiency   . Fundic gland polyps of stomach, benign 2010  . GERD (gastroesophageal reflux disease)   . History of elevated PSA   . History of kidney stones   . Hyperlipidemia   . Hypertension   . Nocturia   . Perennial allergic rhinitis   . Peyronie's disease   . Skin cancer    Basal and squamous cell cancers, greater than 20  . Stroke ([place]) 2016  . Tubular adenoma of colon 2010    Past Surgical History:  Procedure Laterality Date  . CORONARY ANGIOPLASTY WITH STENT PLACEMENT  02/06/2012   OM2  bare metal   . EPIDURAL BLOCK INJECTION  04-2015   Dr Brien Few  . epidural steroids  2007, 2009   X 2 @ cervical &, lumbar)  . HERNIA REPAIR    . INTRAVASCULAR ULTRASOUND  02/06/2012   Procedure: INTRAVASCULAR ULTRASOUND;  Surgeon: [name], MD;  Location: [facility] CATH LAB;  Service: Cardiovascular;;  . LEFT HEART CATHETERIZATION WITH CORONARY ANGIOGRAM N/A 02/06/2012   Procedure: LEFT HEART CATHETERIZATION WITH CORONARY ANGIOGRAM;  Surgeon: [name], MD;  Location: [facility] CATH LAB;  Service: Cardiovascular;  Laterality: N/A;  . LUMBAR LAMINECTOMY/DECOMPRESSION MICRODISCECTOMY N/A 10/21/2017   Procedure: Lumbar Two-Three, Lumbar Four-Five Discectomy;  Surgeon: Ashok Pall, MD;  Location: [place];  Service: Neurosurgery;  Laterality: N/A;  . PERCUTANEOUS CORONARY STENT INTERVENTION (PCI-S)  02/06/2012   Procedure: PERCUTANEOUS CORONARY STENT INTERVENTION (PCI-S);  Surgeon: [name], MD;  Location: [facility] C[facility] LAB;  Service: Cardiovascular;;  . septoplasty    . TONSILLECTOMY AND ADENOIDECTOMY    . TRIGGER FINGER RELEASE    . UPPER GASTROINTESTINAL ENDOSCOPY  2011   gastric polyps  . VASECTOMY       Medications:  Outpatient Encounter Medications as of 03/07/2019  Medication Sig  . albuterol (PROVENTIL HFA;VENTOLIN HFA) 108 (90  Base) MCG/ACT inhaler Inhale 2 puffs into the lungs every 6 (six) hours as needed for wheezing or shortness of breath.  Marland Kitchen azelastine (ASTELIN) 0.1 % nasal spray Place 2 sprays into both nostrils at bedtime as needed for rhinitis or allergies.  . cholecalciferol (VITAMIN D) 1000 units tablet Take 1,000 Units by mouth daily.  . clopidogrel (PLAVIX) 75 MG tablet TAKE 1 TABLET BY MOUTH EVERY DAY  . famotidine (PEPCID) 20 MG tablet Take 1 tablet (20 mg total) by mouth 2 (two) times daily.  Marland Kitchen FLOVENT HFA 110 MCG/ACT inhaler ONE PUFF TWICE A DAY TO PREVENT COUGHING AND WHEEZING. RINSE, GARGLE AND SPIT OUT AFTER USE (Patient taking differently: Inhale 1 puff into the lungs daily. )  . hydrocortisone 2.5 % cream Apply 1 application topically 2 (two) times daily.  Marland Kitchen ketoconazole (NIZORAL) 2 % shampoo Apply 1 application topically. Once or twice a week.  . metoprolol tartrate (LOPRESSOR) 25 MG tablet Take 0.5 tablets (12.5 mg total) by mouth 2 (two) times daily.  . montelukast (SINGULAIR) 10 MG tablet TAKE 1 TABLET BY MOUTH EVERYDAY AT BEDTIME  . Multiple Vitamin (MULTIVITAMIN WITH MINERALS) TABS tablet Take 1 tablet by mouth daily.  Marland Kitchen olmesartan-hydrochlorothiazide (BENICAR HCT) 20-12.5 MG tablet Take 1 tablet by mouth  daily.  . Omega-3 Fatty Acids (FISH OIL) 1200 MG CAPS Take 1,200 mg by mouth daily.  . rosuvastatin (CRESTOR) 40 MG tablet Take 1 tablet (40 mg total) by mouth every evening.  . tadalafil (CIALIS) 5 MG tablet Take 5 mg by mouth daily as needed for erectile dysfunction.  . tamsulosin (FLOMAX) 0.4 MG CAPS capsule Take 1 capsule (0.4 mg total) by mouth daily.  Marland Kitchen triamcinolone cream (KENALOG) 0.1 % Apply 1 application topically as needed.   Marland Kitchen dexlansoprazole (DEXILANT) 60 MG capsule Take by mouth.  . fluticasone (FLONASE) 50 MCG/ACT nasal spray ONE SPRAY EACH NOSTRIL TWICE A DAY IF NEEDED FOR STUFFY NOSE.  . [DISCONTINUED] mupirocin ointment (BACTROBAN) 2 % Place 1 application into the nose 2  (two) times daily.   No facility-administered encounter medications on file as of 03/07/2019.     Allergies:  Allergies  Allergen Reactions  . Aspirin Swelling    Angioedema in 1980s Medi Alert bracelet recommended  . Nifedipine Dermatitis and Rash    edema    Family History: Family History  Problem Relation Age of Onset  . Heart attack Father 31       died @ 26  . Heart disease Mother   . Breast cancer Mother   . Esophageal cancer Mother        Barrett's  . Heart attack Maternal Grandmother        after 65  . Dementia Maternal Grandmother        CVA  . Breast cancer Maternal Grandmother   . Diabetes Maternal Grandmother   . Stroke Maternal Grandmother        in 26s  . Heart attack Paternal Uncle 76  . Breast cancer Maternal Aunt   . Colon cancer Neg Hx   . Prostate cancer Neg Hx   . Colon polyps Neg Hx     Social History: Social History   Tobacco Use  . Smoking status: Never Smoker  . Smokeless tobacco: Never Used  Substance Use Topics  . Alcohol use: Yes    Alcohol/week: 0.0 standard drinks    Comment: Wine occasionally  . Drug use: No   Social History   Social History Narrative   3 daughters, 51 g-children   lifes w/ wife in [place]   Right handed   Two story home    Review of Systems:  CONSTITUTIONAL: No fevers, chills, night sweats, or weight loss.   EYES: No visual changes or eye pain ENT: No hearing changes.  No history of nose bleeds.   RESPIRATORY: No cough, wheezing and shortness of breath.   CARDIOVASCULAR: Negative for chest pain, and palpitations.   GI: Negative for abdominal discomfort, blood in stools or black stools.  No recent change in bowel habits.   GU:  No history of incontinence.   MUSCLOSKELETAL: No history of joint pain or swelling.  No myalgias.   SKIN: Negative for lesions, rash, and itching.   HEMATOLOGY/ONCOLOGY: Negative for prolonged bleeding, bruising easily, and swollen nodes.  No history of cancer.   ENDOCRINE:  Negative for cold or heat intolerance, polydipsia or goiter.   PSYCH:  No depression or anxiety symptoms.   NEURO: As Above.   Vital Signs:  BP 118/75   Pulse 74   Ht 5\' 10"  (1.778 m)   Wt 216 George (98 kg)   SpO2 96%   BMI 30.99 kg/m    General Medical Exam:   General:  Well appearing, comfortable.   Eyes/ENT:  see cranial nerve examination.   Neck:   No carotid bruits. Respiratory:  Clear to auscultation, good air entry bilaterally.   Cardiac:  Regular rate and rhythm, no murmur.   Extremities:  No deformities, edema, or skin discoloration.  Skin:  No rashes or lesions.  Neurological Exam: MENTAL STATUS including orientation to time, place, person, recent and remote memory, attention span and concentration, language, and fund of knowledge is normal.  Speech is not dysarthric.  CRANIAL NERVES: II:  No visual field defects.    III-IV-VI: Pupils equal round and reactive to light.  Normal conjugate, extra-ocular eye movements in all directions of gaze.  No nystagmus.  No ptosis.   V:  Normal facial sensation.    VII:  Normal facial symmetry and movements.   VIII:  Normal hearing and vestibular function.   IX-X:  Normal palatal movement.   XI:  Normal shoulder shrug and head rotation.   XII:  Normal tongue strength and range of motion, no deviation or fasciculation.  MOTOR:  No atrophy, fasciculations or abnormal movements.  No pronator drift.   Upper Extremity:  Right  Left  Deltoid  5/5   5/5   Biceps  5/5   5/5   Triceps  5/5   5/5   Infraspinatus 5/5  5/5  Medial pectoralis 5/5  5/5  Wrist extensors  5/5   5/5   Wrist flexors  5/5   5/5   Finger extensors  5/5   5/5   Finger flexors  5/5   5/5   Dorsal interossei  5/5   5/5   Abductor pollicis  5/5   5/5   Tone (Ashworth scale)  0  0   Lower Extremity:  Right  Left  Hip flexors  5/5   5/5   Hip extensors  5/5   5/5   Adductor 5/5  5/5  Abductor 5/5  5/5  Knee flexors  5/5   5/5   Knee extensors  5/5   5/5    Dorsiflexors  5/5   5/5   Plantarflexors  5/5   5/5   Toe extensors  5/5   5/5   Toe flexors  5/5   5/5   Tone (Ashworth scale)  0  0   MSRs:  Right        Left                  brachioradialis 2+  2+  biceps 2+  2+  triceps 2+  2+  patellar 3+  3+  ankle jerk 2+  2+  Hoffman no  no  plantar response down  down   SENSORY:  Normal and symmetric perception of light touch, pinprick, vibration, and proprioception.  Romberg's sign positive.   COORDINATION/GAIT: mild dysmetria with finger-to-nose testing and heel-to-shin bilaterally.   Intact rapid alternating movements bilaterally.  Able to rise from a chair without using arms.  Gait mildly wide-based, unassisted and stable. Stressed gait intact.  Unsteady with tandem gait.   IMPRESSION: Gait instability, likely multifactorial.  Exam shows mild sensory ataxia and dysmetria which is likely stemming from old cerebellar stroke, as well as brisk patella reflexes from known history of lumbar canal stenosis.   No evidence of neuropathy or neurodegenerative condition (i.e. parkinson's disease).  To be complete, I will check NCS/EMG of the legs. For imbalance, refer to physical therapy for gait training Fall precautions discussed  Return to clinic in 4 months  Thank you for allowing  me to participate in patient's care.  If I can answer any additional questions, I would be pleased to do so.    Sincerely,    Tenzin Edelman K. Posey Pronto, DO

## 2019-03-08 DIAGNOSIS — R972 Elevated prostate specific antigen [PSA]: Secondary | ICD-10-CM | POA: Diagnosis not present

## 2019-03-12 ENCOUNTER — Other Ambulatory Visit: Payer: Self-pay | Admitting: Internal Medicine

## 2019-03-12 ENCOUNTER — Other Ambulatory Visit: Payer: Self-pay | Admitting: Cardiovascular Disease

## 2019-03-15 ENCOUNTER — Other Ambulatory Visit: Payer: Self-pay

## 2019-03-15 ENCOUNTER — Ambulatory Visit: Payer: PPO | Attending: Neurology | Admitting: Physical Therapy

## 2019-03-15 ENCOUNTER — Encounter: Payer: Self-pay | Admitting: Physical Therapy

## 2019-03-15 ENCOUNTER — Encounter: Payer: PPO | Admitting: Internal Medicine

## 2019-03-15 DIAGNOSIS — G8929 Other chronic pain: Secondary | ICD-10-CM | POA: Diagnosis not present

## 2019-03-15 DIAGNOSIS — R293 Abnormal posture: Secondary | ICD-10-CM | POA: Diagnosis not present

## 2019-03-15 DIAGNOSIS — R2681 Unsteadiness on feet: Secondary | ICD-10-CM | POA: Diagnosis not present

## 2019-03-15 DIAGNOSIS — M545 Low back pain: Secondary | ICD-10-CM | POA: Insufficient documentation

## 2019-03-15 NOTE — Therapy (Signed)
[facility] [address]  [place], [place], [address] Phone: [phone]   Fax:  [phone]  Physical Therapy Evaluation  Patient Details  Name: George Alvarez MRN: [id number] Date of Birth: [birth year] Referring Provider (PT): Narda Amber, MD   Encounter Date: 03/15/2019  PT End of Session - 03/15/19 1642    Visit Number  1    Number of Visits  7    Date for PT Re-Evaluation  04/26/19    Authorization Type  [employer]    PT Start Time  1400    PT Stop Time  1442    PT Time Calculation (min)  42 min    Activity Tolerance  Patient tolerated treatment well    Behavior During Therapy  [facility] for tasks assessed/performed       Past Medical History:  Diagnosis Date  . Anal fissure   . Arthritis   . Asthma, chronic 06/16/2007   Qualifier: Diagnosis of  By: Linna Darner MD, Gwyndolyn Saxon   Onset:as child Triggers (environmental, infectious, allergic): all, mainly environmental triggers Rescue inhaler [id number] Maintenance medications/ response:Singulair,Qvar Smoking history:never Family history pulmonary disease: no    . Basal cell carcinoma of chest wall 2016   1.8 cm, treated with electrodessication and currettage  . Benign paroxysmal positional vertigo 11/19/2012   Diagnosed at [facility], [facility]  Physical therapy appointment pending   . BPH (benign prostatic hyperplasia)    With urinary obstruction  . Coronary artery disease    a. 02/2012 Cath/PCI: LM 10, LAD min irregs, LCX large, OM1 sm, 40 ost, OM2 95p (5.0x16 Veriflex & 4.5x12 Veriflex BMS'), RCA 30p, 54m, 30d, PDA/PLA min irregs, EF 65%  . Diverticulosis   . Erectile dysfunction due to arterial insufficiency   . Fundic gland polyps of stomach, benign 2010  . GERD (gastroesophageal reflux disease)   . History of elevated PSA   . History of kidney stones   . Hyperlipidemia   . Hypertension   . Nocturia   . Perennial allergic rhinitis   . Peyronie's  disease   . Skin cancer    Basal and squamous cell cancers, greater than 20  . Stroke ([place]) 2016  . Tubular adenoma of colon 2010    Past Surgical History:  Procedure Laterality Date  . CORONARY ANGIOPLASTY WITH STENT PLACEMENT  02/06/2012   OM2  bare metal   . EPIDURAL BLOCK INJECTION  04-2015   Dr Brien Few  . epidural steroids  2007, 2009   X 2 @ cervical &, lumbar)  . HERNIA REPAIR    . INTRAVASCULAR ULTRASOUND  02/06/2012   Procedure: INTRAVASCULAR ULTRASOUND;  Surgeon: [name], MD;  Location: [facility] CATH LAB;  Service: Cardiovascular;;  . LEFT HEART CATHETERIZATION WITH CORONARY ANGIOGRAM N/A 02/06/2012   Procedure: LEFT HEART CATHETERIZATION WITH CORONARY ANGIOGRAM;  Surgeon: [name], MD;  Location: [facility] CATH LAB;  Service: Cardiovascular;  Laterality: N/A;  . LUMBAR LAMINECTOMY/DECOMPRESSION MICRODISCECTOMY N/A 10/21/2017   Procedure: Lumbar Two-Three, Lumbar Four-Five Discectomy;  Surgeon: Ashok Pall, MD;  Location: [place];  Service: Neurosurgery;  Laterality: N/A;  . PERCUTANEOUS CORONARY STENT INTERVENTION (PCI-S)  02/06/2012   Procedure: PERCUTANEOUS CORONARY STENT INTERVENTION (PCI-S);  Surgeon: [name], MD;  Location: [facility] CATH LAB;  Service: Cardiovascular;;  . septoplasty    . TONSILLECTOMY AND ADENOIDECTOMY    . TRIGGER FINGER RELEASE    . UPPER GASTROINTESTINAL ENDOSCOPY  2011   gastric polyps  .  VASECTOMY      There were no vitals filed for this visit.   Subjective Assessment - 03/15/19 1402    Subjective  Patient reports that for the past several months he has been feeling unsteady. Denies dizziness, feels more like clumsiness. Reports that spontaneously he will stray to one side when walking or fall back into his chair when standing up. Occurs 1-2x per day. Also reporting that he often drops things and describes dysmetria when placing a cup down on a counter. Intermittently tripping over toes. Denies weakness in UE/LEs. Reports  tingling and numbness in LEs at night- nerve test is scheduled. Denies any actual falls.    Pertinent History  asthma, basal cell carcinoma of chest wall, BPPV, AD, GERD, kidney stones, HLD, HTN, stroke 2016, coronary angio with stent placement, L heart cath, lumbar laminectomy/decompression microdiscectomy, trigger finger release, spondylolisthesis    Limitations  Standing;Walking;House hold activities    Patient Stated Goals  learn things to keep me in balance    Currently in Pain?  No/denies         [facility] PT Assessment - 03/15/19 1409      Assessment   Medical Diagnosis  Unsteady gait    Referring Provider (PT)  Narda Amber, MD    Onset Date/Surgical Date  12/14/18    Hand Dominance  Right    Next MD Visit  --   yes- for NCV   Prior Therapy  yes- s/p back surgery      Balance Screen   Has the patient fallen in the past 6 months  No    Has the patient had a decrease in activity level because of a fear of falling?   No    Is the patient reluctant to leave their home because of a fear of falling?   No      Home Film/video editor residence    Living Arrangements  Spouse/significant other    Available Help at Discharge  Family    Type of [place] to enter    Entrance Stairs-Number of Steps  3    Entrance Stairs-Rails  Right;Left    Home Layout  Two level    Alternate Level Stairs-Number of Steps  [place] - single point      Prior Function   Level of [place]  Retired    Leisure  none      Cognition   Overall Cognitive Status  Within Functional Limits for tasks assessed      Sensation   Light Touch  Appears Intact      Coordination   Gross Motor Movements are Fluid and Coordinated  Yes    Finger Nose Finger Test  mod dysmetria L UE, mild on R UE    Heel Shin Test  WNL      Posture/Postural Control   Posture/Postural Control   Postural limitations    Postural Limitations  Forward head      ROM / Strength   AROM / PROM / Strength  Strength;AROM      AROM   AROM Assessment Site  Ankle    Right/Left Ankle  Right;Left    Right Ankle Dorsiflexion  10    Left Ankle Dorsiflexion  12      Strength   Strength Assessment Site  Hip;Knee;Ankle    Right/Left Hip  Right;Left    Right Hip Flexion  4+/5    Right Hip ABduction  4+/5    Right Hip ADduction  4+/5    Left Hip Flexion  4+/5    Left Hip ABduction  4+/5    Left Hip ADduction  4+/5    Right/Left Knee  Right;Left    Right Knee Flexion  5/5    Right Knee Extension  5/5    Left Knee Flexion  5/5    Left Knee Extension  5/5    Right/Left Ankle  Right;Left    Right Ankle Dorsiflexion  4+/5    Right Ankle Plantar Flexion  4+/5    Left Ankle Dorsiflexion  4+/5    Left Ankle Plantar Flexion  4+/5      Ambulation/Gait   Gait Pattern  Step-through pattern   audible toes tapping on floor; increased toe out     Balance   Balance Assessed  Yes      Static Standing Balance   Static Standing Balance -  Activities   Romberg - Eyes Opened;Romberg - Eyes Closed;Romberg - Eyes Opened, Foam;Romberg - Eyes Closed , Foam    Static Standing - Comment/# of Minutes  MCTSIB EO/firm- no sway; EC/firm mild sway; EO/foam moderate sway; EC/foam severe sway      Standardized Balance Assessment   Standardized Balance Assessment  Five Times Sit to Stand;Dynamic Gait Index    Five times sit to stand comments   13.40 sec   fell back into chair on 4th rep d/t imbalance     Dynamic Gait Index   Level Surface  Normal    Change in Gait Speed  Normal    Gait with Horizontal Head Turns  Severe Impairment    Gait with Vertical Head Turns  Severe Impairment    Gait and Pivot Turn  Normal    Step Over Obstacle  Mild Impairment    Step Around Obstacles  Normal    Steps  Normal    Total Score  17                Objective measurements completed on examination: See above  findings.              PT Education - 03/15/19 1641    Education Details  prognosis, POC, HEP    Person(s) Educated  Patient    Methods  Explanation;Demonstration;Tactile cues;Verbal cues;Handout    Comprehension  Verbalized understanding;Returned demonstration       PT Short Term Goals - 03/15/19 1649      PT SHORT TERM GOAL #1   Title  Independent with initial HEP    Time  3    Period  Weeks    Status  New    Target Date  04/05/19      PT SHORT TERM GOAL #2   Title  --    Time  --    Period  --    Status  --    Target Date  --      PT SHORT TERM GOAL #3   Title  --    Time  --    Period  --    Status  --    Target Date  --      PT SHORT TERM GOAL #4   Title  --    Time  --    Period  --    Status  --  Target Date  --      PT SHORT TERM GOAL #5   Title  --    Time  --    Period  --    Status  --    Target Date  --        PT Long Term Goals - 03/15/19 1651      PT LONG TERM GOAL #1   Title  Patient to be independent with advanced HEP.    Time  6    Period  Weeks    Status  New    Target Date  04/26/19      PT LONG TERM GOAL #2   Title  Patient to demonstrate 20 degrees of B ankle dorsiflexion AROM.    Time  6    Period  Weeks    Status  New    Target Date  04/26/19      PT LONG TERM GOAL #3   Title  Patient to score >19/24 on DGI to decrease risk of falls.    Time  6    Period  Weeks    Status  New    Target Date  04/26/19      PT LONG TERM GOAL #4   Title  Patient to score <12 sec on 5xSTS to decrease risk of falls.    Time  6    Period  Weeks    Status  New    Target Date  04/26/19      PT LONG TERM GOAL #5   Title  Patient to report 50% improvement in balance with transfers and walking.    Time  6    Period  Weeks    Status  New    Target Date  04/26/19             Plan - 03/15/19 1642    Clinical Impression Statement  Patient is a 73y/o M presenting to OPPT with c/o spontaneous episodes of clumsiness and  imbalance for the past few months. Patient also with hx of cerebellar stroke, possibly leading to current symptoms. Patient reports that spontaneously he will stray to one side when walking or fall back into his chair when standing up. Also reports dropping things and dysmetria in his UEs. Denies falls. Patient today presenting with B UE dysmetria with coordination testing, forward head posture, decreased B ankle AROM, and gait deviations. Patient scored within fall risk category for DGI and 5xSTS testing. Patient educated on stretching and balance HEP to be performed at counter top; patient reported understanding. Would benefit from skilled PT services 1x/week for 6 weeks to address aforementioned impairments.    Personal Factors and Comorbidities  Age;Comorbidity 3+;Fitness;Past/Current Experience;Time since onset of injury/illness/exacerbation    Comorbidities  asthma, basal cell carcinoma of chest wall, BPPV, AD, GERD, kidney stones, HLD, HTN, stroke 2016, coronary angio with stent placement, L heart cath, lumbar laminectomy/decompression microdiscectomy, trigger finger release, spondylolisthesis    Examination-Activity Limitations  Bathing;Bed Mobility;Bend;Squat;Caring for Others;Stairs;Carry;Stand;Toileting;Dressing;Transfers;Lift;Locomotion Level;Reach Overhead    Examination-Participation Restrictions  Church;Cleaning;Shop;Community Activity;Yard Work;Laundry;Meal Prep    Stability/Clinical Decision Making  Evolving/Moderate complexity    Clinical Decision Making  Moderate    Rehab Potential  Good    PT Frequency  1x / week    PT Duration  6 weeks    PT Treatment/Interventions  ADLs/Self Care Home Management;Cryotherapy;Moist Heat;Balance training;Therapeutic exercise;Therapeutic activities;Functional mobility training;Stair training;Gait training;DME Instruction;Ultrasound;Neuromuscular re-education;Patient/family education;Orthotic Fit/Training;Manual techniques;Vestibular;Taping;Energy  conservation;Passive range of motion    PT  Next Visit Plan  reassess HEP    Consulted and Agree with Plan of Care  Patient       Patient will benefit from skilled therapeutic intervention in order to improve the following deficits and impairments:  Abnormal gait, Decreased balance, Difficulty walking, Improper body mechanics, Decreased range of motion, Impaired flexibility, Postural dysfunction  Visit Diagnosis: Unsteadiness on feet     Problem List Patient Active Problem List   Diagnosis Date Noted  . HNP (herniated nucleus pulposus), lumbar 10/21/2017  . Viral upper respiratory tract infection with cough 07/06/2017  . Current use of beta blocker 08/09/2015  . Moderate persistent asthma without complication [id number]  . Other allergic rhinitis 02/20/2015  . PCP NOTES >>> 01/11/2015  . Cerebellar infarct ([place]) 06/26/2014  . BPH (benign prostatic hyperplasia) 06/22/2014  . Annual physical exam 02/20/2014  . Amnesia 02/20/2014  . Hyperglycemia 02/05/2013  . Benign paroxysmal positional vertigo 11/19/2012  . CAD (coronary artery disease) 02/07/2012  . Gastroesophageal reflux disease without esophagitis 01/01/2009  . SKIN CANCER, HX OF 12/28/2007  . Hyperlipidemia 06/16/2007  . Essential hypertension 06/16/2007  . Asthma, chronic 06/16/2007  . LUMBOSACRAL RADICULOPATHY 03/01/2007  . NEPHROLITHIASIS, HX OF 05/14/2006     Janene Harvey, PT, DPT 03/15/19 4:55 PM   [facility] [address]  [place], [place], [address] Phone: [phone]   Fax:  [phone]  Name: George Alvarez MRN: [id number] Date of Birth: 1945-09-18

## 2019-03-22 ENCOUNTER — Other Ambulatory Visit: Payer: Self-pay

## 2019-03-22 ENCOUNTER — Ambulatory Visit: Payer: PPO

## 2019-03-22 DIAGNOSIS — R2681 Unsteadiness on feet: Secondary | ICD-10-CM | POA: Diagnosis not present

## 2019-03-22 NOTE — Therapy (Signed)
[facility] [address]  [place], [place], [address] Phone: [phone]   Fax:  [phone]  Physical Therapy Treatment  Patient Details  Name: George Alvarez MRN: [id number] Date of Birth: 1946/02/02 Referring Provider (PT): Narda Amber, MD   Encounter Date: 03/22/2019  PT End of Session - 03/22/19 1319    Visit Number  2    Number of Visits  7    Date for PT Re-Evaluation  04/26/19    Authorization Type  [employer]    PT Start Time  1316    PT Stop Time  1400    PT Time Calculation (min)  44 min    Activity Tolerance  Patient tolerated treatment well    Behavior During Therapy  [facility] for tasks assessed/performed       Past Medical History:  Diagnosis Date  . Anal fissure   . Arthritis   . Asthma, chronic 06/16/2007   Qualifier: Diagnosis of  By: Linna Darner MD, Gwyndolyn Saxon   Onset:as child Triggers (environmental, infectious, allergic): all, mainly environmental triggers Rescue inhaler [id number] Maintenance medications/ response:Singulair,Qvar Smoking history:never Family history pulmonary disease: no    . Basal cell carcinoma of chest wall 2016   1.8 cm, treated with electrodessication and currettage  . Benign paroxysmal positional vertigo 11/19/2012   Diagnosed at [facility]  Physical therapy appointment pending   . BPH (benign prostatic hyperplasia)    With urinary obstruction  . Coronary artery disease    a. 02/2012 Cath/PCI: LM 10, LAD min irregs, LCX large, OM1 sm, 40 ost, OM2 95p (5.0x16 Veriflex & 4.5x12 Veriflex BMS'), RCA 30p, 76m, 30d, PDA/PLA min irregs, EF 65%  . Diverticulosis   . Erectile dysfunction due to arterial insufficiency   . Fundic gland polyps of stomach, benign 2010  . GERD (gastroesophageal reflux disease)   . History of elevated PSA   . History of kidney stones   . Hyperlipidemia   . Hypertension   . Nocturia   . Perennial allergic rhinitis   . Peyronie's  disease   . Skin cancer    Basal and squamous cell cancers, greater than 20  . Stroke ([place]) 2016  . Tubular adenoma of colon 2010    Past Surgical History:  Procedure Laterality Date  . CORONARY ANGIOPLASTY WITH STENT PLACEMENT  02/06/2012   OM2  bare metal   . EPIDURAL BLOCK INJECTION  04-2015   Dr Brien Few  . epidural steroids  2007, 2009   X 2 @ cervical &, lumbar)  . HERNIA REPAIR    . INTRAVASCULAR ULTRASOUND  02/06/2012   Procedure: INTRAVASCULAR ULTRASOUND;  Surgeon: [name], MD;  Location: [facility] CATH LAB;  Service: Cardiovascular;;  . LEFT HEART CATHETERIZATION WITH CORONARY ANGIOGRAM N/A 02/06/2012   Procedure: LEFT HEART CATHETERIZATION WITH CORONARY ANGIOGRAM;  Surgeon: [name], MD;  Location: [facility] CATH LAB;  Service: Cardiovascular;  Laterality: N/A;  . LUMBAR LAMINECTOMY/DECOMPRESSION MICRODISCECTOMY N/A 10/21/2017   Procedure: Lumbar Two-Three, Lumbar Four-Five Discectomy;  Surgeon: Ashok Pall, MD;  Location: [place];  Service: Neurosurgery;  Laterality: N/A;  . PERCUTANEOUS CORONARY STENT INTERVENTION (PCI-S)  02/06/2012   Procedure: PERCUTANEOUS CORONARY STENT INTERVENTION (PCI-S);  Surgeon: [name], MD;  Location[facility] CATH LAB;  Service: Cardiovascular;;  . septoplasty    . TONSILLECTOMY AND ADENOIDECTOMY    . TRIGGER FINGER RELEASE    . UPPER GASTROINTESTINAL ENDOSCOPY  2011   gastric polyps  .  VASECTOMY      There were no vitals filed for this visit.  Subjective Assessment - 03/22/19 1344    Subjective  Pt. noting no issues with HEP.    Pertinent History  asthma, basal cell carcinoma of chest wall, BPPV, AD, GERD, kidney stones, HLD, HTN, stroke 2016, coronary angio with stent placement, L heart cath, lumbar laminectomy/decompression microdiscectomy, trigger finger release, spondylolisthesis    Patient Stated Goals  learn things to keep me in balance    Currently in Pain?  No/denies    Multiple Pain Sites  No                        [facility] Adult PT Treatment/Exercise - 03/22/19 0001      Neuro Re-ed    Neuro Re-ed Details   at counter with light hand support:  B SLS 2 x 20 sec at counter;  Narrow stance x 30 sec eyes closed;  Staggered stance B eyes closed 2 x 30 sec eyes closed at counter with close supervision;  Corner balance with B staggered stance B in corner with chair in front in rhomberg position + head turns       Knee/Hip Exercises: Stretches   Press photographer  Right;Left;1 rep;30 seconds    Gastroc Stretch Limitations  leaning into counter       Knee/Hip Exercises: Standing   Heel Raises  Both;20 reps;3 seconds    Heel Raises Limitations  counter     Hip Flexion  Right;Left;10 reps;Knee straight;Stengthening    Hip Flexion Limitations  yellow TB at ankles; on foam and counter     Hip Abduction  Right;Left;10 reps;Knee straight;Stengthening    Abduction Limitations  yellow band standing on airex pad; counter     Hip Extension  Right;Left;10 reps;Knee straight;Stengthening    Extension Limitations  yellow TB at ankle on foam; counter       Knee/Hip Exercises: Supine   Bridges  Both;10 reps   30 sec rest between;  2 sets    Bridges Limitations  toes off table              PT Education - 03/22/19 1420    Education Details  HEP update;  corner balance in staggered and tandem stance    Person(s) Educated  Patient    Methods  Explanation;Demonstration;Verbal cues;Handout    Comprehension  Verbalized understanding;Returned demonstration;Verbal cues required       PT Short Term Goals - 03/22/19 1424      PT SHORT TERM GOAL #1   Title  Independent with initial HEP    Time  3    Period  Weeks    Status  On-going    Target Date  04/05/19        PT Long Term Goals - 03/22/19 1424      PT LONG TERM GOAL #1   Title  Patient to be independent with advanced HEP.    Time  6    Period  Weeks    Status  On-going      PT LONG TERM GOAL #2   Title  Patient to  demonstrate 20 degrees of B ankle dorsiflexion AROM.    Time  6    Period  Weeks    Status  On-going      PT LONG TERM GOAL #3   Title  Patient to score >19/24 on DGI to decrease risk of falls.    Time  6  Period  Weeks    Status  On-going      PT LONG TERM GOAL #4   Title  Patient to score <12 sec on 5xSTS to decrease risk of falls.    Time  6    Period  Weeks    Status  On-going      PT LONG TERM GOAL #5   Title  Patient to report 50% improvement in balance with transfers and walking.    Time  6    Period  Weeks    Status  On-going            Plan - 03/22/19 1419    Clinical Impression Statement  Pt. doing well today noting HEP going well without issue.  Progressed to corner balance activities in staggered and tandem stance positions adding head turns and eyes closed as pt. tolerated.  CGA/supervision required at times with pt. for safety however pt. quickly able to self-correct covert LOB in session without major fall risk.  Updated HEP with corner balance activities and will plan to monitor response in coming session.    Comorbidities  asthma, basal cell carcinoma of chest wall, BPPV, AD, GERD, kidney stones, HLD, HTN, stroke 2016, coronary angio with stent placement, L heart cath, lumbar laminectomy/decompression microdiscectomy, trigger finger release, spondylolisthesis    Rehab Potential  Good    PT Treatment/Interventions  ADLs/Self Care Home Management;Cryotherapy;Moist Heat;Balance training;Therapeutic exercise;Therapeutic activities;Functional mobility training;Stair training;Gait training;DME Instruction;Ultrasound;Neuromuscular re-education;Patient/family education;Orthotic Fit/Training;Manual techniques;Vestibular;Taping;Energy conservation;Passive range of motion    PT Next Visit Plan  Monitor HEP update    Consulted and Agree with Plan of Care  Patient       Patient will benefit from skilled therapeutic intervention in order to improve the following deficits  and impairments:  Abnormal gait, Decreased balance, Difficulty walking, Improper body mechanics, Decreased range of motion, Impaired flexibility, Postural dysfunction  Visit Diagnosis: Unsteadiness on feet     Problem List Patient Active Problem List   Diagnosis Date Noted  . HNP (herniated nucleus pulposus), lumbar 10/21/2017  . Viral upper respiratory tract infection with cough 07/06/2017  . Current use of beta blocker 08/09/2015  . Moderate persistent asthma without complication [id number]  . Other allergic rhinitis 02/20/2015  . PCP NOTES >>> 01/11/2015  . Cerebellar infarct ([place]) 06/26/2014  . BPH (benign prostatic hyperplasia) 06/22/2014  . Annual physical exam 02/20/2014  . Amnesia 02/20/2014  . Hyperglycemia 02/05/2013  . Benign paroxysmal positional vertigo 11/19/2012  . CAD (coronary artery disease) 02/07/2012  . Gastroesophageal reflux disease without esophagitis 01/01/2009  . SKIN CANCER, HX OF 12/28/2007  . Hyperlipidemia 06/16/2007  . Essential hypertension 06/16/2007  . Asthma, chronic 06/16/2007  . LUMBOSACRAL RADICULOPATHY 03/01/2007  . NEPHROLITHIASIS, HX OF 05/14/2006    Bess Harvest, PTA 03/22/19 2:24 PM   [facility] [address]  [place], [place], [address] Phone: [phone]   Fax:  [phone]  Name: George Alvarez MRN: [id number] Date of Birth: January 29, 1946

## 2019-03-23 DIAGNOSIS — N5201 Erectile dysfunction due to arterial insufficiency: Secondary | ICD-10-CM | POA: Diagnosis not present

## 2019-03-23 DIAGNOSIS — R351 Nocturia: Secondary | ICD-10-CM | POA: Diagnosis not present

## 2019-03-23 DIAGNOSIS — N3941 Urge incontinence: Secondary | ICD-10-CM | POA: Diagnosis not present

## 2019-03-23 DIAGNOSIS — N401 Enlarged prostate with lower urinary tract symptoms: Secondary | ICD-10-CM | POA: Diagnosis not present

## 2019-03-24 ENCOUNTER — Other Ambulatory Visit: Payer: Self-pay

## 2019-03-24 ENCOUNTER — Ambulatory Visit (INDEPENDENT_AMBULATORY_CARE_PROVIDER_SITE_OTHER): Payer: PPO | Admitting: Neurology

## 2019-03-24 DIAGNOSIS — M5417 Radiculopathy, lumbosacral region: Secondary | ICD-10-CM

## 2019-03-24 DIAGNOSIS — R2681 Unsteadiness on feet: Secondary | ICD-10-CM | POA: Diagnosis not present

## 2019-03-24 NOTE — Procedures (Signed)
[facility]  [place], [address]  [place], [place] [address] Tel: [phone] Fax:  [phone] Test Date:  03/24/2019  Patient: George Alvarez DOB: 28-Oct-1945 Physician: Narda Amber, DO  Sex: Male Height: 5\' 10"  Ref Phys: Narda Amber, DO  ID#: [id number] Temp: 32.0C Technician:    Patient Complaints: This is a 73 year old man referred for evaluation of unsteady gait.  NCV & EMG Findings: Extensive electrodiagnostic testing of the right lower extremity and additional studies of the left shows:  1. Bilateral sural and superficial peroneal sensory responses are within normal limits. 2. Left peroneal motor response at the extensor digitorum brevis is reduced, and normal at the tibialis anterior.  Right peroneal and bilateral tibial motor responses are within normal limits. 3. Chronic motor axonal loss changes are seen affecting the L4 myotomes bilaterally, without accompanied active denervation.  Impression: 1. Chronic L4 radiculopathy affecting bilateral lower extremities, mild. 2. There is no evidence of a large fiber sensorimotor polyneuropathy affecting the lower extremities.   ___________________________ Narda Amber, DO    Nerve Conduction Studies Anti Sensory Summary Table   Site NR Peak (ms) Norm Peak (ms) P-T Amp (V) Norm P-T Amp  Left Sup Peroneal Anti Sensory (Ant Lat Mall)  32C  12 cm    2.4 <4.6 10.1 >3  Right Sup Peroneal Anti Sensory (Ant Lat Mall)  32C  12 cm    2.7 <4.6 9.2 >3  Left Sural Anti Sensory (Lat Mall)  32C  Calf    3.0 <4.6 7.5 >3  Right Sural Anti Sensory (Lat Mall)  32C  Calf    3.1 <4.6 5.8 >3   Motor Summary Table   Site NR Onset (ms) Norm Onset (ms) O-P Amp (mV) Norm O-P Amp Site1 Site2 Delta-0 (ms) Dist (cm) Vel (m/s) Norm Vel (m/s)  Left Peroneal Motor (Ext Dig Brev)  32C  Ankle    4.2 <6.0 1.4 >2.5 B Fib Ankle 8.2 36.0 44 >40  B Fib    12.4  1.2  Poplt B Fib 1.7 8.0 47 >40  Poplt    14.1  1.2         Right  Peroneal Motor (Ext Dig Brev)  32C  Ankle    3.7 <6.0 3.6 >2.5 B Fib Ankle 8.6 38.0 44 >40  B Fib    12.3  3.1  Poplt B Fib 1.2 7.0 58 >40  Poplt    13.5  3.0         Left Peroneal TA Motor (Tib Ant)  32C  Fib Head    3.0 <4.5 7.0 >3 Poplit Fib Head 1.5 8.0 53 >40  Poplit    4.5  6.9         Left Tibial Motor (Abd Hall Brev)  32C  Ankle    4.4 <6.0 8.0 >4 Knee Ankle 9.4 40.0 43 >40  Knee    13.8  5.2         Right Tibial Motor (Abd Hall Brev)  32C  Ankle    3.4 <6.0 6.1 >4 Knee Ankle 10.3 44.0 43 >40  Knee    13.7  4.8          H Reflex Studies   NR H-Lat (ms) Lat Norm (ms) L-R H-Lat (ms)  Left Tibial (Gastroc)  32C     34.83 <35 1.63  Right Tibial (Gastroc)  32C     33.20 <35 1.63   EMG   Side Muscle Ins Act Fibs Psw  Fasc Number Recrt Dur Dur. Amp Amp. Poly Poly. Comment  Right AntTibialis Nml Nml Nml Nml 1- Rapid Some 1+ Some 1+ Nml Nml N/A  Right Gastroc Nml Nml Nml Nml Nml Nml Nml Nml Nml Nml Nml Nml N/A  Right Flex Dig Long Nml Nml Nml Nml 1- Mod Nml Nml Nml Nml Nml Nml N/A  Right RectFemoris Nml Nml Nml Nml 1- Rapid Some 1+ Some 1+ Nml Nml N/A  Right GluteusMed Nml Nml Nml Nml Nml Nml Nml Nml Nml Nml Nml Nml N/A  Left AntTibialis Nml Nml Nml Nml 1- Rapid Some 1+ Some 1+ Nml Nml N/A  Left Gastroc Nml Nml Nml Nml Nml Nml Nml Nml Nml Nml Nml Nml N/A  Left Flex Dig Long Nml Nml Nml Nml 1- Mod Nml Nml Nml Nml Nml Nml N/A  Left RectFemoris Nml Nml Nml Nml 1- Rapid Some 1+ Some 1+ Nml Nml N/A  Left GluteusMed Nml Nml Nml Nml Nml Nml Nml Nml Nml Nml Nml Nml N/A      Waveforms:

## 2019-03-25 ENCOUNTER — Encounter: Payer: Self-pay | Admitting: Internal Medicine

## 2019-03-25 ENCOUNTER — Ambulatory Visit (INDEPENDENT_AMBULATORY_CARE_PROVIDER_SITE_OTHER): Payer: PPO | Admitting: Internal Medicine

## 2019-03-25 VITALS — BP 110/62 | HR 72 | Temp 98.0°F | Resp 18 | Ht 70.0 in | Wt 216.0 lb

## 2019-03-25 DIAGNOSIS — Z0000 Encounter for general adult medical examination without abnormal findings: Secondary | ICD-10-CM | POA: Diagnosis not present

## 2019-03-25 DIAGNOSIS — R739 Hyperglycemia, unspecified: Secondary | ICD-10-CM

## 2019-03-25 MED ORDER — FAMOTIDINE 40 MG PO TABS: 40.0000 mg | ORAL_TABLET | Freq: Every day | ORAL | 6 refills | Status: DC

## 2019-03-25 NOTE — Progress Notes (Signed)
Subjective:    Patient ID: George Alvarez, male    DOB: 1946/02/20, 73 y.o.   MRN: [id number]  DOS:  03/25/2019 Type of visit - description: CPX Since the last office visit, saw ENT and neurology, notes reviewed.   Review of Systems Doing well, specifically denies chest pain, difficulty breathing. Continue feeling off balance.  Other than above, a 14 point review of systems is negative     Past Medical History:  Diagnosis Date  . Anal fissure   . Arthritis   . Asthma, chronic 06/16/2007   Qualifier: Diagnosis of  By: Linna Darner MD, Gwyndolyn Saxon   Onset:as child Triggers (environmental, infectious, allergic): all, mainly environmental triggers Rescue inhaler [id number] Maintenance medications/ response:Singulair,Qvar Smoking history:never Family history pulmonary disease: no    . Basal cell carcinoma of chest wall 2016   1.8 cm, treated with electrodessication and currettage  . Benign paroxysmal positional vertigo 11/19/2012   Diagnosed at [facility], [facility]  Physical therapy appointment pending   . BPH (benign prostatic hyperplasia)    With urinary obstruction  . Coronary artery disease    a. 02/2012 Cath/PCI: LM 10, LAD min irregs, LCX large, OM1 sm, 40 ost, OM2 95p (5.0x16 Veriflex & 4.5x12 Veriflex BMS'), RCA 30p, 66m, 30d, PDA/PLA min irregs, EF 65%  . Diverticulosis   . Erectile dysfunction due to arterial insufficiency   . Fundic gland polyps of stomach, benign 2010  . GERD (gastroesophageal reflux disease)   . History of elevated PSA   . History of kidney stones   . Hyperlipidemia   . Hypertension   . Nocturia   . Perennial allergic rhinitis   . Peyronie's disease   . Skin cancer    Basal and squamous cell cancers, greater than 20  . Stroke ([place]) 2016  . Tubular adenoma of colon 2010    Past Surgical History:  Procedure Laterality Date  . CORONARY ANGIOPLASTY WITH STENT PLACEMENT  02/06/2012   OM2  bare metal   . EPIDURAL BLOCK INJECTION  04-2015   Dr  Brien Few  . epidural steroids  2007, 2009   X 2 @ cervical &, lumbar)  . HERNIA REPAIR    . INTRAVASCULAR ULTRASOUND  02/06/2012   Procedure: INTRAVASCULAR ULTRASOUND;  Surgeon: [name], MD;  Location: [facility] CATH LAB;  Service: Cardiovascular;;  . LEFT HEART CATHETERIZATION WITH CORONARY ANGIOGRAM N/A 02/06/2012   Procedure: LEFT HEART CATHETERIZATION WITH CORONARY ANGIOGRAM;  Surgeon: [name], MD;  Location: [facility] CATH LAB;  Service: Cardiovascular;  Laterality: N/A;  . LUMBAR LAMINECTOMY/DECOMPRESSION MICRODISCECTOMY N/A 10/21/2017   Procedure: Lumbar Two-Three, Lumbar Four-Five Discectomy;  Surgeon: Ashok Pall, MD;  Location: [place];  Service: Neurosurgery;  Laterality: N/A;  . PERCUTANEOUS CORONARY STENT INTERVENTION (PCI-S)  02/06/2012   Procedure: PERCUTANEOUS CORONARY STENT INTERVENTION (PCI-S);  Surgeon: [name], MD;  Location: [facility] CATH LAB;  Service: Cardiovascular;;  . septoplasty    . TONSILLECTOMY AND ADENOIDECTOMY    . TRIGGER FINGER RELEASE    . UPPER GASTROINTESTINAL ENDOSCOPY  2011   gastric polyps  . VASECTOMY      Social History   Socioeconomic History  . Marital status: Married    Spouse name: Not on file  . Number of children: 3  . Years of education: Not on file  . Highest education level: Not on file  Occupational History  . Occupation: Higher education careers adviser   Tobacco Use  . Smoking status: Never Smoker  . Smokeless tobacco: Never  Used  Substance and Sexual Activity  . Alcohol use: Yes    Alcohol/week: 0.0 standard drinks    Comment: Wine occasionally  . Drug use: No  . Sexual activity: Yes    Partners: Female  Other Topics Concern  . Not on file  Social History Narrative   3 daughters, 98 g-children   lifes w/ wife in [place]   Right handed   Two story home   Social Determinants of Health   Financial Resource Strain:   . Difficulty of Paying Living Expenses: Not on file  Food Insecurity:   . Worried  About Charity fundraiser in the Last Year: Not on file  . Ran Out of Food in the Last Year: Not on file  Transportation Needs:   . Lack of Transportation (Medical): Not on file  . Lack of Transportation (Non-Medical): Not on file  Physical Activity:   . Days of Exercise per Week: Not on file  . Minutes of Exercise per Session: Not on file  Stress:   . Feeling of Stress : Not on file  Social Connections:   . Frequency of Communication with Friends and Family: Not on file  . Frequency of Social Gatherings with Friends and Family: Not on file  . Attends Religious Services: Not on file  . Active Member of Clubs or Organizations: Not on file  . Attends Archivist Meetings: Not on file  . Marital Status: Not on file  Intimate Partner Violence:   . Fear of Current or Ex-Partner: Not on file  . Emotionally Abused: Not on file  . Physically Abused: Not on file  . Sexually Abused: Not on file     Family History  Problem Relation Age of Onset  . Heart attack Father 22       died @ 93  . Heart disease Mother   . Breast cancer Mother   . Esophageal cancer Mother        Barrett's  . Heart attack Maternal Grandmother        after 65  . Dementia Maternal Grandmother        CVA  . Breast cancer Maternal Grandmother   . Diabetes Maternal Grandmother   . Stroke Maternal Grandmother        in 39s  . Heart attack Paternal Uncle 37  . Breast cancer Maternal Aunt   . Colon cancer Neg Hx   . Prostate cancer Neg Hx   . Colon polyps Neg Hx      Allergies as of 03/25/2019      Reactions   Aspirin Swelling   Angioedema in 1980s Medi Alert bracelet recommended   Nifedipine Dermatitis, Rash   edema      Medication List       Accurate as of March 25, 2019 11:59 PM. If you have any questions, ask your nurse or doctor.        STOP taking these medications   Dexilant 60 MG capsule Generic drug: dexlansoprazole Stopped by: Kathlene November, MD   famotidine 20 MG  tablet Commonly known as: PEPCID Stopped by: Kathlene November, MD     TAKE these medications   albuterol 108 (90 Base) MCG/ACT inhaler Commonly known as: VENTOLIN HFA Inhale 2 puffs into the lungs every 6 (six) hours as needed for wheezing or shortness of breath.   azelastine 0.1 % nasal spray Commonly known as: ASTELIN Place 2 sprays into both nostrils at bedtime as needed for rhinitis or  allergies.   cholecalciferol 1000 units tablet Commonly known as: VITAMIN D Take 1,000 Units by mouth daily.   clopidogrel 75 MG tablet Commonly known as: PLAVIX Take 1 tablet (75 mg total) by mouth daily. Please keep upcoming appt with Dr. Angelena Form in January before anymore refills. Thank you   Fish Oil 1200 MG Caps Take 1,200 mg by mouth daily.   Flovent HFA 110 MCG/ACT inhaler Generic drug: fluticasone ONE PUFF TWICE A DAY TO PREVENT COUGHING AND WHEEZING. RINSE, GARGLE AND SPIT OUT AFTER USE What changed: See the new instructions.   fluticasone 50 MCG/ACT nasal spray Commonly known as: FLONASE ONE SPRAY EACH NOSTRIL TWICE A DAY IF NEEDED FOR STUFFY NOSE.   hydrocortisone 2.5 % cream Apply 1 application topically 2 (two) times daily.   ketoconazole 2 % shampoo Commonly known as: NIZORAL Apply 1 application topically. Once or twice a week.   metoprolol tartrate 25 MG tablet Commonly known as: LOPRESSOR Take 0.5 tablets (12.5 mg total) by mouth 2 (two) times daily.   montelukast 10 MG tablet Commonly known as: SINGULAIR TAKE 1 TABLET BY MOUTH EVERYDAY AT BEDTIME   multivitamin with minerals Tabs tablet Take 1 tablet by mouth daily.   olmesartan-hydrochlorothiazide 20-12.5 MG tablet Commonly known as: BENICAR HCT Take 1 tablet by mouth daily.   pantoprazole 40 MG tablet Commonly known as: PROTONIX Take by mouth.   rosuvastatin 40 MG tablet Commonly known as: CRESTOR Take 1 tablet (40 mg total) by mouth every evening.   tadalafil 5 MG tablet Commonly known as: CIALIS Take 5  mg by mouth daily as needed for erectile dysfunction.   tamsulosin 0.4 MG Caps capsule Commonly known as: FLOMAX Take 1 capsule (0.4 mg total) by mouth daily.   triamcinolone cream 0.1 % Commonly known as: KENALOG Apply 1 application topically as needed.           Objective:   Physical Exam BP 110/62 (BP Location: Left Arm, Patient Position: Sitting, Cuff Size: Large)   Pulse 72   Temp 98 F (36.7 C) (Temporal)   Resp 18   Ht 5\' 10"  (1.778 m)   Wt 216 lb (98 kg)   SpO2 97%   BMI 30.99 kg/m  General: Well developed, NAD, BMI noted Neck: No  thyromegaly  HEENT:  Normocephalic . Face symmetric, atraumatic Lungs:  CTA B Normal respiratory effort, no intercostal retractions, no accessory muscle use. Heart: RRR,  no murmur.  No pretibial edema bilaterally  Abdomen:  Not distended, soft, non-tender. No rebound or rigidity.   Skin: Exposed areas without rash. Not pale. Not jaundice Neurologic:  alert & oriented X3.  Speech normal, gait appropriate for age and unassisted Strength symmetric and appropriate for age.  Psych: Cognition and judgment appear intact.  Cooperative with normal attention span and concentration.  Behavior appropriate. No anxious or depressed appearing.     Assessment       Assessment  Prediabetes HTN Hyperlipidemia CV: Dr Angelena Form --CAD --Stroke, seen in a MRI Asthma- Dr Darcey Nora  [facility] --DJD knees used to see Dr Len Childs 2016, local injections --Spinal stenosis (neck-back) - Dr Cyndy Freeze dx ~ 2008 via MRI neck, back, had local injections 2008; then 04-2015 GERD BPV, off balance (seen by neuro 2020)  GU: --Elevated PSA -- Dr Jeffie Pollock --Peyronie's  Disease Skin cancer : [facility] Dr Susie Cassette   PLAN: Here for CPX Prediabetes: Check A1c HTN: BP today is very good, normal readings at home, continue metoprolol, Benicar HCT.  Checking labs Hyperlipidemia:  Continue Crestor, checking labs CAD: To see cardiology in the next few weeks,  asymptomatic Hoarseness: Saw ENT 03/02/2019, diagnosis was GERD, recommended PPIs, his insurance would not cover Dexilant, he is taking Protonix which might interfere with Plavix.  ENT is considering a fiberoptic exam if not better but he is actually improving. Off-balance, gait instability: Saw neurology 03/07/2019, felt to be multifactorial.  They proceeded with a NCV with EMG showed chronic L4 radiculopathy bilaterally, no long fiber polyneuropathy.  At this point he started doing physical therapy, so far no much improvement, encouraged to continue PT. Aspirin allergy: Plans to see his allergist for that RTC 6 months   This visit occurred during the SARS-CoV-2 public health emergency.  Safety protocols were in place, including screening questions prior to the visit, additional usage of staff PPE, and extensive cleaning of exam room while observing appropriate contact time as indicated for disinfecting solutions.

## 2019-03-25 NOTE — Patient Instructions (Signed)
  GO TO THE FRONT DESK Schedule to be done next week fasting.  Schedule your next appointment with me for 6 months     Consider switch pantoprazole to famotidine at nighttime  Continue checking your blood pressure  BP GOAL is between 110/65 and  135/85. If it is consistently higher or lower, let me know

## 2019-03-26 NOTE — Assessment & Plan Note (Signed)
-  Td 2019 - Pneumonia shot 2013;Prevnar 2015 - zostavax 2013 - s/p shingrex - had a flu shot   - Pprostate Saw Dr. Jeffie Pollock this week , DRE done, psa was 2.8 per pt  -Colonoscopy in 2010, 08-2014, next 5 years per GI letter  - Labs:  CMP, FLP, A1c

## 2019-03-26 NOTE — Assessment & Plan Note (Signed)
Here for CPX Prediabetes: Check A1c HTN: BP today is very good, normal readings at home, continue metoprolol, Benicar HCT.  Checking labs Hyperlipidemia: Continue Crestor, checking labs CAD: To see cardiology in the next few weeks, asymptomatic Hoarseness: Saw ENT 03/02/2019, diagnosis was GERD, recommended PPIs, his insurance would not cover Dexilant, he is taking Protonix which might interfere with Plavix.  ENT is considering a fiberoptic exam if not better but he is actually improving. Off-balance, gait instability: Saw neurology 03/07/2019, felt to be multifactorial.  They proceeded with a NCV with EMG showed chronic L4 radiculopathy bilaterally, no long fiber polyneuropathy.  At this point he started doing physical therapy, so far no much improvement, encouraged to continue PT. Aspirin allergy: Plans to see his allergist for that RTC 6 months

## 2019-03-28 ENCOUNTER — Telehealth: Payer: Self-pay

## 2019-03-28 NOTE — Telephone Encounter (Signed)
Dr Shaune Leeks, My primary physician, Dr Larose Kells, has asked that you please do an allergy test on me for any reaction to aspirin. I believe that a diagnosis over 35 years ago may have been inaccurate. The result of your test will impact changes to my medications for blood thinners vs reflux. You recently did a full allergy back scratch test but I never thought to include aspirin.  Please let me know what appointment date is available.  Happy Holidays Thank you George Alvarez   Would you rather have a consult then have him schedule the aspirin challenge or just go ahead and schedule the aspirin challenge?

## 2019-03-29 ENCOUNTER — Ambulatory Visit: Payer: PPO | Admitting: Physical Therapy

## 2019-03-29 ENCOUNTER — Encounter: Payer: Self-pay | Admitting: Physical Therapy

## 2019-03-29 ENCOUNTER — Other Ambulatory Visit: Payer: Self-pay

## 2019-03-29 DIAGNOSIS — R2681 Unsteadiness on feet: Secondary | ICD-10-CM

## 2019-03-29 NOTE — Therapy (Signed)
[facility] [address]  [place], [place], [address] Phone: [phone]   Fax:  [phone]  Physical Therapy Treatment  Patient Details  Name: George Alvarez MRN: [id number] Date of Birth: 17-May-1945 Referring Provider (PT): Narda Amber, MD   Encounter Date: 03/29/2019  PT End of Session - 03/29/19 1403    Visit Number  3    Number of Visits  7    Date for PT Re-Evaluation  04/26/19    Authorization Type  [employer]    PT Start Time  1314    PT Stop Time  1401    PT Time Calculation (min)  47 min    Activity Tolerance  Patient tolerated treatment well    Behavior During Therapy  [facility] for tasks assessed/performed       Past Medical History:  Diagnosis Date  . Anal fissure   . Arthritis   . Asthma, chronic 06/16/2007   Qualifier: Diagnosis of  By: Linna Darner MD, Gwyndolyn Saxon   Onset:as child Triggers (environmental, infectious, allergic): all, mainly environmental triggers Rescue inhaler [id number] Maintenance medications/ response:Singulair,Qvar Smoking history:never Family history pulmonary disease: no    . Basal cell carcinoma of chest wall 2016   1.8 cm, treated with electrodessication and currettage  . Benign paroxysmal positional vertigo 11/19/2012   Diagnosed at [facility]  Physical therapy appointment pending   . BPH (benign prostatic hyperplasia)    With urinary obstruction  . Coronary artery disease    a. 02/2012 Cath/PCI: LM 10, LAD min irregs, LCX large, OM1 sm, 40 ost, OM2 95p (5.0x16 Veriflex & 4.5x12 Veriflex BMS'), RCA 30p, 54m, 30d, PDA/PLA min irregs, EF 65%  . Diverticulosis   . Erectile dysfunction due to arterial insufficiency   . Fundic gland polyps of stomach, benign 2010  . GERD (gastroesophageal reflux disease)   . History of elevated PSA   . History of kidney stones   . Hyperlipidemia   . Hypertension   . Nocturia   . Perennial allergic rhinitis   . Peyronie's  disease   . Skin cancer    Basal and squamous cell cancers, greater than 20  . Stroke ([place]) 2016  . Tubular adenoma of colon 2010    Past Surgical History:  Procedure Laterality Date  . CORONARY ANGIOPLASTY WITH STENT PLACEMENT  02/06/2012   OM2  bare metal   . EPIDURAL BLOCK INJECTION  04-2015   Dr Brien Few  . epidural steroids  2007, 2009   X 2 @ cervical &, lumbar)  . HERNIA REPAIR    . INTRAVASCULAR ULTRASOUND  02/06/2012   Procedure: INTRAVASCULAR ULTRASOUND;  Surgeon: [name], MD;  Location: [facility] CATH LAB;  Service: Cardiovascular;;  . LEFT HEART CATHETERIZATION WITH CORONARY ANGIOGRAM N/A 02/06/2012   Procedure: LEFT HEART CATHETERIZATION WITH CORONARY ANGIOGRAM;  Surgeon: [name], MD;  Location[facility] CATH LAB;  Service: Cardiovascular;  Laterality: N/A;  . LUMBAR LAMINECTOMY/DECOMPRESSION MICRODISCECTOMY N/A 10/21/2017   Procedure: Lumbar Two-Three, Lumbar Four-Five Discectomy;  Surgeon: Ashok Pall, MD;  Location: [place];  Service: Neurosurgery;  Laterality: N/A;  . PERCUTANEOUS CORONARY STENT INTERVENTION (PCI-S)  02/06/2012   Procedure: PERCUTANEOUS CORONARY STENT INTERVENTION (PCI-S);  Surgeon: [name], MD;  Location: [facility] CATH LAB;  Service: Cardiovascular;;  . septoplasty    . TONSILLECTOMY AND ADENOIDECTOMY    . TRIGGER FINGER RELEASE    . UPPER GASTROINTESTINAL ENDOSCOPY  2011   gastric polyps  .  VASECTOMY      There were no vitals filed for this visit.  Subjective Assessment - 03/29/19 1313    Subjective  Notes that his EMG showed no neuropathy.    Pertinent History  asthma, basal cell carcinoma of chest wall, BPPV, AD, GERD, kidney stones, HLD, HTN, stroke 2016, coronary angio with stent placement, L heart cath, lumbar laminectomy/decompression microdiscectomy, trigger finger release, spondylolisthesis    Patient Stated Goals  learn things to keep me in balance    Currently in Pain?  No/denies                        [facility] Adult PT Treatment/Exercise - 03/29/19 0001      Neuro Re-ed    Neuro Re-ed Details   reactive balance correction with inttentional posterior and lateral perturbations x78min; gait training with horizontal head turns x45ft with CGA;  sidestepping on foam beam 4x length of beam with CGA; static balance on foam beam + multidirectional perturbations x5 min      Exercises   Exercises  --      Knee/Hip Exercises: Aerobic   Nustep  L4 x 6 min (LEs only)      Knee/Hip Exercises: Standing   Gait Training  gait training with gaze fixation on vertical lines to help with imbalance and orienting to vertical x3 min    Other Standing Knee Exercises  anterior/posterior stepping with head turns to targets up/down with CGA x10 each LE   good stability    Other Standing Knee Exercises  romberg + VOR horizontal and vertical x10 each; with EC x10 each; on foam x10 each; EC on foam x10      Knee/Hip Exercises: Seated   Sit to Sand  2 sets;10 reps;without UE support   x10 on foam; 10x on foam with EC              PT Short Term Goals - 03/22/19 1424      PT SHORT TERM GOAL #1   Title  Independent with initial HEP    Time  3    Period  Weeks    Status  On-going    Target Date  04/05/19        PT Long Term Goals - 03/22/19 1424      PT LONG TERM GOAL #1   Title  Patient to be independent with advanced HEP.    Time  6    Period  Weeks    Status  On-going      PT LONG TERM GOAL #2   Title  Patient to demonstrate 20 degrees of B ankle dorsiflexion AROM.    Time  6    Period  Weeks    Status  On-going      PT LONG TERM GOAL #3   Title  Patient to score >19/24 on DGI to decrease risk of falls.    Time  6    Period  Weeks    Status  On-going      PT LONG TERM GOAL #4   Title  Patient to score <12 sec on 5xSTS to decrease risk of falls.    Time  6    Period  Weeks    Status  On-going      PT LONG TERM GOAL #5   Title  Patient to report  50% improvement in balance with transfers and walking.    Time  6    Period  Weeks    Status  On-going            Plan - 03/29/19 1403    Clinical Impression Statement  Patient presenting to session with report of having undergone a NCV with EMG 03/24/19, which showed no polyneuropathy. MD advised him to continue with PT. Reporting fewer episodes of imbalance recently- feels that he still has more problems with the left side. Worked on static and dynamic balance exercises today with intermittent additional challenges including head turns, lack of visual fixation, and compliant surfaces. Patient demonstrated more imbalance with exercises with horizontal  vs. vertical head turns. Overall, demonstrated very good improvement and ability to maintain balance despite increasing challenges on compliant surfaces today. Worked on STS transfers on compliant surface with patient demonstrating mild imbalance with eyes closed. Did require CGA/min A and cues to slow down to regain balance. D/t patient's improved performance today, will reassess balance next session to decide if continuing POC is necessary. Patient in agreement.    Comorbidities  asthma, basal cell carcinoma of chest wall, BPPV, AD, GERD, kidney stones, HLD, HTN, stroke 2016, coronary angio with stent placement, L heart cath, lumbar laminectomy/decompression microdiscectomy, trigger finger release, spondylolisthesis    Rehab Potential  Good    PT Treatment/Interventions  ADLs/Self Care Home Management;Cryotherapy;Moist Heat;Balance training;Therapeutic exercise;Therapeutic activities;Functional mobility training;Stair training;Gait training;DME Instruction;Ultrasound;Neuromuscular re-education;Patient/family education;Orthotic Fit/Training;Manual techniques;Vestibular;Taping;Energy conservation;Passive range of motion    PT Next Visit Plan  reassess DGI    Consulted and Agree with Plan of Care  Patient       Patient will benefit from skilled  therapeutic intervention in order to improve the following deficits and impairments:  Abnormal gait, Decreased balance, Difficulty walking, Improper body mechanics, Decreased range of motion, Impaired flexibility, Postural dysfunction  Visit Diagnosis: Unsteadiness on feet     Problem List Patient Active Problem List   Diagnosis Date Noted  . HNP (herniated nucleus pulposus), lumbar 10/21/2017  . Viral upper respiratory tract infection with cough 07/06/2017  . Current use of beta blocker 08/09/2015  . Moderate persistent asthma without complication [id number]  . Other allergic rhinitis 02/20/2015  . PCP NOTES >>> 01/11/2015  . Cerebellar infarct ([place]) 06/26/2014  . BPH (benign prostatic hyperplasia) 06/22/2014  . Annual physical exam 02/20/2014  . Amnesia 02/20/2014  . Hyperglycemia 02/05/2013  . Benign paroxysmal positional vertigo 11/19/2012  . CAD (coronary artery disease) 02/07/2012  . Gastroesophageal reflux disease without esophagitis 01/01/2009  . SKIN CANCER, HX OF 12/28/2007  . Hyperlipidemia 06/16/2007  . Essential hypertension 06/16/2007  . Asthma, chronic 06/16/2007  . LUMBOSACRAL RADICULOPATHY 03/01/2007  . NEPHROLITHIASIS, HX OF 05/14/2006     Janene Harvey, PT, DPT 03/29/19 2:52 PM   [facility] [address]  [place], [place], [address] Phone: [phone]   Fax:  [phone]  Name: ANGELES KOELLNER MRN: [id number] Date of Birth: 12-Dec-1945

## 2019-03-29 NOTE — Telephone Encounter (Signed)
I talked to the patient.  Many years ago he may have had some swelling of his lip from aspirin.  He has been avoiding aspirin since then.  He has been able to tolerate Aleve and Advil..  With his current situation with regard to blood thinners , we can go ahead and schedule an oral challenge to baby aspirin.  His wife takes baby aspirin so he will bring a few aspirins with him.  He will call and schedule the oral challenge

## 2019-03-30 NOTE — Telephone Encounter (Signed)
Pt is scheduled for aspirin challenge jan 4th

## 2019-04-05 ENCOUNTER — Encounter: Payer: Self-pay | Admitting: Physical Therapy

## 2019-04-05 ENCOUNTER — Other Ambulatory Visit: Payer: Self-pay

## 2019-04-05 ENCOUNTER — Ambulatory Visit: Payer: PPO | Admitting: Physical Therapy

## 2019-04-05 ENCOUNTER — Encounter: Payer: Self-pay | Admitting: Internal Medicine

## 2019-04-05 DIAGNOSIS — R2681 Unsteadiness on feet: Secondary | ICD-10-CM | POA: Diagnosis not present

## 2019-04-05 DIAGNOSIS — R293 Abnormal posture: Secondary | ICD-10-CM

## 2019-04-05 DIAGNOSIS — M545 Low back pain, unspecified: Secondary | ICD-10-CM

## 2019-04-05 DIAGNOSIS — G8929 Other chronic pain: Secondary | ICD-10-CM

## 2019-04-05 NOTE — Therapy (Addendum)
[facility] [address]  [place], [place], [address] Phone: [phone]   Fax:  [phone]  Physical Therapy Treatment  Patient Details  Name: George Alvarez MRN: [id number] Date of Birth: 1945-04-17 Referring Provider (PT): Narda Amber, MD   Progress Note Reporting Period 03/15/19 to 04/05/19  See note below for Objective Data and Assessment of Progress/Goals.     Encounter Date: 04/05/2019  PT End of Session - 04/05/19 1345    Visit Number  4    Number of Visits  7    Date for PT Re-Evaluation  04/26/19    Authorization Type  [employer]    PT Start Time  1316    PT Stop Time  1340    PT Time Calculation (min)  24 min    Activity Tolerance  Patient tolerated treatment well    Behavior During Therapy  WFL for tasks assessed/performed       Past Medical History:  Diagnosis Date  . Anal fissure   . Arthritis   . Asthma, chronic 06/16/2007   Qualifier: Diagnosis of  By: Linna Darner MD, Gwyndolyn Saxon   Onset:as child Triggers (environmental, infectious, allergic): all, mainly environmental triggers Rescue inhaler [id number] Maintenance medications/ response:Singulair,Qvar Smoking history:never Family history pulmonary disease: no    . Basal cell carcinoma of chest wall 2016   1.8 cm, treated with electrodessication and currettage  . Benign paroxysmal positional vertigo 11/19/2012   Diagnosed at [facility]  Physical therapy appointment pending   . BPH (benign prostatic hyperplasia)    With urinary obstruction  . Coronary artery disease    a. 02/2012 Cath/PCI: LM 10, LAD min irregs, LCX large, OM1 sm, 40 ost, OM2 95p (5.0x16 Veriflex & 4.5x12 Veriflex BMS'), RCA 30p, 49m 30d, PDA/PLA min irregs, EF 65%  . Diverticulosis   . Erectile dysfunction due to arterial insufficiency   . Fundic gland polyps of stomach, benign 2010  . GERD (gastroesophageal reflux disease)   . History of elevated PSA   .  History of kidney stones   . Hyperlipidemia   . Hypertension   . Nocturia   . Perennial allergic rhinitis   . Peyronie's disease   . Skin cancer    Basal and squamous cell cancers, greater than 20  . [place]  . Tubular adenoma of colon 2010    Past Surgical History:  Procedure Laterality Date  . CORONARY ANGIOPLASTY WITH STENT PLACEMENT  02/06/2012   OM2  bare metal   . EPIDURAL BLOCK INJECTION  04-2015   Dr BBrien Few . epidural steroids  2007, 2009   X 2 @ cervical &, lumbar)  . HERNIA REPAIR    . INTRAVASCULAR ULTRASOUND  02/06/2012   Procedure: INTRAVASCULAR ULTRASOUND;  Surgeon: CBurnell Blanks MD;  [facility];  Service: Cardiovascular;;  . LEFT HEART CATHETERIZATION WITH CORONARY ANGIOGRAM N/A 02/06/2012   Procedure: LEFT HEART CATHETERIZATION WITH CORONARY ANGIOGRAM;  Surgeon: CBurnell Blanks MD;  [facility];  Service: Cardiovascular;  Laterality: N/A;  . LUMBAR LAMINECTOMY/DECOMPRESSION MICRODISCECTOMY N/A 10/21/2017   Procedure: Lumbar Two-Three, Lumbar Four-Five Discectomy;  Surgeon: CAshok Pall MD;  [place]  Service: Neurosurgery;  Laterality: N/A;  . PERCUTANEOUS CORONARY STENT INTERVENTION (PCI-S)  02/06/2012   Procedure: PERCUTANEOUS CORONARY STENT INTERVENTION (PCI-S);  Surgeon: CBurnell Blanks MD;  [facility];  Service: Cardiovascular;;  . septoplasty    . TONSILLECTOMY AND  ADENOIDECTOMY    . TRIGGER FINGER RELEASE    . UPPER GASTROINTESTINAL ENDOSCOPY  2011   gastric polyps  . VASECTOMY      There were no vitals filed for this visit.  Subjective Assessment - 04/05/19 1317    Subjective  Feels that he has not had any recent episodes of clumsiness or imbalance. Has been playing with the grandkids over the holidays and is having some back pain from this.    Pertinent History  asthma, basal cell carcinoma of chest wall, BPPV, AD, GERD, kidney stones, HLD, HTN, stroke 2016, coronary angio with  stent placement, L heart cath, lumbar laminectomy/decompression microdiscectomy, trigger finger release, spondylolisthesis    Patient Stated Goals  learn things to keep me in balance    Currently in Pain?  Yes    Pain Score  3     Pain Location  Back    Pain Orientation  Right;Left;Lower    Pain Descriptors / Indicators  Aching    Pain Type  Acute pain         [facility] PT Assessment - 04/05/19 0001      AROM   Right Ankle Dorsiflexion  14    Left Ankle Dorsiflexion  11      Standardized Balance Assessment   Standardized Balance Assessment  Five Times Sit to Stand;Dynamic Gait Index    Five times sit to stand comments   11.57 sec      Dynamic Gait Index   Level Surface  Normal    Change in Gait Speed  Normal    Gait with Horizontal Head Turns  Normal    Gait with Vertical Head Turns  Normal    Gait and Pivot Turn  Normal    Step Over Obstacle  Normal    Step Around Obstacles  Normal    Steps  Normal    Total Score  24                   [facility] Adult PT Treatment/Exercise - 04/05/19 0001      Knee/Hip Exercises: Aerobic   Nustep  L5 x 6 min (LEs only)             PT Education - 04/05/19 1345    Education Details  review of HEP; explanation of balance test scores and clinical relevance    Person(s) Educated  Patient    Methods  Explanation;Demonstration;Tactile cues;Verbal cues    Comprehension  Verbalized understanding       PT Short Term Goals - 04/05/19 1321      PT SHORT TERM GOAL #1   Title  Independent with initial HEP    Time  3    Period  Weeks    Status  Achieved    Target Date  04/05/19        PT Long Term Goals - 04/05/19 1321      PT LONG TERM GOAL #1   Title  Patient to be independent with advanced HEP.    Time  6    Period  Weeks    Status  Achieved      PT LONG TERM GOAL #2   Title  Patient to demonstrate 20 degrees of B ankle dorsiflexion AROM.    Time  6    Period  Weeks    Status  Partially Met   still limited in B  ankles     PT LONG TERM GOAL #3   Title  Patient  to score >19/24 on DGI to decrease risk of falls.    Time  6    Period  Weeks    Status  Achieved   24/24     PT LONG TERM GOAL #4   Title  Patient to score <12 sec on 5xSTS to decrease risk of falls.    Time  6    Period  Weeks    Status  Achieved   11.57 sec     PT LONG TERM GOAL #5   Title  Patient to report 50% improvement in balance with transfers and walking.    Time  6    Period  Weeks    Status  Achieved   reporting atleast 50% improvement           Plan - 04/05/19 1347    Clinical Impression Statement  Patient reporting no episodes of clumsiness/imbalance since last session. Feels comfortable with HEP and that his stability is nearly back to baseline. Patient has now met or partially met all goals at this time. Patient reports atleast 50% improvement in his balance. Has met 5xSTS and DGI goals, now scoring out of fall risk categories for both tests. Dorsiflexion AROM still limited in B ankles, but with minor improvement in R ankle ROM today. Advised patient to continue gastroc stretching as part of HEP. Patient reported understanding. Patient demonstrating quite quick improvement in balance without obvious cause. Placing patient ton 30 day hold at this time in case of relapse.    Comorbidities  asthma, basal cell carcinoma of chest wall, BPPV, AD, GERD, kidney stones, HLD, HTN, stroke 2016, coronary angio with stent placement, L heart cath, lumbar laminectomy/decompression microdiscectomy, trigger finger release, spondylolisthesis    Rehab Potential  Good    PT Treatment/Interventions  ADLs/Self Care Home Management;Cryotherapy;Moist Heat;Balance training;Therapeutic exercise;Therapeutic activities;Functional mobility training;Stair training;Gait training;DME Instruction;Ultrasound;Neuromuscular re-education;Patient/family education;Orthotic Fit/Training;Manual techniques;Vestibular;Taping;Energy conservation;Passive range of  motion    PT Next Visit Plan  30 day hold at this time    Consulted and Agree with Plan of Care  Patient       Patient will benefit from skilled therapeutic intervention in order to improve the following deficits and impairments:  Abnormal gait, Decreased balance, Difficulty walking, Improper body mechanics, Decreased range of motion, Impaired flexibility, Postural dysfunction  Visit Diagnosis: Unsteadiness on feet  Chronic midline low back pain, unspecified whether sciatica present  Abnormal posture     Problem List Patient Active Problem List   Diagnosis Date Noted  . HNP (herniated nucleus pulposus), lumbar 10/21/2017  . Viral upper respiratory tract infection with cough 07/06/2017  . Current use of beta blocker 08/09/2015  . Moderate persistent asthma without complication 52/84/1324  . Other allergic rhinitis 02/20/2015  . PCP NOTES >>> 01/11/2015  . Cerebellar infarct ([place]) 06/26/2014  . BPH (benign prostatic hyperplasia) 06/22/2014  . Annual physical exam 02/20/2014  . Amnesia 02/20/2014  . Hyperglycemia 02/05/2013  . Benign paroxysmal positional vertigo 11/19/2012  . CAD (coronary artery disease) 02/07/2012  . Gastroesophageal reflux disease without esophagitis 01/01/2009  . SKIN CANCER, HX OF 12/28/2007  . Hyperlipidemia 06/16/2007  . Essential hypertension 06/16/2007  . Asthma, chronic 06/16/2007  . LUMBOSACRAL RADICULOPATHY 03/01/2007  . NEPHROLITHIASIS, HX OF 05/14/2006     Janene Harvey, PT, DPT 04/05/19 1:52 PM   [facility] [address]  [place], [place], [address] Phone: [phone]   Fax:  [phone]  Name: George Alvarez MRN: [id number]  Date of Birth: March 28, 1946  PHYSICAL THERAPY DISCHARGE SUMMARY  Visits from Start of Care: 4  Current functional level related to goals / functional outcomes: See above clinical impression   Remaining deficits: Decreased ankle DF    Education / Equipment: HEP  Plan: Patient agrees to discharge.  Patient goals were partially met. Patient is being discharged due to meeting the stated rehab goals.  ?????      Janene Harvey, PT, DPT 05/24/19 4:49 PM

## 2019-04-11 ENCOUNTER — Ambulatory Visit: Payer: PPO | Admitting: Physical Therapy

## 2019-04-11 ENCOUNTER — Encounter: Payer: Self-pay | Admitting: Pediatrics

## 2019-04-11 ENCOUNTER — Other Ambulatory Visit: Payer: Self-pay

## 2019-04-11 ENCOUNTER — Ambulatory Visit (INDEPENDENT_AMBULATORY_CARE_PROVIDER_SITE_OTHER): Payer: PPO | Admitting: Pediatrics

## 2019-04-11 VITALS — BP 92/68 | HR 72 | Temp 94.4°F | Resp 18

## 2019-04-11 DIAGNOSIS — K219 Gastro-esophageal reflux disease without esophagitis: Secondary | ICD-10-CM

## 2019-04-11 DIAGNOSIS — J4540 Moderate persistent asthma, uncomplicated: Secondary | ICD-10-CM

## 2019-04-11 DIAGNOSIS — L2089 Other atopic dermatitis: Secondary | ICD-10-CM

## 2019-04-11 DIAGNOSIS — T887XXD Unspecified adverse effect of drug or medicament, subsequent encounter: Secondary | ICD-10-CM

## 2019-04-11 DIAGNOSIS — I10 Essential (primary) hypertension: Secondary | ICD-10-CM

## 2019-04-11 NOTE — Progress Notes (Signed)
[address] [place] [address] Dept: [phone]  FOLLOW UP NOTE  Patient ID: George Alvarez, male    DOB: 1946-02-11  Age: 74 y.o. MRN: [id number] Date of Office Visit: 04/11/2019  Assessment  Chief Complaint: Food/Drug Challenge (aspirin)  HPI Larrion Ringger Bartleson presents for a gradual oral challenge to aspirin.  He needs to be on an anticoagulant and would prefer to be on aspirin.  About 20 or 30 years ago he recalls taking aspirin and had possibly swelling of his lips and eyes but no other symptoms.  He has been able to take ibuprofen and Aleve without any problems since then   Drug Allergies:  Allergies  Allergen Reactions  . Aspirin Swelling    Angioedema in 1980s Medi Alert bracelet recommended  . Nifedipine Dermatitis and Rash    edema    Physical Exam: BP 92/68   Pulse 72   Temp (!) 94.4 F (34.7 C) (Temporal)   Resp 18   SpO2 97%    Physical Exam Vitals reviewed.  HENT:     Head:     Comments: Eyes normal.  Ears normal.  Nose normal.  Pharynx normal. Cardiovascular:     Rate and Rhythm: Normal rate and regular rhythm.     Comments: S1-S2 normal no murmurs Pulmonary:     Comments: Clear to percussion and auscultation Musculoskeletal:     Cervical back: Neck supple.  Lymphadenopathy:     Cervical: No cervical adenopathy.  Neurological:     General: No focal deficit present.     Mental Status: He is oriented to person, place, and time. Mental status is at baseline.  Psychiatric:        Mood and Affect: Mood normal.        Behavior: Behavior normal.        Thought Content: Thought content normal.        Judgment: Judgment normal.     Diagnostics: FVC 3.53 L FEV1 2.91 L.  Predicted FVC 4.40 L predicted FEV1 3.23 L-the spirometry is in the normal range  He tolerated an oral challenge of 81 mg of aspirin 1 hour apart for a total of 162 mg  Assessment and Plan: 1. Moderate persistent asthma without complication   2. Non-dose-related  adverse reaction to medication, subsequent encounter   3. Essential hypertension   4. Gastroesophageal reflux disease without esophagitis   5. Flexural atopic dermatitis     No orders of the defined types were placed in this encounter.   Patient Instructions  [name] tolerated aspirin 81 mg challenges 1 hour apart for a total of 162 mg.  He did not have any adverse side effects  He is scheduled to see his cardiologist in 1 week.  I feel that you could add aspirin 81 mg instead of Plavix if indicated.Marland Kitchen  His cardiologist will monitor his tapering off Plavix  If his cardiologist wants to increase the dose of aspirin beyond 81 mg, I feel that he could increase the dose by 81 mg/day until he gets to  an appropriate dose  Continue on  your other medications  Call us if you are not doing well on this treatment plan   Return in about 6 months (around 10/09/2019).    Thank you for the opportunity to care for this patient.  Please do not hesitate to contact me with questions.  Penne Lash, M.D.  [facility] [address] [place], [place] [address] [phone]  [phone]  

## 2019-04-11 NOTE — Patient Instructions (Addendum)
George Alvarez tolerated aspirin 81 mg challenges 1 hour apart for a total of 162 mg.  He did not have any adverse side effects  He is scheduled to see his cardiologist in 1 week.  I feel that you could add aspirin 81 mg instead of Plavix if indicated.Marland Kitchen  His cardiologist will monitor his tapering off Plavix  If his cardiologist wants to increase the dose of aspirin beyond 81 mg, I feel that he could increase the dose by 81 mg/day until he gets to  an appropriate dose  Continue on  your other medications  Call us if you are not doing well on this treatment plan

## 2019-04-12 ENCOUNTER — Other Ambulatory Visit: Payer: Self-pay

## 2019-04-12 ENCOUNTER — Ambulatory Visit: Payer: PPO

## 2019-04-12 ENCOUNTER — Other Ambulatory Visit (INDEPENDENT_AMBULATORY_CARE_PROVIDER_SITE_OTHER): Payer: PPO

## 2019-04-12 DIAGNOSIS — Z0000 Encounter for general adult medical examination without abnormal findings: Secondary | ICD-10-CM

## 2019-04-12 DIAGNOSIS — R739 Hyperglycemia, unspecified: Secondary | ICD-10-CM

## 2019-04-12 LAB — COMPREHENSIVE METABOLIC PANEL
ALT: 19 U/L (ref 0–53)
AST: 21 U/L (ref 0–37)
Albumin: 4.4 g/dL (ref 3.5–5.2)
Alkaline Phosphatase: 45 U/L (ref 39–117)
BUN: 16 mg/dL (ref 6–23)
CO2: 29 mEq/L (ref 19–32)
Calcium: 9.7 mg/dL (ref 8.4–10.5)
Chloride: 103 mEq/L (ref 96–112)
Creatinine, Ser: 1.18 mg/dL (ref 0.40–1.50)
GFR: 60.83 mL/min (ref >60.00)
Glucose, Bld: 114 mg/dL — ABNORMAL HIGH (ref 70–99)
Potassium: 4.2 mEq/L (ref 3.5–5.1)
Sodium: 140 mEq/L (ref 135–145)
Total Bilirubin: 0.6 mg/dL (ref 0.2–1.2)
Total Protein: 6.2 g/dL (ref 6.0–8.3)

## 2019-04-12 LAB — LIPID PANEL
Cholesterol: 113 mg/dL (ref 0–200)
HDL: 33.2 mg/dL — ABNORMAL LOW (ref >39.00)
LDL Cholesterol: 42 mg/dL (ref 0–99)
NonHDL: 79.84
Total CHOL/HDL Ratio: 3
Triglycerides: 187 mg/dL — ABNORMAL HIGH (ref 0.0–149.0)
VLDL: 37.4 mg/dL (ref 0.0–40.0)

## 2019-04-12 LAB — HEMOGLOBIN A1C: Hgb A1c MFr Bld: 6.1 % (ref 4.6–6.5)

## 2019-04-17 NOTE — Progress Notes (Signed)
Chief Complaint  Patient presents with  . Follow-up    CAD   History of Present Illness: 74 yo male with history of HTN, HLD, PVCs and CAD who is here today for follow up. Cardiac cath on 02/06/12 with 95% proximal OM2 lesion and a 60% mid RCA lesion. EF was 65%. His OM2 was very large in caliber and was treated with bare-metal stents x 2. Medical management of RCA stenosis which was felt to be moderate. He has chronic dizziness and is felt to have vertigo. Normal stress myoview December 2015. He has been limited by chronic back pain due to spinal stenosis but following back surgery in July 2019 his pain improved. He been noted to be ASA  Intolerant in the past but recently had ASA testing in primary care and tolerated it well.   He is here today for follow up. The patient denies any chest pain, dyspnea, palpitations, lower extremity edema, orthopnea, PND, dizziness, near syncope or syncope. He wishes to stop Plavix and start ASA after recent normal ASA testing without any issues.   Primary Care Physician: Colon Branch, MD  Past Medical History:  Diagnosis Date  . Anal fissure   . Arthritis   . Asthma, chronic 06/16/2007   Qualifier: Diagnosis of  By: Linna Darner MD, Gwyndolyn Saxon   Onset:as child Triggers (environmental, infectious, allergic): all, mainly environmental triggers Rescue inhaler [id number] Maintenance medications/ response:Singulair,Qvar Smoking history:never Family history pulmonary disease: no    . Basal cell carcinoma of chest wall 2016   1.8 cm, treated with electrodessication and currettage  . Benign paroxysmal positional vertigo 11/19/2012   Diagnosed at [facility], [facility]  Physical therapy appointment pending   . BPH (benign prostatic hyperplasia)    With urinary obstruction  . Coronary artery disease    a. 02/2012 Cath/PCI: LM 10, LAD min irregs, LCX large, OM1 sm, 40 ost, OM2 95p (5.0x16 Veriflex & 4.5x12 Veriflex BMS'), RCA 30p, 59m, 30d, PDA/PLA min irregs, EF 65%  .  Diverticulosis   . Erectile dysfunction due to arterial insufficiency   . Fundic gland polyps of stomach, benign 2010  . GERD (gastroesophageal reflux disease)   . History of elevated PSA   . History of kidney stones   . Hyperlipidemia   . Hypertension   . Nocturia   . Perennial allergic rhinitis   . Peyronie's disease   . Skin cancer    Basal and squamous cell cancers, greater than 20  . Stroke ([place]) 2016  . Tubular adenoma of colon 2010    Past Surgical History:  Procedure Laterality Date  . CORONARY ANGIOPLASTY WITH STENT PLACEMENT  02/06/2012   OM2  bare metal   . EPIDURAL BLOCK INJECTION  04-2015   Dr Brien Few  . epidural steroids  2007, 2009   X 2 @ cervical &, lumbar)  . HERNIA REPAIR    . INTRAVASCULAR ULTRASOUND  02/06/2012   Procedure: INTRAVASCULAR ULTRASOUND;  Surgeon: [name], MD;  Location: [facility] CATH LAB;  Service: Cardiovascular;;  . LEFT HEART CATHETERIZATION WITH CORONARY ANGIOGRAM N/A 02/06/2012   Procedure: LEFT HEART CATHETERIZATION WITH CORONARY ANGIOGRAM;  Surgeon: [name], MD;  Location: [facility] CATH LAB;  Service: Cardiovascular;  Laterality: N/A;  . LUMBAR LAMINECTOMY/DECOMPRESSION MICRODISCECTOMY N/A 10/21/2017   Procedure: Lumbar Two-Three, Lumbar Four-Five Discectomy;  Surgeon: Ashok Pall, MD;  Location: [facility];  Service: Neurosurgery;  Laterality: N/A;  . PERCUTANEOUS CORONARY STENT INTERVENTION (PCI-S)  02/06/2012   Procedure: PERCUTANEOUS  CORONARY STENT INTERVENTION (PCI-S);  Surgeon: [name], MD;  Location: [facility] CATH LAB;  Service: Cardiovascular;;  . septoplasty    . TONSILLECTOMY AND ADENOIDECTOMY    . TRIGGER FINGER RELEASE    . UPPER GASTROINTESTINAL ENDOSCOPY  2011   gastric polyps  . VASECTOMY      Current Outpatient Medications  Medication Sig Dispense Refill  . albuterol (PROVENTIL HFA;VENTOLIN HFA) 108 (90 Base) MCG/ACT inhaler Inhale 2 puffs into the lungs every 6 (six) hours as needed for wheezing or  shortness of breath.    Marland Kitchen azelastine (ASTELIN) 0.1 % nasal spray Place 2 sprays into both nostrils at bedtime as needed for rhinitis or allergies. 30 mL 5  . cholecalciferol (VITAMIN D) 1000 units tablet Take 1,000 Units by mouth daily.    . fluticasone (FLONASE) 50 MCG/ACT nasal spray ONE SPRAY EACH NOSTRIL TWICE A DAY IF NEEDED FOR STUFFY NOSE. 16 g 2  . fluticasone (FLOVENT HFA) 110 MCG/ACT inhaler Inhale 1 puff into the lungs 2 (two) times daily.    . hydrocortisone 2.5 % cream Apply 1 application topically as needed.    Marland Kitchen ketoconazole (NIZORAL) 2 % shampoo Apply 1 application topically as needed for irritation.    . metoprolol tartrate (LOPRESSOR) 25 MG tablet Take 0.5 tablets (12.5 mg total) by mouth 2 (two) times daily. 90 tablet 1  . montelukast (SINGULAIR) 10 MG tablet TAKE 1 TABLET BY MOUTH EVERYDAY AT BEDTIME 90 tablet 1  . Multiple Vitamin (MULTIVITAMIN WITH MINERALS) TABS tablet Take 1 tablet by mouth daily.    Marland Kitchen olmesartan-hydrochlorothiazide (BENICAR HCT) 20-12.5 MG tablet Take 1 tablet by mouth daily. 90 tablet 1  . Omega-3 Fatty Acids (FISH OIL) 1200 MG CAPS Take 1,200 mg by mouth daily.    . pantoprazole (PROTONIX) 40 MG tablet Take by mouth.    . rosuvastatin (CRESTOR) 40 MG tablet Take 1 tablet (40 mg total) by mouth every evening. 90 tablet 1  . tadalafil (CIALIS) 5 MG tablet Take 5 mg by mouth daily as needed for erectile dysfunction.    . tamsulosin (FLOMAX) 0.4 MG CAPS capsule Take 1 capsule (0.4 mg total) by mouth daily. 15 capsule 0  . triamcinolone cream (KENALOG) 0.1 % Apply 1 application topically as needed.     Marland Kitchen aspirin EC 81 MG tablet Take 1 tablet (81 mg total) by mouth daily. 90 tablet 3   No current facility-administered medications for this visit.    Allergies  Allergen Reactions  . Aspirin Swelling    Angioedema in 1980s Medi Alert bracelet recommended  . Nifedipine Dermatitis and Rash    edema    Social History   Socioeconomic History  . Marital  status: Married    Spouse name: Not on file  . Number of children: 3  . Years of education: Not on file  . Highest education level: Not on file  Occupational History  . Occupation: Higher education careers adviser   Tobacco Use  . Smoking status: Never Smoker  . Smokeless tobacco: Never Used  Substance and Sexual Activity  . Alcohol use: Yes    Alcohol/week: 0.0 standard drinks    Comment: Wine occasionally  . Drug use: No  . Sexual activity: Yes    Partners: Female  Other Topics Concern  . Not on file  Social History Narrative   3 daughters, 12 g-children   lifes w/ wife in [place]   Right handed   Two story home   Social Determinants of Health  Financial Resource Strain:   . Difficulty of Paying Living Expenses: Not on file  Food Insecurity:   . Worried About Charity fundraiser in the Last Year: Not on file  . Ran Out of Food in the Last Year: Not on file  Transportation Needs:   . Lack of Transportation (Medical): Not on file  . Lack of Transportation (Non-Medical): Not on file  Physical Activity:   . Days of Exercise per Week: Not on file  . Minutes of Exercise per Session: Not on file  Stress:   . Feeling of Stress : Not on file  Social Connections:   . Frequency of Communication with Friends and Family: Not on file  . Frequency of Social Gatherings with Friends and Family: Not on file  . Attends Religious Services: Not on file  . Active Member of Clubs or Organizations: Not on file  . Attends Archivist Meetings: Not on file  . Marital Status: Not on file  Intimate Partner Violence:   . Fear of Current or Ex-Partner: Not on file  . Emotionally Abused: Not on file  . Physically Abused: Not on file  . Sexually Abused: Not on file    Family History  Problem Relation Age of Onset  . Heart attack Father 6       died @ 87  . Heart disease Mother   . Breast cancer Mother   . Esophageal cancer Mother        Barrett's  . Heart attack Maternal  Grandmother        after 65  . Dementia Maternal Grandmother        CVA  . Breast cancer Maternal Grandmother   . Diabetes Maternal Grandmother   . Stroke Maternal Grandmother        in 26s  . Heart attack Paternal Uncle 54  . Breast cancer Maternal Aunt   . Colon cancer Neg Hx   . Prostate cancer Neg Hx   . Colon polyps Neg Hx     Review of Systems:  As stated in the HPI and otherwise negative.   BP 108/68   Pulse 71   Ht 5\' 10"  (1.778 m)   Wt 219 lb 12.8 oz (99.7 kg)   SpO2 95%   BMI 31.54 kg/m   Physical Examination:  General: Well developed, well nourished, NAD  HEENT: OP clear, mucus membranes moist  SKIN: warm, dry. No rashes. Neuro: No focal deficits  Musculoskeletal: Muscle strength 5/5 all ext  Psychiatric: Mood and affect normal  Neck: No JVD, no carotid bruits, no thyromegaly, no lymphadenopathy.  Lungs:Clear bilaterally, no wheezes, rhonci, crackles Cardiovascular: Regular rate and rhythm. No murmurs, gallops or rubs. Abdomen:Soft. Bowel sounds present. Non-tender.  Extremities: No lower extremity edema. Pulses are 2 + in the bilateral DP/PT.  Cardiac cath 02/06/12: Left main: 10% distal stenosis.  Left Anterior Descending Artery: Large caliber vessel that courses to the apex with mild luminal irregularities proximal and distal.  Circumflex Artery: Very large caliber vessel. The first OM is small and has an ostial 40% stenosis. The second OM is very large (5.50mm) and has a 95% stenosis in the proximal portion of the vessel.  Right Coronary Artery: Large, dominant artery with 30% proximal stenosis, 60% mid stenosis, diffuse 30% distal disease. The PDA and PLA are patent with mild plaque disease.  Left Ventricular Angiogram: LVEF=65%  Impression:  1. Double vessel CAD  2. Unstable angina  3. Normal LV systolic function  4. Successful PTCA/bare metal stent x 1 OM2.   EKG:  EKG is ordered today. The ekg ordered today demonstrates sinus with PVCs  Recent  Labs: 11/22/2018: Hemoglobin 14.9; Platelets 148.0 04/12/2019: ALT 19; BUN 16; Creatinine, Ser 1.18; Potassium 4.2; Sodium 140   Lipid Panel    Component Value Date/Time   CHOL 113 04/12/2019 0801   TRIG 187.0 (H) 04/12/2019 0801   HDL 33.20 (L) 04/12/2019 0801   CHOLHDL 3 04/12/2019 0801   VLDL 37.4 04/12/2019 0801   LDLCALC 42 04/12/2019 0801   LDLDIRECT 55.0 02/22/2018 1018     Wt Readings from Last 3 Encounters:  04/18/19 219 lb 12.8 oz (99.7 kg)  03/25/19 216 lb (98 kg)  03/07/19 216 lb (98 kg)     Other studies Reviewed: Additional studies/ records that were reviewed today include: . Review of the above records demonstrates:    Assessment and Plan:   1. CAD without angina: No chest pain. Continue beta blocker and statin. He has been thought to be ASA intolerant in the past but recent ASA testing was tolerated. Will stop Plavix and start ASA 81 mg daily. Will arrange echo to assess LV systolic function.   2. HTN: BP is controlled. No changes  3. Hyperlipidemia: LDL at goal one week ago. Continue statin.    Current medicines are reviewed at length with the patient today.  The patient does not have concerns regarding medicines.  The following changes have been made:  no change  Labs/ tests ordered today include:   Orders Placed This Encounter  Procedures  . EKG 12-Lead  . ECHOCARDIOGRAM COMPLETE    Disposition:   FU with me in 12  months  Signed, Lauree Chandler, MD 04/18/2019 11:02 AM    [facility] [address], [place], [place]  [address] Phone: [phone]; Fax: [phone]

## 2019-04-18 ENCOUNTER — Other Ambulatory Visit: Payer: Self-pay

## 2019-04-18 ENCOUNTER — Encounter: Payer: Self-pay | Admitting: Cardiovascular Disease

## 2019-04-18 ENCOUNTER — Ambulatory Visit (INDEPENDENT_AMBULATORY_CARE_PROVIDER_SITE_OTHER): Payer: PPO | Admitting: Cardiovascular Disease

## 2019-04-18 VITALS — BP 108/68 | HR 71 | Ht 70.0 in | Wt 219.8 lb

## 2019-04-18 DIAGNOSIS — I10 Essential (primary) hypertension: Secondary | ICD-10-CM

## 2019-04-18 DIAGNOSIS — I2510 Atherosclerotic heart disease of native coronary artery without angina pectoris: Secondary | ICD-10-CM

## 2019-04-18 DIAGNOSIS — E7800 Pure hypercholesterolemia, unspecified: Secondary | ICD-10-CM | POA: Diagnosis not present

## 2019-04-18 MED ORDER — ASPIRIN EC 81 MG PO TBEC: 81.0000 mg | DELAYED_RELEASE_TABLET | Freq: Every day | ORAL | 3 refills | Status: AC

## 2019-04-18 NOTE — Patient Instructions (Signed)
Medication Instructions:  Your physician has recommended you make the following change in your medication:  1.) stop Plavix (clopidogrel) 2.) start aspirin 81 mg daily (enteric coated, not chewable)  *If you need a refill on your cardiac medications before your next appointment, please call your pharmacy*  Lab Work: none If you have labs (blood work) drawn today and your tests are completely normal, you will receive your results only by: Marland Kitchen MyChart Message (if you have MyChart) OR . A paper copy in the mail If you have any lab test that is abnormal or we need to change your treatment, we will call you to review the results.  Testing/Procedures: Your physician has requested that you have an echocardiogram. Echocardiography is a painless test that uses sound waves to create images of your heart. It provides your doctor with information about the size and shape of your heart and how well your heart's chambers and valves are working. This procedure takes approximately one hour. There are no restrictions for this procedure.    Follow-Up: At [facility], you and your health needs are our priority.  As part of our continuing mission to provide you with exceptional heart care, we have created designated Provider Care Teams.  These Care Teams include your primary Cardiologist (physician) and Advanced Practice Providers (APPs -  Physician Assistants and Nurse Practitioners) who all work together to provide you with the care you need, when you need it.  Your next appointment:   12 month(s)  The format for your next appointment:   Either In Person or Virtual  Provider:   You may see Lauree Chandler, MD or one of the following Advanced Practice Providers on your designated Care Team:    Melina Copa, PA-C  Ermalinda Barrios, PA-C   Other Instructions

## 2019-04-19 ENCOUNTER — Ambulatory Visit: Payer: PPO | Admitting: Physical Therapy

## 2019-04-29 ENCOUNTER — Ambulatory Visit (HOSPITAL_COMMUNITY): Payer: PPO | Attending: Cardiology

## 2019-04-29 ENCOUNTER — Other Ambulatory Visit: Payer: Self-pay

## 2019-04-29 DIAGNOSIS — E7800 Pure hypercholesterolemia, unspecified: Secondary | ICD-10-CM | POA: Diagnosis not present

## 2019-04-29 DIAGNOSIS — I10 Essential (primary) hypertension: Secondary | ICD-10-CM | POA: Diagnosis not present

## 2019-04-29 DIAGNOSIS — I2510 Atherosclerotic heart disease of native coronary artery without angina pectoris: Secondary | ICD-10-CM | POA: Diagnosis not present

## 2019-05-03 ENCOUNTER — Telehealth: Payer: Self-pay | Admitting: *Deleted

## 2019-05-03 DIAGNOSIS — I77810 Thoracic aortic ectasia: Secondary | ICD-10-CM

## 2019-05-03 NOTE — Telephone Encounter (Signed)
-----   Message from [name], MD sent at 05/02/2019  7:57 AM EST ----- His heart is strong and his valves have no significant disease. His aortic root/ascending aorta may be slightly dilated. We will need to arrange a chest CTA to better assess his thoracic aorta. He had a BMET 3 weeks ago with normal renal function so no BMET needed prior to the CTA. George Alvarez

## 2019-05-03 NOTE — Telephone Encounter (Signed)
Patient informed of echo results and recommendation for CT chest/aorta to look at thoracic aorta.  Prefers [employer], [employer].  Order placed.  Pt aware no solid foods 2 hrs prior to study.

## 2019-05-05 ENCOUNTER — Other Ambulatory Visit: Payer: Self-pay

## 2019-05-05 ENCOUNTER — Ambulatory Visit (HOSPITAL_BASED_OUTPATIENT_CLINIC_OR_DEPARTMENT_OTHER)
Admission: RE | Admit: 2019-05-05 | Discharge: 2019-05-05 | Disposition: A | Discharge status: Home / Self Care | Payer: PPO | Source: Ambulatory Visit | Attending: Cardiovascular Disease | Admitting: Cardiovascular Disease

## 2019-05-05 ENCOUNTER — Encounter (HOSPITAL_BASED_OUTPATIENT_CLINIC_OR_DEPARTMENT_OTHER): Payer: Self-pay

## 2019-05-05 DIAGNOSIS — I77810 Thoracic aortic ectasia: Secondary | ICD-10-CM | POA: Insufficient documentation

## 2019-05-05 DIAGNOSIS — I700 Atherosclerosis of aorta: Secondary | ICD-10-CM | POA: Diagnosis not present

## 2019-05-05 MED ORDER — IOHEXOL 350 MG/ML SOLN
100.0000 mL | Freq: Once | INTRAVENOUS | Status: AC | PRN
  Administered 2019-05-05: 100 mL via INTRAVENOUS

## 2019-05-09 DIAGNOSIS — L820 Inflamed seborrheic keratosis: Secondary | ICD-10-CM | POA: Diagnosis not present

## 2019-05-09 DIAGNOSIS — L570 Actinic keratosis: Secondary | ICD-10-CM | POA: Diagnosis not present

## 2019-05-09 DIAGNOSIS — D485 Neoplasm of uncertain behavior of skin: Secondary | ICD-10-CM | POA: Diagnosis not present

## 2019-05-20 ENCOUNTER — Telehealth: Payer: Self-pay | Admitting: Internal Medicine

## 2019-05-20 NOTE — Progress Notes (Signed)
  Chronic Care Management   Outreach Note  05/20/2019 Name: George Alvarez MRN: [id number] DOB: [birth year]  Referred by: Colon Branch, MD Reason for referral : No chief complaint on file.   An unsuccessful telephone outreach was attempted today. The patient was referred to the pharmacist for assistance with care management and care coordination.   Follow Up Plan:   Raynicia Dukes [employer] Scheduler

## 2019-05-20 NOTE — Chronic Care Management (AMB) (Signed)
  Chronic Care Management   Note  05/20/2019 Name: George Alvarez MRN: [id number] DOB: [birth year]  George Alvarez is a 74 y.o. year old male who is a primary care patient of Colon Branch, MD. I reached out to [employer] by phone today in response to a referral sent by George Alvarez's PCP, Colon Branch, MD.   George Alvarez was given information about Chronic Care Management services today including:  1. CCM service includes personalized support from designated clinical staff supervised by his physician, including individualized plan of care and coordination with other care providers 2. 24/7 contact phone numbers for assistance for urgent and routine care needs. 3. Service will only be billed when office clinical staff spend 20 minutes or more in a month to coordinate care. 4. Only one practitioner may furnish and bill the service in a calendar month. 5. The patient may stop CCM services at any time (effective at the end of the month) by phone call to the office staff. 6. The patient will be responsible for cost sharing (co-pay) of up to 20% of the service fee (after annual deductible is met).  Patient agreed to services and verbal consent obtained.   Follow up plan:   Raynicia Dukes [employer] Scheduler

## 2019-05-26 ENCOUNTER — Other Ambulatory Visit: Payer: Self-pay

## 2019-05-26 ENCOUNTER — Ambulatory Visit: Payer: PPO | Admitting: Pharmacist

## 2019-05-26 DIAGNOSIS — I2510 Atherosclerotic heart disease of native coronary artery without angina pectoris: Secondary | ICD-10-CM

## 2019-05-26 DIAGNOSIS — I10 Essential (primary) hypertension: Secondary | ICD-10-CM

## 2019-05-26 DIAGNOSIS — E785 Hyperlipidemia, unspecified: Secondary | ICD-10-CM

## 2019-05-26 DIAGNOSIS — R739 Hyperglycemia, unspecified: Secondary | ICD-10-CM

## 2019-05-26 DIAGNOSIS — R21 Rash and other nonspecific skin eruption: Secondary | ICD-10-CM

## 2019-05-26 DIAGNOSIS — N400 Enlarged prostate without lower urinary tract symptoms: Secondary | ICD-10-CM

## 2019-05-26 DIAGNOSIS — K219 Gastro-esophageal reflux disease without esophagitis: Secondary | ICD-10-CM

## 2019-05-26 DIAGNOSIS — J4540 Moderate persistent asthma, uncomplicated: Secondary | ICD-10-CM

## 2019-05-26 NOTE — Chronic Care Management (AMB) (Signed)
Chronic Care Management Pharmacy  Name: George Alvarez  MRN: [id number] DOB: Mar 11, 1946   Chief Complaint/ HPI  George Alvarez,  74 y.o. , male presents for their Initial CCM visit with the clinical pharmacist via telephone.  PCP : George Branch, MD  Their chronic conditions include: Asthma, HTN, HLD, CAD, Pre-Diabetes, Allergic Rhinitis, Eczema, GERD, BPH  Office Visits: 03/25/19: Office visit w/ George Alvarez - Labs drawn (a1c, lipid) arranged to aspirin allergy rechallenge.  02/23/19: Office visit w/ George Alvarez - Hoarseness (ENT referral), Gait unsteadiness (Neurology referral), GERD (increase pepcid to BID and consider protonix)  Consult Visits: 04/18/19: George Alvarez office visit w/ George Alvarez - Pt found not to be aspirin intolerant. Wished to D/C plavix and initiate aspirin. Agreeable to George Alvarez. Arranged ECHO for LV systolic function  [id number]: Allergy office visit w/ George Alvarez - Gradual oral rechallenge of aspirin. Rechallenge tolerated.   12/29,22/20: Physical Therapy w/ George Alvarez  03/24/19: Nerve conduction studies w/ George Alvarez - Chronic L4 radiculopathy affecting bilateral extremities, mild. No evidence of large fibe sensorimotor polyneuropathy affecting the lower extremities.  03/07/19: Neurology office visit w/ George Alvarez - Gait unsteadiness. Exam shows mild atax and dysmetria likely stemming from past stroke. No evidence of neuropathy or neurodegenerative disease such as parkinson's. Referral to PT. RTC in 4 months  03/02/19: ENT office visit w/ George Alvarez - Reflux causing hoarseness. Agreeable to dexilant in combination with plavix.  01/03/19: Allergy office visit w/ George Alvarez - Allergy skin testing. Cetirizine 10mg  daily for runny nose or itching. Triamcinolone 0.1% as needed for itchy skin below face  Medications: Outpatient Encounter Medications as of 05/26/2019  Medication Sig  . albuterol (PROVENTIL HFA;VENTOLIN HFA) 108 (90 Base) MCG/ACT inhaler Inhale  2 puffs into the lungs every 6 (six) hours as needed for wheezing or shortness of breath.  Marland Kitchen aspirin EC 81 MG tablet Take 1 tablet (81 mg total) by mouth daily.  Marland Kitchen azelastine (ASTELIN) 0.1 % nasal spray Place 2 sprays into both nostrils at bedtime as needed for rhinitis or allergies.  . cholecalciferol (VITAMIN D) 1000 units tablet Take 1,000 Units by mouth daily.  . fluticasone (FLONASE) 50 MCG/ACT nasal spray ONE SPRAY EACH NOSTRIL TWICE A George Alvarez IF NEEDED FOR STUFFY NOSE.  . fluticasone (FLOVENT HFA) 110 MCG/ACT inhaler Inhale 1 puff into the lungs 2 (two) times daily.  . hydrocortisone 2.5 % cream Apply 1 application topically as needed.  Marland Kitchen ketoconazole (NIZORAL) 2 % shampoo Apply 1 application topically as needed for irritation.  . metoprolol tartrate (LOPRESSOR) 25 MG tablet Take 0.5 tablets (12.5 mg total) by mouth 2 (two) times daily.  . montelukast (SINGULAIR) 10 MG tablet TAKE 1 TABLET BY MOUTH EVERYDAY AT BEDTIME  . Multiple Vitamin (MULTIVITAMIN WITH MINERALS) TABS tablet Take 1 tablet by mouth daily.  Marland Kitchen olmesartan-hydrochlorothiazide (BENICAR HCT) 20-12.5 MG tablet Take 1 tablet by mouth daily.  . Omega-3 Fatty Acids (FISH OIL) 1200 MG CAPS Take 1,200 mg by mouth daily.  . pantoprazole (PROTONIX) 40 MG tablet Take 40 mg by mouth 2 (two) times daily.  . rosuvastatin (CRESTOR) 40 MG tablet Take 1 tablet (40 mg total) by mouth every evening.  . tadalafil (CIALIS) 5 MG tablet Take 5 mg by mouth daily as needed for erectile dysfunction.  . tamsulosin (FLOMAX) 0.4 MG CAPS capsule Take 1 capsule (0.4 mg total) by mouth daily.  Marland Kitchen triamcinolone cream (KENALOG) 0.1 % Apply 1 application topically as needed.  No facility-administered encounter medications on file as of 05/26/2019.   Immunization History  Administered Date(s) Administered  . Hepatitis B 01/31/2009, 03/05/2009  . Influenza Split 12/20/2014  . Influenza Whole 12/28/2007, 12/28/2008  . Influenza, High Dose Seasonal PF  01/31/2014, 12/27/2018  . Influenza-Unspecified 01/06/2012, 12/21/2015, 11/15/2016, 12/23/2017  . Pneumococcal Conjugate-13 02/20/2014  . Pneumococcal Polysaccharide-23 01/22/2017  . Pneumococcal-Unspecified 01/06/2012  . Td 01/29/2009  . Tdap 06/05/2013, 03/10/2018  . Zoster 01/06/2012  . Zoster Recombinat (Shingrix) 10/28/2018, 02/26/2019   Received 1st COVID vaccine. Tolerated well. Only had sore arm  Current Diagnosis/Assessment:  Goals Addressed            This Visit's Progress   . A1c goal <6.5%       -Want want to prevent you from going into "full diabetes". That means we need to keep your a1c less than 6.5%    . Blood Pressure Goal <140/90 and also >90/60      . Check Blood Pressure 1 to 2 times per week and record readings      . Complete Vitamin D lab at next office visit      . Pharmacy Care Plan       Current Barriers:  . Chronic Disease Management support, education, and care coordination needs related to Asthma, HTN, HLD, CAD, Pre-Diabetes, Allergic Rhinitis, Eczema, GERD, BPH  Pharmacist Clinical Goal(s):  . BP Goal <140/90 and >90/60 . A1c Goal <6.5%  Interventions: . Comprehensive medication review performed. . Moderation with carbohydrates . Check BP 1-2 times per week and record readings . Complete Vitamin D lab at next office visit  Patient Self Care Activities:  . Patient verbalizes understanding of plan to follow as described above, Self administers medications as prescribed, Calls pharmacy for medication refills, and Calls provider office for new concerns or questions  Initial goal documentation     . Use moderation with the amount of carbohydrates eaten each George Alvarez       Married. 3 grown daughters and 5 grandkids. [place], [place], [place].    Uses pill box   Asthma   Last spirometry score: 04/11/19 FVC = 80% predicted (measured best 3.53) FEV1 = 90% predicted (measured best 2.91) FEV1/FVC = 0.82   Eosinophil count:   Lab Results  Component  Value Date/Time   EOSPCT 1.8 11/22/2018 10:23 AM  %                               Eos (Absolute):  Lab Results  Component Value Date/Time   EOSABS 0.1 11/22/2018 10:23 AM    Tobacco Status:  Social History   Tobacco Use  Smoking Status Never Smoker  Smokeless Tobacco Never Used    Patient has failed these meds in past: Qvar (unknown reason for D/C) Patient is currently controlled on the following medications: Flovent 141mcg 1 puff BID, ProAir HFA 2 puffs Q6H PRN Using maintenance inhaler regularly? Yes Frequency of rescue inhaler use:  infrequently  We discussed: rinsing mouth out after use of Flovent to prevent thrush  Plan -Continue current medications ,  Hypertension   CMP  CMP Latest Ref Rng & Units 04/12/2019 11/22/2018 02/22/2018  Glucose 70 - 99 mg/dL 114(H) 96 108(H)  BUN 6 - 23 mg/dL 16 17 21   Creatinine 0.40 - 1.50 mg/dL 1.18 1.03 1.16  Sodium 135 - 145 mEq/L 140 140 140  Potassium 3.5 - 5.1 mEq/L 4.2 4.0 4.2  Chloride 96 -  112 mEq/L 103 105 102  CO2 19 - 32 mEq/L 29 24 29   Calcium 8.4 - 10.5 mg/dL 9.7 9.7 9.9  Total Protein 6.0 - 8.3 g/dL 6.2 - 6.6  Total Bilirubin 0.2 - 1.2 mg/dL 0.6 - 0.8  Alkaline Phos 39 - 117 U/L 45 - 52  AST 0 - 37 U/L 21 - 22  ALT 0 - 53 U/L 19 - 24  GFR      60.83   71.24   66.16  BP today is:  Unable to assess due to telephone visit.  Office blood pressures are  BP Readings from Last 3 Encounters:  04/18/19 108/68  04/11/19 92/68  03/25/19 110/62    Patient has failed these meds in the past: nifedipine (dermatitis, rash, edema), losartan (factory recalled; not failed)  Patient is currently controlled on the following medications: metoprolol 25mg  1/2 tab twice daily, olmesartan-hctz 20-12.5mg  one daily  Patient checks BP at home 1-2x per week  Patient home BP readings are ranging: 120s-130s/70s-80s  Has a BP cuff. Will check BP if he feels lightheaded.   We discussed: Blood Pressure goals (<140/90 also  >90/60)  Plan -Check Blood pressure 1-2 times daily and record BP -Continue current medications   Hyperlipidemia   Lipid Panel     Component Value Date/Time   CHOL 113 04/12/2019 0801   TRIG 187.0 (H) 04/12/2019 0801   HDL 33.20 (L) 04/12/2019 0801   CHOLHDL 3 04/12/2019 0801   VLDL 37.4 04/12/2019 0801   LDLCALC 42 04/12/2019 0801   LDLDIRECT 55.0 02/22/2018 1018     ASCVD 10-year risk: Hx of ASCVD  Patient has failed these meds in past: simvastatin (in D/C list, but pt can't recall why D/C'd) Patient is currently controlled for LDL but not HDL and TG on the following medications: rosuvastatin 40mg  once daily, fish oil 1200mg  daily   We discussed:  diet and exercise extensively  Plan -Continue current medications   CAD   Patient has failed these meds in past: Plavix (was told he had an allergy to aspirin and was placed on Plavix. Had aspirin allergy performed and he is not allergic to aspirin to pt switched to aspirin 81mg ) Patient is currently controlled on the following medications: aspirin 81mg  daily, rosuvastatin 40mg  daily  Followed by cardiologist George Alvarez  Plan -Removed aspirin from allergies -Continue current medications   Pre-Diabetes   Recent Relevant Labs: Lab Results  Component Value Date/Time   HGBA1C 6.1 04/12/2019 08:01 AM   HGBA1C 5.8 11/04/2018 12:00 AM   HGBA1C 6.0 02/22/2018 10:18 AM   MICROALBUR 0.6 05/15/2006 07:47 AM     Patient is currently controlled, but trending up on the following medications: None  Last diabetic Eye exam:  Lab Results  Component Value Date/Time   HMDIABEYEEXA No Retinopathy 02/17/2017 12:00 AM    Eye Exam: 03/01/19 Last diabetic Foot exam: No results found for: HMDIABFOOTEX   We discussed: diet and exercise extensively  Plan -Practice moderation with carbohydrates -Continue control with diet and exercise   Allergic Rhinitis    Patient has failed these meds in past: Nasonex (listed in D/C'd meds,  no apparent reason why D/C'd) Patient is currently controlled on the following medications: azelastine 0.1% nasal spray 2 sprays both nostrils HS, flonase 1 spray each nostril BID, montelukast 10mg  once daily  Followed by allergist Dr. Shaune Leeks.  Plan -Continue current medications   Eczema    Patient has failed these meds in past: None noted Patient is currently  controlled on the following medications: hydrocortisone 2.5% cream topically as needed, triamcinolone 1% cream topically as needed, ketoconazole 2% shampoo topically as needed weekly  Followed by dermatologist Dr. Susie Cassette   Plan -Continue current medications   GERD    Patient has failed these meds in past: Dexilant (interferred with plavix when plavix prescribed) Patient is currently controlled on the following medications: pantoprazole 40mg  BID  Breakthrough Symptoms: No Breakthrough Tx: N/A Triggers: Pizza, tomatoe sauce Family hx of Barrett's Esophagus  Doesn't want reflux to cause asthma attack  We discussed:  avoiding triggers  Plan -Continue current medications  BPH    Patient has failed these meds in past: None noted Patient is currently controlled on the following medications: tamsulosin 0.4mg  daily, tadalafil 5mg  daily  Overnight Trips to Urinate: 1 Trouble Starting Stream: No Trouble Emptying Bladder: No   Followed by urologist Dr. Jeffie Pollock  Plan -Continue current medications   Vitamin D Deficiency   07/23/17: Vitamin D = 19  Patient has failed these meds in past: None noted Patient is currently controlled (although unable to fully assess due to needing updated labs) on the following medications: cholecalciferol 1000mg  daily   We discussed: Need for updated lab   Plan -Complete vitamin D lab at next office visit -Continue current medications  Miscellaneous Meds Ibuprofen 200mg  #3 daily (discussed NSAID use and affect on kidneys noting kidney function is normal as of now. He also knows to  eat food before taking)

## 2019-05-26 NOTE — Patient Instructions (Addendum)
Visit Information  Goals Addressed            This Visit's Progress   . A1c goal <6.5%       -Want want to prevent you from going into "full diabetes". That means we need to keep your a1c less than 6.5%    . Blood Pressure Goal <140/90 and also >90/60      . Check Blood Pressure 1 to 2 times per week and record readings      . Complete Vitamin D lab at next office visit      . Pharmacy Care Plan       Current Barriers:  . Chronic Disease Management support, education, and care coordination needs related to Asthma, HTN, HLD, CAD, Pre-Diabetes, Allergic Rhinitis, Eczema, GERD, BPH  Pharmacist Clinical Goal(s):  . BP Goal <140/90 and >90/60 . A1c Goal <6.5%  Interventions: . Comprehensive medication review performed. . Moderation with carbohydrates . Check BP 1-2 times per week and record readings . Complete Vitamin D lab at next office visit  Patient Self Care Activities:  . Patient verbalizes understanding of plan to follow as described above, Self administers medications as prescribed, Calls pharmacy for medication refills, and Calls provider office for new concerns or questions  Initial goal documentation     . Use moderation with the amount of carbohydrates eaten each George Alvarez         George Alvarez was given information about Chronic Care Management services today including:  1. CCM service includes personalized support from designated clinical staff supervised by his physician, including individualized plan of care and coordination with other care providers 2. 24/7 contact phone numbers for assistance for urgent and routine care needs. 3. Service will only be billed when office clinical staff spend 20 minutes or more in a month to coordinate care. 4. Only one practitioner may furnish and bill the service in a calendar month. 5. The patient may stop CCM services at any time (effective at the end of the month) by phone call to the office staff. 6. The patient will be responsible  for cost sharing (co-pay) of up to 20% of the service fee (after annual deductible is met).  Patient agreed to services and verbal consent obtained.   Print copy of patient instructions provided.  The pharmacy team will reach out to the patient again over the next 30 days.   De Blanch, PharmD Clinical Pharmacist [facility] [phone]    Blood Pressure Record Sheet To take your blood pressure, you will need a blood pressure machine. You can buy a blood pressure machine (blood pressure monitor) at your clinic, drug store, or online. When choosing one, consider:  An automatic monitor that has an arm cuff.  A cuff that wraps snugly around your upper arm. You should be able to fit only one finger between your arm and the cuff.  A device that stores blood pressure reading results.  Do not choose a monitor that measures your blood pressure from your wrist or finger. Follow your health care provider's instructions for how to take your blood pressure. To use this form:  Get one reading in the morning (a.m.) before you take any medicines.  Get one reading in the evening (p.m.) before supper.  Take at least 2 readings with each blood pressure check. This makes sure the results are correct. Wait 1-2 minutes between measurements.  Write down the results in the spaces on this form.  Repeat  this once a week, or as told by your health care provider.  Make a follow-up appointment with your health care provider to discuss the results. Blood pressure log Date: _______________________  a.m. _____________________(1st reading) _____________________(2nd reading)  p.m. _____________________(1st reading) _____________________(2nd reading) Date: _______________________  a.m. _____________________(1st reading) _____________________(2nd reading)  p.m. _____________________(1st reading) _____________________(2nd reading) Date: _______________________  a.m.  _____________________(1st reading) _____________________(2nd reading)  p.m. _____________________(1st reading) _____________________(2nd reading) Date: _______________________  a.m. _____________________(1st reading) _____________________(2nd reading)  p.m. _____________________(1st reading) _____________________(2nd reading) Date: _______________________  a.m. _____________________(1st reading) _____________________(2nd reading)  p.m. _____________________(1st reading) _____________________(2nd reading) This information is not intended to replace advice given to you by your health care provider. Make sure you discuss any questions you have with your health care provider. Document Revised: 05/22/2017 Document Reviewed: 03/24/2017 Elsevier Patient Education  [place].    How to Take Your Blood Pressure You can take your blood pressure at home with a machine. You may need to check your blood pressure at home:  To check if you have high blood pressure (hypertension).  To check your blood pressure over time.  To make sure your blood pressure medicine is working. Supplies needed: You will need a blood pressure machine, or monitor. You can buy one at a drugstore or online. When choosing one:  Choose one with an arm cuff.  Choose one that wraps around your upper arm. Only one finger should fit between your arm and the cuff.  Do not choose one that measures your blood pressure from your wrist or finger. Your doctor can suggest a monitor. How to prepare Avoid these things for 30 minutes before checking your blood pressure:  Drinking caffeine.  Drinking alcohol.  Eating.  Smoking.  Exercising. Five minutes before checking your blood pressure:  Pee.  Sit in a dining chair. Avoid sitting in a soft couch or armchair.  Be quiet. Do not talk. How to take your blood pressure Follow the instructions that came with your machine. If you have a digital blood pressure  monitor, these may be the instructions: 1. Sit up straight. 2. Place your feet on the floor. Do not cross your ankles or legs. 3. Rest your left arm at the level of your heart. You may rest it on a table, desk, or chair. 4. Pull up your shirt sleeve. 5. Wrap the blood pressure cuff around the upper part of your left arm. The cuff should be 1 inch (2.5 cm) above your elbow. It is best to wrap the cuff around bare skin. 6. Fit the cuff snugly around your arm. You should be able to place only one finger between the cuff and your arm. 7. Put the cord inside the groove of your elbow. 8. Press the power button. 9. Sit quietly while the cuff fills with air and loses air. 10. Write down the numbers on the screen. 11. Wait 2-3 minutes and then repeat steps 1-10. What do the numbers mean? Two numbers make up your blood pressure. The first number is called systolic pressure. The second is called diastolic pressure. An example of a blood pressure reading is "120 over 80" (or 120/80). If you are an adult and do not have a medical condition, use this guide to find out if your blood pressure is normal: Normal  First number: below 120.  Second number: below 80. Elevated  First number: 120-129.  Second number: below 80. Hypertension stage 1  First number: 130-139.  Second number: 80-89. Hypertension stage 2  First  number: 140 or above.  Second number: 43 or above. Your blood pressure is above normal even if only the top or bottom number is above normal. Follow these instructions at home:  Check your blood pressure as often as your doctor tells you to.  Take your monitor to your next doctor's appointment. Your doctor will: ? Make sure you are using it correctly. ? Make sure it is working right.  Make sure you understand what your blood pressure numbers should be.  Tell your doctor if your medicines are causing side effects. Contact a doctor if:  Your blood pressure keeps being  high. Get help right away if:  Your first blood pressure number is higher than 180.  Your second blood pressure number is higher than 120. This information is not intended to replace advice given to you by your health care provider. Make sure you discuss any questions you have with your health care provider. Document Revised: 03/06/2017 Document Reviewed: 08/31/2015 Elsevier Patient Education  2020 [employer].

## 2019-05-27 ENCOUNTER — Encounter: Payer: Self-pay | Admitting: Internal Medicine

## 2019-05-27 MED ORDER — PANTOPRAZOLE SODIUM 40 MG PO TBEC: 40.0000 mg | DELAYED_RELEASE_TABLET | Freq: Two times a day (BID) | ORAL | 3 refills | Status: DC

## 2019-06-03 ENCOUNTER — Encounter: Payer: Self-pay | Admitting: Neurology

## 2019-06-03 ENCOUNTER — Other Ambulatory Visit: Payer: Self-pay | Admitting: Internal Medicine

## 2019-06-06 ENCOUNTER — Telehealth (INDEPENDENT_AMBULATORY_CARE_PROVIDER_SITE_OTHER): Payer: PPO | Admitting: Neurology

## 2019-06-06 ENCOUNTER — Encounter: Payer: Self-pay | Admitting: Neurology

## 2019-06-06 ENCOUNTER — Other Ambulatory Visit: Payer: Self-pay

## 2019-06-06 DIAGNOSIS — M5417 Radiculopathy, lumbosacral region: Secondary | ICD-10-CM

## 2019-06-06 DIAGNOSIS — R2681 Unsteadiness on feet: Secondary | ICD-10-CM | POA: Diagnosis not present

## 2019-06-06 NOTE — Progress Notes (Signed)
   Virtual Visit via Video Note The purpose of this virtual visit is to provide medical care while limiting exposure to the novel coronavirus.    Consent was obtained for video visit:  Yes.   Answered questions that patient had about telehealth interaction:  Yes.   I discussed the limitations, risks, security and privacy concerns of performing an evaluation and management service by telemedicine. I also discussed with the patient that there may be a patient responsible charge related to this service. The patient expressed understanding and agreed to proceed.  Pt location: Home Physician Location: office Name of referring provider:  Colon Branch, MD I connected with Melene Plan Mckenzie at patients initiation/request on 06/06/2019 at  2:30 PM EST by video enabled telemedicine application and verified that I am speaking with the correct person using two identifiers. Pt MRN:  [id number] Pt DOB:  [birth year] Video Participants:  Melene Plan Worden   History of Present Illness: This is a 74 y.o. male returning for follow-up of gait instability.  He underwent electrodiagnostic testing which showed chronic bilateral L4 radiculopathy and was subsequently referred to physical therapy.  He did very well with physical therapy exercises and has been able to get back to walking normally and is much more active as well.  He denies any ongoing weakness, instability, or interval falls.   Observations/Objective:   Patient is awake, alert, and appears comfortable.   Face is symmetric.  Speech is not dysarthric.  Antigravity in all extremities.  No pronator drift. Gait appears normal.  Stressed gait is normal.  NCS/EMG of the legs 03/24/2019: 1. Chronic L4 radiculopathy affecting bilateral lower extremities, mild. 2. There is no evidence of a large fiber sensorimotor polyneuropathy affecting the lower extremities.  MRI lumbar spine wo contrast 09/26/2017: 1. New left paracentral disc protrusion at L2-3  contributing to left lateral recess stenosis and likely effecting the left L3 nerve root. 2. Stable moderately severe spinal and bilateral lateral recess stenosis at L3-4, right greater than left. 3. Persistent severe multifactorial spinal and bilateral lateral recess stenosis which is multifactorial. There is a new small extruded disc fragment up behind the L4-5 disc space.  Assessment and Plan:  Gait instability due to lumbosacral spondylosis and spinal canal stenosis, improved with physical therapy.  Exam shows markedly improved gait assessment.  -Patient encouraged to continue home exercises   Follow Up Instructions:   I discussed the assessment and treatment plan with the patient. The patient was provided an opportunity to ask questions and all were answered. The patient agreed with the plan and demonstrated an understanding of the instructions.   The patient was advised to call back or seek an in-person evaluation if the symptoms worsen or if the condition fails to improve as anticipated.  Follow-up as needed   Alda Berthold, DO

## 2019-06-12 ENCOUNTER — Other Ambulatory Visit: Payer: Self-pay | Admitting: Cardiovascular Disease

## 2019-07-05 DIAGNOSIS — Z6831 Body mass index (BMI) 31.0-31.9, adult: Secondary | ICD-10-CM | POA: Diagnosis not present

## 2019-07-05 DIAGNOSIS — I10 Essential (primary) hypertension: Secondary | ICD-10-CM | POA: Diagnosis not present

## 2019-07-05 DIAGNOSIS — M48062 Spinal stenosis, lumbar region with neurogenic claudication: Secondary | ICD-10-CM | POA: Diagnosis not present

## 2019-07-09 ENCOUNTER — Other Ambulatory Visit: Payer: Self-pay | Admitting: Cardiovascular Disease

## 2019-07-13 ENCOUNTER — Ambulatory Visit: Payer: Self-pay | Admitting: Pharmacist

## 2019-07-13 NOTE — Chronic Care Management (AMB) (Unsigned)
Chronic Care Management Pharmacy  Name: George Alvarez  MRN: [id number] DOB: 1945-10-17   Chief Complaint/ HPI  George Alvarez,  74 y.o. , male was called for HTN follow up.  PCP : Colon Branch, MD  Their chronic conditions include: Asthma, HTN, HLD, CAD, Pre-Diabetes, Allergic Rhinitis, Eczema, GERD, BPH  Office Visits: None since last CCM visit  Consult Visits: 06/06/19: Neuro Visit w/ Dr. Posey Pronto  Medications: Outpatient Encounter Medications as of 07/13/2019  Medication Sig  . albuterol (PROVENTIL HFA;VENTOLIN HFA) 108 (90 Base) MCG/ACT inhaler Inhale 2 puffs into the lungs every 6 (six) hours as needed for wheezing or shortness of breath.  Marland Kitchen aspirin EC 81 MG tablet Take 1 tablet (81 mg total) by mouth daily.  Marland Kitchen azelastine (ASTELIN) 0.1 % nasal spray Place 2 sprays into both nostrils at bedtime as needed for rhinitis or allergies.  . cholecalciferol (VITAMIN D) 1000 units tablet Take 1,000 Units by mouth daily.  . fluticasone (FLONASE) 50 MCG/ACT nasal spray ONE SPRAY EACH NOSTRIL TWICE A Shabree Tebbetts IF NEEDED FOR STUFFY NOSE.  . fluticasone (FLOVENT HFA) 110 MCG/ACT inhaler Inhale 1 puff into the lungs 2 (two) times daily.  . hydrocortisone 2.5 % cream Apply 1 application topically as needed.  Marland Kitchen ketoconazole (NIZORAL) 2 % shampoo Apply 1 application topically as needed for irritation.  . metoprolol tartrate (LOPRESSOR) 25 MG tablet Take 0.5 tablets (12.5 mg total) by mouth 2 (two) times daily.  . montelukast (SINGULAIR) 10 MG tablet TAKE 1 TABLET BY MOUTH EVERYDAY AT BEDTIME  . Multiple Vitamin (MULTIVITAMIN WITH MINERALS) TABS tablet Take 1 tablet by mouth daily.  Marland Kitchen olmesartan-hydrochlorothiazide (BENICAR HCT) 20-12.5 MG tablet Take 1 tablet by mouth daily.  . Omega-3 Fatty Acids (FISH OIL) 1200 MG CAPS Take 1,200 mg by mouth daily.  . pantoprazole (PROTONIX) 40 MG tablet Take 1 tablet (40 mg total) by mouth 2 (two) times daily.  . rosuvastatin (CRESTOR) 40 MG tablet Take 1 tablet  (40 mg total) by mouth every evening.  . tadalafil (CIALIS) 5 MG tablet Take 5 mg by mouth daily as needed for erectile dysfunction.  . tamsulosin (FLOMAX) 0.4 MG CAPS capsule Take 1 capsule (0.4 mg total) by mouth daily.  Marland Kitchen triamcinolone cream (KENALOG) 0.1 % Apply 1 application topically as needed.    No facility-administered encounter medications on file as of 07/13/2019.   Immunization History  Administered Date(s) Administered  . Hepatitis B 01/31/2009, 03/05/2009  . Influenza Split 12/20/2014  . Influenza Whole 12/28/2007, 12/28/2008  . Influenza, High Dose Seasonal PF 01/31/2014, 12/27/2018  . Influenza-Unspecified 01/06/2012, 12/21/2015, 11/15/2016, 12/23/2017  . Pneumococcal Conjugate-13 02/20/2014  . Pneumococcal Polysaccharide-23 01/22/2017  . Pneumococcal-Unspecified 01/06/2012  . Td 01/29/2009  . Tdap 06/05/2013, 03/10/2018  . Zoster 01/06/2012  . Zoster Recombinat (Shingrix) 10/28/2018, 02/26/2019   Received 1st COVID vaccine. Tolerated well. Only had sore arm  Current Diagnosis/Assessment:  Goals Addressed   None   Married. 3 grown daughters and 5 grandkids. [place], [place], [place].    Uses pill box    Hypertension   CMP  CMP Latest Ref Rng & Units 04/12/2019 11/22/2018 02/22/2018  Glucose 70 - 99 mg/dL 114(H) 96 108(H)  BUN 6 - 23 mg/dL 16 17 21   Creatinine 0.40 - 1.50 mg/dL 1.18 1.03 1.16  Sodium 135 - 145 mEq/L 140 140 140  Potassium 3.5 - 5.1 mEq/L 4.2 4.0 4.2  Chloride 96 - 112 mEq/L 103 105 102  CO2 19 - 32 mEq/L  29 24 29   Calcium 8.4 - 10.5 mg/dL 9.7 9.7 9.9  Total Protein 6.0 - 8.3 g/dL 6.2 - 6.6  Total Bilirubin 0.2 - 1.2 mg/dL 0.6 - 0.8  Alkaline Phos 39 - 117 U/L 45 - 52  AST 0 - 37 U/L 21 - 22  ALT 0 - 53 U/L 19 - 24  GFR      60.83   71.24   66.16  BP today is:  Unable to assess due to telephone visit.  Office blood pressures are  BP Readings from Last 3 Encounters:  04/18/19 108/68  04/11/19 92/68  03/25/19 110/62    Patient has failed  these meds in the past: nifedipine (dermatitis, rash, edema), losartan (factory recalled; not failed)  Patient is currently controlled on the following medications: metoprolol 25mg  1/2 tab twice daily, olmesartan-hctz 20-12.5mg  one daily  Patient checks BP at home 1-2x per week  Patient home BP readings are ranging: 120s-130s/70s-80s  Has a BP cuff. Will check BP if he feels lightheaded.   We discussed: Blood Pressure goals (<140/90 also >90/60)  Plan -Check Blood pressure 1-2 times daily and record BP -Continue current medications     Pre-Diabetes   Recent Relevant Labs: Lab Results  Component Value Date/Time   HGBA1C 6.1 04/12/2019 08:01 AM   HGBA1C 5.8 11/04/2018 12:00 AM   HGBA1C 6.0 02/22/2018 10:18 AM   MICROALBUR 0.6 05/15/2006 07:47 AM     Patient is currently controlled, but trending up on the following medications: None  Last diabetic Eye exam:  Lab Results  Component Value Date/Time   HMDIABEYEEXA No Retinopathy 02/17/2017 12:00 AM    Eye Exam: 03/01/19 Last diabetic Foot exam: No results found for: HMDIABFOOTEX   We discussed: diet and exercise extensively  Plan -Practice moderation with carbohydrates -Continue control with diet and exercise

## 2019-07-27 ENCOUNTER — Encounter: Payer: Self-pay | Admitting: Internal Medicine

## 2019-07-29 ENCOUNTER — Encounter: Payer: Self-pay | Admitting: Internal Medicine

## 2019-07-29 ENCOUNTER — Other Ambulatory Visit: Payer: Self-pay

## 2019-07-29 ENCOUNTER — Ambulatory Visit (INDEPENDENT_AMBULATORY_CARE_PROVIDER_SITE_OTHER): Payer: PPO | Admitting: Internal Medicine

## 2019-07-29 VITALS — BP 134/84 | HR 65 | Temp 97.6°F | Resp 18 | Ht 70.0 in | Wt 219.0 lb

## 2019-07-29 DIAGNOSIS — R2689 Other abnormalities of gait and mobility: Secondary | ICD-10-CM

## 2019-07-29 DIAGNOSIS — I10 Essential (primary) hypertension: Secondary | ICD-10-CM

## 2019-07-29 DIAGNOSIS — R600 Localized edema: Secondary | ICD-10-CM | POA: Diagnosis not present

## 2019-07-29 DIAGNOSIS — R399 Unspecified symptoms and signs involving the genitourinary system: Secondary | ICD-10-CM | POA: Diagnosis not present

## 2019-07-29 DIAGNOSIS — N390 Urinary tract infection, site not specified: Secondary | ICD-10-CM

## 2019-07-29 LAB — URINALYSIS, ROUTINE W REFLEX MICROSCOPIC
Bilirubin Urine: NEGATIVE
Hgb urine dipstick: NEGATIVE
Ketones, ur: NEGATIVE
Leukocytes,Ua: NEGATIVE
Nitrite: NEGATIVE
RBC / HPF: NONE SEEN (ref 0–?)
Specific Gravity, Urine: 1.02 (ref 1.000–1.030)
Total Protein, Urine: NEGATIVE
Urine Glucose: NEGATIVE
Urobilinogen, UA: 0.2 (ref 0.0–1.0)
pH: 6 (ref 5.0–8.0)

## 2019-07-29 LAB — POC URINALSYSI DIPSTICK (AUTOMATED)
Bilirubin, UA: NEGATIVE
Blood, UA: NEGATIVE
Glucose, UA: NEGATIVE
Ketones, UA: NEGATIVE
Leukocytes, UA: NEGATIVE
Nitrite, UA: NEGATIVE
Protein, UA: NEGATIVE
Spec Grav, UA: 1.02 (ref 1.010–1.025)
Urobilinogen, UA: 0.2 E.U./dL
pH, UA: 6 (ref 5.0–8.0)

## 2019-07-29 LAB — CBC WITH DIFFERENTIAL/PLATELET
Basophils Absolute: 0 10*3/uL (ref 0.0–0.1)
Basophils Relative: 0.6 % (ref 0.0–3.0)
Eosinophils Absolute: 0.2 10*3/uL (ref 0.0–0.7)
Eosinophils Relative: 3.1 % (ref 0.0–5.0)
HCT: 45.4 % (ref 39.0–52.0)
Hemoglobin: 15.3 g/dL (ref 13.0–17.0)
Lymphocytes Relative: 26.5 % (ref 12.0–46.0)
Lymphs Abs: 1.8 10*3/uL (ref 0.7–4.0)
MCHC: 33.7 g/dL (ref 30.0–36.0)
MCV: 90.2 fl (ref 78.0–100.0)
Monocytes Absolute: 0.7 10*3/uL (ref 0.1–1.0)
Monocytes Relative: 10.5 % (ref 3.0–12.0)
Neutro Abs: 4 10*3/uL (ref 1.4–7.7)
Neutrophils Relative %: 59.3 % (ref 43.0–77.0)
Platelets: 151 10*3/uL (ref 150.0–400.0)
RBC: 5.03 Mil/uL (ref 4.22–5.81)
RDW: 12.8 % (ref 11.5–15.5)
WBC: 6.7 10*3/uL (ref 4.0–10.5)

## 2019-07-29 LAB — COMPREHENSIVE METABOLIC PANEL
ALT: 21 U/L (ref 0–53)
AST: 20 U/L (ref 0–37)
Albumin: 4.7 g/dL (ref 3.5–5.2)
Alkaline Phosphatase: 58 U/L (ref 39–117)
BUN: 21 mg/dL (ref 6–23)
CO2: 25 mEq/L (ref 19–32)
Calcium: 9.7 mg/dL (ref 8.4–10.5)
Chloride: 103 mEq/L (ref 96–112)
Creatinine, Ser: 1.13 mg/dL (ref 0.40–1.50)
GFR: 63.89 mL/min (ref >60.00)
Glucose, Bld: 126 mg/dL — ABNORMAL HIGH (ref 70–99)
Potassium: 4.2 mEq/L (ref 3.5–5.1)
Sodium: 138 mEq/L (ref 135–145)
Total Bilirubin: 0.8 mg/dL (ref 0.2–1.2)
Total Protein: 6.7 g/dL (ref 6.0–8.3)

## 2019-07-29 NOTE — Progress Notes (Signed)
Subjective:    Patient ID: George Alvarez, male    DOB: [birth year], 74 y.o.   MRN: [id number]  DOS:  07/29/2019 Type of visit - description: Acute Went to see neurosurgery due to back pain, he started on ibuprofen for 2 weeks at the beginning of the month, approximately 2400 mg daily. At the end of the 2 weeks he develop a number of symptoms: Nausea, urinary frequency, cloudy urine. Back pain continue.  He went all the way to [place] to visit his mother and the symptoms gradually decreased.  He now feels well.  Note from neurology reviewed  Had the Moderna Covid injection x 2  last month  Review of Systems No fever chills No generalized rash No vomiting, diarrhea or blood in the stools No dysuria or gross hematuria.  Past Medical History:  Diagnosis Date  . Anal fissure   . Arthritis   . Asthma, chronic 06/16/2007   Qualifier: Diagnosis of  By: Linna Darner MD, Gwyndolyn Saxon   Onset:as child Triggers (environmental, infectious, allergic): all, mainly environmental triggers Rescue inhaler [id number] Maintenance medications/ response:Singulair,Qvar Smoking history:never Family history pulmonary disease: no    . Basal cell carcinoma of chest wall 2016   1.8 cm, treated with electrodessication and currettage  . Benign paroxysmal positional vertigo 11/19/2012   Diagnosed at [facility], [facility]  Physical therapy appointment pending   . BPH (benign prostatic hyperplasia)    With urinary obstruction  . Coronary artery disease    a. 02/2012 Cath/PCI: LM 10, LAD min irregs, LCX large, OM1 sm, 40 ost, OM2 95p (5.0x16 Veriflex & 4.5x12 Veriflex BMS'), RCA 30p, 30m, 30d, PDA/PLA min irregs, EF 65%  . Diverticulosis   . Erectile dysfunction due to arterial insufficiency   . Fundic gland polyps of stomach, benign 2010  . GERD (gastroesophageal reflux disease)   . History of elevated PSA   . History of kidney stones   . Hyperlipidemia   . Hypertension   . Nocturia   . Perennial allergic  rhinitis   . Peyronie's disease   . Skin cancer    Basal and squamous cell cancers, greater than 20  . Stroke ([place]) 2016  . Tubular adenoma of colon 2010    Past Surgical History:  Procedure Laterality Date  . CORONARY ANGIOPLASTY WITH STENT PLACEMENT  02/06/2012   OM2  bare metal   . EPIDURAL BLOCK INJECTION  04-2015   Dr Brien Few  . epidural steroids  2007, 2009   X 2 @ cervical &, lumbar)  . HERNIA REPAIR    . INTRAVASCULAR ULTRASOUND  02/06/2012   Procedure: INTRAVASCULAR ULTRASOUND;  Surgeon: [name], MD;  Location: [facility] CATH LAB;  Service: Cardiovascular;;  . LEFT HEART CATHETERIZATION WITH CORONARY ANGIOGRAM N/A 02/06/2012   Procedure: LEFT HEART CATHETERIZATION WITH CORONARY ANGIOGRAM;  Surgeon: [name], MD;  Location: [facility] CATH LAB;  Service: Cardiovascular;  Laterality: N/A;  . LUMBAR LAMINECTOMY/DECOMPRESSION MICRODISCECTOMY N/A 10/21/2017   Procedure: Lumbar Two-Three, Lumbar Four-Five Discectomy;  Surgeon: Ashok Pall, MD;  Location: [place];  Service: Neurosurgery;  Laterality: N/A;  . PERCUTANEOUS CORONARY STENT INTERVENTION (PCI-S)  02/06/2012   Procedure: PERCUTANEOUS CORONARY STENT INTERVENTION (PCI-S);  Surgeon: [name], MD;  Location: [facility] CATH LAB;  Service: Cardiovascular;;  . septoplasty    . TONSILLECTOMY AND ADENOIDECTOMY    . TRIGGER FINGER RELEASE    . UPPER GASTROINTESTINAL ENDOSCOPY  2011   gastric polyps  . VASECTOMY  Allergies as of 07/29/2019      Reactions   Nifedipine Dermatitis, Rash   edema      Medication List       Accurate as of July 29, 2019 11:59 PM. If you have any questions, ask your nurse or doctor.        albuterol 108 (90 Base) MCG/ACT inhaler Commonly known as: VENTOLIN HFA Inhale 2 puffs into the lungs every 6 (six) hours as needed for wheezing or shortness of breath.   aspirin EC 81 MG tablet Take 1 tablet (81 mg total) by mouth daily.   azelastine 0.1 % nasal spray Commonly  known as: ASTELIN Place 2 sprays into both nostrils at bedtime as needed for rhinitis or allergies.   cholecalciferol 1000 units tablet Commonly known as: VITAMIN D Take 1,000 Units by mouth daily.   Fish Oil 1200 MG Caps Take 1,200 mg by mouth daily.   fluticasone 110 MCG/ACT inhaler Commonly known as: FLOVENT HFA Inhale 1 puff into the lungs 2 (two) times daily.   fluticasone 50 MCG/ACT nasal spray Commonly known as: FLONASE ONE SPRAY EACH NOSTRIL TWICE A DAY IF NEEDED FOR STUFFY NOSE.   hydrocortisone 2.5 % cream Apply 1 application topically as needed.   ketoconazole 2 % shampoo Commonly known as: NIZORAL Apply 1 application topically as needed for irritation.   metoprolol tartrate 25 MG tablet Commonly known as: LOPRESSOR Take 0.5 tablets (12.5 mg total) by mouth 2 (two) times daily.   montelukast 10 MG tablet Commonly known as: SINGULAIR TAKE 1 TABLET BY MOUTH EVERYDAY AT BEDTIME   multivitamin with minerals Tabs tablet Take 1 tablet by mouth daily.   olmesartan-hydrochlorothiazide 20-12.5 MG tablet Commonly known as: BENICAR HCT Take 1 tablet by mouth daily.   pantoprazole 40 MG tablet Commonly known as: PROTONIX Take 1 tablet (40 mg total) by mouth 2 (two) times daily.   rosuvastatin 40 MG tablet Commonly known as: CRESTOR Take 1 tablet (40 mg total) by mouth every evening.   tadalafil 5 MG tablet Commonly known as: CIALIS Take 5 mg by mouth daily as needed for erectile dysfunction.   tamsulosin 0.4 MG Caps capsule Commonly known as: FLOMAX Take 1 capsule (0.4 mg total) by mouth daily.   triamcinolone cream 0.1 % Commonly known as: KENALOG Apply 1 application topically as needed.          Objective:   Physical Exam BP 134/84 (BP Location: Left Arm, Patient Position: Sitting, Cuff Size: Normal)   Pulse 65   Temp 97.6 F (36.4 C) (Temporal)   Resp 18   Ht 5\' 10"  (1.778 m)   Wt 219 lb (99.3 kg)   SpO2 96%   BMI 31.42 kg/m   General:     Well developed, NAD, BMI noted.  HEENT:  Normocephalic . Face symmetric, atraumatic Lungs:  CTA B Normal respiratory effort, no intercostal retractions, no accessory muscle use. Heart: RRR,  no murmur.  Abdomen:  Not distended, soft, non-tender. No rebound or rigidity.   Skin: Not pale. Not jaundice Lower extremities: no pretibial edema bilaterally  Neurologic:  alert & oriented X3.  Speech normal, gait appropriate for age and unassisted Psych--  Cognition and judgment appear intact.  Cooperative with normal attention span and concentration.  Behavior appropriate. No anxious or depressed appearing.     Assessment    Assessment  Prediabetes HTN Hyperlipidemia CV: Dr Angelena Form --CAD --Stroke, seen in a MRI Asthma- Dr Darcey Nora  MSK --DJD knees used to see  Dr Len Childs 2016, local injections --Spinal stenosis (neck-back) - Dr Cyndy Freeze dx ~ 2008 via MRI neck, back, had local injections 2008; then 04-2015 GERD BPV, off balance (seen by neuro 2020)  GU: --Elevated PSA -- Dr Jeffie Pollock --Peyronie's  Disease Skin cancer : [facility] Dr Susie Cassette   PLAN: Edema, L UTS: As above, in the context of taking high doses NSAIDs for 2 weeks, finished NSAIDs early this month. Udip (-) Symptoms are now resolved but will check his kidney function. We will also check a UA urine culture for completeness HTN: Continue metoprolol, Benicar HCT.  Check a CMP, CBC.  In the future I strongly recommend to avoid high doses of NSAIDs and always keep good hydration Off balance, gait instability: Saw neurology 06-2019, felt to be due to lumbosacral spondylosis and spinal canal stenosis, improved with physical therapy. Back pain: Continue, plans to see neurosurgery again for consideration of an MRI RTC scheduled for June 2021 CPX   This visit occurred during the SARS-CoV-2 public health emergency.  Safety protocols were in place, including screening questions prior to the visit, additional usage of staff PPE, and  extensive cleaning of exam room while observing appropriate contact time as indicated for disinfecting solutions.

## 2019-07-29 NOTE — Patient Instructions (Addendum)
Check your blood pressure once or twice a month BP GOAL is between 110/65 and  135/85. If it is consistently higher or lower, let me know   Drink plenty of fluids  If any of your symptoms return let me know  GO TO THE LAB : Get the blood work    See you in June for your physical exam

## 2019-07-29 NOTE — Progress Notes (Signed)
Pre visit review using our clinic review tool, if applicable. No additional management support is needed unless otherwise documented below in the visit note. 

## 2019-07-30 LAB — URINE CULTURE
MICRO NUMBER:: 10399403
Result:: NO GROWTH
SPECIMEN QUALITY:: ADEQUATE

## 2019-07-31 NOTE — Assessment & Plan Note (Signed)
Edema, L UTS: As above, in the context of taking high doses NSAIDs for 2 weeks, finished NSAIDs early this month. Udip (-) Symptoms are now resolved but will check his kidney function. We will also check a UA urine culture for completeness HTN: Continue metoprolol, Benicar HCT.  Check a CMP, CBC.  In the future I strongly recommend to avoid high doses of NSAIDs and always keep good hydration Off balance, gait instability: Saw neurology 06-2019, felt to be due to lumbosacral spondylosis and spinal canal stenosis, improved with physical therapy. Back pain: Continue, plans to see neurosurgery again for consideration of an MRI RTC scheduled for June 2021 CPX

## 2019-08-16 ENCOUNTER — Other Ambulatory Visit (HOSPITAL_BASED_OUTPATIENT_CLINIC_OR_DEPARTMENT_OTHER): Payer: Self-pay | Admitting: Neurosurgery

## 2019-08-16 DIAGNOSIS — M48062 Spinal stenosis, lumbar region with neurogenic claudication: Secondary | ICD-10-CM

## 2019-08-27 ENCOUNTER — Other Ambulatory Visit: Payer: Self-pay

## 2019-08-27 ENCOUNTER — Ambulatory Visit (HOSPITAL_BASED_OUTPATIENT_CLINIC_OR_DEPARTMENT_OTHER)
Admission: RE | Admit: 2019-08-27 | Discharge: 2019-08-27 | Disposition: A | Discharge status: Home / Self Care | Payer: PPO | Source: Ambulatory Visit | Attending: Neurosurgery | Admitting: Neurosurgery

## 2019-08-27 DIAGNOSIS — M4807 Spinal stenosis, lumbosacral region: Secondary | ICD-10-CM | POA: Diagnosis not present

## 2019-08-27 DIAGNOSIS — M48062 Spinal stenosis, lumbar region with neurogenic claudication: Secondary | ICD-10-CM | POA: Diagnosis not present

## 2019-08-27 DIAGNOSIS — M5126 Other intervertebral disc displacement, lumbar region: Secondary | ICD-10-CM | POA: Diagnosis not present

## 2019-08-27 MED ORDER — GADOBUTROL 1 MMOL/ML IV SOLN
10.0000 mL | Freq: Once | INTRAVENOUS | Status: AC | PRN
  Administered 2019-08-27: 10 mL via INTRAVENOUS

## 2019-09-14 ENCOUNTER — Other Ambulatory Visit: Payer: Self-pay | Admitting: Pediatrics

## 2019-09-16 ENCOUNTER — Other Ambulatory Visit: Payer: Self-pay | Admitting: Internal Medicine

## 2019-09-19 DIAGNOSIS — M5126 Other intervertebral disc displacement, lumbar region: Secondary | ICD-10-CM | POA: Diagnosis not present

## 2019-09-19 DIAGNOSIS — I10 Essential (primary) hypertension: Secondary | ICD-10-CM | POA: Diagnosis not present

## 2019-09-19 DIAGNOSIS — Z6831 Body mass index (BMI) 31.0-31.9, adult: Secondary | ICD-10-CM | POA: Diagnosis not present

## 2019-09-19 DIAGNOSIS — M48062 Spinal stenosis, lumbar region with neurogenic claudication: Secondary | ICD-10-CM | POA: Diagnosis not present

## 2019-09-23 ENCOUNTER — Encounter: Payer: Self-pay | Admitting: Internal Medicine

## 2019-09-23 ENCOUNTER — Ambulatory Visit (INDEPENDENT_AMBULATORY_CARE_PROVIDER_SITE_OTHER): Payer: PPO | Admitting: Internal Medicine

## 2019-09-23 ENCOUNTER — Other Ambulatory Visit: Payer: Self-pay

## 2019-09-23 VITALS — BP 105/70 | HR 70 | Temp 97.8°F | Resp 18 | Ht 70.0 in | Wt 218.2 lb

## 2019-09-23 DIAGNOSIS — R079 Chest pain, unspecified: Secondary | ICD-10-CM

## 2019-09-23 DIAGNOSIS — M4800 Spinal stenosis, site unspecified: Secondary | ICD-10-CM | POA: Diagnosis not present

## 2019-09-23 DIAGNOSIS — G4452 New daily persistent headache (NDPH): Secondary | ICD-10-CM | POA: Diagnosis not present

## 2019-09-23 DIAGNOSIS — R739 Hyperglycemia, unspecified: Secondary | ICD-10-CM

## 2019-09-23 LAB — CBC WITH DIFFERENTIAL/PLATELET
Basophils Absolute: 0 10*3/uL (ref 0.0–0.1)
Basophils Relative: 0.5 % (ref 0.0–3.0)
Eosinophils Absolute: 0.1 10*3/uL (ref 0.0–0.7)
Eosinophils Relative: 1.3 % (ref 0.0–5.0)
HCT: 42.5 % (ref 39.0–52.0)
Hemoglobin: 14.6 g/dL (ref 13.0–17.0)
Lymphocytes Relative: 18.1 % (ref 12.0–46.0)
Lymphs Abs: 1.6 10*3/uL (ref 0.7–4.0)
MCHC: 34.3 g/dL (ref 30.0–36.0)
MCV: 89.7 fl (ref 78.0–100.0)
Monocytes Absolute: 0.6 10*3/uL (ref 0.1–1.0)
Monocytes Relative: 6.7 % (ref 3.0–12.0)
Neutro Abs: 6.6 10*3/uL (ref 1.4–7.7)
Neutrophils Relative %: 73.4 % (ref 43.0–77.0)
Platelets: 141 10*3/uL — ABNORMAL LOW (ref 150.0–400.0)
RBC: 4.74 Mil/uL (ref 4.22–5.81)
RDW: 13.2 % (ref 11.5–15.5)
WBC: 8.9 10*3/uL (ref 4.0–10.5)

## 2019-09-23 LAB — SEDIMENTATION RATE: Sed Rate: 8 mm/hr (ref 0–20)

## 2019-09-23 LAB — BASIC METABOLIC PANEL
BUN: 23 mg/dL (ref 6–23)
CO2: 27 mEq/L (ref 19–32)
Calcium: 9.7 mg/dL (ref 8.4–10.5)
Chloride: 104 mEq/L (ref 96–112)
Creatinine, Ser: 1.27 mg/dL (ref 0.40–1.50)
GFR: 55.81 mL/min — ABNORMAL LOW (ref >60.00)
Glucose, Bld: 99 mg/dL (ref 70–99)
Potassium: 4.1 mEq/L (ref 3.5–5.1)
Sodium: 138 mEq/L (ref 135–145)

## 2019-09-23 LAB — D-DIMER, QUANTITATIVE: D-Dimer, Quant: <0.19 mcg/mL FEU (ref <0.50)

## 2019-09-23 LAB — HEMOGLOBIN A1C: Hgb A1c MFr Bld: 6.1 % (ref 4.6–6.5)

## 2019-09-23 MED ORDER — NITROGLYCERIN 0.4 MG SL SUBL: 0.4000 mg | SUBLINGUAL_TABLET | SUBLINGUAL | 2 refills | Status: DC | PRN

## 2019-09-23 NOTE — Progress Notes (Signed)
Subjective:    Patient ID: George Alvarez, male    DOB: 1945-09-29, 74 y.o.   MRN: [id number]  DOS:  09/23/2019 Type of visit - description: Routine checkup Edema,  UTS: See last visit, resolved Ambulatory BPs very good  He also has 2 other concerns:  He flew back from [place] 10 days ago, at that time he had a URI with some cough. He is also experiencing a substernal heaviness on and off. Typically at night for 30 minutes, at rest but also exertional. It is associated with some shortness of breath. No associated nausea, vomiting, diaphoresis.  Also, has very sharp pain that goes from the right eye to the back of the head, once daily for the last month. Denies visual disturbances specifically no amaurosis fugax No neck pain present No scalp numbness No nausea or vomiting.    Review of Systems Denies calf pain or swelling since he flew back from [place].   Past Medical History:  Diagnosis Date  . Anal fissure   . Arthritis   . Asthma, chronic 06/16/2007   Qualifier: Diagnosis of  By: Linna Darner MD, Gwyndolyn Saxon   Onset:as child Triggers (environmental, infectious, allergic): all, mainly environmental triggers Rescue inhaler VHQ:IONGEX Maintenance medications/ response:Singulair,Qvar Smoking history:never Family history pulmonary disease: no    . Basal cell carcinoma of chest wall 2016   1.8 cm, treated with electrodessication and currettage  . Benign paroxysmal positional vertigo 11/19/2012   Diagnosed at [facility], [facility]  Physical therapy appointment pending   . BPH (benign prostatic hyperplasia)    With urinary obstruction  . Coronary artery disease    a. 02/2012 Cath/PCI: LM 10, LAD min irregs, LCX large, OM1 sm, 40 ost, OM2 95p (5.0x16 Veriflex & 4.5x12 Veriflex BMS'), RCA 30p, 42m, 30d, PDA/PLA min irregs, EF 65%  . Diverticulosis   . Erectile dysfunction due to arterial insufficiency   . Fundic gland polyps of stomach, benign 2010  . GERD (gastroesophageal  reflux disease)   . History of elevated PSA   . History of kidney stones   . Hyperlipidemia   . Hypertension   . Nocturia   . Perennial allergic rhinitis   . Peyronie's disease   . Skin cancer    Basal and squamous cell cancers, greater than 20  . Stroke ([place]) 2016  . Tubular adenoma of colon 2010    Past Surgical History:  Procedure Laterality Date  . CORONARY ANGIOPLASTY WITH STENT PLACEMENT  02/06/2012   OM2  bare metal   . EPIDURAL BLOCK INJECTION  04-2015   Dr Brien Few  . epidural steroids  2007, 2009   X 2 @ cervical &, lumbar)  . HERNIA REPAIR    . INTRAVASCULAR ULTRASOUND  02/06/2012   Procedure: INTRAVASCULAR ULTRASOUND;  Surgeon: [name], MD;  Location: [facility] CATH LAB;  Service: Cardiovascular;;  . LEFT HEART CATHETERIZATION WITH CORONARY ANGIOGRAM N/A 02/06/2012   Procedure: LEFT HEART CATHETERIZATION WITH CORONARY ANGIOGRAM;  Surgeon: [name], MD;  Location: [facility] CATH LAB;  Service: Cardiovascular;  Laterality: N/A;  . LUMBAR LAMINECTOMY/DECOMPRESSION MICRODISCECTOMY N/A 10/21/2017   Procedure: Lumbar Two-Three, Lumbar Four-Five Discectomy;  Surgeon: Ashok Pall, MD;  Location: [place];  Service: Neurosurgery;  Laterality: N/A;  . PERCUTANEOUS CORONARY STENT INTERVENTION (PCI-S)  02/06/2012   Procedure: PERCUTANEOUS CORONARY STENT INTERVENTION (PCI-S);  Surgeon: [name], MD;  Location: [facility] CATH LAB;  Service: Cardiovascular;;  . septoplasty    . TONSILLECTOMY AND ADENOIDECTOMY    .  TRIGGER FINGER RELEASE    . UPPER GASTROINTESTINAL ENDOSCOPY  2011   gastric polyps  . VASECTOMY      Allergies as of 09/23/2019      Reactions   Nifedipine Dermatitis, Rash   edema      Medication List       Accurate as of September 23, 2019 11:59 PM. If you have any questions, ask your nurse or doctor.        albuterol 108 (90 Base) MCG/ACT inhaler Commonly known as: VENTOLIN HFA Inhale 2 puffs into the lungs every 6 (six) hours as needed for  wheezing or shortness of breath.   aspirin EC 81 MG tablet Take 1 tablet (81 mg total) by mouth daily.   azelastine 0.1 % nasal spray Commonly known as: ASTELIN Place 2 sprays into both nostrils at bedtime as needed for rhinitis or allergies.   cholecalciferol 1000 units tablet Commonly known as: VITAMIN D Take 1,000 Units by mouth daily.   Fish Oil 1200 MG Caps Take 1,200 mg by mouth daily.   fluticasone 110 MCG/ACT inhaler Commonly known as: FLOVENT HFA Inhale 1 puff into the lungs 2 (two) times daily.   fluticasone 50 MCG/ACT nasal spray Commonly known as: FLONASE ONE SPRAY EACH NOSTRIL TWICE A DAY IF NEEDED FOR STUFFY NOSE.   hydrocortisone 2.5 % cream Apply 1 application topically as needed.   ketoconazole 2 % shampoo Commonly known as: NIZORAL Apply 1 application topically as needed for irritation.   metoprolol tartrate 25 MG tablet Commonly known as: LOPRESSOR Take 0.5 tablets (12.5 mg total) by mouth 2 (two) times daily.   montelukast 10 MG tablet Commonly known as: SINGULAIR TAKE 1 TABLET BY MOUTH EVERYDAY AT BEDTIME   multivitamin with minerals Tabs tablet Take 1 tablet by mouth daily.   nitroGLYCERIN 0.4 MG SL tablet Commonly known as: NITROSTAT Place 1 tablet (0.4 mg total) under the tongue every 5 (five) minutes x 3 doses as needed for chest pain. Started by: Kathlene November, MD   olmesartan-hydrochlorothiazide 20-12.5 MG tablet Commonly known as: BENICAR HCT Take 1 tablet by mouth daily.   pantoprazole 40 MG tablet Commonly known as: PROTONIX Take 1 tablet (40 mg total) by mouth 2 (two) times daily.   rosuvastatin 40 MG tablet Commonly known as: CRESTOR Take 1 tablet (40 mg total) by mouth every evening.   tadalafil 5 MG tablet Commonly known as: CIALIS Take 5 mg by mouth daily as needed for erectile dysfunction.   tamsulosin 0.4 MG Caps capsule Commonly known as: FLOMAX Take 1 capsule (0.4 mg total) by mouth daily.   triamcinolone cream 0.1  % Commonly known as: KENALOG Apply 1 application topically as needed.          Objective:   Physical Exam BP 105/70 (BP Location: Left Arm, Patient Position: Sitting, Cuff Size: Normal)   Pulse 70   Temp 97.8 F (36.6 C) (Temporal)   Resp 18   Ht 5\' 10"  (1.778 m)   Wt 218 lb 4 oz (99 kg)   SpO2 93%   BMI 31.32 kg/m   General:   Well developed, NAD, BMI noted. HEENT:  Normocephalic . Face symmetric, atraumatic. EOMI.  Pupils equal, reactive. Temples: Palpable temporal artery, no TTP Lungs:  CTA B Normal respiratory effort, no intercostal retractions, no accessory muscle use. Heart: RRR,  no murmur.  Lower extremities: no pretibial edema bilaterally  Skin: Not pale. Not jaundice Neurologic:  alert & oriented X3.  Speech normal, gait  appropriate for age and unassisted. Motor symmetric. Psych--  Cognition and judgment appear intact.  Cooperative with normal attention span and concentration.  Behavior appropriate. No anxious or depressed appearing.      Assessment      Assessment  Prediabetes HTN Hyperlipidemia CV: Dr Angelena Form --CAD --Stroke, seen in a MRI Asthma- Dr Darcey Nora  MSK --DJD knees used to see Dr Len Childs 2016, local injections --Spinal stenosis (neck-back) - Dr Cyndy Freeze dx ~ 2008 via MRI neck, back, had local injections 2008; then 04-2015 GERD BPV, off balance (seen by neuro 2020)  GU: --Elevated PSA -- Dr Jeffie Pollock --Peyronie's  Disease Skin cancer : [facility] Dr Susie Cassette   PLAN: Prediabetes: Check A1c Edema, L UTS: See last visit, resolved after he stopped NSAIDs. HTN: Continue  Metoprolol and Benicar.  Ambulatory BPs in the 130s over 80s.  Check BMP Back pain: Recent MRI showing DJD, spinal stenosis, already talk with neurosurgery, plan is to stay active, yoga, stretching, occasional Tylenol. CAD: Has been doing very well however recently flew from [place] and is experiencing on and off substernal heaviness and shortness of breath.  He also is  recovering from a URI and  has a history of asthma. EKG today: NSR, no acute, no major changes compared to previous EKG. Symptoms could be related to URI in the context of asthma versus CAD versus PE given recent travel history. Plan: ER symptoms severe, stat D-dimer, NTG sent, refer to cardiology. Headache: As described above: Check a sed rate, CBC.  Reassess on RTC RTC 3 weeks  In addition to routine care, I addressed to acute symptoms: Chest pain, headaches.  Time spent 45 minutes  This visit occurred during the SARS-CoV-2 public health emergency.  Safety protocols were in place, including screening questions prior to the visit, additional usage of staff PPE, and extensive cleaning of exam room while observing appropriate contact time as indicated for disinfecting solutions.

## 2019-09-23 NOTE — Progress Notes (Signed)
Pre visit review using our clinic review tool, if applicable. No additional management support is needed unless otherwise documented below in the visit note. 

## 2019-09-23 NOTE — Patient Instructions (Addendum)
Please schedule Medicare Wellness with Glenard Haring.    Take nitroglycerin every 5 minutes x 3 if chest pain.   If is not resolved or if  you have severe or prolonged episode of chest pain: Call 911  Cannot mix nitroglycerin with Cialis, please be careful  If the D-dimer is positive, we will call you and direct you to the emergency room   We are doing additional labs to see about the headache, if you have any visual disturbance or severe headache let me know.   GO TO THE LAB : Get the blood work     [place], [place] back for a checkup in 3 weeks

## 2019-09-25 NOTE — Assessment & Plan Note (Signed)
Prediabetes: Check A1c Edema, L UTS: See last visit, resolved after he stopped NSAIDs. HTN: Continue  Metoprolol and Benicar.  Ambulatory BPs in the 130s over 80s.  Check BMP Back pain: Recent MRI showing DJD, spinal stenosis, already talk with neurosurgery, plan is to stay active, yoga, stretching, occasional Tylenol. CAD: Has been doing very well however recently flew from [place] and is experiencing on and off substernal heaviness and shortness of breath.  He also is recovering from a URI and  has a history of asthma. EKG today: NSR, no acute, no major changes compared to previous EKG. Symptoms could be related to URI in the context of asthma versus CAD versus PE given recent travel history. Plan: ER symptoms severe, stat D-dimer, NTG sent, refer to cardiology. Headache: As described above: Check a sed rate, CBC.  Reassess on RTC RTC 3 weeks

## 2019-09-26 NOTE — Progress Notes (Addendum)
CARDIOLOGY OFFICE NOTE  Date:  09/28/2019    George Alvarez [facility] Date of Birth: 21-Dec-1945 Medical Record #[id number]  PCP:  George Branch, MD  Cardiologist:  [facility]  Chief Complaint  Patient presents with  . Chest Pain    History of Present Illness: George Alvarez is a 74 y.o. male who presents today for a work in visit. Seen for Dr. Angelena Alvarez.   He has a history of HTN, HLD, PVCs and CAD. Cardiac cath on 02/06/12 with 95% proximal OM2 lesion and a 60% mid RCA lesion. EF was 65%. His OM2 was very large in caliber and was treated with bare-metal stents x 2.  Medical management of RCA stenosis which was felt to be moderate. He has chronic dizziness and is felt to have vertigo. Normal stress myoview December 2015. He has been limited by chronic back pain due to spinal stenosis but following back surgery in July 2019 his pain improved. He been noted to be ASA intolerant in the past but recently had ASA testing in primary care and tolerated it well.   Last seen by Dr. Angelena Alvarez in January and was felt to be doing well - wished to stop Plavix and start ASA since he had had recent normal ASA testing and was without issues. Echo was updated - noted dilated aorta and CTA was done - no thoracic aneurysm noted.   Saw his PCP last week - endorsed chest pain at that visit - EKG noted. Had recently flew from [place] and was having on and off chest heaviness with SOB. Was recovering from URI - has history of asthma. D dimer was negative. They were there baby sitting - the kids got sick with cough and high fevers - kids were negative COVID.   The patient does not have symptoms concerning for COVID-19 infection (fever, chills, cough, or new shortness of breath).   Comes in today. Here alone. He has been vaccinated. His appointment last week had already been scheduled with PCP - noted still with some tightness in his chest - different from his asthma. Little short of breath. He has noted some shortness of  breath with exertion over the past several weeks. The tightness seemed to have started while in [place] and has persisted since coming home over the past Alvarez weeks.  He has tried to continue to do his daily walking - this has gotten harder. He notes that he is more short of breath with this. He has continued to have tightness - more of a pressure sensation - can last up to 30 minutes at a time - with exertion. No actual pain. He has not used NTG - never has. He was hoping this could be his asthma but this does act like his asthma and he admits that it feels "different". He does not remember how he felt when he had his stents.   Past Medical History:  Diagnosis Date  . Anal fissure   . Arthritis   . Asthma, chronic 06/16/2007   Qualifier: Diagnosis of  By: Linna Darner MD, Gwyndolyn Saxon   Onset:as child Triggers (environmental, infectious, allergic): all, mainly environmental triggers Rescue inhaler NID:[id number] Maintenance medications/ response:Singulair,Qvar Smoking history:never Family history pulmonary disease: no    . Basal cell carcinoma of chest wall 2016   1.8 cm, treated with electrodessication and currettage  . Benign paroxysmal positional vertigo 11/19/2012   Diagnosed at [facility], [facility]  Physical therapy appointment pending   . BPH (benign prostatic  hyperplasia)    With urinary obstruction  . Coronary artery disease    a. 02/2012 Cath/PCI: LM 10, LAD min irregs, LCX large, OM1 sm, 40 ost, OM2 95p (5.0x16 Veriflex & 4.5x12 Veriflex BMS'), RCA 30p, 69m, 30d, PDA/PLA min irregs, EF 65%  . Diverticulosis   . Erectile dysfunction due to arterial insufficiency   . Fundic gland polyps of stomach, benign 2010  . GERD (gastroesophageal reflux disease)   . History of elevated PSA   . History of kidney stones   . Hyperlipidemia   . Hypertension   . Nocturia   . Perennial allergic rhinitis   . Peyronie's disease   . Skin cancer    Basal and squamous cell cancers, greater than 20  . Stroke ([place])  2016  . Tubular adenoma of George 2010    Past Surgical History:  Procedure Laterality Date  . CORONARY ANGIOPLASTY WITH STENT PLACEMENT  02/06/2012   OM2  bare metal   . EPIDURAL BLOCK INJECTION  04-2015   Dr George Alvarez  . epidural steroids  2007, 2009   X 2 @ cervical &, lumbar)  . HERNIA REPAIR    . INTRAVASCULAR ULTRASOUND  02/06/2012   Procedure: INTRAVASCULAR ULTRASOUND;  Surgeon: George Blanks, MD;  Location: [facility] CATH LAB;  Service: Cardiovascular;;  . LEFT HEART CATHETERIZATION WITH CORONARY ANGIOGRAM N/A 02/06/2012   Procedure: LEFT HEART CATHETERIZATION WITH CORONARY ANGIOGRAM;  Surgeon: George Blanks, MD;  Location: [facility] CATH LAB;  Service: Cardiovascular;  Laterality: N/A;  . LUMBAR LAMINECTOMY/DECOMPRESSION MICRODISCECTOMY N/A 10/21/2017   Procedure: Lumbar Two-Three, Lumbar Four-Five Discectomy;  Surgeon: George Pall, MD;  Location: [facility];  Service: Neurosurgery;  Laterality: N/A;  . PERCUTANEOUS CORONARY STENT INTERVENTION (PCI-S)  02/06/2012   Procedure: PERCUTANEOUS CORONARY STENT INTERVENTION (PCI-S);  Surgeon: George Blanks, MD;  Location: [facility] CATH LAB;  Service: Cardiovascular;;  . septoplasty    . TONSILLECTOMY AND ADENOIDECTOMY    . TRIGGER FINGER RELEASE    . UPPER GASTROINTESTINAL ENDOSCOPY  2011   gastric polyps  . VASECTOMY       Medications: Current Meds  Medication Sig  . albuterol (PROVENTIL HFA;VENTOLIN HFA) 108 (90 Base) MCG/ACT inhaler Inhale 2 puffs into the lungs every 6 (six) hours as needed for wheezing or shortness of breath.  Marland Kitchen aspirin EC 81 MG tablet Take 1 tablet (81 mg total) by mouth daily.  Marland Kitchen azelastine (ASTELIN) 0.1 % nasal spray Place 2 sprays into both nostrils at bedtime as needed for rhinitis or allergies.  . cholecalciferol (VITAMIN D) 1000 units tablet Take 1,000 Units by mouth daily.  . fluticasone (FLONASE) 50 MCG/ACT nasal spray ONE SPRAY EACH NOSTRIL TWICE A DAY IF NEEDED FOR STUFFY NOSE.  . fluticasone  (FLOVENT HFA) 110 MCG/ACT inhaler Inhale 1 puff into the lungs 2 (two) times daily.  . hydrocortisone 2.5 % cream Apply 1 application topically as needed.  Marland Kitchen ketoconazole (NIZORAL) 2 % shampoo Apply 1 application topically as needed for irritation.  . metoprolol tartrate (LOPRESSOR) 25 MG tablet Take 0.5 tablets (12.5 mg total) by mouth 2 (two) times daily.  . montelukast (SINGULAIR) 10 MG tablet TAKE 1 TABLET BY MOUTH EVERYDAY AT BEDTIME  . Multiple Vitamin (MULTIVITAMIN WITH MINERALS) TABS tablet Take 1 tablet by mouth daily.  . nitroGLYCERIN (NITROSTAT) 0.4 MG SL tablet Place 1 tablet (0.4 mg total) under the tongue every 5 (five) minutes x 3 doses as needed for chest pain.  Marland Kitchen olmesartan-hydrochlorothiazide (BENICAR HCT) 20-12.5 MG tablet Take  1 tablet by mouth daily.  . Omega-3 Fatty Acids (FISH OIL) 1200 MG CAPS Take 1,200 mg by mouth daily.  . pantoprazole (PROTONIX) 40 MG tablet Take 1 tablet (40 mg total) by mouth 2 (two) times daily.  . rosuvastatin (CRESTOR) 40 MG tablet Take 1 tablet (40 mg total) by mouth every evening.  . tadalafil (CIALIS) 5 MG tablet Take 5 mg by mouth daily as needed for erectile dysfunction.  . tamsulosin (FLOMAX) 0.4 MG CAPS capsule Take 1 capsule (0.4 mg total) by mouth daily.  Marland Kitchen triamcinolone cream (KENALOG) 0.1 % Apply 1 application topically as needed.      Allergies: Allergies  Allergen Reactions  . Nifedipine Dermatitis and Rash    edema    Social History: The patient  reports that he has never smoked. He has never used smokeless tobacco. He reports current alcohol use. He reports that he does not use drugs.   Family History: The patient's family history includes Breast cancer in his maternal aunt, maternal grandmother, and mother; Dementia in his maternal grandmother; Diabetes in his maternal grandmother; Esophageal cancer in his mother; Heart attack in his maternal grandmother; Heart attack (age of onset: 1) in his father; Heart attack (age of  onset: 34) in his paternal uncle; Heart disease in his mother; Stroke in his maternal grandmother.   Review of Systems: Please see the history of present illness.   All other systems are reviewed and negative.   Physical Exam: VS:  BP 120/82   Pulse (!) 59   Ht 5\' 10"  (1.778 m)   Wt 218 lb 12.8 oz (99.2 kg)   SpO2 98%   BMI 31.39 kg/m  .  BMI Body mass index is 31.39 kg/m.  Wt Readings from Last 3 Encounters:  09/28/19 218 lb 12.8 oz (99.2 kg)  09/23/19 218 lb 4 oz (99 kg)  07/29/19 219 lb (99.3 kg)    General: Pleasant. Alert and in no acute distress.   HEENT: Normal.  Neck: Supple, no JVD, carotid bruits, or masses noted.  Cardiac: Regular rate and rhythm. No murmurs, rubs, or gallops. No edema.  Respiratory:  Lungs are clear to auscultation bilaterally with normal work of breathing.  GI: Soft and nontender.  MS: No deformity or atrophy. Gait and ROM intact.  Skin: Warm and dry. Color is normal.  Neuro:  Strength and sensation are intact and no gross focal deficits noted.  Psych: Alert, appropriate and with normal affect.   LABORATORY DATA:  EKG:  EKG is not ordered today.  I have reviewed last week's tracing.   Lab Results  Component Value Date   WBC 8.9 09/23/2019   HGB 14.6 09/23/2019   HCT 42.5 09/23/2019   PLT 141.0 (L) 09/23/2019   GLUCOSE 99 09/23/2019   CHOL 113 04/12/2019   TRIG 187.0 (H) 04/12/2019   HDL 33.20 (L) 04/12/2019   LDLDIRECT 55.0 02/22/2018   LDLCALC 42 04/12/2019   ALT 21 07/29/2019   AST 20 07/29/2019   NA 138 09/23/2019   K 4.1 09/23/2019   CL 104 09/23/2019   CREATININE 1.27 09/23/2019   BUN 23 09/23/2019   CO2 27 09/23/2019   TSH 2.38 07/23/2017   PSA 2.53 03/12/2018   INR 1.0 02/05/2012   HGBA1C 6.1 09/23/2019   MICROALBUR 0.6 05/15/2006     BNP (last 3 results) No results for input(s): BNP in the last 8760 hours.  ProBNP (last 3 results) No results for input(s): PROBNP in the last 8760  hours.   Other Studies  Reviewed Today:  ECHO IMPRESSIONS 04/2019  1. Left ventricular ejection fraction, by visual estimation, is 60 to  65%. The left ventricle has normal function. There is no left ventricular  hypertrophy.  2. Left ventricular diastolic parameters are consistent with Grade I  diastolic dysfunction (impaired relaxation).  3. The left ventricle has no regional wall motion abnormalities.  4. Global right ventricle has normal systolic function.The right  ventricular size is normal.  5. Left atrial size was mildly dilated.  6. Right atrial size was normal.  7. The mitral valve is normal in structure. Trivial mitral valve  regurgitation. No evidence of mitral stenosis.  8. The tricuspid valve is normal in structure.  9. The tricuspid valve is normal in structure. Tricuspid valve  regurgitation is mild.  10. The aortic valve is tricuspid. Aortic valve regurgitation is not  visualized. Mild aortic valve sclerosis without stenosis.  11. The pulmonic valve was not well visualized. Pulmonic valve  regurgitation is trivial.  12. Aortic dilatation noted.  13. There is mild dilatation of the ascending aorta measuring 42 mm.  14. Normal pulmonary artery systolic pressure.  15. The inferior vena cava is normal in size with greater than 50%  respiratory variability, suggesting right atrial pressure of 3 mmHg.  16. Normal LV systolic function; grade 1 diastolic dysfunction; mildly  dilated ascending aorta; mild LAE.    Cardiac cath 02/06/12: Left main: 10% distal stenosis.  Left Anterior Descending Artery: Large caliber vessel that courses to the apex with mild luminal irregularities proximal and distal.  Circumflex Artery: Very large caliber vessel. The first OM is small and has an ostial 40% stenosis. The second OM is very large (5.44mm) and has a 95% stenosis in the proximal portion of the vessel.  Right Coronary Artery: Large, dominant artery with 30% proximal stenosis, 60% mid stenosis,  diffuse 30% distal disease. The PDA and PLA are patent with mild plaque disease.  Left Ventricular Angiogram: LVEF=65%  Impression:  1. Double vessel CAD  2. Unstable angina  3. Normal LV systolic function  4. Successful PTCA/bare metal stent x 1 OM2.     Assessment/Plan:  1. Unstable angina - have recommended cardiac catheterization - The patient understands that risks include but are not limited to stroke (1 in 1000), death (1 in 1000), kidney failure [usually temporary] (1 in 500), bleeding (1 in 200), allergic reaction [possibly serious] (1 in 200), and agrees to proceed. He is planning on traveling to [place] - which I advised against - will be back next week - needs to curtail his activities in the interim - no Cialis, adding Imdur 30mg  a day. Cath arranged for next Thursday with Dr. Tamala Julian - will repeat EKG today as well. He does remain on aspirin solely. He is to seek medical attention if problems arise in the interim - he does have sl NTG on hand - reminded in how to use. EKG was repeated today - personally reviewed by me - shows sinus brady - PVCs noted.   2. HTN - BP is good on his current regimen.   3. HLD - on statin  4. History of asthma  The following changes have been made:  See above.  Labs/ tests ordered today include:    Orders Placed This Encounter  Procedures  . Comprehensive metabolic panel  . CBC  . EKG 12-Lead     Disposition:   FU with [name] in a Alvarez weeks for post cath  visit. Lab today. Cath arranged for next week upon his return home per his request. Imdur has been started.    Patient is agreeable to this plan and will call if any problems develop in the interim.   SignedTruitt Merle, NP  09/28/2019 10:45 AM  [place] [address] [place], [place]  [address] Phone: [phone] Fax: [phone]      Addendum: Patient has called back - after discussion with wife - they plan to cancel their trip -  would like to proceed with cath with Dr. Angelena Alvarez for this Friday - June 25th - have been able to schedule for June 25th at 7:30AM - same instructions - NPO after midnight on Thursday. I will hold on adding the Imdur - he does have NTG on hand and was instructed in how to use.   Burtis Junes, RN, [place] [address] [place], [place]  [phone]

## 2019-09-26 NOTE — H&P (View-Only) (Signed)
CARDIOLOGY OFFICE NOTE  Date:  09/28/2019    George Alvarez [facility] Date of Birth: 05-22-1945 Medical Record #[id number]  PCP:  Colon Branch, MD  Cardiologist:  [facility]  Chief Complaint  Patient presents with  . Chest Pain    History of Present Illness: George Alvarez is a 74 y.o. male who presents today for a work in visit. Seen for Dr. Angelena Form.   He has a history of HTN, HLD, PVCs and CAD. Cardiac cath on 02/06/12 with 95% proximal OM2 lesion and a 60% mid RCA lesion. EF was 65%. His OM2 was very large in caliber and was treated with bare-metal stents x 2.  Medical management of RCA stenosis which was felt to be moderate. He has chronic dizziness and is felt to have vertigo. Normal stress myoview December 2015. He has been limited by chronic back pain due to spinal stenosis but following back surgery in July 2019 his pain improved. He been noted to be ASA intolerant in the past but recently had ASA testing in primary care and tolerated it well.   Last seen by Dr. Angelena Form in January and was felt to be doing well - wished to stop Plavix and start ASA since he had had recent normal ASA testing and was without issues. Echo was updated - noted dilated aorta and CTA was done - no thoracic aneurysm noted.   Saw his PCP last week - endorsed chest pain at that visit - EKG noted. Had recently flew from [place] and was having on and off chest heaviness with SOB. Was recovering from URI - has history of asthma. D dimer was negative. They were there baby sitting - the kids got sick with cough and high fevers - kids were negative COVID.   The patient does not have symptoms concerning for COVID-19 infection (fever, chills, cough, or new shortness of breath).   Comes in today. Here alone. He has been vaccinated. His appointment last week had already been scheduled with PCP - noted still with some tightness in his chest - different from his asthma. Little short of breath. He has noted some shortness of  breath with exertion over the past several weeks. The tightness seemed to have started while in [place] and has persisted since coming home over the past few weeks.  He has tried to continue to do his daily walking - this has gotten harder. He notes that he is more short of breath with this. He has continued to have tightness - more of a pressure sensation - can last up to 30 minutes at a time - with exertion. No actual pain. He has not used NTG - never has. He was hoping this could be his asthma but this does act like his asthma and he admits that it feels "different". He does not remember how he felt when he had his stents.   Past Medical History:  Diagnosis Date  . Anal fissure   . Arthritis   . Asthma, chronic 06/16/2007   Qualifier: Diagnosis of  By: Linna Darner MD, Gwyndolyn Saxon   Onset:as child Triggers (environmental, infectious, allergic): all, mainly environmental triggers Rescue inhaler YIA:[id number] Maintenance medications/ response:Singulair,Qvar Smoking history:never Family history pulmonary disease: no    . Basal cell carcinoma of chest wall 2016   1.8 cm, treated with electrodessication and currettage  . Benign paroxysmal positional vertigo 11/19/2012   Diagn[facility] at [facility]  Physical therapy appointment pending   . BPH (benign prostatic  hyperplasia)    With urinary obstruction  . Coronary artery disease    a. 02/2012 Cath/PCI: LM 10, LAD min irregs, LCX large, OM1 sm, 40 ost, OM2 95p (5.0x16 Veriflex & 4.5x12 Veriflex BMS'), RCA 30p, 28m, 30d, PDA/PLA min irregs, EF 65%  . Diverticulosis   . Erectile dysfunction due to arterial insufficiency   . Fundic gland polyps of stomach, benign 2010  . GERD (gastroesophageal reflux disease)   . History of elevated PSA   . History of kidney stones   . Hyperlipidemia   . Hypertension   . Nocturia   . Perennial allergic rhinitis   . Peyronie's disease   . Skin cancer    Basal and squamous cell cancers, greater than 20  . Stroke ([place])  2016  . Tubular adenoma of colon 2010    Past Surgical History:  Procedure Laterality Date  . CORONARY ANGIOPLASTY WITH STENT PLACEMENT  02/06/2012   OM2  bare metal   . EPIDURAL BLOCK INJECTION  04-2015   Dr Brien Few  . epidural steroids  2007, 2009   X 2 @ cervical &, lumbar)  . HERNIA REPAIR    . INTRAVASCULAR ULTRASOUND  02/06/2012   Procedure: INTRAVASCULAR ULTRASOUND;  Surgeon: [name], MD;  Location: [facility] CATH LAB;  Service: Cardiovascular;;  . LEFT HEART CATHETERIZATION WITH CORONARY ANGIOGRAM N/A 02/06/2012   Procedure: LEFT HEART CATHETERIZATION WITH CORONARY ANGIOGRAM;  Surgeon: [name], MD;  Location: [facility] CATH LAB;  Service: Cardiovascular;  Laterality: N/A;  . LUMBAR LAMINECTOMY/DECOMPRESSION MICRODISCECTOMY N/A 10/21/2017   Procedure: Lumbar Two-Three, Lumbar Four-Five Discectomy;  Surgeon: Ashok Pall, MD;  Location: [place];  Service: Neurosurgery;  Laterality: N/A;  . PERCUTANEOUS CORONARY STENT INTERVENTION (PCI-S)  02/06/2012   Procedure: PERCUTANEOUS CORONARY STENT INTERVENTION (PCI-S);  Surgeon: [name], MD;  Location: [facility] CATH LAB;  Service: Cardiovascular;;  . septoplasty    . TONSILLECTOMY AND ADENOIDECTOMY    . TRIGGER FINGER RELEASE    . UPPER GASTROINTESTINAL ENDOSCOPY  2011   gastric polyps  . VASECTOMY       Medications: Current Meds  Medication Sig  . albuterol (PROVENTIL HFA;VENTOLIN HFA) 108 (90 Base) MCG/ACT inhaler Inhale 2 puffs into the lungs every 6 (six) hours as needed for wheezing or shortness of breath.  Marland Kitchen aspirin EC 81 MG tablet Take 1 tablet (81 mg total) by mouth daily.  Marland Kitchen azelastine (ASTELIN) 0.1 % nasal spray Place 2 sprays into both nostrils at bedtime as needed for rhinitis or allergies.  . cholecalciferol (VITAMIN D) 1000 units tablet Take 1,000 Units by mouth daily.  . fluticasone (FLONASE) 50 MCG/ACT nasal spray ONE SPRAY EACH NOSTRIL TWICE A DAY IF NEEDED FOR STUFFY NOSE.  . fluticasone  (FLOVENT HFA) 110 MCG/ACT inhaler Inhale 1 puff into the lungs 2 (two) times daily.  . hydrocortisone 2.5 % cream Apply 1 application topically as needed.  Marland Kitchen ketoconazole (NIZORAL) 2 % shampoo Apply 1 application topically as needed for irritation.  . metoprolol tartrate (LOPRESSOR) 25 MG tablet Take 0.5 tablets (12.5 mg total) by mouth 2 (two) times daily.  . montelukast (SINGULAIR) 10 MG tablet TAKE 1 TABLET BY MOUTH EVERYDAY AT BEDTIME  . Multiple Vitamin (MULTIVITAMIN WITH MINERALS) TABS tablet Take 1 tablet by mouth daily.  . nitroGLYCERIN (NITROSTAT) 0.4 MG SL tablet Place 1 tablet (0.4 mg total) under the tongue every 5 (five) minutes x 3 doses as needed for chest pain.  Marland Kitchen olmesartan-hydrochlorothiazide (BENICAR HCT) 20-12.5 MG tablet Take  1 tablet by mouth daily.  . Omega-3 Fatty Acids (FISH OIL) 1200 MG CAPS Take 1,200 mg by mouth daily.  . pantoprazole (PROTONIX) 40 MG tablet Take 1 tablet (40 mg total) by mouth 2 (two) times daily.  . rosuvastatin (CRESTOR) 40 MG tablet Take 1 tablet (40 mg total) by mouth every evening.  . tadalafil (CIALIS) 5 MG tablet Take 5 mg by mouth daily as needed for erectile dysfunction.  . tamsulosin (FLOMAX) 0.4 MG CAPS capsule Take 1 capsule (0.4 mg total) by mouth daily.  Marland Kitchen triamcinolone cream (KENALOG) 0.1 % Apply 1 application topically as needed.      Allergies: Allergies  Allergen Reactions  . Nifedipine Dermatitis and Rash    edema    Social History: The patient  reports that he has never smoked. He has never used smokeless tobacco. He reports current alcohol use. He reports that he does not use drugs.   Family History: The patient's family history includes Breast cancer in his maternal aunt, maternal grandmother, and mother; Dementia in his maternal grandmother; Diabetes in his maternal grandmother; Esophageal cancer in his mother; Heart attack in his maternal grandmother; Heart attack (age of onset: 22) in his father; Heart attack (age of  onset: 35) in his paternal uncle; Heart disease in his mother; Stroke in his maternal grandmother.   Review of Systems: Please see the history of present illness.   All other systems are reviewed and negative.   Physical Exam: VS:  BP 120/82   Pulse (!) 59   Ht 5\' 10"  (1.778 m)   Wt 218 lb 12.8 oz (99.2 kg)   SpO2 98%   BMI 31.39 kg/m  .  BMI Body mass index is 31.39 kg/m.  Wt Readings from Last 3 Encounters:  09/28/19 218 lb 12.8 oz (99.2 kg)  09/23/19 218 lb 4 oz (99 kg)  07/29/19 219 lb (99.3 kg)    General: Pleasant. Alert and in no acute distress.   HEENT: Normal.  Neck: Supple, no JVD, carotid bruits, or masses noted.  Cardiac: Regular rate and rhythm. No murmurs, rubs, or gallops. No edema.  Respiratory:  Lungs are clear to auscultation bilaterally with normal work of breathing.  GI: Soft and nontender.  MS: No deformity or atrophy. Gait and ROM intact.  Skin: Warm and dry. Color is normal.  Neuro:  Strength and sensation are intact and no gross focal deficits noted.  Psych: Alert, appropriate and with normal affect.   LABORATORY DATA:  EKG:  EKG is not ordered today.  I have reviewed last week's tracing.   Lab Results  Component Value Date   WBC 8.9 09/23/2019   HGB 14.6 09/23/2019   HCT 42.5 09/23/2019   PLT 141.0 (L) 09/23/2019   GLUCOSE 99 09/23/2019   CHOL 113 04/12/2019   TRIG 187.0 (H) 04/12/2019   HDL 33.20 (L) 04/12/2019   LDLDIRECT 55.0 02/22/2018   LDLCALC 42 04/12/2019   ALT 21 07/29/2019   AST 20 07/29/2019   NA 138 09/23/2019   K 4.1 09/23/2019   CL 104 09/23/2019   CREATININE 1.27 09/23/2019   BUN 23 09/23/2019   CO2 27 09/23/2019   TSH 2.38 07/23/2017   PSA 2.53 03/12/2018   INR 1.0 02/05/2012   HGBA1C 6.1 09/23/2019   MICROALBUR 0.6 05/15/2006     BNP (last 3 results) No results for input(s): BNP in the last 8760 hours.  ProBNP (last 3 results) No results for input(s): PROBNP in the last 8760  hours.   Other Studies  Reviewed Today:  ECHO IMPRESSIONS 04/2019  1. Left ventricular ejection fraction, by visual estimation, is 60 to  65%. The left ventricle has normal function. There is no left ventricular  hypertrophy.  2. Left ventricular diastolic parameters are consistent with Grade I  diastolic dysfunction (impaired relaxation).  3. The left ventricle has no regional wall motion abnormalities.  4. Global right ventricle has normal systolic function.The right  ventricular size is normal.  5. Left atrial size was mildly dilated.  6. Right atrial size was normal.  7. The mitral valve is normal in structure. Trivial mitral valve  regurgitation. No evidence of mitral stenosis.  8. The tricuspid valve is normal in structure.  9. The tricuspid valve is normal in structure. Tricuspid valve  regurgitation is mild.  10. The aortic valve is tricuspid. Aortic valve regurgitation is not  visualized. Mild aortic valve sclerosis without stenosis.  11. The pulmonic valve was not well visualized. Pulmonic valve  regurgitation is trivial.  12. Aortic dilatation noted.  13. There is mild dilatation of the ascending aorta measuring 42 mm.  14. Normal pulmonary artery systolic pressure.  15. The inferior vena cava is normal in size with greater than 50%  respiratory variability, suggesting right atrial pressure of 3 mmHg.  16. Normal LV systolic function; grade 1 diastolic dysfunction; mildly  dilated ascending aorta; mild LAE.    Cardiac cath 02/06/12: Left main: 10% distal stenosis.  Left Anterior Descending Artery: Large caliber vessel that courses to the apex with mild luminal irregularities proximal and distal.  Circumflex Artery: Very large caliber vessel. The first OM is small and has an ostial 40% stenosis. The second OM is very large (5.54mm) and has a 95% stenosis in the proximal portion of the vessel.  Right Coronary Artery: Large, dominant artery with 30% proximal stenosis, 60% mid stenosis,  diffuse 30% distal disease. The PDA and PLA are patent with mild plaque disease.  Left Ventricular Angiogram: LVEF=65%  Impression:  1. Double vessel CAD  2. Unstable angina  3. Normal LV systolic function  4. Successful PTCA/bare metal stent x 1 OM2.     Assessment/Plan:  1. Unstable angina - have recommended cardiac catheterization - The patient understands that risks include but are not limited to stroke (1 in 1000), death (1 in 1000), kidney failure [usually temporary] (1 in 500), bleeding (1 in 200), allergic reaction [possibly serious] (1 in 200), and agrees to proceed. He is planning on traveling to [place] - which I advised against - will be back next week - needs to curtail his activities in the interim - no Cialis, adding Imdur 30mg  a day. Cath arranged for next Thursday with Dr. Tamala Julian - will repeat EKG today as well. He does remain on aspirin solely. He is to seek medical attention if problems arise in the interim - he does have sl NTG on hand - reminded in how to use. EKG was repeated today - personally reviewed by me - shows sinus brady - PVCs noted.   2. HTN - BP is good on his current regimen.   3. HLD - on statin  4. History of asthma  The following changes have been made:  See above.  Labs/ tests ordered today include:    Orders Placed This Encounter  Procedures  . Comprehensive metabolic panel  . CBC  . EKG 12-Lead     Disposition:   FU with [name] in a few weeks for post cath  visit. Lab today. Cath arranged for next week upon his return home per his request. Imdur has been started.    Patient is agreeable to this plan and will call if any problems develop in the interim.   SignedTruitt Merle, NP  09/28/2019 10:45 AM  [place] [address] [place], [place]  [address] Phone: [phone] Fax: [phone]      Addendum: Patient has called back - after discussion with wife - they plan to cancel their trip -  would like to proceed with cath with Dr. Angelena Form for this Friday - June 25th - have been able to schedule for June 25th at 7:30AM - same instructions - NPO after midnight on Thursday. I will hold on adding the Imdur - he does have NTG on hand and was instructed in how to use.   Burtis Junes, RN, [place] [address] [place], [place]  [phone]

## 2019-09-28 ENCOUNTER — Ambulatory Visit: Payer: PPO | Admitting: Nurse Practitioner

## 2019-09-28 ENCOUNTER — Other Ambulatory Visit: Payer: Self-pay

## 2019-09-28 ENCOUNTER — Telehealth: Payer: Self-pay | Admitting: *Deleted

## 2019-09-28 ENCOUNTER — Encounter: Payer: Self-pay | Admitting: Nurse Practitioner

## 2019-09-28 VITALS — BP 120/82 | HR 59 | Ht 70.0 in | Wt 218.8 lb

## 2019-09-28 DIAGNOSIS — I10 Essential (primary) hypertension: Secondary | ICD-10-CM | POA: Diagnosis not present

## 2019-09-28 DIAGNOSIS — I200 Unstable angina: Secondary | ICD-10-CM | POA: Diagnosis not present

## 2019-09-28 DIAGNOSIS — E7800 Pure hypercholesterolemia, unspecified: Secondary | ICD-10-CM

## 2019-09-28 DIAGNOSIS — I2510 Atherosclerotic heart disease of native coronary artery without angina pectoris: Secondary | ICD-10-CM

## 2019-09-28 LAB — CBC
Hematocrit: 44.9 % (ref 37.5–51.0)
Hemoglobin: 15.4 g/dL (ref 13.0–17.7)
MCH: 30.1 pg (ref 26.6–33.0)
MCHC: 34.3 g/dL (ref 31.5–35.7)
MCV: 88 fL (ref 79–97)
Platelets: 134 10*3/uL — ABNORMAL LOW (ref 150–450)
RBC: 5.12 x10E6/uL (ref 4.14–5.80)
RDW: 12.6 % (ref 11.6–15.4)
WBC: 6.9 10*3/uL (ref 3.4–10.8)

## 2019-09-28 LAB — COMPREHENSIVE METABOLIC PANEL
ALT: 21 IU/L (ref 0–44)
AST: 21 IU/L (ref 0–40)
Albumin/Globulin Ratio: 2.6 — ABNORMAL HIGH (ref 1.2–2.2)
Albumin: 4.7 g/dL (ref 3.7–4.7)
Alkaline Phosphatase: 59 IU/L (ref 48–121)
BUN/Creatinine Ratio: 17 (ref 10–24)
BUN: 20 mg/dL (ref 8–27)
Bilirubin Total: 0.5 mg/dL (ref 0.0–1.2)
CO2: 21 mmol/L (ref 20–29)
Calcium: 9.6 mg/dL (ref 8.6–10.2)
Chloride: 105 mmol/L (ref 96–106)
Creatinine, Ser: 1.16 mg/dL (ref 0.76–1.27)
GFR calc Af Amer: 73 mL/min/{1.73_m2} (ref >59)
GFR calc non Af Amer: 63 mL/min/{1.73_m2} (ref >59)
Globulin, Total: 1.8 g/dL (ref 1.5–4.5)
Glucose: 127 mg/dL — ABNORMAL HIGH (ref 65–99)
Potassium: 4.2 mmol/L (ref 3.5–5.2)
Sodium: 140 mmol/L (ref 134–144)
Total Protein: 6.5 g/dL (ref 6.0–8.5)

## 2019-09-28 MED ORDER — ISOSORBIDE MONONITRATE ER 30 MG PO TB24: 30.0000 mg | ORAL_TABLET | Freq: Every day | ORAL | 11 refills | Status: DC

## 2019-09-28 NOTE — Telephone Encounter (Signed)
Pt calling in today after ov with Cecille Rubin to cancel his [place] trip tomorrow and move up appt date and time for heart cath with Dr. Angelena Form. Pre-cert aware.

## 2019-09-28 NOTE — Patient Instructions (Addendum)
After Visit Summary:  We will be checking the following labs today - CMET & CBC   Medication Instructions:    Continue with your current medicines. BUT  I am adding Imdur 30 mg to take one a day - this may cause a headache - you can use Tylenol and take this at night if needed. This is at your pharmacy Use your NTG under your tongue for recurrent chest pain/tightness. May take one tablet every 5 minutes. If you are still having discomfort after 3 tablets in 15 minutes, call 911.    If you need a refill on your cardiac medications before your next appointment, please call your pharmacy.     Testing/Procedures To Be Arranged:  Cardiac catheterization  Follow-Up:   See [name] back in a few weeks for post cath visit    At [facility], you and your health needs are our priority.  As part of our continuing mission to provide you with exceptional heart care, we have created designated Provider Care Teams.  These Care Teams include your primary Cardiologist (physician) and Advanced Practice Providers (APPs -  Physician Assistants and Nurse Practitioners) who all work together to provide you with the care you need, when you need it.  Special Instructions:  . Stay safe, wash your hands for at least 20 seconds and wear a mask when needed.  . It was good to talk with you today.   Your provider has recommended a cardiac catherization.    You are scheduled for a cardiac catheterization on Thursday, July 1st at 7:30 AM with Dr. Tamala Julian or associate.  Please arrive at the [facility] (Main Entrance) at [facility] at [place] [facility] on Thursday, July 1st at [place] parking is available.   You are allowed only one visitor in the hospital with you. Both you and your visitor must wear masks.    Special note: Every effort is made to have your procedure done on time.   Please understand that emergencies sometimes delay a scheduled    procedure.  No food or drink after midnight on Wednesday  You may take your morning medications with a sip of water on the day of your procedure.  Please take a baby aspirin (81 mg) on the morning of your procedure.   Medications to [place] for a one night stay -- bring personal belongings.  Bring a current list of your medications and current insurance cards.  You MUST have a responsible person to drive you home. Someone MUST be with you the first 24 hours after you arrive home or your discharge will be delayed. Wear clothes that are easy to get on and off and wear slip on shoes.    Coronary Angiogram A coronary angiogram, also called coronary angiography, is an X-ray procedure used to look at the arteries in the heart. In this procedure, a dye (contrast dye) is injected through a long, hollow tube (catheter). The catheter is about the size of a piece of cooked spaghetti and is inserted through your groin, wrist, or arm. The dye is injected into each artery, and X-rays are then taken to show if there is a blockage in the arteries of your heart.  LET [facility] CARE PROVIDER KNOW ABOUT:  Any allergies you have, including allergies to shellfish or contrast dye.    All medicines you are taking, including vitamins, herbs, eye drops, creams, and over-the-counter  medicines.    Previous problems you or members of your family have had with the use of anesthetics.    Any blood disorders you have.    Previous surgeries you have had.  History of kidney problems or failure.    Other medical conditions you have.  RISKS AND COMPLICATIONS  Generally, a coronary angiogram is a safe procedure. However, about 1 person out of 1000 can have problems that may include:  Allergic reaction to the dye.  Bleeding/bruising from the access site or other locations.  Kidney injury, especially in people with impaired kidney function.   Stroke (rare).  Heart attack (rare).  Irregular  rhythms (rare)  Death (rare)  BEFORE THE PROCEDURE   Do not eat or drink anything after midnight the night before the procedure or as directed by your health care provider.    Ask your health care provider about changing or stopping your regular medicines. This is especially important if you are taking diabetes medicines or blood thinners.  PROCEDURE  You may be given a medicine to help you relax (sedative) before the procedure. This medicine is given through an intravenous (IV) access tube that is inserted into one of your veins.    The area where the catheter will be inserted will be washed and shaved. This is usually done in the groin but may be done in the fold of your arm (near your elbow) or in the wrist.     A medicine will be given to numb the area where the catheter will be inserted (local anesthetic).    The health care provider will insert the catheter into an artery. The catheter will be guided by using a special type of X-ray (fluoroscopy) of the blood vessel being examined.    A special dye will then be injected into the catheter, and X-rays will be taken. The dye will help to show where any narrowing or blockages are located in the heart arteries.     AFTER THE PROCEDURE   If the procedure is done through the leg, you will be kept in bed lying flat for several hours. You will be instructed to not bend or cross your legs.  The insertion site will be checked frequently.    The pulse in your feet or wrist will be checked frequently.    Additional blood tests, X-rays, and an electrocardiogram may be done.       Call the [place] office at [phone] if you have any questions, problems or concerns.

## 2019-09-29 ENCOUNTER — Telehealth: Payer: Self-pay | Admitting: *Deleted

## 2019-09-29 NOTE — Telephone Encounter (Signed)
Pt contacted pre-catheterization scheduled at [facility] for: Friday September 30, 2019 7:30 AM Verified arrival time and place: [facility]) at: 5:30 AM   No solid food after midnight prior to cath, clear liquids until 5 AM day of procedure.  Hold: Olmesartan-HCT-AM of procedure  Except hold medications AM meds can be  taken pre-cath with sips of water including: ASA 81 mg   Confirmed patient has responsible adult to drive home post procedure and observe 24 hours after arriving home: yes  You are allowed ONE visitor in the waiting room during your procedure. Both you and your visitor must wear masks.      COVID-19 Pre-Screening Questions:  . In the past 7 to 10 days have you had a new cough, shortness of breath, headache, congestion, fever (100 or greater) unexplained body aches, new sore throat, or sudden loss of taste or sense of smell? no . In the past 7 to 10 days have you been around anyone with known Covid 19? no     Reviewed procedure/mask/visitor instructions, COVID-19 screening questions with patient.

## 2019-09-30 ENCOUNTER — Ambulatory Visit (HOSPITAL_COMMUNITY)
Admission: RE | Admit: 2019-09-30 | Discharge: 2019-09-30 | Disposition: A | Discharge status: Home / Self Care | Payer: PPO | Attending: Cardiovascular Disease | Admitting: Cardiovascular Disease

## 2019-09-30 ENCOUNTER — Encounter (HOSPITAL_COMMUNITY): Payer: Self-pay | Admitting: Cardiovascular Disease

## 2019-09-30 ENCOUNTER — Other Ambulatory Visit: Payer: Self-pay

## 2019-09-30 ENCOUNTER — Encounter (HOSPITAL_COMMUNITY)
Admission: RE | Disposition: A | Discharge status: Home / Self Care | Payer: Self-pay | Source: Home / Self Care | Attending: Cardiovascular Disease

## 2019-09-30 DIAGNOSIS — E785 Hyperlipidemia, unspecified: Secondary | ICD-10-CM | POA: Diagnosis not present

## 2019-09-30 DIAGNOSIS — R079 Chest pain, unspecified: Secondary | ICD-10-CM

## 2019-09-30 DIAGNOSIS — Z79899 Other long term (current) drug therapy: Secondary | ICD-10-CM | POA: Diagnosis not present

## 2019-09-30 DIAGNOSIS — K219 Gastro-esophageal reflux disease without esophagitis: Secondary | ICD-10-CM | POA: Insufficient documentation

## 2019-09-30 DIAGNOSIS — Z7982 Long term (current) use of aspirin: Secondary | ICD-10-CM | POA: Diagnosis not present

## 2019-09-30 DIAGNOSIS — I493 Ventricular premature depolarization: Secondary | ICD-10-CM | POA: Diagnosis not present

## 2019-09-30 DIAGNOSIS — I25110 Atherosclerotic heart disease of native coronary artery with unstable angina pectoris: Secondary | ICD-10-CM | POA: Insufficient documentation

## 2019-09-30 DIAGNOSIS — Z7951 Long term (current) use of inhaled steroids: Secondary | ICD-10-CM | POA: Insufficient documentation

## 2019-09-30 DIAGNOSIS — I2510 Atherosclerotic heart disease of native coronary artery without angina pectoris: Secondary | ICD-10-CM | POA: Diagnosis not present

## 2019-09-30 DIAGNOSIS — J45909 Unspecified asthma, uncomplicated: Secondary | ICD-10-CM | POA: Insufficient documentation

## 2019-09-30 DIAGNOSIS — N400 Enlarged prostate without lower urinary tract symptoms: Secondary | ICD-10-CM | POA: Diagnosis not present

## 2019-09-30 DIAGNOSIS — I200 Unstable angina: Secondary | ICD-10-CM

## 2019-09-30 DIAGNOSIS — I10 Essential (primary) hypertension: Secondary | ICD-10-CM | POA: Insufficient documentation

## 2019-09-30 DIAGNOSIS — Z85828 Personal history of other malignant neoplasm of skin: Secondary | ICD-10-CM | POA: Diagnosis not present

## 2019-09-30 DIAGNOSIS — Z8673 Personal history of transient ischemic attack (TIA), and cerebral infarction without residual deficits: Secondary | ICD-10-CM | POA: Diagnosis not present

## 2019-09-30 HISTORY — PX: LEFT HEART CATH AND CORONARY ANGIOGRAPHY: CATH118249

## 2019-09-30 SURGERY — LEFT HEART CATH AND CORONARY ANGIOGRAPHY
Anesthesia: LOCAL

## 2019-09-30 MED ORDER — SODIUM CHLORIDE 0.9% FLUSH: 3.0000 mL | INTRAVENOUS | Status: DC | PRN

## 2019-09-30 MED ORDER — DIAZEPAM 5 MG PO TABS
ORAL_TABLET | ORAL | Status: AC
  Filled 2019-09-30: qty 1

## 2019-09-30 MED ORDER — FENTANYL CITRATE (PF) 100 MCG/2ML IJ SOLN
INTRAMUSCULAR | Status: DC | PRN
  Administered 2019-09-30: 50 ug via INTRAVENOUS

## 2019-09-30 MED ORDER — MIDAZOLAM HCL 2 MG/2ML IJ SOLN
INTRAMUSCULAR | Status: DC | PRN
  Administered 2019-09-30: 2 mg via INTRAVENOUS

## 2019-09-30 MED ORDER — LIDOCAINE HCL (PF) 1 % IJ SOLN
INTRAMUSCULAR | Status: DC | PRN
  Administered 2019-09-30: 2 mL

## 2019-09-30 MED ORDER — SODIUM CHLORIDE 0.9 % IV SOLN: INTRAVENOUS | Status: AC

## 2019-09-30 MED ORDER — HEPARIN (PORCINE) IN NACL 1000-0.9 UT/500ML-% IV SOLN
INTRAVENOUS | Status: DC | PRN
  Administered 2019-09-30: 500 mL

## 2019-09-30 MED ORDER — HYDRALAZINE HCL 20 MG/ML IJ SOLN: 10.0000 mg | INTRAMUSCULAR | Status: DC | PRN

## 2019-09-30 MED ORDER — SODIUM CHLORIDE 0.9% FLUSH: 3.0000 mL | Freq: Two times a day (BID) | INTRAVENOUS | Status: DC

## 2019-09-30 MED ORDER — IOHEXOL 350 MG/ML SOLN
INTRAVENOUS | Status: DC | PRN
  Administered 2019-09-30: 75 mL

## 2019-09-30 MED ORDER — SODIUM CHLORIDE 0.9 % IV SOLN: INTRAVENOUS | Status: DC

## 2019-09-30 MED ORDER — LIDOCAINE HCL (PF) 1 % IJ SOLN
INTRAMUSCULAR | Status: AC
  Filled 2019-09-30: qty 30

## 2019-09-30 MED ORDER — HEPARIN SODIUM (PORCINE) 1000 UNIT/ML IJ SOLN
INTRAMUSCULAR | Status: AC
  Filled 2019-09-30: qty 1

## 2019-09-30 MED ORDER — ASPIRIN 81 MG PO CHEW: 81.0000 mg | CHEWABLE_TABLET | ORAL | Status: DC

## 2019-09-30 MED ORDER — FENTANYL CITRATE (PF) 100 MCG/2ML IJ SOLN
INTRAMUSCULAR | Status: AC
  Filled 2019-09-30: qty 2

## 2019-09-30 MED ORDER — ACETAMINOPHEN 325 MG PO TABS: 650.0000 mg | ORAL_TABLET | ORAL | Status: DC | PRN

## 2019-09-30 MED ORDER — SODIUM CHLORIDE 0.9 % IV SOLN: 250.0000 mL | INTRAVENOUS | Status: DC | PRN

## 2019-09-30 MED ORDER — ONDANSETRON HCL 4 MG/2ML IJ SOLN: 4.0000 mg | Freq: Four times a day (QID) | INTRAMUSCULAR | Status: DC | PRN

## 2019-09-30 MED ORDER — HEPARIN SODIUM (PORCINE) 1000 UNIT/ML IJ SOLN
INTRAMUSCULAR | Status: DC | PRN
  Administered 2019-09-30: 5000 [IU] via INTRAVENOUS

## 2019-09-30 MED ORDER — LABETALOL HCL 5 MG/ML IV SOLN: 10.0000 mg | INTRAVENOUS | Status: DC | PRN

## 2019-09-30 MED ORDER — HEPARIN (PORCINE) IN NACL 1000-0.9 UT/500ML-% IV SOLN
INTRAVENOUS | Status: AC
  Filled 2019-09-30: qty 1000

## 2019-09-30 MED ORDER — VERAPAMIL HCL 2.5 MG/ML IV SOLN
INTRAVENOUS | Status: DC | PRN
  Administered 2019-09-30: 10 mL via INTRA_ARTERIAL

## 2019-09-30 MED ORDER — MIDAZOLAM HCL 2 MG/2ML IJ SOLN
INTRAMUSCULAR | Status: AC
  Filled 2019-09-30: qty 2

## 2019-09-30 MED ORDER — DIAZEPAM 5 MG PO TABS
5.0000 mg | ORAL_TABLET | ORAL | Status: AC
  Administered 2019-09-30: 5 mg via ORAL

## 2019-09-30 MED ORDER — VERAPAMIL HCL 2.5 MG/ML IV SOLN
INTRAVENOUS | Status: AC
  Filled 2019-09-30: qty 2

## 2019-09-30 SURGICAL SUPPLY — 10 items
CATH 5FR JL3.5 JR4 ANG PIG MP (CATHETERS) ×2 IMPLANT
DEVICE RAD COMP TR BAND LRG (VASCULAR PRODUCTS) ×2 IMPLANT
GLIDESHEATH SLEND SS 6F .021 (SHEATH) ×2 IMPLANT
GUIDEWIRE INQWIRE 1.5J.035X260 (WIRE) ×1 IMPLANT
INQWIRE 1.5J .035X260CM (WIRE) ×2
KIT HEART LEFT (KITS) ×2 IMPLANT
PACK CARDIAC CATHETERIZATION (CUSTOM PROCEDURE TRAY) ×2 IMPLANT
SYR MEDRAD MARK 7 150ML (SYRINGE) ×2 IMPLANT
TRANSDUCER W/STOPCOCK (MISCELLANEOUS) ×2 IMPLANT
TUBING CIL FLEX 10 FLL-RA (TUBING) ×2 IMPLANT

## 2019-09-30 NOTE — Interval H&P Note (Signed)
History and Physical Interval Note:  09/30/2019 7:08 AM  George Alvarez  has presented today for surgery, with the diagnosis of unstable angina.  The various methods of treatment have been discussed with the patient and family. After consideration of risks, benefits and other options for treatment, the patient has consented to  Procedure(s): LEFT HEART CATH AND CORONARY ANGIOGRAPHY (N/A) as a surgical intervention.  The patient's history has been reviewed, patient examined, no change in status, stable for surgery.  I have reviewed the patient's chart and labs.  Questions were answered to the patient's satisfaction.    Cath Lab Visit (complete for each Cath Lab visit)  Clinical Evaluation Leading to the Procedure:   ACS: No.  Non-ACS:    Anginal Classification: CCS III  Anti-ischemic medical therapy: Maximal Therapy (2 or more classes of medications)  Non-Invasive Test Results: No non-invasive testing performed  Prior CABG: No previous CABG        Lauree Chandler

## 2019-09-30 NOTE — Discharge Instructions (Signed)
DRINK PLENTY OF FLUIDS FOR THE NEXT 2-3 DAYS.  KEEP ARM ELEVATED THE REMAINDER OF THE DAY.  Radial Site Care  This sheet gives you information about how to care for yourself after your procedure. Your health care provider may also give you more specific instructions. If you have problems or questions, contact your health care provider. What can I expect after the procedure? After the procedure, it is common to have:  Bruising and tenderness at the catheter insertion area. Follow these instructions at home: Medicines  Take over-the-counter and prescription medicines only as told by your health care provider. Insertion site care 1. Follow instructions from your health care provider about how to take care of your insertion site. Make sure you: ? Wash your hands with soap and water before you change your bandage (dressing). If soap and water are not available, use hand sanitizer. ? Change your dressing as told by your health care provider. 2. Check your insertion site every day for signs of infection. Check for: ? Redness, swelling, or pain. ? Fluid or blood. ? Pus or a bad smell. ? Warmth. 3. Do not take baths, swim, or use a hot tub for 5 days. 4. You may shower 24-48 hours after the procedure. ? Remove the dressing and gently wash the site with plain soap and water. ? Pat the area dry with a clean towel. ? Do not rub the site. That could cause bleeding. 5. Do not apply powder or lotion to the site. Activity  1. For 24 hours after the procedure, or as directed by your health care provider: ? Do not flex or bend the affected arm. ? Do not push or pull heavy objects with the affected arm. ? Do not drive yourself home from the hospital or clinic. You may drive 24 hours after the procedure. ? Do not operate machinery or power tools. 2. Do not push, pull or lift anything that is heavier than 10 lb for 5 days. 3. Ask your health care provider when it is okay to: ? Return to work or  school. ? Resume usual physical activities or sports. ? Resume sexual activity. General instructions  If the catheter site starts to bleed, raise your arm and put firm pressure on the site. If the bleeding does not stop, get help right away. This is a medical emergency.  If you went home on the same day as your procedure, a responsible adult should be with you for the first 24 hours after you arrive home.  Keep all follow-up visits as told by your health care provider. This is important. Contact a health care provider if:  You have a fever.  You have redness, swelling, or yellow drainage around your insertion site. Get help right away if:  You have unusual pain at the radial site.  The catheter insertion area swells very fast.  The insertion area is bleeding, and the bleeding does not stop when you hold steady pressure on the area.  Your arm or hand becomes pale, cool, tingly, or numb. These symptoms may represent a serious problem that is an emergency. Do not wait to see if the symptoms will go away. Get medical help right away. Call your local emergency services (911 in the [place] Do not drive yourself to the hospital. Summary  After the procedure, it is common to have bruising and tenderness at the site.  Follow instructions from your health care provider about how to take care of your radial site wound. Check   Check the wound every day for signs of infection.  Do not push, pull or lift anything that is heavier than 10 lb for 5 days.  This information is not intended to replace advice given to you by your health care provider. Make sure you discuss any questions you have with your health care provider. Document Revised: 04/29/2017 Document Reviewed: 04/29/2017 Elsevier Patient Education  2020 [employer]DRINK PLENTY OF FLUIDS FOR THE NEXT 2-3 DAYS.  KEEP ARM ELEVATED THE REMAINDER OF THE DAY.   Radial Site Care  This sheet gives you information about how to care for yourself  after your procedure. Your health care provider may also give you more specific instructions. If you have problems or questions, contact your health care provider. What can I expect after the procedure? After the procedure, it is common to have:  Bruising and tenderness at the catheter insertion area. Follow these instructions at home: Medicines  Take over-the-counter and prescription medicines only as told by your health care provider. Insertion site care 6. Follow instructions from your health care provider about how to take care of your insertion site. Make sure you: ? Wash your hands with soap and water before you change your bandage (dressing). If soap and water are not available, use hand sanitizer. ? Change your dressing as told by your health care provider. 7. Check your insertion site every day for signs of infection. Check for: ? Redness, swelling, or pain. ? Fluid or blood. ? Pus or a bad smell. ? Warmth. 8. Do not take baths, swim, or use a hot tub for 5 days. 9. You may shower 24-48 hours after the procedure. ? Remove the dressing and gently wash the site with plain soap and water. ? Pat the area dry with a clean towel. ? Do not rub the site. That could cause bleeding. 10. Do not apply powder or lotion to the site. Activity  4. For 24 hours after the procedure, or as directed by your health care provider: ? Do not flex or bend the affected arm. ? Do not push or pull heavy objects with the affected arm. ? Do not drive yourself home from the hospital or clinic. You may drive 24 hours after the procedure. ? Do not operate machinery or power tools. 5. Do not push, pull or lift anything that is heavier than 10 lb for 5 days. 6. Ask your health care provider when it is okay to: ? Return to work or school. ? Resume usual physical activities or sports. ? Resume sexual activity. General instructions  If the catheter site starts to bleed, raise your arm and put firm pressure  on the site. If the bleeding does not stop, get help right away. This is a medical emergency.  If you went home on the same day as your procedure, a responsible adult should be with you for the first 24 hours after you arrive home.  Keep all follow-up visits as told by your health care provider. This is important. Contact a health care provider if:  You have a fever.  You have redness, swelling, or yellow drainage around your insertion site. Get help right away if:  You have unusual pain at the radial site.  The catheter insertion area swells very fast.  The insertion area is bleeding, and the bleeding does not stop when you hold steady pressure on the area.  Your arm or hand becomes pale, cool, tingly, or numb. These symptoms may represent a serious problem that   is an emergency. Do not wait to see if the symptoms will go away. Get medical help right away. Call your local emergency services (911 in the [place] Do not drive yourself to the hospital. Summary  After the procedure, it is common to have bruising and tenderness at the site.  Follow instructions from your health care provider about how to take care of your radial site wound. Check the wound every day for signs of infection.  Do not push, pull or lift anything that is heavier than 10 lb for 5 days.  This information is not intended to replace advice given to you by your health care provider. Make sure you discuss any questions you have with your health care provider. Document Revised: 04/29/2017 Document Reviewed: 04/29/2017 Elsevier Patient Education  2020 Else[employer] Inc[employer] 

## 2019-10-04 NOTE — Progress Notes (Deleted)
CARDIOLOGY OFFICE NOTE  Date:  10/04/2019    George Alvarez [facility] Date of Birth: 06-18-1945 Medical Record #[id number]  PCP:  Colon Branch, MD  Cardiologist:  Servando Snare & ***    No chief complaint on file.   History of Present Illness: George Alvarez is a 74 y.o. male who presents today for a post cath visit. Seen for Dr. Angelena Form.   He has a history of HTN, HLD, PVCsand CAD. Cardiac cath on11/1/13 with 95% proximal OM2 lesion and a 60% mid RCA lesion. EF was 65%. His OM2 was very large in caliber and was treated with bare-metal stents x 2.  Medical management of RCA stenosis which was felt to be moderate. He has chronic dizziness and is felt to have vertigo. Normal stress myoview December 2015. He has been limited by chronic back pain due to spinal stenosis but following back surgery in July 2019 his pain improved.He been noted to be ASA intolerant in the past but recently had ASA testing in primary care and tolerated it well.   Last seen by Dr. Angelena Form in January and was felt to be doing well - wished to stop Plavix and start ASA since he had had recent normal ASA testing and was without issues. Echo was updated - noted dilated aorta and CTA was done - no thoracic aneurysm noted.   Saw his PCP last week - endorsed chest pain at that visit - EKG noted. Had recently flew from [place] and was having on and off chest heaviness with SOB. Was recovering from URI - has history of asthma. D dimer was negative. They were there baby sitting - the kids got sick with cough and high fevers - kids were negative COVID.   I then saw him - symptoms very concerning for unstable angina - referred for cardiac cath. To manage medically.   Comes in today. Here with   Past Medical History:  Diagnosis Date  . Anal fissure   . Arthritis   . Asthma, chronic 06/16/2007   Qualifier: Diagnosis of  By: Linna Darner MD, Gwyndolyn Saxon   Onset:as child Triggers (environmental, infectious, allergic): all, mainly  environmental triggers Rescue inhaler LKG:MWNUUV Maintenance medications/ response:Singulair,Qvar Smoking history:never Family history pulmonary disease: no    . Basal cell carcinoma of chest wall 2016   1.8 cm, treated with electrodessication and currettage  . Benign paroxysmal positional vertigo 11/19/2012   Diagnosed at [facility]  Physical therapy appointment pending   . BPH (benign prostatic hyperplasia)    With urinary obstruction  . Coronary artery disease    a. 02/2012 Cath/PCI: LM 10, LAD min irregs, LCX large, OM1 sm, 40 ost, OM2 95p (5.0x16 Veriflex & 4.5x12 Veriflex BMS'), RCA 30p, 43m, 30d, PDA/PLA min irregs, EF 65%  . Diverticulosis   . Erectile dysfunction due to arterial insufficiency   . Fundic gland polyps of stomach, benign 2010  . GERD (gastroesophageal reflux disease)   . History of elevated PSA   . History of kidney stones   . Hyperlipidemia   . Hypertension   . Nocturia   . Perennial allergic rhinitis   . Peyronie's disease   . Skin cancer    Basal and squamous cell cancers, greater than 20  . Stroke ([place]) 2016  . Tubular adenoma of colon 2010    Past Surgical History:  Procedure Laterality Date  . CORONARY ANGIOPLASTY WITH STENT PLACEMENT  02/06/2012   OM2  bare metal   .  EPIDURAL BLOCK INJECTION  04-2015   Dr Brien Few  . epidural steroids  2007, 2009   X 2 @ cervical &, lumbar)  . HERNIA REPAIR    . INTRAVASCULAR ULTRASOUND  02/06/2012   Procedure: INTRAVASCULAR ULTRASOUND;  Surgeon: [name], MD;  Location: [facility] CATH LAB;  Service: Cardiovascular;;  . LEFT HEART CATH AND CORONARY ANGIOGRAPHY N/A 09/30/2019   Procedure: LEFT HEART CATH AND CORONARY ANGIOGRAPHY;  Surgeon: [name], MD;  Location: [facility] CV LAB;  Service: Cardiovascular;  Laterality: N/A;  . LEFT HEART CATHETERIZATION WITH CORONARY ANGIOGRAM N/A 02/06/2012   Procedure: LEFT HEART CATHETERIZATION WITH CORONARY ANGIOGRAM;  Surgeon: [name], MD;  Location: [facility] CATH LAB;  Service: Cardiovascular;  Laterality: N/A;  . LUMBAR LAMINECTOMY/DECOMPRESSION MICRODISCECTOMY N/A 10/21/2017   Procedure: Lumbar Two-Three, Lumbar Four-Five Discectomy;  Surgeon: Ashok Pall, MD;  Location: [place];  Service: Neurosurgery;  Laterality: N/A;  . PERCUTANEOUS CORONARY STENT INTERVENTION (PCI-S)  02/06/2012   Procedure: PERCUTANEOUS CORONARY STENT INTERVENTION (PCI-S);  Surgeon: [name], MD;  Location: [facility] CATH LAB;  Service: Cardiovascular;;  . septoplasty    . TONSILLECTOMY AND ADENOIDECTOMY    . TRIGGER FINGER RELEASE    . UPPER GASTROINTESTINAL ENDOSCOPY  2011   gastric polyps  . VASECTOMY       Medications: No outpatient medications have been marked as taking for the 10/17/19 encounter (Appointment) with Burtis Junes, NP.     Allergies: Allergies  Allergen Reactions  . Nifedipine Dermatitis and Rash    edema    Social History: The patient  reports that he has never smoked. He has never used smokeless tobacco. He reports current alcohol use. He reports that he does not use drugs.   Family History: The patient's ***family history includes Breast cancer in his maternal aunt, maternal grandmother, and mother; Dementia in his maternal grandmother; Diabetes in his maternal grandmother; Esophageal cancer in his mother; Heart attack in his maternal grandmother; Heart attack (age of onset: 71) in his father; Heart attack (age of onset: 74) in his paternal uncle; Heart disease in his mother; Stroke in his maternal grandmother.   Review of Systems: Please see the history of present illness.   All other systems are reviewed and negative.   Physical Exam: VS:  There were no vitals taken for this visit. Marland Kitchen  BMI There is no height or weight on file to calculate BMI.  Wt Readings from Last 3 Encounters:  09/30/19 218 lb 6.4 oz (99.1 kg)  09/28/19 218 lb 12.8 oz (99.2 kg)  09/23/19 218 lb 4 oz (99 kg)    General:  Pleasant. Well developed, well nourished and in no acute distress.   HEENT: Normal.  Neck: Supple, no JVD, carotid bruits, or masses noted.  Cardiac: ***Regular rate and rhythm. No murmurs, rubs, or gallops. No edema.  Respiratory:  Lungs are clear to auscultation bilaterally with normal work of breathing.  GI: Soft and nontender.  MS: No deformity or atrophy. Gait and ROM intact.  Skin: Warm and dry. Color is normal.  Neuro:  Strength and sensation are intact and no gross focal deficits noted.  Psych: Alert, appropriate and with normal affect.   LABORATORY DATA:  EKG:  EKG {ACTION; IS/IS JSE:[id number]} ordered today.  Personally reviewed by me. This demonstrates ***.  Lab Results  Component Value Date   WBC 6.9 09/28/2019   HGB 15.4 09/28/2019   HCT 44.9 09/28/2019   PLT 134 (L) 09/28/2019  GLUCOSE 127 (H) 09/28/2019   CHOL 113 04/12/2019   TRIG 187.0 (H) 04/12/2019   HDL 33.20 (L) 04/12/2019   LDLDIRECT 55.0 02/22/2018   LDLCALC 42 04/12/2019   ALT 21 09/28/2019   AST 21 09/28/2019   NA 140 09/28/2019   K 4.2 09/28/2019   CL 105 09/28/2019   CREATININE 1.16 09/28/2019   BUN 20 09/28/2019   CO2 21 09/28/2019   TSH 2.38 07/23/2017   PSA 2.53 03/12/2018   INR 1.0 02/05/2012   HGBA1C 6.1 09/23/2019   MICROALBUR 0.6 05/15/2006     BNP (last 3 results) No results for input(s): BNP in the last 8760 hours.  ProBNP (last 3 results) No results for input(s): PROBNP in the last 8760 hours.   Other Studies Reviewed Today:  LEFT HEART CATH AND CORONARY ANGIOGRAPHY 09/2019  Conclusion    Prox RCA-1 lesion is 30% stenosed.  Prox RCA-2 lesion is 60% stenosed.  Mid RCA lesion is 40% stenosed.  Mid LAD lesion is 30% stenosed.  2nd Mrg lesion is 20% stenosed.   1. Patent OM stents with minimal restenosis 2. Mild LAD disease 3. Moderate mid RCA stenosis, unchanged from last cath in 2013. This does not appear to be flow limiting.  4. Normal LV systolic  function  Recommendations: Continue medical management of CAD   ECHO IMPRESSIONS 04/2019  1. Left ventricular ejection fraction, by visual estimation, is 60 to  65%. The left ventricle has normal function. There is no left ventricular  hypertrophy.  2. Left ventricular diastolic parameters are consistent with Grade I  diastolic dysfunction (impaired relaxation).  3. The left ventricle has no regional wall motion abnormalities.  4. Global right ventricle has normal systolic function.The right  ventricular size is normal.  5. Left atrial size was mildly dilated.  6. Right atrial size was normal.  7. The mitral valve is normal in structure. Trivial mitral valve  regurgitation. No evidence of mitral stenosis.  8. The tricuspid valve is normal in structure.  9. The tricuspid valve is normal in structure. Tricuspid valve  regurgitation is mild.  10. The aortic valve is tricuspid. Aortic valve regurgitation is not  visualized. Mild aortic valve sclerosis without stenosis.  11. The pulmonic valve was not well visualized. Pulmonic valve  regurgitation is trivial.  12. Aortic dilatation noted.  13. There is mild dilatation of the ascending aorta measuring 42 mm.  14. Normal pulmonary artery systolic pressure.  15. The inferior vena cava is normal in size with greater than 50%  respiratory variability, suggesting right atrial pressure of 3 mmHg.  16. Normal LV systolic function; grade 1 diastolic dysfunction; mildly  dilated ascending aorta; mild LAE.    Cardiac cath 02/06/12: Left main: 10% distal stenosis.  Left Anterior Descending Artery: Large caliber vessel that courses to the apex with mild luminal irregularities proximal and distal.  Circumflex Artery: Very large caliber vessel. The first OM is small and has an ostial 40% stenosis. The second OM is very large (5.52mm) and has a 95% stenosis in the proximal portion of the vessel.  Right Coronary Artery: Large, dominant  artery with 30% proximal stenosis, 60% mid stenosis, diffuse 30% distal disease. The PDA and PLA are patent with mild plaque disease.  Left Ventricular Angiogram: LVEF=65%  Impression:  1. Double vessel CAD  2. Unstable angina  3. Normal LV systolic function  4. Successful PTCA/bare metal stent x 1 OM2.     Assessment/Plan:  1. Unstable angina - have  recommended cardiac catheterization - The patient understands that risks include but are not limited to stroke (1 in 1000), death (1 in 1000), kidney failure [usually temporary] (1 in 500), bleeding (1 in 200), allergic reaction [possibly serious] (1 in 200), and agrees to proceed. He is planning on traveling to [place] - which I advised against - will be back next week - needs to curtail his activities in the interim - no Cialis, adding Imdur 30mg  a day. Cath arranged for next Thursday with Dr. Tamala Julian - will repeat EKG today as well. He does remain on aspirin solely. He is to seek medical attention if problems arise in the interim - he does have sl NTG on hand - reminded in how to use. EKG was repeated today - personally reviewed by me - shows sinus brady - PVCs noted.   2. HTN - BP is good on his current regimen.   3. HLD - on statin  4. History of asthma  Current medicines are reviewed with the patient today.  The patient does not have concerns regarding medicines other than what has been noted above.  The following changes have been made:  See above.  Labs/ tests ordered today include:   No orders of the defined types were placed in this encounter.    Disposition:   FU with *** in {gen number [id number]} {Days to years:10300}.   Patient is agreeable to this plan and will call if any problems develop in the interim.   SignedTruitt Merle, NP  10/04/2019 4:03 PM  [facility] [address] [place], [place]  [address] Phone: [phone] Fax: [phone]

## 2019-10-08 ENCOUNTER — Other Ambulatory Visit: Payer: Self-pay | Admitting: Family Medicine

## 2019-10-17 ENCOUNTER — Ambulatory Visit: Payer: PPO | Admitting: Nurse Practitioner

## 2019-10-17 DIAGNOSIS — L820 Inflamed seborrheic keratosis: Secondary | ICD-10-CM | POA: Diagnosis not present

## 2019-10-17 DIAGNOSIS — D485 Neoplasm of uncertain behavior of skin: Secondary | ICD-10-CM | POA: Diagnosis not present

## 2019-10-17 DIAGNOSIS — L218 Other seborrheic dermatitis: Secondary | ICD-10-CM | POA: Diagnosis not present

## 2019-10-17 DIAGNOSIS — Z85828 Personal history of other malignant neoplasm of skin: Secondary | ICD-10-CM | POA: Diagnosis not present

## 2019-10-17 DIAGNOSIS — L2089 Other atopic dermatitis: Secondary | ICD-10-CM | POA: Diagnosis not present

## 2019-10-20 ENCOUNTER — Other Ambulatory Visit: Payer: Self-pay

## 2019-10-20 ENCOUNTER — Ambulatory Visit (INDEPENDENT_AMBULATORY_CARE_PROVIDER_SITE_OTHER): Payer: PPO | Admitting: Internal Medicine

## 2019-10-20 VITALS — BP 119/73 | HR 55 | Temp 98.4°F | Resp 16 | Ht 70.0 in | Wt 224.0 lb

## 2019-10-20 DIAGNOSIS — Z09 Encounter for follow-up examination after completed treatment for conditions other than malignant neoplasm: Secondary | ICD-10-CM

## 2019-10-20 DIAGNOSIS — I2510 Atherosclerotic heart disease of native coronary artery without angina pectoris: Secondary | ICD-10-CM | POA: Diagnosis not present

## 2019-10-20 NOTE — Patient Instructions (Signed)
    GO TO THE FRONT DESK, PLEASE SCHEDULE YOUR APPOINTMENTS Come back for a physical by 03/2020

## 2019-10-20 NOTE — Progress Notes (Signed)
Subjective:    Patient ID: George Alvarez, male    DOB: March 11, 1946, 74 y.o.   MRN: [id number]  DOS:  10/20/2019 Type of visit - description: Follow-up Here for a quick follow-up b/c e the last office visit he reported multiple issues. Since then he is better, no further headaches, shortness of breath or chest heaviness.  Cardiac catheter was done, results reviewed.  Review of Systems See above   Past Medical History:  Diagnosis Date  . Anal fissure   . Arthritis   . Asthma, chronic 06/16/2007   Qualifier: Diagnosis of  By: Linna Darner MD, Gwyndolyn Saxon   Onset:as child Triggers (environmental, infectious, allergic): all, mainly environmental triggers Rescue inhaler BMW:[id number] Maintenance medications/ response:Singulair,Qvar Smoking history:never Family history pulmonary disease: no    . Basal cell carcinoma of chest wall 2016   1.8 cm, treated with electrodessication and currettage  . Benign paroxysmal positional vertigo 11/19/2012   Diagnosed at [facility], [facility]  Physical therapy appointment pending   . BPH (benign prostatic hyperplasia)    With urinary obstruction  . Coronary artery disease    a. 02/2012 Cath/PCI: LM 10, LAD min irregs, LCX large, OM1 sm, 40 ost, OM2 95p (5.0x16 Veriflex & 4.5x12 Veriflex BMS'), RCA 30p, 32m, 30d, PDA/PLA min irregs, EF 65%  . Diverticulosis   . Erectile dysfunction due to arterial insufficiency   . Fundic gland polyps of stomach, benign 2010  . GERD (gastroesophageal reflux disease)   . History of elevated PSA   . History of kidney stones   . Hyperlipidemia   . Hypertension   . Nocturia   . Perennial allergic rhinitis   . Peyronie's disease   . Skin cancer    Basal and squamous cell cancers, greater than 20  . Stroke ([place]) 2016  . Tubular adenoma of colon 2010    Past Surgical History:  Procedure Laterality Date  . CORONARY ANGIOPLASTY WITH STENT PLACEMENT  02/06/2012   OM2  bare metal   . EPIDURAL BLOCK INJECTION  04-2015   Dr  Brien Few  . epidural steroids  2007, 2009   X 2 @ cervical &, lumbar)  . HERNIA REPAIR    . INTRAVASCULAR ULTRASOUND  02/06/2012   Procedure: INTRAVASCULAR ULTRASOUND;  Surgeon: [name], MD;  Location: [facility] CATH LAB;  Service: Cardiovascular;;  . LEFT HEART CATH AND CORONARY ANGIOGRAPHY N/A 09/30/2019   Procedure: LEFT HEART CATH AND CORONARY ANGIOGRAPHY;  Surgeon: [name], MD;  Location: [place] CV LAB;  Service: Cardiovascular;  Laterality: N/A;  . LEFT HEART CATHETERIZATION WITH CORONARY ANGIOGRAM N/A 02/06/2012   Procedure: LEFT HEART CATHETERIZATION WITH CORONARY ANGIOGRAM;  Surgeon: [name], MD;  Location: [facility] CATH LAB;  Service: Cardiovascular;  Laterality: N/A;  . LUMBAR LAMINECTOMY/DECOMPRESSION MICRODISCECTOMY N/A 10/21/2017   Procedure: Lumbar Two-Three, Lumbar Four-Five Discectomy;  Surgeon: Ashok Pall, MD;  Location: [place];  Service: Neurosurgery;  Laterality: N/A;  . PERCUTANEOUS CORONARY STENT INTERVENTION (PCI-S)  02/06/2012   Procedure: PERCUTANEOUS CORONARY STENT INTERVENTION (PCI-S);  Surgeon: [name], MD;  Location: [facility] CATH LAB;  Service: Cardiovascular;;  . septoplasty    . TONSILLECTOMY AND ADENOIDECTOMY    . TRIGGER FINGER RELEASE    . UPPER GASTROINTESTINAL ENDOSCOPY  2011   gastric polyps  . VASECTOMY      Allergies as of 10/20/2019      Reactions   Nifedipine Dermatitis, Rash   edema      Medication List  Accurate as of October 20, 2019 11:59 PM. If you have any questions, ask your nurse or doctor.        STOP taking these medications   isosorbide mononitrate 30 MG 24 hr tablet Commonly known as: IMDUR Stopped by: Kathlene November, MD     TAKE these medications   albuterol 108 (90 Base) MCG/ACT inhaler Commonly known as: VENTOLIN HFA Inhale 2 puffs into the lungs every 6 (six) hours as needed for wheezing or shortness of breath.   aspirin EC 81 MG tablet Take 1 tablet (81 mg total) by mouth  daily.   azelastine 0.1 % nasal spray Commonly known as: ASTELIN Place 2 sprays into both nostrils at bedtime as needed for rhinitis or allergies. What changed: when to take this   cholecalciferol 1000 units tablet Commonly known as: VITAMIN D Take 1,000 Units by mouth daily.   Fish Oil 1200 MG Caps Take 1,200 mg by mouth daily.   fluticasone 110 MCG/ACT inhaler Commonly known as: FLOVENT HFA Inhale 1 puff into the lungs daily.   fluticasone 50 MCG/ACT nasal spray Commonly known as: FLONASE ONE SPRAY EACH NOSTRIL TWICE A DAY IF NEEDED FOR STUFFY NOSE. What changed: See the new instructions.   hydrocortisone 2.5 % cream Apply 1 application topically daily as needed Gwynn Burly).   ketoconazole 2 % shampoo Commonly known as: NIZORAL Apply 1 application topically once a week.   metoprolol tartrate 25 MG tablet Commonly known as: LOPRESSOR Take 0.5 tablets (12.5 mg total) by mouth 2 (two) times daily.   montelukast 10 MG tablet Commonly known as: SINGULAIR TAKE 1 TABLET BY MOUTH EVERYDAY AT BEDTIME What changed: See the new instructions.   multivitamin with minerals Tabs tablet Take 1 tablet by mouth daily.   nitroGLYCERIN 0.4 MG SL tablet Commonly known as: NITROSTAT Place 1 tablet (0.4 mg total) under the tongue every 5 (five) minutes x 3 doses as needed for chest pain.   olmesartan-hydrochlorothiazide 20-12.5 MG tablet Commonly known as: BENICAR HCT Take 1 tablet by mouth daily.   pantoprazole 40 MG tablet Commonly known as: PROTONIX Take 1 tablet (40 mg total) by mouth 2 (two) times daily.   rosuvastatin 40 MG tablet Commonly known as: CRESTOR Take 1 tablet (40 mg total) by mouth every evening.   tadalafil 5 MG tablet Commonly known as: CIALIS Take 5 mg by mouth daily as needed.   tamsulosin 0.4 MG Caps capsule Commonly known as: FLOMAX Take 1 capsule (0.4 mg total) by mouth daily.   triamcinolone cream 0.1 % Commonly known as: KENALOG Apply 1  application topically daily as needed (Eczema).          Objective:   Physical Exam BP 119/73 (BP Location: Right Arm, Patient Position: Sitting, Cuff Size: Small)   Pulse (!) 55   Temp 98.4 F (36.9 C) (Oral)   Resp 16   Ht 5\' 10"  (1.778 m)   Wt 224 lb (101.6 kg)   SpO2 97%   BMI 32.14 kg/m  General:   Well developed, NAD, BMI noted. HEENT:  Normocephalic . Face symmetric, atraumatic Lungs:  CTA B Normal respiratory effort, no intercostal retractions, no accessory muscle use. Heart: RRR,  no murmur.  Lower extremities: no pretibial edema bilaterally  Skin: Not pale. Not jaundice Neurologic:  alert & oriented X3.  Speech normal, gait appropriate for age and unassisted Psych--  Cognition and judgment appear intact.  Cooperative with normal attention span and concentration.  Behavior appropriate. No anxious or depressed  appearing.      Assessment     Assessment  Prediabetes HTN Hyperlipidemia CV: Dr Angelena Form --CAD --Stroke, seen in a MRI Asthma- Dr Darcey Nora  MSK --DJD knees used to see Dr Len Childs 2016, local injections --Spinal stenosis (neck-back) - Dr Cyndy Freeze dx ~ 2008 via MRI neck, back, had local injections 2008; then 04-2015 GERD BPV, off balance (seen by neuro 2020)  GU: --Elevated PSA -- Dr Jeffie Pollock --Peyronie's  Disease Skin cancer : [facility] Dr Susie Cassette   PLAN: CAD: Had a cath 09/2019, stable findings, medical management.  See last visit, symptoms resolved. Headache: See last visit, CBC sed rate normal, symptoms resolved. RTC CPX by 03/2020     This visit occurred during the SARS-CoV-2 public health emergency.  Safety protocols were in place, including screening questions prior to the visit, additional usage of staff PPE, and extensive cleaning of exam room while observing appropriate contact time as indicated for disinfecting solutions.

## 2019-10-22 NOTE — Assessment & Plan Note (Signed)
CAD: Had a cath 09/2019, stable findings, medical management.  See last visit, symptoms resolved. Headache: See last visit, CBC sed rate normal, symptoms resolved. RTC CPX by 03/2020

## 2019-11-01 ENCOUNTER — Other Ambulatory Visit: Payer: Self-pay

## 2019-11-10 ENCOUNTER — Telehealth: Payer: PPO

## 2019-11-11 ENCOUNTER — Other Ambulatory Visit: Payer: Self-pay | Admitting: Internal Medicine

## 2019-11-14 NOTE — Progress Notes (Deleted)
CARDIOLOGY OFFICE NOTE  Date:  11/14/2019    George Alvarez [facility] Date of Birth: March 05, 1946 Medical Record #[id number]  PCP:  Colon Branch, MD  Cardiologist:  Servando Snare & ***    No chief complaint on file.   History of Present Illness: George Alvarez is a 74 y.o. male who presents today for a ***  Seen for Dr. Angelena Form.   He has a history of HTN, HLD, PVCsand CAD. Cardiac cath on11/1/13 with 95% proximal OM2 lesion and a 60% mid RCA lesion. EF was 65%. His OM2 was very large in caliber and was treated with bare-metal stents x 2.  Medical management of RCA stenosis which was felt to be moderate. He has chronic dizziness and is felt to have vertigo. Normal stress myoview December 2015. He has been limited by chronic back pain due to spinal stenosis but following back surgery in July 2019 his pain improved.He been noted to be ASA intolerant in the past but recently had ASA testing in primary care and tolerated it well.   Last seen by Dr. Angelena Form in January and was felt to be doing well - wished to stop Plavix and start ASA since he had had recent normal ASA testing and was without issues. Echo was updated - noted dilated aorta and CTA was done - no thoracic aneurysm noted.   Saw his PCP last week - endorsed chest pain at that visit - EKG noted. Had recently flew from [place] and was having on and off chest heaviness with SOB. Was recovering from URI - has history of asthma. D dimer was negative. They were there baby sitting - the kids got sick with cough and high fevers - kids were negative COVID.   The patient does not have symptoms concerning for COVID-19 infection (fever, chills, cough, or new shortness of breath).   Comes in today. Here alone. He has been vaccinated. His appointment last week had already been scheduled with PCP - noted still with some tightness in his chest - different from his asthma. Little short of breath. He has noted some shortness of breath with exertion over  the past several weeks. The tightness seemed to have started while in [place] and has persisted since coming home over the past few weeks.  He has tried to continue to do his daily walking - this has gotten harder. He notes that he is more short of breath with this. He has continued to have tightness - more of a pressure sensation - can last up to 30 minutes at a time - with exertion. No actual pain. He has not used NTG - never has. He was hoping this could be his asthma but this does act like his asthma and he admits that it feels "different". He does not remember how he felt when he had his stents.   Comes in today. Here with   Past Medical History:  Diagnosis Date  . Anal fissure   . Arthritis   . Asthma, chronic 06/16/2007   Qualifier: Diagnosis of  By: Linna Darner MD, Gwyndolyn Saxon   Onset:as child Triggers (environmental, infectious, allergic): all, mainly environmental triggers Rescue inhaler KTG:[id number] Maintenance medications/ response:Singulair,Qvar Smoking history:never Family history pulmonary disease: no    . Basal cell carcinoma of chest wall 2016   1.8 cm, treated with electrodessication and currettage  . Benign paroxysmal positional vertigo 11/19/2012   Diagnosed at [facility], [facility]  Physical therapy appointment pending   . BPH (benign  prostatic hyperplasia)    With urinary obstruction  . Coronary artery disease    a. 02/2012 Cath/PCI: LM 10, LAD min irregs, LCX large, OM1 sm, 40 ost, OM2 95p (5.0x16 Veriflex & 4.5x12 Veriflex BMS'), RCA 30p, 35m, 30d, PDA/PLA min irregs, EF 65%  . Diverticulosis   . Erectile dysfunction due to arterial insufficiency   . Fundic gland polyps of stomach, benign 2010  . GERD (gastroesophageal reflux disease)   . History of elevated PSA   . History of kidney stones   . Hyperlipidemia   . Hypertension   . Nocturia   . Perennial allergic rhinitis   . Peyronie's disease   . Skin cancer    Basal and squamous cell cancers, greater than 20  . Stroke ([place])  2016  . Tubular adenoma of colon 2010    Past Surgical History:  Procedure Laterality Date  . CORONARY ANGIOPLASTY WITH STENT PLACEMENT  02/06/2012   OM2  bare metal   . EPIDURAL BLOCK INJECTION  04-2015   Dr Brien Few  . epidural steroids  2007, 2009   X 2 @ cervical &, lumbar)  . HERNIA REPAIR    . INTRAVASCULAR ULTRASOUND  02/06/2012   Procedure: INTRAVASCULAR ULTRASOUND;  Surgeon: [name], MD;  Location: [facility] CATH LAB;  Service: Cardiovascular;;  . LEFT HEART CATH AND CORONARY ANGIOGRAPHY N/A 09/30/2019   Procedure: LEFT HEART CATH AND CORONARY ANGIOGRAPHY;  Surgeon: [name], MD;  Location: [place] CV LAB;  Service: Cardiovascular;  Laterality: N/A;  . LEFT HEART CATHETERIZATION WITH CORONARY ANGIOGRAM N/A 02/06/2012   Procedure: LEFT HEART CATHETERIZATION WITH CORONARY ANGIOGRAM;  Surgeon: [name], MD;  Location: [facility] CATH LAB;  Service: Cardiovascular;  Laterality: N/A;  . LUMBAR LAMINECTOMY/DECOMPRESSION MICRODISCECTOMY N/A 10/21/2017   Procedure: Lumbar Two-Three, Lumbar Four-Five Discectomy;  Surgeon: Ashok Pall, MD;  Location: [place];  Service: Neurosurgery;  Laterality: N/A;  . PERCUTANEOUS CORONARY STENT INTERVENTION (PCI-S)  02/06/2012   Procedure: PERCUTANEOUS CORONARY STENT INTERVENTION (PCI-S);  Surgeon: [name], MD;  Location: [facility] CATH LAB;  Service: Cardiovascular;;  . septoplasty    . TONSILLECTOMY AND ADENOIDECTOMY    . TRIGGER FINGER RELEASE    . UPPER GASTROINTESTINAL ENDOSCOPY  2011   gastric polyps  . VASECTOMY       Medications: No outpatient medications have been marked as taking for the 11/22/19 encounter (Appointment) with Burtis Junes, NP.     Allergies: Allergies  Allergen Reactions  . Nifedipine Dermatitis and Rash    edema    Social History: The patient  reports that he has never smoked. He has never used smokeless tobacco. He reports current alcohol use. He reports that he does not  use drugs.   Family History: The patient's ***family history includes Breast cancer in his maternal aunt, maternal grandmother, and mother; Dementia in his maternal grandmother; Diabetes in his maternal grandmother; Esophageal cancer in his mother; Heart attack in his maternal grandmother; Heart attack (age of onset: 85) in his father; Heart attack (age of onset: 85) in his paternal uncle; Heart disease in his mother; Stroke in his maternal grandmother.   Review of Systems: Please see the history of present illness.   All other systems are reviewed and negative.   Physical Exam: VS:  There were no vitals taken for this visit. Marland Kitchen  BMI There is no height or weight on file to calculate BMI.  Wt Readings from Last 3 Encounters:  10/20/19 224 lb (101.6 kg)  09/30/19 218 lb 6.4 oz (99.1 kg)  09/28/19 218 lb 12.8 oz (99.2 kg)    General: Pleasant. Well developed, well nourished and in no acute distress.   HEENT: Normal.  Neck: Supple, no JVD, carotid bruits, or masses noted.  Cardiac: ***Regular rate and rhythm. No murmurs, rubs, or gallops. No edema.  Respiratory:  Lungs are clear to auscultation bilaterally with normal work of breathing.  GI: Soft and nontender.  MS: No deformity or atrophy. Gait and ROM intact.  Skin: Warm and dry. Color is normal.  Neuro:  Strength and sensation are intact and no gross focal deficits noted.  Psych: Alert, appropriate and with normal affect.   LABORATORY DATA:  EKG:  EKG {ACTION; IS/IS [id number]} ordered today.  Personally reviewed by me. This demonstrates ***.  Lab Results  Component Value Date   WBC 6.9 09/28/2019   HGB 15.4 09/28/2019   HCT 44.9 09/28/2019   PLT 134 (L) 09/28/2019   GLUCOSE 127 (H) 09/28/2019   CHOL 113 04/12/2019   TRIG 187.0 (H) 04/12/2019   HDL 33.20 (L) 04/12/2019   LDLDIRECT 55.0 02/22/2018   LDLCALC 42 04/12/2019   ALT 21 09/28/2019   AST 21 09/28/2019   NA 140 09/28/2019   K 4.2 09/28/2019   CL 105 09/28/2019    CREATININE 1.16 09/28/2019   BUN 20 09/28/2019   CO2 21 09/28/2019   TSH 2.38 07/23/2017   PSA 2.53 03/12/2018   INR 1.0 02/05/2012   HGBA1C 6.1 09/23/2019   MICROALBUR 0.6 05/15/2006     BNP (last 3 results) No results for input(s): BNP in the last 8760 hours.  ProBNP (last 3 results) No results for input(s): PROBNP in the last 8760 hours.   Other Studies Reviewed Today:  LEFT HEART CATH AND CORONARY ANGIOGRAPHY 09/2019  Conclusion    Prox RCA-1 lesion is 30% stenosed.  Prox RCA-2 lesion is 60% stenosed.  Mid RCA lesion is 40% stenosed.  Mid LAD lesion is 30% stenosed.  2nd Mrg lesion is 20% stenosed.   1. Patent OM stents with minimal restenosis 2. Mild LAD disease 3. Moderate mid RCA stenosis, unchanged from last cath in 2013. This does not appear to be flow limiting.  4. Normal LV systolic function  Recommendations: Continue medical management of CAD     ECHO IMPRESSIONS 04/2019  1. Left ventricular ejection fraction, by visual estimation, is 60 to  65%. The left ventricle has normal function. There is no left ventricular  hypertrophy.  2. Left ventricular diastolic parameters are consistent with Grade I  diastolic dysfunction (impaired relaxation).  3. The left ventricle has no regional wall motion abnormalities.  4. Global right ventricle has normal systolic function.The right  ventricular size is normal.  5. Left atrial size was mildly dilated.  6. Right atrial size was normal.  7. The mitral valve is normal in structure. Trivial mitral valve  regurgitation. No evidence of mitral stenosis.  8. The tricuspid valve is normal in structure.  9. The tricuspid valve is normal in structure. Tricuspid valve  regurgitation is mild.  10. The aortic valve is tricuspid. Aortic valve regurgitation is not  visualized. Mild aortic valve sclerosis without stenosis.  11. The pulmonic valve was not well visualized. Pulmonic valve  regurgitation is  trivial.  12. Aortic dilatation noted.  13. There is mild dilatation of the ascending aorta measuring 42 mm.  14. Normal pulmonary artery systolic pressure.  15. The inferior vena cava is normal in size with  greater than 50%  respiratory variability, suggesting right atrial pressure of 3 mmHg.  16. Normal LV systolic function; grade 1 diastolic dysfunction; mildly  dilated ascending aorta; mild LAE.    Cardiac cath 02/06/12: Left main: 10% distal stenosis.  Left Anterior Descending Artery: Large caliber vessel that courses to the apex with mild luminal irregularities proximal and distal.  Circumflex Artery: Very large caliber vessel. The first OM is small and has an ostial 40% stenosis. The second OM is very large (5.52mm) and has a 95% stenosis in the proximal portion of the vessel.  Right Coronary Artery: Large, dominant artery with 30% proximal stenosis, 60% mid stenosis, diffuse 30% distal disease. The PDA and PLA are patent with mild plaque disease.  Left Ventricular Angiogram: LVEF=65%  Impression:  1. Double vessel CAD  2. Unstable angina  3. Normal LV systolic function  4. Successful PTCA/bare metal stent x 1 OM2.     Assessment/Plan:  1. Unstable angina - have recommended cardiac catheterization - The patient understands that risks include but are not limited to stroke (1 in 1000), death (1 in 1000), kidney failure [usually temporary] (1 in 500), bleeding (1 in 200), allergic reaction [possibly serious] (1 in 200), and agrees to proceed. He is planning on traveling to [place] - which I advised against - will be back next week - needs to curtail his activities in the interim - no Cialis, adding Imdur 30mg  a day. Cath arranged for next Thursday with Dr. Tamala Julian - will repeat EKG today as well. He does remain on aspirin solely. He is to seek medical attention if problems arise in the interim - he does have sl NTG on hand - reminded in how to use. EKG was repeated today - personally  reviewed by me - shows sinus brady - PVCs noted.   2. HTN - BP is good on his current regimen.   3. HLD - on statin  4. History of asthma  Current medicines are reviewed with the patient today.  The patient does not have concerns regarding medicines other than what has been noted above.  The following changes have been made:  See above.  Labs/ tests ordered today include:   No orders of the defined types were placed in this encounter.    Disposition:   FU with *** in {gen number [id number]} {Days to years:10300}.   Patient is agreeable to this plan and will call if any problems develop in the interim.   SignedTruitt Merle, NP  11/14/2019 7:36 AM  [facility] [address] [place], [place]  [address] Phone: [phone] Fax: [phone]

## 2019-11-19 ENCOUNTER — Other Ambulatory Visit: Payer: Self-pay | Admitting: Pediatrics

## 2019-11-22 ENCOUNTER — Other Ambulatory Visit: Payer: Self-pay

## 2019-11-22 ENCOUNTER — Other Ambulatory Visit: Payer: Self-pay | Admitting: Family Medicine

## 2019-11-22 ENCOUNTER — Encounter: Payer: Self-pay | Admitting: Allergy

## 2019-11-22 ENCOUNTER — Ambulatory Visit: Payer: PPO | Admitting: Nurse Practitioner

## 2019-11-22 ENCOUNTER — Ambulatory Visit (INDEPENDENT_AMBULATORY_CARE_PROVIDER_SITE_OTHER): Payer: PPO | Admitting: Allergy

## 2019-11-22 DIAGNOSIS — K219 Gastro-esophageal reflux disease without esophagitis: Secondary | ICD-10-CM

## 2019-11-22 DIAGNOSIS — J4540 Moderate persistent asthma, uncomplicated: Secondary | ICD-10-CM

## 2019-11-22 DIAGNOSIS — L2089 Other atopic dermatitis: Secondary | ICD-10-CM | POA: Diagnosis not present

## 2019-11-22 DIAGNOSIS — J3089 Other allergic rhinitis: Secondary | ICD-10-CM | POA: Diagnosis not present

## 2019-11-22 DIAGNOSIS — L2084 Intrinsic (allergic) eczema: Secondary | ICD-10-CM | POA: Insufficient documentation

## 2019-11-22 MED ORDER — MONTELUKAST SODIUM 10 MG PO TABS: 10.0000 mg | ORAL_TABLET | Freq: Every day | ORAL | 5 refills | Status: DC

## 2019-11-22 MED ORDER — FLUTICASONE PROPIONATE HFA 110 MCG/ACT IN AERO: 1.0000 | INHALATION_SPRAY | Freq: Two times a day (BID) | RESPIRATORY_TRACT | 5 refills | Status: DC

## 2019-11-22 MED ORDER — ALBUTEROL SULFATE HFA 108 (90 BASE) MCG/ACT IN AERS: 2.0000 | INHALATION_SPRAY | RESPIRATORY_TRACT | 1 refills | Status: DC | PRN

## 2019-11-22 MED ORDER — FLUTICASONE PROPIONATE 50 MCG/ACT NA SUSP: NASAL | 5 refills | Status: DC

## 2019-11-22 NOTE — Progress Notes (Signed)
RE: George Alvarez MRN: [id number] DOB: [birth year] Date of Telemedicine Visit: 11/22/2019  Referring provider: Colon Branch, MD Primary care provider: Colon Branch, MD  Chief Complaint: Asthma (Patient gave verbal consent to treat and bill insurance for this visit.)  Telemedicine Follow Up Visit via Telephone: I connected with George Alvarez for a follow up on 11/22/19 by telephone and verified that I am speaking with the correct person using two identifiers.   I discussed the limitations, risks, security and privacy concerns of performing an evaluation and management service by telephone and the availability of in person appointments. I also discussed with the patient that there may be a patient responsible charge related to this service. The patient expressed understanding and agreed to proceed.  Patient is at home/work. Provider is at the office.  Visit start time: 4:06PM Visit end time: 4:22PM Insurance consent/check in by: front desk Medical consent and medical assistant/nurse: Aileen Pilot.  History of Present Illness: He is a 74 y.o. male, who is being followed for asthma, drug allergies, atopic dermatitis and GERD. His previous allergy office visit was on 04/11/2019 with Dr. Shaune Leeks. Today is a regular follow up visit.  Asthma: Denies any SOB, coughing, wheezing, chest tightness, nocturnal awakenings, ER/urgent care visits or prednisone use since the last visit. Currently on Flovent 112mcg 1 puff twice a day and Singulair daily with good benefit.  Asthma has been well controlled this past year with wearing masks and staying away from people.   Allergic rhinitis: 2020 skin testing positive to dust mites. Taking zyrtec daily and Flonase 1 spray twice a day and sometimes using azelastine nasal spray.  No nosebleeds.   Atopic dermatitis: Follows with dermatology and using various creams with good benefit.   GERD: Well controlled with Protonix daily.   Drug  allergy: Tolerating aspirin 81mg  with no issues.   Assessment and Plan: George Alvarez is a 74 y.o. male with: Moderate persistent asthma without complication Stable with below regimen. . Daily controller medication(s): Flovent 124mcg 1 puff twice a day and rinse mouth after each use.  . Continue with montelukast 10mg  daily. . Prior to physical activity: May use albuterol rescue inhaler 2 puffs 5 to 15 minutes prior to strenuous physical activities. Marland Kitchen Rescue medications: May use albuterol rescue inhaler 2 puffs or nebulizer every 4 to 6 hours as needed for shortness of breath, chest tightness, coughing, and wheezing. Monitor frequency of use.  . During upper respiratory infections/asthma flares: Increase Flovent 174mcg to 4 puffs twice a day for 1-2 weeks until your breathing symptoms return to baseline.   Other allergic rhinitis Past history - 2020 skin testing positive to dust mites. Interim history - stable.  Continue environmental control measures.  May use over the counter antihistamines such as Zyrtec (cetirizine) 10mg  daily if needed.   May use Flonase (fluticasone) nasal spray 1 spray per nostril twice a day as needed for nasal congestion.   May use azelastine nasal spray 1-2 sprays per nostril twice a day as needed for runny nose/drainage.  Continue with montelukast 10mg  daily.   Other atopic dermatitis Follows with dermatology.  Continue medications as per your dermatologist.   Gastroesophageal reflux disease without esophagitis Stable.  Continue Protonix as prescribed.  Return in about 6 months (around 05/24/2020).  Meds ordered this encounter  Medications  . fluticasone (FLONASE) 50 MCG/ACT nasal spray    Sig: One spray each nostril twice a day if needed for stuffy nose.    Dispense:  16  g    Refill:  5  . montelukast (SINGULAIR) 10 MG tablet    Sig: Take 1 tablet (10 mg total) by mouth at bedtime.    Dispense:  30 tablet    Refill:  5  . fluticasone (FLOVENT HFA)  110 MCG/ACT inhaler    Sig: Inhale 1 puff into the lungs in the morning and at bedtime. Rinse mouth after each use.    Dispense:  1 each    Refill:  5    Place on hold until patient needs.  Marland Kitchen albuterol (VENTOLIN HFA) 108 (90 Base) MCG/ACT inhaler    Sig: Inhale 2 puffs into the lungs every 4 (four) hours as needed for wheezing or shortness of breath.    Dispense:  18 g    Refill:  1    Place on hold until patient needs.   Diagnostics: None.  Medication List:  Current Outpatient Medications  Medication Sig Dispense Refill  . albuterol (VENTOLIN HFA) 108 (90 Base) MCG/ACT inhaler Inhale 2 puffs into the lungs every 4 (four) hours as needed for wheezing or shortness of breath. 18 g 1  . aspirin EC 81 MG tablet Take 1 tablet (81 mg total) by mouth daily. 90 tablet 3  . azelastine (ASTELIN) 0.1 % nasal spray Place 2 sprays into both nostrils at bedtime as needed for rhinitis or allergies. (Patient taking differently: Place 2 sprays into both nostrils at bedtime. ) 30 mL 5  . cholecalciferol (VITAMIN D) 1000 units tablet Take 1,000 Units by mouth daily.    . fluticasone (FLONASE) 50 MCG/ACT nasal spray One spray each nostril twice a day if needed for stuffy nose. 16 g 5  . fluticasone (FLOVENT HFA) 110 MCG/ACT inhaler Inhale 1 puff into the lungs in the morning and at bedtime. Rinse mouth after each use. 1 each 5  . hydrocortisone 2.5 % cream Apply 1 application topically daily as needed Gwynn Burly).     Marland Kitchen ketoconazole (NIZORAL) 2 % shampoo Apply 1 application topically once a week.     . metoprolol tartrate (LOPRESSOR) 25 MG tablet Take 0.5 tablets (12.5 mg total) by mouth 2 (two) times daily. 90 tablet 1  . montelukast (SINGULAIR) 10 MG tablet Take 1 tablet (10 mg total) by mouth at bedtime. 30 tablet 5  . Multiple Vitamin (MULTIVITAMIN WITH MINERALS) TABS tablet Take 1 tablet by mouth daily.    . nitroGLYCERIN (NITROSTAT) 0.4 MG SL tablet Place 1 tablet (0.4 mg total) under the tongue every 5  (five) minutes x 3 doses as needed for chest pain. 25 tablet 2  . olmesartan-hydrochlorothiazide (BENICAR HCT) 20-12.5 MG tablet Take 1 tablet by mouth daily. 90 tablet 1  . Omega-3 Fatty Acids (FISH OIL) 1200 MG CAPS Take 1,200 mg by mouth daily.    . pantoprazole (PROTONIX) 40 MG tablet Take 1 tablet (40 mg total) by mouth 2 (two) times daily. 180 tablet 3  . rosuvastatin (CRESTOR) 40 MG tablet Take 1 tablet (40 mg total) by mouth every evening. 90 tablet 0  . tadalafil (CIALIS) 5 MG tablet Take 5 mg by mouth daily as needed.    . tamsulosin (FLOMAX) 0.4 MG CAPS capsule Take 1 capsule (0.4 mg total) by mouth daily. 15 capsule 0  . triamcinolone cream (KENALOG) 0.1 % Apply 1 application topically daily as needed (Eczema).      No current facility-administered medications for this visit.   Allergies: Allergies  Allergen Reactions  . Nifedipine Dermatitis and Rash  edema   I reviewed his past medical history, social history, family history, and environmental history and no significant changes have been reported from his previous visit.  Review of Systems  Constitutional: Negative for appetite change, chills, fever and unexpected weight change.  HENT: Negative for congestion and rhinorrhea.   Eyes: Negative for itching.  Respiratory: Negative for cough, chest tightness, shortness of breath and wheezing.   Gastrointestinal: Negative for abdominal pain.  Allergic/Immunologic: Positive for environmental allergies.  Neurological: Negative for headaches.   Objective: Physical Exam Not obtained as encounter was done via telephone.   Previous notes and tests were reviewed.  I discussed the assessment and treatment plan with the patient. The patient was provided an opportunity to ask questions and all were answered. The patient agreed with the plan and demonstrated an understanding of the instructions. After visit summary/patient instructions available via mychart.   The patient was  advised to call back or seek an in-person evaluation if the symptoms worsen or if the condition fails to improve as anticipated.  I provided 16 minutes of non-face-to-face time during this encounter.  It was my pleasure to participate in Liston Thum care today. Please feel free to contact me with any questions or concerns.   Sincerely,  Rexene Alberts, DO Allergy & Immunology  [facility] office: [phone] [facility] office: [place] office: [phone]

## 2019-11-22 NOTE — Assessment & Plan Note (Signed)
Follows with dermatology.  Continue medications as per your dermatologist.

## 2019-11-22 NOTE — Assessment & Plan Note (Signed)
Stable with below regimen. . Daily controller medication(s): Flovent 158mcg 1 puff twice a day and rinse mouth after each use.  . Continue with montelukast 10mg  daily. . Prior to physical activity: May use albuterol rescue inhaler 2 puffs 5 to 15 minutes prior to strenuous physical activities. Marland Kitchen Rescue medications: May use albuterol rescue inhaler 2 puffs or nebulizer every 4 to 6 hours as needed for shortness of breath, chest tightness, coughing, and wheezing. Monitor frequency of use.  . During upper respiratory infections/asthma flares: Increase Flovent 186mcg to 4 puffs twice a day for 1-2 weeks until your breathing symptoms return to baseline.

## 2019-11-22 NOTE — Assessment & Plan Note (Signed)
Stable.  Continue Protonix as prescribed.

## 2019-11-22 NOTE — Patient Instructions (Addendum)
Asthma: . Daily controller medication(s): Flovent 162mcg 1 puff twice a day and rinse mouth after each use.  . Continue with montelukast 10mg  daily. . Prior to physical activity: May use albuterol rescue inhaler 2 puffs 5 to 15 minutes prior to strenuous physical activities. Marland Kitchen Rescue medications: May use albuterol rescue inhaler 2 puffs or nebulizer every 4 to 6 hours as needed for shortness of breath, chest tightness, coughing, and wheezing. Monitor frequency of use.  . During upper respiratory infections/asthma flares: Increase Flovent 185mcg to 4 puffs twice a day for 1-2 weeks until your breathing symptoms return to baseline.  . Asthma control goals:  o Full participation in all desired activities (may need albuterol before activity) o Albuterol use two times or less a week on average (not counting use with activity) o Cough interfering with sleep two times or less a month o Oral steroids no more than once a year o No hospitalizations  Environmental allergies:  Continue environmental control measures.  May use over the counter antihistamines such as Zyrtec (cetirizine) 10mg  daily.  May use Flonase (fluticasone) nasal spray 1 spray per nostril twice a day as needed for nasal congestion.   May use azelastine nasal spray 1-2 sprays per nostril twice a day as needed for runny nose/drainage.  Continue with montelukast 10mg  daily.  Atopic dermatitis:  Continue medications as per your dermatologist.   GERD:  Continue Protonix as prescribed.   Follow up in 6 months or sooner if needed.

## 2019-11-22 NOTE — Assessment & Plan Note (Signed)
Past history - 2020 skin testing positive to dust mites. Interim history - stable.  Continue environmental control measures.  May use over the counter antihistamines such as Zyrtec (cetirizine) 10mg  daily if needed.   May use Flonase (fluticasone) nasal spray 1 spray per nostril twice a day as needed for nasal congestion.   May use azelastine nasal spray 1-2 sprays per nostril twice a day as needed for runny nose/drainage.  Continue with montelukast 10mg  daily.

## 2019-12-08 ENCOUNTER — Other Ambulatory Visit: Payer: Self-pay | Admitting: Internal Medicine

## 2019-12-09 ENCOUNTER — Telehealth: Payer: Self-pay | Admitting: Pharmacist

## 2019-12-09 NOTE — Progress Notes (Signed)
Chronic Care Management Pharmacy Assistant   Name: George Alvarez  MRN: [id number] DOB: [birth year]  Reason for Encounter: Disease State  Patient Questions:  1.  Have you seen any other providers since your last visit? Yes  2.  Any changes in your medicines or health? Yes   PCP : Colon Branch, MD   Their chronic conditions include: Asthma, HTN, HLD, CAD, Pre-Diabetes, Allergic Rhinitis, Eczema, GERD, BPH  Office Visits: 09-23-2019 (PCP) Patient presented in the office for routine check up.Patient c/o substernal heaviness on and off and very sharp pain that goes from the right eye to the back of the head, once daily for the last month. Patient was advised to take nitroglycerin every five minutes x 3 for chest pain and if not resolved to call 911. Blood work was ordered  07-29-2019 (PCP) Patient presented in the office for follow up. Blood work was ordered. No medication changes.   Consults: 09-30-2019 (Cardio) -OP procedure Patient received left cardiac cath  09-28-2019 (Cardio) Patient presented in the office for eval of chest pain. Notes indicate the patient has unstable angina, and cardiac catheterization was recommended.  09-19-2019 (Neuro) Follow up with Dr. Christella Noa   Allergies:   Allergies  Allergen Reactions  . Nifedipine Dermatitis and Rash    edema    Medications: Outpatient Encounter Medications as of 12/09/2019  Medication Sig  . albuterol (VENTOLIN HFA) 108 (90 Base) MCG/ACT inhaler Inhale 2 puffs into the lungs every 4 (four) hours as needed for wheezing or shortness of breath.  Marland Kitchen aspirin EC 81 MG tablet Take 1 tablet (81 mg total) by mouth daily.  Marland Kitchen azelastine (ASTELIN) 0.1 % nasal spray Place 2 sprays into both nostrils at bedtime as needed for rhinitis or allergies. (Patient taking differently: Place 2 sprays into both nostrils at bedtime. )  . cholecalciferol (VITAMIN D) 1000 units tablet Take 1,000 Units by mouth daily.  . fluticasone (FLONASE) 50 MCG/ACT  nasal spray One spray each nostril twice a day if needed for stuffy nose.  . fluticasone (FLOVENT HFA) 110 MCG/ACT inhaler Inhale 1 puff into the lungs in the morning and at bedtime. Rinse mouth after each use.  . hydrocortisone 2.5 % cream Apply 1 application topically daily as needed Gwynn Burly).   Marland Kitchen ketoconazole (NIZORAL) 2 % shampoo Apply 1 application topically once a week.   . metoprolol tartrate (LOPRESSOR) 25 MG tablet Take 0.5 tablets (12.5 mg total) by mouth 2 (two) times daily.  . montelukast (SINGULAIR) 10 MG tablet Take 1 tablet (10 mg total) by mouth at bedtime.  . Multiple Vitamin (MULTIVITAMIN WITH MINERALS) TABS tablet Take 1 tablet by mouth daily.  . nitroGLYCERIN (NITROSTAT) 0.4 MG SL tablet Place 1 tablet (0.4 mg total) under the tongue every 5 (five) minutes x 3 doses as needed for chest pain.  Marland Kitchen olmesartan-hydrochlorothiazide (BENICAR HCT) 20-12.5 MG tablet Take 1 tablet by mouth daily.  . Omega-3 Fatty Acids (FISH OIL) 1200 MG CAPS Take 1,200 mg by mouth daily.  . pantoprazole (PROTONIX) 40 MG tablet Take 1 tablet (40 mg total) by mouth 2 (two) times daily.  . rosuvastatin (CRESTOR) 40 MG tablet Take 1 tablet (40 mg total) by mouth every evening.  . tadalafil (CIALIS) 5 MG tablet Take 5 mg by mouth daily as needed.  . tamsulosin (FLOMAX) 0.4 MG CAPS capsule Take 1 capsule (0.4 mg total) by mouth daily.  Marland Kitchen triamcinolone cream (KENALOG) 0.1 % Apply 1 application topically daily as needed (Eczema).  No facility-administered encounter medications on file as of 12/09/2019.    Current Diagnosis: Patient Active Problem List   Diagnosis Date Noted  . Other atopic dermatitis 11/22/2019  . Chest pain of uncertain etiology   . Viral upper respiratory tract infection with cough 07/06/2017  . Current use of beta blocker 08/09/2015  . Moderate persistent asthma without complication 84/53/6468  . Other allergic rhinitis 02/20/2015  . PCP NOTES >>> 01/11/2015  . Cerebellar infarct  ([place]) 06/26/2014  . Annual physical exam 02/20/2014  . Hyperglycemia 02/05/2013  . Benign paroxysmal positional vertigo 11/19/2012  . CAD (coronary artery disease) 02/07/2012  . Gastroesophageal reflux disease without esophagitis 01/01/2009  . SKIN CANCER, HX OF 12/28/2007  . Hyperlipidemia 06/16/2007  . Essential hypertension 06/16/2007  . Asthma, chronic 06/16/2007  . Spinal stenosis 03/01/2007  . NEPHROLITHIASIS, HX OF 05/14/2006    Goals Addressed   None    Reviewed chart prior to disease state call. Spoke with patient regarding BP  Recent Office Vitals: BP Readings from Last 3 Encounters:  10/20/19 119/73  09/30/19 130/85  09/28/19 120/82   Pulse Readings from Last 3 Encounters:  10/20/19 (!) 55  09/30/19 (!) 58  09/28/19 (!) 59    Wt Readings from Last 3 Encounters:  10/20/19 224 lb (101.6 kg)  09/30/19 218 lb 6.4 oz (99.1 kg)  09/28/19 218 lb 12.8 oz (99.2 kg)     Kidney Function Lab Results  Component Value Date/Time   CREATININE 1.16 09/28/2019 10:39 AM   CREATININE 1.27 09/23/2019 02:05 PM   GFR 55.81 (L) 09/23/2019 02:05 PM   GFRNONAA 63 09/28/2019 10:39 AM   GFRAA 73 09/28/2019 10:39 AM    BMP Latest Ref Rng & Units 09/28/2019 09/23/2019 07/29/2019  Glucose 65 - 99 mg/dL 127(H) 99 126(H)  BUN 8 - 27 mg/dL 20 23 21   Creatinine 0.76 - 1.27 mg/dL 1.16 1.27 1.13  BUN/Creat Ratio 10 - 24 17 - -  Sodium 134 - 144 mmol/L 140 138 138  Potassium 3.5 - 5.2 mmol/L 4.2 4.1 4.2  Chloride 96 - 106 mmol/L 105 104 103  CO2 20 - 29 mmol/L 21 27 25   Calcium 8.6 - 10.2 mg/dL 9.6 9.7 9.7     Chronic Care Management   Outreach Note  12/13/2019 Name: George Alvarez MRN: [id number] DOB: 06-10-1945  Referred by: Colon Branch, MD Reason for referral : Chronic Care Management   Third unsuccessful telephone outreach was attempted today. The patient was referred to the pharmacist for assistance with care management and care coordination.   Follow Up Plan:   Fanny Skates, [place] Pharmacist Assistant [phone]

## 2019-12-26 NOTE — Progress Notes (Deleted)
CARDIOLOGY OFFICE NOTE  Date:  12/26/2019    George Alvarez [facility] Date of Birth: 11-Jun-1945 Medical Record #[id number]  PCP:  Colon Branch, MD  Cardiologist:  Servando Snare & ***    No chief complaint on file.   History of Present Illness: George Alvarez is a 74 y.o. male who presents today for a ***  Seen for Dr. Angelena Form.   He has a history of HTN, HLD, PVCsand CAD. Cardiac cath on11/1/13 with 95% proximal OM2 lesion and a 60% mid RCA lesion. EF was 65%. His OM2 was very large in caliber and was treated with bare-metal stents x 2.  Medical management of RCA stenosis which was felt to be moderate. He has chronic dizziness and is felt to have vertigo. Normal stress myoview December 2015. He has been limited by chronic back pain due to spinal stenosis but following back surgery in July 2019 his pain improved.He been noted to be ASA intolerant in the past but recently had ASA testing in primary care and tolerated it well.   Last seen by Dr. Angelena Form in January and was felt to be doing well - wished to stop Plavix and start ASA since he had had recent normal ASA testing and was without issues. Echo was updated - noted dilated aorta and CTA was done - no thoracic aneurysm noted.   Saw his PCP last week - endorsed chest pain at that visit - EKG noted. Had recently flew from [place] and was having on and off chest heaviness with SOB. Was recovering from URI - has history of asthma. D dimer was negative. They were there baby sitting - the kids got sick with cough and high fevers - kids were negative COVID.   The patient does not have symptoms concerning for COVID-19 infection (fever, chills, cough, or new shortness of breath).   Comes in today. Here alone. He has been vaccinated. His appointment last week had already been scheduled with PCP - noted still with some tightness in his chest - different from his asthma. Little short of breath. He has noted some shortness of breath with exertion over  the past several weeks. The tightness seemed to have started while in [place] and has persisted since coming home over the past few weeks.  He has tried to continue to do his daily walking - this has gotten harder. He notes that he is more short of breath with this. He has continued to have tightness - more of a pressure sensation - can last up to 30 minutes at a time - with exertion. No actual pain. He has not used NTG - never has. He was hoping this could be his asthma but this does act like his asthma and he admits that it feels "different". He does not remember how he felt when he had his stents.    Comes in today. Here with   Past Medical History:  Diagnosis Date  . Anal fissure   . Arthritis   . Asthma, chronic 06/16/2007   Qualifier: Diagnosis of  By: Linna Darner MD, Gwyndolyn Saxon   Onset:as child Triggers (environmental, infectious, allergic): all, mainly environmental triggers Rescue inhaler BTD:VVOHYW Maintenance medications/ response:Singulair,Qvar Smoking history:never Family history pulmonary disease: no    . Basal cell carcinoma of chest wall 2016   1.8 cm, treated with electrodessication and currettage  . Benign paroxysmal positional vertigo 11/19/2012   Diagnosed at [facility]  Physical therapy appointment pending   . BPH (  benign prostatic hyperplasia)    With urinary obstruction  . Coronary artery disease    a. 02/2012 Cath/PCI: LM 10, LAD min irregs, LCX large, OM1 sm, 40 ost, OM2 95p (5.0x16 Veriflex & 4.5x12 Veriflex BMS'), RCA 30p, 67m, 30d, PDA/PLA min irregs, EF 65%  . Diverticulosis   . Erectile dysfunction due to arterial insufficiency   . Fundic gland polyps of stomach, benign 2010  . GERD (gastroesophageal reflux disease)   . History of elevated PSA   . History of kidney stones   . Hyperlipidemia   . Hypertension   . Nocturia   . Perennial allergic rhinitis   . Peyronie's disease   . Skin cancer    Basal and squamous cell cancers, greater than 20  . Stroke  ([place]) 2016  . Tubular adenoma of colon 2010    Past Surgical History:  Procedure Laterality Date  . CORONARY ANGIOPLASTY WITH STENT PLACEMENT  02/06/2012   OM2  bare metal   . EPIDURAL BLOCK INJECTION  04-2015   Dr Brien Few  . epidural steroids  2007, 2009   X 2 @ cervical &, lumbar)  . HERNIA REPAIR    . INTRAVASCULAR ULTRASOUND  02/06/2012   Procedure: INTRAVASCULAR ULTRASOUND;  Surgeon: [name], MD;  Location: [facility] CATH LAB;  Service: Cardiovascular;;  . LEFT HEART CATH AND CORONARY ANGIOGRAPHY N/A 09/30/2019   Procedure: LEFT HEART CATH AND CORONARY ANGIOGRAPHY;  Surgeon: [name], MD;  Location: [place] CV LAB;  Service: Cardiovascular;  Laterality: N/A;  . LEFT HEART CATHETERIZATION WITH CORONARY ANGIOGRAM N/A 02/06/2012   Procedure: LEFT HEART CATHETERIZATION WITH CORONARY ANGIOGRAM;  Surgeon: [name], MD;  Location: [facility] CATH LAB;  Service: Cardiovascular;  Laterality: N/A;  . LUMBAR LAMINECTOMY/DECOMPRESSION MICRODISCECTOMY N/A 10/21/2017   Procedure: Lumbar Two-Three, Lumbar Four-Five Discectomy;  Surgeon: Ashok Pall, MD;  Location: [place];  Service: Neurosurgery;  Laterality: N/A;  . PERCUTANEOUS CORONARY STENT INTERVENTION (PCI-S)  02/06/2012   Procedure: PERCUTANEOUS CORONARY STENT INTERVENTION (PCI-S);  Surgeon: [name], MD;  Location: [facility] CATH LAB;  Service: Cardiovascular;;  . septoplasty    . TONSILLECTOMY AND ADENOIDECTOMY    . TRIGGER FINGER RELEASE    . UPPER GASTROINTESTINAL ENDOSCOPY  2011   gastric polyps  . VASECTOMY       Medications: No outpatient medications have been marked as taking for the 01/04/20 encounter (Appointment) with Burtis Junes, NP.     Allergies: Allergies  Allergen Reactions  . Nifedipine Dermatitis and Rash    edema    Social History: The patient  reports that he has never smoked. He has never used smokeless tobacco. He reports current alcohol use. He reports that he does  not use drugs.   Family History: The patient's ***family history includes Breast cancer in his maternal aunt, maternal grandmother, and mother; Dementia in his maternal grandmother; Diabetes in his maternal grandmother; Esophageal cancer in his mother; Heart attack in his maternal grandmother; Heart attack (age of onset: 95) in his father; Heart attack (age of onset: 12) in his paternal uncle; Heart disease in his mother; Stroke in his maternal grandmother.   Review of Systems: Please see the history of present illness.   All other systems are reviewed and negative.   Physical Exam: VS:  There were no vitals taken for this visit. Marland Kitchen  BMI There is no height or weight on file to calculate BMI.  Wt Readings from Last 3 Encounters:  10/20/19 224 lb (101.6  kg)  09/30/19 218 lb 6.4 oz (99.1 kg)  09/28/19 218 lb 12.8 oz (99.2 kg)    General: Pleasant. Well developed, well nourished and in no acute distress.   HEENT: Normal.  Neck: Supple, no JVD, carotid bruits, or masses noted.  Cardiac: ***Regular rate and rhythm. No murmurs, rubs, or gallops. No edema.  Respiratory:  Lungs are clear to auscultation bilaterally with normal work of breathing.  GI: Soft and nontender.  MS: No deformity or atrophy. Gait and ROM intact.  Skin: Warm and dry. Color is normal.  Neuro:  Strength and sensation are intact and no gross focal deficits noted.  Psych: Alert, appropriate and with normal affect.   LABORATORY DATA:  EKG:  EKG {ACTION; IS/IS XBM:[id number]} ordered today.  Personally reviewed by me. This demonstrates ***.  Lab Results  Component Value Date   WBC 6.9 09/28/2019   HGB 15.4 09/28/2019   HCT 44.9 09/28/2019   PLT 134 (L) 09/28/2019   GLUCOSE 127 (H) 09/28/2019   CHOL 113 04/12/2019   TRIG 187.0 (H) 04/12/2019   HDL 33.20 (L) 04/12/2019   LDLDIRECT 55.0 02/22/2018   LDLCALC 42 04/12/2019   ALT 21 09/28/2019   AST 21 09/28/2019   NA 140 09/28/2019   K 4.2 09/28/2019   CL 105  09/28/2019   CREATININE 1.16 09/28/2019   BUN 20 09/28/2019   CO2 21 09/28/2019   TSH 2.38 07/23/2017   PSA 2.53 03/12/2018   INR 1.0 02/05/2012   HGBA1C 6.1 09/23/2019   MICROALBUR 0.6 05/15/2006     BNP (last 3 results) No results for input(s): BNP in the last 8760 hours.  ProBNP (last 3 results) No results for input(s): PROBNP in the last 8760 hours.   Other Studies Reviewed Today:   Assessment/Plan:  ECHO IMPRESSIONS 04/2019  1. Left ventricular ejection fraction, by visual estimation, is 60 to  65%. The left ventricle has normal function. There is no left ventricular  hypertrophy.  2. Left ventricular diastolic parameters are consistent with Grade I  diastolic dysfunction (impaired relaxation).  3. The left ventricle has no regional wall motion abnormalities.  4. Global right ventricle has normal systolic function.The right  ventricular size is normal.  5. Left atrial size was mildly dilated.  6. Right atrial size was normal.  7. The mitral valve is normal in structure. Trivial mitral valve  regurgitation. No evidence of mitral stenosis.  8. The tricuspid valve is normal in structure.  9. The tricuspid valve is normal in structure. Tricuspid valve  regurgitation is mild.  10. The aortic valve is tricuspid. Aortic valve regurgitation is not  visualized. Mild aortic valve sclerosis without stenosis.  11. The pulmonic valve was not well visualized. Pulmonic valve  regurgitation is trivial.  12. Aortic dilatation noted.  13. There is mild dilatation of the ascending aorta measuring 42 mm.  14. Normal pulmonary artery systolic pressure.  15. The inferior vena cava is normal in size with greater than 50%  respiratory variability, suggesting right atrial pressure of 3 mmHg.  16. Normal LV systolic function; grade 1 diastolic dysfunction; mildly  dilated ascending aorta; mild LAE.    Cardiac cath 02/06/12: Left main: 10% distal stenosis.  Left Anterior  Descending Artery: Large caliber vessel that courses to the apex with mild luminal irregularities proximal and distal.  Circumflex Artery: Very large caliber vessel. The first OM is small and has an ostial 40% stenosis. The second OM is very large (5.84mm) and has a 95%  stenosis in the proximal portion of the vessel.  Right Coronary Artery: Large, dominant artery with 30% proximal stenosis, 60% mid stenosis, diffuse 30% distal disease. The PDA and PLA are patent with mild plaque disease.  Left Ventricular Angiogram: LVEF=65%  Impression:  1. Double vessel CAD  2. Unstable angina  3. Normal LV systolic function  4. Successful PTCA/bare metal stent x 1 OM2.     Assessment/Plan:  1. Unstable angina - have recommended cardiac catheterization - The patient understands that risks include but are not limited to stroke (1 in 1000), death (1 in 1000), kidney failure [usually temporary] (1 in 500), bleeding (1 in 200), allergic reaction [possibly serious] (1 in 200), and agrees to proceed. He is planning on traveling to [place] - which I advised against - will be back next week - needs to curtail his activities in the interim - no Cialis, adding Imdur 30mg  a day. Cath arranged for next Thursday with Dr. Tamala Julian - will repeat EKG today as well. He does remain on aspirin solely. He is to seek medical attention if problems arise in the interim - he does have sl NTG on hand - reminded in how to use. EKG was repeated today - personally reviewed by me - shows sinus brady - PVCs noted.   2. HTN - BP is good on his current regimen.   3. HLD - on statin  4. History of asthma  Current medicines are reviewed with the patient today.  The patient does not have concerns regarding medicines other than what has been noted above.  The following changes have been made:  See above.  Labs/ tests ordered today include:   No orders of the defined types were placed in this encounter.    Disposition:   FU with  *** in {gen number 2-06:015615} {Days to years:10300}.   Patient is agreeable to this plan and will call if any problems develop in the interim.   SignedTruitt Merle, NP  12/26/2019 10:29 AM  [facility] [address] [place], [place]  [address] Phone: [phone] Fax: [phone]

## 2019-12-28 ENCOUNTER — Encounter: Payer: Self-pay | Admitting: Internal Medicine

## 2020-01-03 ENCOUNTER — Telehealth: Payer: Self-pay | Admitting: Pharmacist

## 2020-01-03 NOTE — Progress Notes (Addendum)
Verified Adherence Gap Information. Per insurance data, the patient is 90-99% compliant with statin medication; Rosuvastatin tab 40 mg last filled 09-16-19 for 90 days.  Fanny Skates, [place] Pharmacist Assistant [phone]  Reviewed by: De Blanch, PharmD Clinical Pharmacist [facility] [phone]

## 2020-01-04 ENCOUNTER — Ambulatory Visit: Payer: PPO | Admitting: Nurse Practitioner

## 2020-01-11 ENCOUNTER — Other Ambulatory Visit: Payer: Self-pay | Admitting: Internal Medicine

## 2020-02-07 ENCOUNTER — Telehealth: Payer: Self-pay | Admitting: Pharmacist

## 2020-02-07 NOTE — Progress Notes (Signed)
Chronic Care Management Pharmacy Assistant   Name: George Alvarez  MRN: [id number] DOB: Feb 17, 1946  Reason for Encounter: HTN Disease State Call  Patient Questions:  1.  Have you seen any other providers since your last visit? Yes  2.  Any changes in your medicines or health? Yes    PCP : Colon Branch, MD   Their chronic conditions include: Asthma, HTN, HLD, CAD, Pre-Diabetes, Allergic Rhinitis, Eczema, GERD, BPH.  Office Visits: 09-23-2019 (PCP) Patient presented in the office for routine check up.Patient c/o substernal heaviness on and off and very sharp pain that goes from the righteye tothe back of the head, once daily for the last month. Patient was advised to take nitroglycerin every five minutes x 3 for chest pain and if not resolved to call 911. Blood work was ordered  07-29-2019 (PCP) Patient presented in the office for follow up. Blood work was ordered. No medication changes.  Consults: 11-22-2019 Allergist) Patient presented via telemedicine for evaluation of his asthma. He was on Flovent 154mcg 1 puff twice a day and Singulair daily at the time of the visit. Albuterol inhaler sig was changed from 2 puffs every 6 hours to every 4 hours as needed. Fluticasone Propionate sig was changed from 1 puff daily to 1 puff twice a day.  10-20-2019 (Cardio) Patient presented in the office for f/u. Patient was instructed to STOP isosorbide mononitrate 30 mg.   10-17-2019 (Derm) OV with Dr. Sammuel Hines  09-30-2019 (Cardio) -OP procedure Patient received left cardiac cath  09-28-2019 (Cardio) Patient presented in the office for eval of chest pain. Notes indicate the patient has unstable angina, and cardiac catheterization was recommended.  09-19-2019 (Neuro) Follow up with Dr. Christella Noa  Allergies:   Allergies  Allergen Reactions  . Nifedipine Dermatitis and Rash    edema    Medications: Outpatient Encounter Medications as of 02/07/2020  Medication Sig  . albuterol (VENTOLIN  HFA) 108 (90 Base) MCG/ACT inhaler Inhale 2 puffs into the lungs every 4 (four) hours as needed for wheezing or shortness of breath.  Marland Kitchen aspirin EC 81 MG tablet Take 1 tablet (81 mg total) by mouth daily.  Marland Kitchen azelastine (ASTELIN) 0.1 % nasal spray Place 2 sprays into both nostrils at bedtime as needed for rhinitis or allergies. (Patient taking differently: Place 2 sprays into both nostrils at bedtime. )  . cholecalciferol (VITAMIN D) 1000 units tablet Take 1,000 Units by mouth daily.  . fluticasone (FLONASE) 50 MCG/ACT nasal spray One spray each nostril twice a day if needed for stuffy nose.  . fluticasone (FLOVENT HFA) 110 MCG/ACT inhaler Inhale 1 puff into the lungs in the morning and at bedtime. Rinse mouth after each use.  . hydrocortisone 2.5 % cream Apply 1 application topically daily as needed Gwynn Burly).   Marland Kitchen ketoconazole (NIZORAL) 2 % shampoo Apply 1 application topically once a week.   . metoprolol tartrate (LOPRESSOR) 25 MG tablet Take 0.5 tablets (12.5 mg total) by mouth 2 (two) times daily.  . montelukast (SINGULAIR) 10 MG tablet Take 1 tablet (10 mg total) by mouth at bedtime.  . Multiple Vitamin (MULTIVITAMIN WITH MINERALS) TABS tablet Take 1 tablet by mouth daily.  . nitroGLYCERIN (NITROSTAT) 0.4 MG SL tablet Place 1 tablet (0.4 mg total) under the tongue every 5 (five) minutes x 3 doses as needed for chest pain.  Marland Kitchen olmesartan-hydrochlorothiazide (BENICAR HCT) 20-12.5 MG tablet Take 1 tablet by mouth daily.  . Omega-3 Fatty Acids (FISH OIL) 1200 MG CAPS  Take 1,200 mg by mouth daily.  . pantoprazole (PROTONIX) 40 MG tablet Take 1 tablet (40 mg total) by mouth 2 (two) times daily.  . rosuvastatin (CRESTOR) 40 MG tablet Take 1 tablet (40 mg total) by mouth every evening.  . tadalafil (CIALIS) 5 MG tablet Take 5 mg by mouth daily as needed.  . tamsulosin (FLOMAX) 0.4 MG CAPS capsule Take 1 capsule (0.4 mg total) by mouth daily.  Marland Kitchen triamcinolone cream (KENALOG) 0.1 % Apply 1 application  topically daily as needed (Eczema).    No facility-administered encounter medications on file as of 02/07/2020.    Current Diagnosis: Patient Active Problem List   Diagnosis Date Noted  . Other atopic dermatitis 11/22/2019  . Chest pain of uncertain etiology   . Viral upper respiratory tract infection with cough 07/06/2017  . Current use of beta blocker 08/09/2015  . Moderate persistent asthma without complication 16/01/9603  . Other allergic rhinitis 02/20/2015  . PCP NOTES >>> 01/11/2015  . Cerebellar infarct ([place]) 06/26/2014  . Annual physical exam 02/20/2014  . Hyperglycemia 02/05/2013  . Benign paroxysmal positional vertigo 11/19/2012  . CAD (coronary artery disease) 02/07/2012  . Gastroesophageal reflux disease without esophagitis 01/01/2009  . SKIN CANCER, HX OF 12/28/2007  . Hyperlipidemia 06/16/2007  . Essential hypertension 06/16/2007  . Asthma, chronic 06/16/2007  . Spinal stenosis 03/01/2007  . NEPHROLITHIASIS, HX OF 05/14/2006    Goals Addressed   None     Reviewed chart prior to disease state call. Spoke with patient regarding BP  Recent Office Vitals: BP Readings from Last 3 Encounters:  10/20/19 119/73  09/30/19 130/85  09/28/19 120/82   Pulse Readings from Last 3 Encounters:  10/20/19 (!) 55  09/30/19 (!) 58  09/28/19 (!) 59    Wt Readings from Last 3 Encounters:  10/20/19 224 lb (101.6 kg)  09/30/19 218 lb 6.4 oz (99.1 kg)  09/28/19 218 lb 12.8 oz (99.2 kg)     Kidney Function Lab Results  Component Value Date/Time   CREATININE 1.16 09/28/2019 10:39 AM   CREATININE 1.27 09/23/2019 02:05 PM   GFR 55.81 (L) 09/23/2019 02:05 PM   GFRNONAA 63 09/28/2019 10:39 AM   GFRAA 73 09/28/2019 10:39 AM    BMP Latest Ref Rng & Units 09/28/2019 09/23/2019 07/29/2019  Glucose 65 - 99 mg/dL 127(H) 99 126(H)  BUN 8 - 27 mg/dL 20 23 21   Creatinine 0.76 - 1.27 mg/dL 1.16 1.27 1.13  BUN/Creat Ratio 10 - 24 17 - -  Sodium 134 - 144 mmol/L 140 138 138    Potassium 3.5 - 5.2 mmol/L 4.2 4.1 4.2  Chloride 96 - 106 mmol/L 105 104 103  CO2 20 - 29 mmol/L 21 27 25   Calcium 8.6 - 10.2 mg/dL 9.6 9.7 9.7    . Current antihypertensive regimen:  o metoprolol 25mg  1/2 tab twice daily o  olmesartan-hctz 20-12.5mg  one daily   Chronic Care Management   Outreach Note  02/10/2020 Name: NATASHA PAULSON MRN: [id number] DOB: 1946/02/16  Referred by: Colon Branch, MD Reason for referral : Chronic Care Management   Third unsuccessful telephone outreach was attempted today. The patient was referred to the pharmacist for assistance with care management and care coordination. Patient has missed scheduled appointments within the last six months.Unable to make a follow up appointment with the clinical pharmacist.   Follow-Up:  Pharmacist Review   Fanny Skates, [place] Pharmacist Assistant [phone]

## 2020-02-08 ENCOUNTER — Encounter: Payer: Self-pay | Admitting: Internal Medicine

## 2020-02-09 ENCOUNTER — Telehealth (INDEPENDENT_AMBULATORY_CARE_PROVIDER_SITE_OTHER): Payer: PPO | Admitting: Internal Medicine

## 2020-02-09 ENCOUNTER — Encounter: Payer: Self-pay | Admitting: Internal Medicine

## 2020-02-09 ENCOUNTER — Other Ambulatory Visit: Payer: Self-pay

## 2020-02-09 VITALS — Temp 99.3°F | Ht 70.0 in | Wt 224.0 lb

## 2020-02-09 DIAGNOSIS — J4541 Moderate persistent asthma with (acute) exacerbation: Secondary | ICD-10-CM

## 2020-02-09 MED ORDER — AZITHROMYCIN 250 MG PO TABS
ORAL_TABLET | ORAL | 0 refills | Status: DC
Start: 2020-02-09 — End: 2020-03-26

## 2020-02-09 MED ORDER — PREDNISONE 10 MG PO TABS
ORAL_TABLET | ORAL | 0 refills | Status: DC
Start: 2020-02-09 — End: 2020-03-26

## 2020-02-09 MED ORDER — HYDROCODONE-HOMATROPINE 5-1.5 MG/5ML PO SYRP: 5.0000 mL | ORAL_SOLUTION | Freq: Three times a day (TID) | ORAL | 0 refills | Status: DC | PRN

## 2020-02-09 NOTE — Progress Notes (Signed)
Subjective:    Patient ID: George Alvarez, male    DOB: 1945/07/18, 74 y.o.   MRN: [id number]  DOS:  02/09/2020 Type of visit - description: Virtual Visit via Video Note  I connected with the above patient  by a video enabled telemedicine application and verified that I am speaking with the correct person using two identifiers.   THIS ENCOUNTER IS A VIRTUAL VISIT DUE TO COVID-19 - PATIENT WAS NOT SEEN IN THE OFFICE. PATIENT HAS CONSENTED TO VIRTUAL VISIT / TELEMEDICINE VISIT   Location of patient: home  Location of provider: office  Persons participating in the virtual visit: patient, provider   I discussed the limitations of evaluation and management by telemedicine and the availability of in person appointments. The patient expressed understanding and agreed to proceed.  Acute The patient developed a mild cough for about a week. 2 days ago he had the Moderna Covid vaccine booster and few hours later he developed a number of symptoms: Increased cough, malaise, aches and pains, wheezing, greenish sputum production.  He feels chilly but has checked his temperature and is normal. No nausea, vomiting, diarrhea. No chest pain Mild sinus congestion  Review of Systems See above   Past Medical History:  Diagnosis Date  . Anal fissure   . Arthritis   . Asthma, chronic 06/16/2007   Qualifier: Diagnosis of  By: Linna Darner MD, Gwyndolyn Saxon   Onset:as child Triggers (environmental, infectious, allergic): all, mainly environmental triggers Rescue inhaler UTM:LYYTKP Maintenance medications/ response:Singulair,Qvar Smoking history:never Family history pulmonary disease: no    . Basal cell carcinoma of chest wall 2016   1.8 cm, treated with electrodessication and currettage  . Benign paroxysmal positional vertigo 11/19/2012   Diagnosed at [facility]  Physical therapy appointment pending   . BPH (benign prostatic hyperplasia)    With urinary obstruction  . Coronary artery disease     a. 02/2012 Cath/PCI: LM 10, LAD min irregs, LCX large, OM1 sm, 40 ost, OM2 95p (5.0x16 Veriflex & 4.5x12 Veriflex BMS'), RCA 30p, 51m, 30d, PDA/PLA min irregs, EF 65%  . Diverticulosis   . Erectile dysfunction due to arterial insufficiency   . Fundic gland polyps of stomach, benign 2010  . GERD (gastroesophageal reflux disease)   . History of elevated PSA   . History of kidney stones   . Hyperlipidemia   . Hypertension   . Nocturia   . Perennial allergic rhinitis   . Peyronie's disease   . Skin cancer    Basal and squamous cell cancers, greater than 20  . Stroke ([place]) 2016  . Tubular adenoma of colon 2010    Past Surgical History:  Procedure Laterality Date  . CORONARY ANGIOPLASTY WITH STENT PLACEMENT  02/06/2012   OM2  bare metal   . EPIDURAL BLOCK INJECTION  04-2015   Dr Brien Few  . epidural steroids  2007, 2009   X 2 @ cervical &, lumbar)  . HERNIA REPAIR    . INTRAVASCULAR ULTRASOUND  02/06/2012   Procedure: INTRAVASCULAR ULTRASOUND;  Surgeon: [name], MD;  Location: [facility] CATH LAB;  Service: Cardiovascular;;  . LEFT HEART CATH AND CORONARY ANGIOGRAPHY N/A 09/30/2019   Procedure: LEFT HEART CATH AND CORONARY ANGIOGRAPHY;  Surgeon: [name], MD;  Location: [place] CV LAB;  Service: Cardiovascular;  Laterality: N/A;  . LEFT HEART CATHETERIZATION WITH CORONARY ANGIOGRAM N/A 02/06/2012   Procedure: LEFT HEART CATHETERIZATION WITH CORONARY ANGIOGRAM;  Surgeon: [name], MD;  Location: [facility]  CATH LAB;  Service: Cardiovascular;  Laterality: N/A;  . LUMBAR LAMINECTOMY/DECOMPRESSION MICRODISCECTOMY N/A 10/21/2017   Procedure: Lumbar Two-Three, Lumbar Four-Five Discectomy;  Surgeon: Ashok Pall, MD;  Location: [place];  Service: Neurosurgery;  Laterality: N/A;  . PERCUTANEOUS CORONARY STENT INTERVENTION (PCI-S)  02/06/2012   Procedure: PERCUTANEOUS CORONARY STENT INTERVENTION (PCI-S);  Surgeon: [name], MD;  Location: [facility] CATH LAB;   Service: Cardiovascular;;  . septoplasty    . TONSILLECTOMY AND ADENOIDECTOMY    . TRIGGER FINGER RELEASE    . UPPER GASTROINTESTINAL ENDOSCOPY  2011   gastric polyps  . VASECTOMY      Allergies as of 02/09/2020      Reactions   Nifedipine Dermatitis, Rash   edema      Medication List       Accurate as of February 09, 2020 11:59 PM. If you have any questions, ask your nurse or doctor.        albuterol 108 (90 Base) MCG/ACT inhaler Commonly known as: VENTOLIN HFA Inhale 2 puffs into the lungs every 4 (four) hours as needed for wheezing or shortness of breath.   aspirin EC 81 MG tablet Take 1 tablet (81 mg total) by mouth daily.   azelastine 0.1 % nasal spray Commonly known as: ASTELIN Place 2 sprays into both nostrils at bedtime as needed for rhinitis or allergies. What changed: when to take this   azithromycin 250 MG tablet Commonly known as: Zithromax Z-Pak 2 tabs a day the first day, then 1 tab a day x 4 days Started by: Kathlene November, MD   cholecalciferol 1000 units tablet Commonly known as: VITAMIN D Take 1,000 Units by mouth daily.   Fish Oil 1200 MG Caps Take 1,200 mg by mouth daily.   fluticasone 110 MCG/ACT inhaler Commonly known as: FLOVENT HFA Inhale 1 puff into the lungs in the morning and at bedtime. Rinse mouth after each use.   fluticasone 50 MCG/ACT nasal spray Commonly known as: FLONASE One spray each nostril twice a day if needed for stuffy nose.   HYDROcodone-homatropine 5-1.5 MG/5ML syrup Commonly known as: HYCODAN Take 5 mLs by mouth 3 (three) times daily as needed for cough. Started by: Kathlene November, MD   hydrocortisone 2.5 % cream Apply 1 application topically daily as needed Gwynn Burly).   ketoconazole 2 % shampoo Commonly known as: NIZORAL Apply 1 application topically once a week.   metoprolol tartrate 25 MG tablet Commonly known as: LOPRESSOR Take 0.5 tablets (12.5 mg total) by mouth 2 (two) times daily.   montelukast 10 MG  tablet Commonly known as: SINGULAIR Take 1 tablet (10 mg total) by mouth at bedtime.   multivitamin with minerals Tabs tablet Take 1 tablet by mouth daily.   nitroGLYCERIN 0.4 MG SL tablet Commonly known as: NITROSTAT Place 1 tablet (0.4 mg total) under the tongue every 5 (five) minutes x 3 doses as needed for chest pain.   olmesartan-hydrochlorothiazide 20-12.5 MG tablet Commonly known as: BENICAR HCT Take 1 tablet by mouth daily.   pantoprazole 40 MG tablet Commonly known as: PROTONIX Take 1 tablet (40 mg total) by mouth 2 (two) times daily.   predniSONE 10 MG tablet Commonly known as: DELTASONE 2 tabs a day x 5 days Started by: Kathlene November, MD   rosuvastatin 40 MG tablet Commonly known as: CRESTOR Take 1 tablet (40 mg total) by mouth every evening.   tadalafil 5 MG tablet Commonly known as: CIALIS Take 5 mg by mouth daily as needed.  tamsulosin 0.4 MG Caps capsule Commonly known as: FLOMAX Take 1 capsule (0.4 mg total) by mouth daily.   triamcinolone cream 0.1 % Commonly known as: KENALOG Apply 1 application topically daily as needed (Eczema).          Objective:   Physical Exam Temp 99.3 F (37.4 C) (Oral)   Ht 5\' 10"  (1.778 m)   Wt 224 lb (101.6 kg)   SpO2 96%   BMI 32.14 kg/m  This is a virtual video visit, he is alert oriented x3, he is not in any acute distress but frequent cough is not is associated with large airway congestion and some wheezing.    Assessment      Assessment  Prediabetes HTN Hyperlipidemia CV: Dr Angelena Form --CAD --Stroke, seen in a MRI Asthma- Dr Darcey Nora  [facility] --DJD knees used to see Dr Len Childs 2016, local injections --Spinal stenosis (neck-back) - Dr Cyndy Freeze dx ~ 2008 via MRI neck, back, had local injections 2008; then 04-2015 GERD BPV, off balance (seen by neuro 2020)  GU: --Elevated PSA -- Dr Jeffie Pollock --Peyronie's  Disease Skin cancer : [facility] Dr Susie Cassette   PLAN: Asthma exacerbation sxs c/w asthma exacerbation, possibly  from a bacterial or a viral infection (Covid?). Giveng timing, ? reaction to the Covid vaccine booster. He recently came back from [place], symptoms do not suggest PE. At this point we agreed on the following: Check a Covid test-- already scheduled for this afternoon Increase Flovent to 2 puffs twice a day temporarily Use rescue inhaler as needed Z-Pak Cough control with hydrocodone 3 times a day, watch for drowsiness Take prednisone in few days only if he is not better If not improving will proceed with further evaluation including chest x-ray and blood work. If symptoms severe: ER. Detailed instructions sent   I discussed the assessment and treatment plan with the patient. The patient was provided an opportunity to ask questions and all were answered. The patient agreed with the plan and demonstrated an understanding of the instructions.   The patient was advised to call back or seek an in-person evaluation if the symptoms worsen or if the condition fails to improve as anticipated.

## 2020-02-09 NOTE — Progress Notes (Signed)
Pre visit review using our clinic review tool, if applicable. No additional management support is needed unless otherwise documented below in the visit note. 

## 2020-02-10 ENCOUNTER — Telehealth: Payer: Self-pay | Admitting: Pharmacist

## 2020-02-10 DIAGNOSIS — Z20822 Contact with and (suspected) exposure to covid-19: Secondary | ICD-10-CM | POA: Diagnosis not present

## 2020-02-10 NOTE — Progress Notes (Addendum)
Chronic Care Management Pharmacy Assistant   Name: George Alvarez  MRN: [id number] DOB: 10/19/1945  Reason for Encounter: HTN Disease State Call  Patient Questions:  1.  Have you seen any other providers since your last visit? Yes  2.  Any changes in your medicines or health? Yes    PCP : Colon Branch, MD   Their chronic conditions include: Asthma, HTN, HLD, CAD, Pre-Diabetes, Allergic Rhinitis, Eczema, GERD, BPH.  Office Visits: 09-23-2019 (PCP) Patient presented in the office for routine check up.Patient c/osubsternal heaviness on and offandvery sharp pain that goes from the righteye tothe back of the head, once daily for the last month.Patient was advised to take nitroglycerin every five minutes x 3 for chest pain and if not resolved to call 911. Blood work was ordered  07-29-2019 (PCP) Patient presented in the office for follow up. Blood work was ordered. No medication changes.  Consults: 11-22-2019 Allergist) Patient presented via telemedicine for evaluation of his asthma. He was on Flovent 118mcg 1 puff twice a day and Singulair daily at the time of the visit. Albuterol inhaler sig was changed from 2 puffs every 6 hours to every 4 hours as needed. Fluticasone Propionate sig was changed from 1 puff daily to 1 puff twice a day.  10-20-2019 (Cardio) Patient presented in the office for f/u. Patient was instructed to STOP isosorbide mononitrate 30 mg.   10-17-2019 (Derm) OV with Dr. Sammuel Hines  09-30-2019 (Cardio) -OP procedure Patient received left cardiac cath  09-28-2019 (Cardio) Patient presented in the office for eval of chest pain. Notes indicate the patient has unstable angina, and cardiac catheterization was recommended.  09-19-2019 (Neuro) Follow up with Dr. Christella Noa  Allergies:   Allergies  Allergen Reactions  . Nifedipine Dermatitis and Rash    edema    Medications: Outpatient Encounter Medications as of 02/10/2020  Medication Sig Note  . albuterol  (VENTOLIN HFA) 108 (90 Base) MCG/ACT inhaler Inhale 2 puffs into the lungs every 4 (four) hours as needed for wheezing or shortness of breath.   Marland Kitchen aspirin EC 81 MG tablet Take 1 tablet (81 mg total) by mouth daily.   Marland Kitchen azelastine (ASTELIN) 0.1 % nasal spray Place 2 sprays into both nostrils at bedtime as needed for rhinitis or allergies. (Patient taking differently: Place 2 sprays into both nostrils at bedtime. )   . azithromycin (ZITHROMAX Z-PAK) 250 MG tablet 2 tabs a day the first day, then 1 tab a day x 4 days   . cholecalciferol (VITAMIN D) 1000 units tablet Take 1,000 Units by mouth daily.   . fluticasone (FLONASE) 50 MCG/ACT nasal spray One spray each nostril twice a day if needed for stuffy nose.   . fluticasone (FLOVENT HFA) 110 MCG/ACT inhaler Inhale 1 puff into the lungs in the morning and at bedtime. Rinse mouth after each use.   Marland Kitchen HYDROcodone-homatropine (HYCODAN) 5-1.5 MG/5ML syrup Take 5 mLs by mouth 3 (three) times daily as needed for cough.   . hydrocortisone 2.5 % cream Apply 1 application topically daily as needed Gwynn Burly).    Marland Kitchen ketoconazole (NIZORAL) 2 % shampoo Apply 1 application topically once a week.    . metoprolol tartrate (LOPRESSOR) 25 MG tablet Take 0.5 tablets (12.5 mg total) by mouth 2 (two) times daily.   . montelukast (SINGULAIR) 10 MG tablet Take 1 tablet (10 mg total) by mouth at bedtime.   . Multiple Vitamin (MULTIVITAMIN WITH MINERALS) TABS tablet Take 1 tablet by mouth daily.   Marland Kitchen  nitroGLYCERIN (NITROSTAT) 0.4 MG SL tablet Place 1 tablet (0.4 mg total) under the tongue every 5 (five) minutes x 3 doses as needed for chest pain. (Patient not taking: Reported on 02/09/2020) 02/09/2020: PRN  . olmesartan-hydrochlorothiazide (BENICAR HCT) 20-12.5 MG tablet Take 1 tablet by mouth daily.   . Omega-3 Fatty Acids (FISH OIL) 1200 MG CAPS Take 1,200 mg by mouth daily.   . pantoprazole (PROTONIX) 40 MG tablet Take 1 tablet (40 mg total) by mouth 2 (two) times daily.   .  predniSONE (DELTASONE) 10 MG tablet 2 tabs a day x 5 days   . rosuvastatin (CRESTOR) 40 MG tablet Take 1 tablet (40 mg total) by mouth every evening.   . tadalafil (CIALIS) 5 MG tablet Take 5 mg by mouth daily as needed.   . tamsulosin (FLOMAX) 0.4 MG CAPS capsule Take 1 capsule (0.4 mg total) by mouth daily.   Marland Kitchen triamcinolone cream (KENALOG) 0.1 % Apply 1 application topically daily as needed (Eczema).     No facility-administered encounter medications on file as of 02/10/2020.    Current Diagnosis: Patient Active Problem List   Diagnosis Date Noted  . Other atopic dermatitis 11/22/2019  . Chest pain of uncertain etiology   . Viral upper respiratory tract infection with cough 07/06/2017  . Current use of beta blocker 08/09/2015  . Moderate persistent asthma without complication 84/69/6295  . Other allergic rhinitis 02/20/2015  . PCP NOTES >>> 01/11/2015  . Cerebellar infarct ([place]) 06/26/2014  . Annual physical exam 02/20/2014  . Hyperglycemia 02/05/2013  . Benign paroxysmal positional vertigo 11/19/2012  . CAD (coronary artery disease) 02/07/2012  . Gastroesophageal reflux disease without esophagitis 01/01/2009  . SKIN CANCER, HX OF 12/28/2007  . Hyperlipidemia 06/16/2007  . Essential hypertension 06/16/2007  . Asthma, chronic 06/16/2007  . Spinal stenosis 03/01/2007  . NEPHROLITHIASIS, HX OF 05/14/2006    Goals Addressed   None    Reviewed chart prior to disease state call. Spoke with patient regarding BP  Recent Office Vitals: BP Readings from Last 3 Encounters:  10/20/19 119/73  09/30/19 130/85  09/28/19 120/82   Pulse Readings from Last 3 Encounters:  10/20/19 (!) 55  09/30/19 (!) 58  09/28/19 (!) 59    Wt Readings from Last 3 Encounters:  02/09/20 224 lb (101.6 kg)  10/20/19 224 lb (101.6 kg)  09/30/19 218 lb 6.4 oz (99.1 kg)     Kidney Function Lab Results  Component Value Date/Time   CREATININE 1.16 09/28/2019 10:39 AM   CREATININE 1.27 09/23/2019  02:05 PM   GFR 55.81 (L) 09/23/2019 02:05 PM   GFRNONAA 63 09/28/2019 10:39 AM   GFRAA 73 09/28/2019 10:39 AM    BMP Latest Ref Rng & Units 09/28/2019 09/23/2019 07/29/2019  Glucose 65 - 99 mg/dL 127(H) 99 126(H)  BUN 8 - 27 mg/dL 20 23 21   Creatinine 0.76 - 1.27 mg/dL 1.16 1.27 1.13  BUN/Creat Ratio 10 - 24 17 - -  Sodium 134 - 144 mmol/L 140 138 138  Potassium 3.5 - 5.2 mmol/L 4.2 4.1 4.2  Chloride 96 - 106 mmol/L 105 104 103  CO2 20 - 29 mmol/L 21 27 25   Calcium 8.6 - 10.2 mg/dL 9.6 9.7 9.7    . Current antihypertensive regimen:  ? metoprolol 25mg  1/2 tab twice daily ?  olmesartan-hctz 20-12.5mg  one daily  . How often are you checking your Blood Pressure? weekly   . Current home BP readings: 130/80, 110/62, 128/75 are the last couple of reading  he provided. Patient states his blood pressure overall has been good with a systolic of no more than 037 and a diastolic no more that 80.  . What recent interventions/DTPs have been made by any provider to improve Blood Pressure control since last CPP Visit: none at this time  . Any recent hospitalizations or ED visits since last visit with CPP? No   . What diet changes have been made to improve Blood Pressure Control?  o Patient states his wife ensures that he is eating balanced meals  . What exercise is being done to improve your Blood Pressure Control?  o Patient states him and his wife are active walking a few times a week and participate in silver sneakers.  Adherence Review: Is the patient currently on ACE/ARB medication? Yes  olmesartan-hctz 20-12.5mg   Does the patient have >5 day gap between last estimated fill dates? No    Follow-Up:  Pharmacist Review and Scheduled Follow-Up With Clinical Pharmacist  For Wednesday December 15th at 9 am over the telephone  Fanny Skates, [place] Pharmacist Assistant [phone]  Reviewed by: De Blanch, PharmD Clinical Pharmacist [facility] [phone]

## 2020-02-11 NOTE — Assessment & Plan Note (Signed)
Asthma exacerbation sxs c/w asthma exacerbation, possibly from a bacterial or a viral infection (Covid?). Giveng timing, ? reaction to the Covid vaccine booster. He recently came back from [place], symptoms do not suggest PE. At this point we agreed on the following: Check a Covid test-- already scheduled for this afternoon Increase Flovent to 2 puffs twice a day temporarily Use rescue inhaler as needed Z-Pak Cough control with hydrocodone 3 times a day, watch for drowsiness Take prednisone in few days only if he is not better If not improving will proceed with further evaluation including chest x-ray and blood work. If symptoms severe: ER. Detailed instructions sent

## 2020-03-08 ENCOUNTER — Encounter: Payer: Self-pay | Admitting: Internal Medicine

## 2020-03-13 DIAGNOSIS — N401 Enlarged prostate with lower urinary tract symptoms: Secondary | ICD-10-CM | POA: Diagnosis not present

## 2020-03-13 LAB — PSA: PSA: 2.47

## 2020-03-21 ENCOUNTER — Ambulatory Visit: Payer: PPO | Admitting: Pharmacist

## 2020-03-21 DIAGNOSIS — I10 Essential (primary) hypertension: Secondary | ICD-10-CM

## 2020-03-21 DIAGNOSIS — N5201 Erectile dysfunction due to arterial insufficiency: Secondary | ICD-10-CM | POA: Diagnosis not present

## 2020-03-21 DIAGNOSIS — E785 Hyperlipidemia, unspecified: Secondary | ICD-10-CM

## 2020-03-21 DIAGNOSIS — N401 Enlarged prostate with lower urinary tract symptoms: Secondary | ICD-10-CM | POA: Diagnosis not present

## 2020-03-21 DIAGNOSIS — R739 Hyperglycemia, unspecified: Secondary | ICD-10-CM

## 2020-03-21 DIAGNOSIS — R351 Nocturia: Secondary | ICD-10-CM | POA: Diagnosis not present

## 2020-03-21 DIAGNOSIS — N3941 Urge incontinence: Secondary | ICD-10-CM | POA: Diagnosis not present

## 2020-03-21 DIAGNOSIS — R972 Elevated prostate specific antigen [PSA]: Secondary | ICD-10-CM | POA: Diagnosis not present

## 2020-03-21 NOTE — Patient Instructions (Signed)
Visit Information  Goals Addressed            This Visit's Progress   . Chronic Care Management Pharmacy Care Plan       CARE PLAN ENTRY (see longitudinal plan of care for additional care plan information)  Current Barriers:  . Chronic Disease Management support, education, and care coordination needs related to Asthma, HTN, HLD, CAD, Pre-Diabetes, Allergic Rhinitis, Eczema, GERD, BPH   Hypertension BP Readings from Last 3 Encounters:  10/20/19 119/73  09/30/19 130/85  09/28/19 120/82   . Pharmacist Clinical Goal(s): o Over the next 180 days, patient will work with PharmD and providers to maintain BP goal <130/80 . Current regimen:   Metoprolol Tartrate 25mg  1/2 tab twice daily  Olmesartan-hctz 20-12.5mg  one daily . Patient self care activities - Over the next 180 days, patient will: o Check BP 1-2 times per week, document, and provide at future appointments o Ensure daily salt intake < 2300 mg/Chuong Casebeer o Maintain hypertension medication regimen.   Hyperlipidemia/CAD Lab Results  Component Value Date/Time   LDLCALC 42 04/12/2019 08:01 AM   LDLDIRECT 55.0 02/22/2018 10:18 AM   . Pharmacist Clinical Goal(s): o Over the next 180 days, patient will work with PharmD and providers to maintain LDL goal < 70 . Current regimen:   Rosuvastatin 40mg  once daily  Fish oil 1200mg  daily   Aspirin 81mg  daily . Patient self care activities - Over the next 180 days, patient will: o Maintain cholesterol medication regimen.   Pre-Diabetes Lab Results  Component Value Date/Time   HGBA1C 6.1 09/23/2019 02:05 PM   HGBA1C 6.1 04/12/2019 08:01 AM   HGBA1C 5.8 11/04/2018 12:00 AM   . Pharmacist Clinical Goal(s): o Over the next 180 days, patient will work with PharmD and providers to maintain A1c goal <6.5% . Current regimen:  o Diet and exercise management   . Patient self care activities - Over the next 180 days, patient will: o Maintain a1c <6.5%  Medication  management . Pharmacist Clinical Goal(s): o Over the next 180 days, patient will work with PharmD and providers to maintain optimal medication adherence . Current pharmacy: [facility] . Interventions o Comprehensive medication review performed. o Continue current medication management strategy . Patient self care activities - Over the next 180 days, patient will: o Focus on medication adherence by filling and taking medications appropriately  o Take medications as prescribed o Report any questions or concerns to PharmD and/or provider(s)  Please see past updates related to this goal by clicking on the "Past Updates" button in the selected goal         The patient verbalized understanding of instructions, educational materials, and care plan provided today and agreed to receive a mailed copy of patient instructions, educational materials, and care plan.   Telephone follow up appointment with pharmacy team member scheduled for: 09/19/2020  Melvenia Beam Jenia Klepper, [facility]

## 2020-03-21 NOTE — Chronic Care Management (AMB) (Signed)
Chronic Care Management Pharmacy  Name: George Alvarez  MRN: [id number] DOB: Feb 10, 1946   Chief Complaint/ HPI  George Alvarez,  74 y.o. , male presents for their Follow-Up CCM visit with the clinical pharmacist via telephone.  PCP : Colon Branch, MD  Their chronic conditions include: Asthma, HTN, HLD, CAD, Pre-Diabetes, Allergic Rhinitis, Eczema, GERD, BPH  Office Visits: 03/25/19: Office visit w/ Dr. Larose Kells - Labs drawn (a1c, lipid) arranged to aspirin allergy rechallenge.  02/23/19: Office visit w/ Dr. Larose Kells - Hoarseness (ENT referral), Gait unsteadiness (Neurology referral), GERD (increase pepcid to BID and consider protonix)  Consult Visits: 04/18/19: [place] office visit w/ Dr. Angelena Form - Pt found not to be aspirin intolerant. Wished to D/C plavix and initiate aspirin. Agreeable to Dr. Angelena Form. Arranged ECHO for LV systolic function  11/06/99: Allergy office visit w/ Dr. Cammie Mcgee - Gradual oral rechallenge of aspirin. Rechallenge tolerated.   12/29,22/20: Physical Therapy w/ Janene Harvey  03/24/19: Nerve conduction studies w/ Dr. Posey Pronto - Chronic L4 radiculopathy affecting bilateral extremities, mild. No evidence of large fibe sensorimotor polyneuropathy affecting the lower extremities.  03/07/19: Neurology office visit w/ Dr. Posey Pronto - Gait unsteadiness. Exam shows mild atax and dysmetria likely stemming from past stroke. No evidence of neuropathy or neurodegenerative disease such as parkinson's. Referral to PT. RTC in 4 months  03/02/19: ENT office visit w/ Dr. Janace Hoard - Reflux causing hoarseness. Agreeable to dexilant in combination with plavix.  01/03/19: Allergy office visit w/ Dr. Cammie Mcgee - Allergy skin testing. Cetirizine 10mg  daily for runny nose or itching. Triamcinolone 0.1% as needed for itchy skin below face  Medications: Outpatient Encounter Medications as of 03/21/2020  Medication Sig Note  . albuterol (VENTOLIN HFA) 108 (90 Base) MCG/ACT inhaler Inhale 2  puffs into the lungs every 4 (four) hours as needed for wheezing or shortness of breath.   Marland Kitchen aspirin EC 81 MG tablet Take 1 tablet (81 mg total) by mouth daily.   Marland Kitchen azelastine (ASTELIN) 0.1 % nasal spray Place 2 sprays into both nostrils at bedtime as needed for rhinitis or allergies. (Patient taking differently: Place 2 sprays into both nostrils at bedtime. )   . azithromycin (ZITHROMAX Z-PAK) 250 MG tablet 2 tabs a Johndavid Geralds the first Maizy Davanzo, then 1 tab a Hattye Siegfried x 4 days   . cholecalciferol (VITAMIN D) 1000 units tablet Take 1,000 Units by mouth daily.   . fluticasone (FLONASE) 50 MCG/ACT nasal spray One spray each nostril twice a Nohely Whitehorn if needed for stuffy nose.   . fluticasone (FLOVENT HFA) 110 MCG/ACT inhaler Inhale 1 puff into the lungs in the morning and at bedtime. Rinse mouth after each use.   Marland Kitchen HYDROcodone-homatropine (HYCODAN) 5-1.5 MG/5ML syrup Take 5 mLs by mouth 3 (three) times daily as needed for cough.   . hydrocortisone 2.5 % cream Apply 1 application topically daily as needed Gwynn Burly).    Marland Kitchen ketoconazole (NIZORAL) 2 % shampoo Apply 1 application topically once a week.    . metoprolol tartrate (LOPRESSOR) 25 MG tablet Take 0.5 tablets (12.5 mg total) by mouth 2 (two) times daily.   . montelukast (SINGULAIR) 10 MG tablet Take 1 tablet (10 mg total) by mouth at bedtime.   . Multiple Vitamin (MULTIVITAMIN WITH MINERALS) TABS tablet Take 1 tablet by mouth daily.   . nitroGLYCERIN (NITROSTAT) 0.4 MG SL tablet Place 1 tablet (0.4 mg total) under the tongue every 5 (five) minutes x 3 doses as needed for chest pain. (Patient not taking: Reported on  02/09/2020) 02/09/2020: PRN  . olmesartan-hydrochlorothiazide (BENICAR HCT) 20-12.5 MG tablet Take 1 tablet by mouth daily.   . Omega-3 Fatty Acids (FISH OIL) 1200 MG CAPS Take 1,200 mg by mouth daily.   . pantoprazole (PROTONIX) 40 MG tablet Take 1 tablet (40 mg total) by mouth 2 (two) times daily.   . predniSONE (DELTASONE) 10 MG tablet 2 tabs a Bentleigh Stankus x 5 days    . rosuvastatin (CRESTOR) 40 MG tablet Take 1 tablet (40 mg total) by mouth every evening.   . tadalafil (CIALIS) 5 MG tablet Take 5 mg by mouth daily as needed.   . tamsulosin (FLOMAX) 0.4 MG CAPS capsule Take 1 capsule (0.4 mg total) by mouth daily.   Marland Kitchen triamcinolone cream (KENALOG) 0.1 % Apply 1 application topically daily as needed (Eczema).     No facility-administered encounter medications on file as of 03/21/2020.   SDOH Screenings   Alcohol Screen: Not on file  Depression (PHQ2-9): Low Risk   . PHQ-2 Score: 0  Financial Resource Strain: Low Risk   . Difficulty of Paying Living Expenses: Not very hard  Food Insecurity: Not on file  Housing: Not on file  Physical Activity: Not on file  Social Connections: Not on file  Stress: Not on file  Tobacco Use: Low Risk   . Smoking Tobacco Use: Never Smoker  . Smokeless Tobacco Use: Never Used  Transportation Needs: Not on file     Current Diagnosis/Assessment:  Goals Addressed            This Visit's Progress   . Chronic Care Management Pharmacy Care Plan       CARE PLAN ENTRY (see longitudinal plan of care for additional care plan information)  Current Barriers:  . Chronic Disease Management support, education, and care coordination needs related to Asthma, HTN, HLD, CAD, Pre-Diabetes, Allergic Rhinitis, Eczema, GERD, BPH   Hypertension BP Readings from Last 3 Encounters:  10/20/19 119/73  09/30/19 130/85  09/28/19 120/82   . Pharmacist Clinical Goal(s): o Over the next 180 days, patient will work with PharmD and providers to maintain BP goal <130/80 . Current regimen:   Metoprolol Tartrate 25mg  1/2 tab twice daily  Olmesartan-hctz 20-12.5mg  one daily . Patient self care activities - Over the next 180 days, patient will: o Check BP 1-2 times per week, document, and provide at future appointments o Ensure daily salt intake < 2300 mg/Tavon Corriher o Maintain hypertension medication regimen.   Hyperlipidemia/CAD Lab  Results  Component Value Date/Time   LDLCALC 42 04/12/2019 08:01 AM   LDLDIRECT 55.0 02/22/2018 10:18 AM   . Pharmacist Clinical Goal(s): o Over the next 180 days, patient will work with PharmD and providers to maintain LDL goal < 70 . Current regimen:   Rosuvastatin 40mg  once daily  Fish oil 1200mg  daily   Aspirin 81mg  daily . Patient self care activities - Over the next 180 days, patient will: o Maintain cholesterol medication regimen.   Pre-Diabetes Lab Results  Component Value Date/Time   HGBA1C 6.1 09/23/2019 02:05 PM   HGBA1C 6.1 04/12/2019 08:01 AM   HGBA1C 5.8 11/04/2018 12:00 AM   . Pharmacist Clinical Goal(s): o Over the next 180 days, patient will work with PharmD and providers to maintain A1c goal <6.5% . Current regimen:  o Diet and exercise management   . Patient self care activities - Over the next 180 days, patient will: o Maintain a1c <6.5%  Medication management . Pharmacist Clinical Goal(s): o Over the next 180 days,  patient will work with PharmD and providers to maintain optimal medication adherence . Current pharmacy: [facility] . Interventions o Comprehensive medication review performed. o Continue current medication management strategy . Patient self care activities - Over the next 180 days, patient will: o Focus on medication adherence by filling and taking medications appropriately  o Take medications as prescribed o Report any questions or concerns to PharmD and/or provider(s)  Please see past updates related to this goal by clicking on the "Past Updates" button in the selected goal       Married. 3 grown daughters and 5 grandkids. [place], [place], [place].    Uses pill box   Asthma   Last spirometry score: 04/11/19 FVC = 80% predicted (measured best 3.53) FEV1 = 90% predicted (measured best 2.91) FEV1/FVC = 0.82   Eosinophil count:   Lab Results  Component Value Date/Time   EOSPCT 1.3 09/23/2019 02:05 PM  %                               Eos  (Absolute):  Lab Results  Component Value Date/Time   EOSABS 0.1 09/23/2019 02:05 PM    Tobacco Status:  Social History   Tobacco Use  Smoking Status Never Smoker  Smokeless Tobacco Never Used    Patient has failed these meds in past: Qvar (unknown reason for D/C) Patient is currently controlled on the following medications:   Flovent 170mcg 1 puff BID  ProAir HFA 2 puffs Q6H PRN Using maintenance inhaler regularly? Yes Frequency of rescue inhaler use:  infrequently  We discussed: rinsing mouth out after use of Flovent to prevent thrush  Update 03/21/20 Not having issues with breathing  Plan -Continue current medications  Hypertension   Blood Pressure Goal <130/80  CMP  CMP Latest Ref Rng & Units 09/28/2019 09/23/2019 07/29/2019  Glucose 65 - 99 mg/dL 127(H) 99 126(H)  BUN 8 - 27 mg/dL 20 23 21   Creatinine 0.76 - 1.27 mg/dL 1.16 1.27 1.13  Sodium 134 - 144 mmol/L 140 138 138  Potassium 3.5 - 5.2 mmol/L 4.2 4.1 4.2  Chloride 96 - 106 mmol/L 105 104 103  CO2 20 - 29 mmol/L 21 27 25   Calcium 8.6 - 10.2 mg/dL 9.6 9.7 9.7  Total Protein 6.0 - 8.5 g/dL 6.5 - 6.7  Total Bilirubin 0.0 - 1.2 mg/dL 0.5 - 0.8  Alkaline Phos 48 - 121 IU/L 59 - 58  AST 0 - 40 IU/L 21 - 20  ALT 0 - 44 IU/L 21 - 21   Lab Results  Component Value Date   CREATININE 1.16 09/28/2019   BUN 20 09/28/2019   GFR 55.81 (L) 09/23/2019   GFRNONAA 63 09/28/2019   GFRAA 73 09/28/2019   NA 140 09/28/2019   K 4.2 09/28/2019   CALCIUM 9.6 09/28/2019   CO2 21 09/28/2019   Office blood pressures are  BP Readings from Last 3 Encounters:  10/20/19 119/73  09/30/19 130/85  09/28/19 120/82    Patient has failed these meds in the past: nifedipine (dermatitis, rash, edema), losartan (factory recalled; not failed)  Patient is currently controlled on the following medications:   Metoprolol Tartrate 25mg  1/2 tab twice daily  Olmesartan-hctz 20-12.5mg  one daily  Patient checks BP at home 1-2x per  week  Patient home BP readings are ranging: (Avg: 120/75)  Has a BP cuff. Will check BP if he feels lightheaded.   We discussed: Blood Pressure goals (<  140/90 also >90/60)  Update 03/21/20 Denies headaches  Plan -Check Blood pressure 1-2 times daily and record BP -Continue current medications   Hyperlipidemia/CAD   LDL goal <70  Followed by cardiologist Dr. Angelena Form  Lipid Panel     Component Value Date/Time   CHOL 113 04/12/2019 0801   TRIG 187.0 (H) 04/12/2019 0801   HDL 33.20 (L) 04/12/2019 0801   CHOLHDL 3 04/12/2019 0801   VLDL 37.4 04/12/2019 0801   LDLCALC 42 04/12/2019 0801   LDLDIRECT 55.0 02/22/2018 1018     ASCVD 10-year risk: Hx of ASCVD  Patient has failed these meds in past: simvastatin (in D/C list, but pt can't recall why D/C'd),Plavix (was told he had an allergy to aspirin and was placed on Plavix. Had aspirin allergy performed and he is not allergic to aspirin to pt switched to aspirin 81mg ) Patient is currently controlled for LDL but not HDL and TG on the following medications:  Rosuvastatin 40mg  once daily  Fish oil 1200mg  daily   Aspirin 81mg  daily  We discussed:  diet and exercise extensively   Update 03/21/20 Tolerating regimen  Plan -Continue current medications  Pre-Diabetes   A1c goal <6.5%  Recent Relevant Labs: Lab Results  Component Value Date/Time   HGBA1C 6.1 09/23/2019 02:05 PM   HGBA1C 6.1 04/12/2019 08:01 AM   HGBA1C 5.8 11/04/2018 12:00 AM   MICROALBUR 0.6 05/15/2006 07:47 AM     Patient is currently controlled, but trending up on the following medications: None  Last diabetic Eye exam:  Lab Results  Component Value Date/Time   HMDIABEYEEXA No Retinopathy 02/17/2017 12:00 AM    Eye Exam: 03/01/19 Last diabetic Foot exam: No results found for: HMDIABFOOTEX   We discussed: diet and exercise extensively   Update 03/21/20 A1c stable  Plan -Practice moderation with carbohydrates -Continue control with diet  and exercise   Miscellaneous Meds Ibuprofen 200mg  #3 daily (discussed NSAID use and affect on kidneys noting kidney function is normal as of now. He also knows to eat food before taking)  De Blanch, PharmD, BCACP Clinical Pharmacist [facility] [phone]

## 2020-03-26 ENCOUNTER — Other Ambulatory Visit: Payer: Self-pay

## 2020-03-26 ENCOUNTER — Ambulatory Visit (INDEPENDENT_AMBULATORY_CARE_PROVIDER_SITE_OTHER): Payer: PPO | Admitting: Internal Medicine

## 2020-03-26 ENCOUNTER — Encounter: Payer: Self-pay | Admitting: Internal Medicine

## 2020-03-26 VITALS — BP 111/70 | HR 59 | Temp 97.8°F | Ht 70.0 in | Wt 223.0 lb

## 2020-03-26 DIAGNOSIS — E785 Hyperlipidemia, unspecified: Secondary | ICD-10-CM | POA: Diagnosis not present

## 2020-03-26 DIAGNOSIS — E559 Vitamin D deficiency, unspecified: Secondary | ICD-10-CM

## 2020-03-26 DIAGNOSIS — I10 Essential (primary) hypertension: Secondary | ICD-10-CM | POA: Diagnosis not present

## 2020-03-26 DIAGNOSIS — Z0000 Encounter for general adult medical examination without abnormal findings: Secondary | ICD-10-CM | POA: Diagnosis not present

## 2020-03-26 DIAGNOSIS — R739 Hyperglycemia, unspecified: Secondary | ICD-10-CM | POA: Diagnosis not present

## 2020-03-26 LAB — LIPID PANEL
Cholesterol: 122 mg/dL (ref 0–200)
HDL: 35.1 mg/dL — ABNORMAL LOW (ref >39.00)
NonHDL: 87.3
Total CHOL/HDL Ratio: 3
Triglycerides: 267 mg/dL — ABNORMAL HIGH (ref 0.0–149.0)
VLDL: 53.4 mg/dL — ABNORMAL HIGH (ref 0.0–40.0)

## 2020-03-26 LAB — BASIC METABOLIC PANEL
BUN: 24 mg/dL — ABNORMAL HIGH (ref 6–23)
CO2: 29 mEq/L (ref 19–32)
Calcium: 9.4 mg/dL (ref 8.4–10.5)
Chloride: 104 mEq/L (ref 96–112)
Creatinine, Ser: 1.31 mg/dL (ref 0.40–1.50)
GFR: 54.55 mL/min — ABNORMAL LOW (ref >60.00)
Glucose, Bld: 120 mg/dL — ABNORMAL HIGH (ref 70–99)
Potassium: 4.5 mEq/L (ref 3.5–5.1)
Sodium: 139 mEq/L (ref 135–145)

## 2020-03-26 LAB — HEMOGLOBIN A1C: Hgb A1c MFr Bld: 6.2 % (ref 4.6–6.5)

## 2020-03-26 LAB — LDL CHOLESTEROL, DIRECT: Direct LDL: 60 mg/dL

## 2020-03-26 LAB — VITAMIN D 25 HYDROXY (VIT D DEFICIENCY, FRACTURES): VITD: 45.58 ng/mL (ref 30.00–100.00)

## 2020-03-26 NOTE — Patient Instructions (Addendum)
Check the  blood pressure regularly BP GOAL is between 110/65 and  135/85. If it is consistently higher or lower, let me know  Please call the gastroenterology office and set up colonoscopy  For earwax: Use peroxide 3 times a week.  If you are not able to get your ears cleaned let us know  GO TO THE LAB : Get the blood work     [place], [place] back for for a checkup in 6 months

## 2020-03-26 NOTE — Assessment & Plan Note (Signed)
-  Td 2019 - Pneumonia shot 2013;Prevnar 2015 - zostavax 2013 - s/p shingrex -Had COVID vaccines x3 - had a flu shot   -Prostate Ca screening:  Saw Dr. Jeffie Pollock last week , DRE done, PSA was 2.47  -Colonoscopy in 2010, 08-2014, due for C-scope.  Patient aware, plans to call .  -Labs:BMP, FLP, A1c, vitamin D.

## 2020-03-26 NOTE — Assessment & Plan Note (Signed)
Here for CPX Prediabetes: Has a healthy lifestyle, check A1c. HTN: On metoprolol, Benicar HCT, BP is excellent.  Check labs. High cholesterol: Check a lipid panel, no fasting, on Crestor. CAD: Last cath 09/30/2019: Rx medical management, asymptomatic. Vitamin D: History of mild deficiency, on MVI and vitamin D3 OTC.  Checking levels. Cerumen impaction: Recommend peroxide 3 times a week, OV if unable to clear the wax. RTC 6 months

## 2020-03-26 NOTE — Progress Notes (Signed)
Subjective:    Patient ID: George Alvarez, male    DOB: 1945-10-23, 74 y.o.   MRN: [id number]  DOS:  03/26/2020 Type of visit - description: CPX  Since the last office visit he is doing well. Has no major concerns except that he feels ears are full of wax.  Review of Systems  Other than above, a 14 point review of systems is negative      Past Medical History:  Diagnosis Date  . Anal fissure   . Arthritis   . Asthma, chronic 06/16/2007   Qualifier: Diagnosis of  By: Linna Darner MD, Gwyndolyn Saxon   Onset:as child Triggers (environmental, infectious, allergic): all, mainly environmental triggers Rescue inhaler YFV:CBSWHQ Maintenance medications/ response:Singulair,Qvar Smoking history:never Family history pulmonary disease: no    . Basal cell carcinoma of chest wall 2016   1.8 cm, treated with electrodessication and currettage  . Benign paroxysmal positional vertigo 11/19/2012   Diagnosed at [facility]  Physical therapy appointment pending   . BPH (benign prostatic hyperplasia)    With urinary obstruction  . Coronary artery disease    a. 02/2012 Cath/PCI: LM 10, LAD min irregs, LCX large, OM1 sm, 40 ost, OM2 95p (5.0x16 Veriflex & 4.5x12 Veriflex BMS'), RCA 30p, 62m, 30d, PDA/PLA min irregs, EF 65%  . Diverticulosis   . Erectile dysfunction due to arterial insufficiency   . Fundic gland polyps of stomach, benign 2010  . GERD (gastroesophageal reflux disease)   . History of elevated PSA   . History of kidney stones   . Hyperlipidemia   . Hypertension   . Nocturia   . Perennial allergic rhinitis   . Peyronie's disease   . Skin cancer    Basal and squamous cell cancers, greater than 20  . Stroke ([place]) 2016  . Tubular adenoma of colon 2010    Past Surgical History:  Procedure Laterality Date  . CORONARY ANGIOPLASTY WITH STENT PLACEMENT  02/06/2012   OM2  bare metal   . EPIDURAL BLOCK INJECTION  04-2015   Dr Brien Few  . epidural steroids  2007, 2009   X 2 @  cervical &, lumbar)  . HERNIA REPAIR    . INTRAVASCULAR ULTRASOUND  02/06/2012   Procedure: INTRAVASCULAR ULTRASOUND;  Surgeon: [name], MD;  Location: [facility] CATH LAB;  Service: Cardiovascular;;  . LEFT HEART CATH AND CORONARY ANGIOGRAPHY N/A 09/30/2019   Procedure: LEFT HEART CATH AND CORONARY ANGIOGRAPHY;  Surgeon: [name], MD;  Location: [facility] CV LAB;  Service: Cardiovascular;  Laterality: N/A;  . LEFT HEART CATHETERIZATION WITH CORONARY ANGIOGRAM N/A 02/06/2012   Procedure: LEFT HEART CATHETERIZATION WITH CORONARY ANGIOGRAM;  Surgeon: [name], MD;  Location: [facility] CATH LAB;  Service: Cardiovascular;  Laterality: N/A;  . LUMBAR LAMINECTOMY/DECOMPRESSION MICRODISCECTOMY N/A 10/21/2017   Procedure: Lumbar Two-Three, Lumbar Four-Five Discectomy;  Surgeon: Ashok Pall, MD;  Location: [place];  Service: Neurosurgery;  Laterality: N/A;  . PERCUTANEOUS CORONARY STENT INTERVENTION (PCI-S)  02/06/2012   Procedure: PERCUTANEOUS CORONARY STENT INTERVENTION (PCI-S);  Surgeon: [name], MD;  Location: [facility] CATH LAB;  Service: Cardiovascular;;  . septoplasty    . TONSILLECTOMY AND ADENOIDECTOMY    . TRIGGER FINGER RELEASE    . UPPER GASTROINTESTINAL ENDOSCOPY  2011   gastric polyps  . VASECTOMY      Allergies as of 03/26/2020      Reactions   Nifedipine Dermatitis, Rash   edema      Medication List  Accurate as of March 26, 2020  8:48 PM. If you have any questions, ask your nurse or doctor.        STOP taking these medications   azithromycin 250 MG tablet Commonly known as: Zithromax Z-Pak Stopped by: Kathlene November, MD   HYDROcodone-homatropine 5-1.5 MG/5ML syrup Commonly known as: HYCODAN Stopped by: Kathlene November, MD   predniSONE 10 MG tablet Commonly known as: DELTASONE Stopped by: Kathlene November, MD     TAKE these medications   albuterol 108 (90 Base) MCG/ACT inhaler Commonly known as: VENTOLIN HFA Inhale 2 puffs into the lungs  every 4 (four) hours as needed for wheezing or shortness of breath.   aspirin EC 81 MG tablet Take 1 tablet (81 mg total) by mouth daily.   azelastine 0.1 % nasal spray Commonly known as: ASTELIN Place 2 sprays into both nostrils at bedtime as needed for rhinitis or allergies. What changed: when to take this   cholecalciferol 1000 units tablet Commonly known as: VITAMIN D Take 1,000 Units by mouth daily.   Fish Oil 1200 MG Caps Take 1,200 mg by mouth daily.   fluticasone 110 MCG/ACT inhaler Commonly known as: FLOVENT HFA Inhale 1 puff into the lungs in the morning and at bedtime. Rinse mouth after each use.   fluticasone 50 MCG/ACT nasal spray Commonly known as: FLONASE One spray each nostril twice a day if needed for stuffy nose.   hydrocortisone 2.5 % cream Apply 1 application topically daily as needed (Eczemia).   ketoconazole 2 % shampoo Commonly known as: NIZORAL Apply 1 application topically once a week.   metoprolol tartrate 25 MG tablet Commonly known as: LOPRESSOR Take 0.5 tablets (12.5 mg total) by mouth 2 (two) times daily.   montelukast 10 MG tablet Commonly known as: SINGULAIR Take 1 tablet (10 mg total) by mouth at bedtime.   multivitamin with minerals Tabs tablet Take 1 tablet by mouth daily.   nitroGLYCERIN 0.4 MG SL tablet Commonly known as: NITROSTAT Place 1 tablet (0.4 mg total) under the tongue every 5 (five) minutes x 3 doses as needed for chest pain.   olmesartan-hydrochlorothiazide 20-12.5 MG tablet Commonly known as: BENICAR HCT Take 1 tablet by mouth daily.   pantoprazole 40 MG tablet Commonly known as: PROTONIX Take 1 tablet (40 mg total) by mouth 2 (two) times daily.   rosuvastatin 40 MG tablet Commonly known as: CRESTOR Take 1 tablet (40 mg total) by mouth every evening.   tadalafil 5 MG tablet Commonly known as: CIALIS Take 5 mg by mouth daily as needed.   tamsulosin 0.4 MG Caps capsule Commonly known as: FLOMAX Take 1  capsule (0.4 mg total) by mouth daily.   triamcinolone 0.1 % Commonly known as: KENALOG Apply 1 application topically daily as needed (Eczema).          Objective:   Physical Exam BP 111/70 (BP Location: Left Arm, Patient Position: Sitting, Cuff Size: Large)   Pulse (!) 59   Temp 97.8 F (36.6 C) (Oral)   Ht 5\' 10"  (1.778 m)   Wt 223 lb (101.2 kg)   SpO2 98%   BMI 32.00 kg/m  General: Well developed, NAD, BMI noted Neck: No  thyromegaly  HEENT:  Normocephalic . Face symmetric, atraumatic. Right ear: Cerumen impaction, able to remove some wax with a spoon. Left ear: Mild amount of wax Lungs:  CTA B Normal respiratory effort, no intercostal retractions, no accessory muscle use. Heart: RRR,  no murmur.  Abdomen:  Not distended,  soft, non-tender. No rebound or rigidity.   Lower extremities: no pretibial edema bilaterally  Skin: Exposed areas without rash. Not pale. Not jaundice Neurologic:  alert & oriented X3.  Speech normal, gait appropriate for age and unassisted Strength symmetric and appropriate for age.  Psych: Cognition and judgment appear intact.  Cooperative with normal attention span and concentration.  Behavior appropriate. No anxious or depressed appearing.     Assessment      Assessment  Prediabetes HTN Hyperlipidemia CV: Dr Angelena Form --CAD --Stroke, seen in a MRI Asthma- Dr Darcey Nora  MSK --DJD knees used to see Dr Len Childs 2016, local injections --Spinal stenosis (neck-back) - Dr Cyndy Freeze dx ~ 2008 via MRI neck, back, had local injections 2008; then 04-2015 GERD BPV, off balance (seen by neuro 2020)  GU: --Elevated PSA -- Dr Jeffie Pollock --Peyronie's  Disease Skin cancer : [facility] Dr Susie Cassette   PLAN: Here for CPX Prediabetes: Has a healthy lifestyle, check A1c. HTN: On metoprolol, Benicar HCT, BP is excellent.  Check labs. High cholesterol: Check a lipid panel, no fasting, on Crestor. CAD: Last cath 09/30/2019: Rx medical management,  asymptomatic. Vitamin D: History of mild deficiency, on MVI and vitamin D3 OTC.  Checking levels. Cerumen impaction: Recommend peroxide 3 times a week, OV if unable to clear the wax. RTC 6 months   This visit occurred during the SARS-CoV-2 public health emergency.  Safety protocols were in place, including screening questions prior to the visit, additional usage of staff PPE, and extensive cleaning of exam room while observing appropriate contact time as indicated for disinfecting solutions.

## 2020-04-02 ENCOUNTER — Encounter: Payer: Self-pay | Admitting: Internal Medicine

## 2020-04-24 DIAGNOSIS — Z20822 Contact with and (suspected) exposure to covid-19: Secondary | ICD-10-CM | POA: Diagnosis not present

## 2020-05-04 ENCOUNTER — Other Ambulatory Visit: Payer: Self-pay | Admitting: Internal Medicine

## 2020-05-07 DIAGNOSIS — L218 Other seborrheic dermatitis: Secondary | ICD-10-CM | POA: Diagnosis not present

## 2020-05-07 DIAGNOSIS — D234 Other benign neoplasm of skin of scalp and neck: Secondary | ICD-10-CM | POA: Diagnosis not present

## 2020-05-07 DIAGNOSIS — L820 Inflamed seborrheic keratosis: Secondary | ICD-10-CM | POA: Diagnosis not present

## 2020-05-07 DIAGNOSIS — D485 Neoplasm of uncertain behavior of skin: Secondary | ICD-10-CM | POA: Diagnosis not present

## 2020-05-13 ENCOUNTER — Other Ambulatory Visit: Payer: Self-pay | Admitting: Allergy

## 2020-05-16 ENCOUNTER — Ambulatory Visit (AMBULATORY_SURGERY_CENTER): Payer: Self-pay

## 2020-05-16 ENCOUNTER — Other Ambulatory Visit: Payer: Self-pay | Admitting: Gastroenterology

## 2020-05-16 ENCOUNTER — Other Ambulatory Visit: Payer: Self-pay

## 2020-05-16 VITALS — Ht 70.0 in | Wt 227.0 lb

## 2020-05-16 DIAGNOSIS — Z86010 Personal history of colonic polyps: Secondary | ICD-10-CM

## 2020-05-16 MED ORDER — PLENVU 140 G PO SOLR: 1.0000 | ORAL | 0 refills | Status: DC

## 2020-05-16 NOTE — Progress Notes (Signed)
No allergies to soy or egg Pt is not on blood thinners or diet pills Denies issues with sedation/intubation Denies atrial flutter/fib Denies constipation   Emmi instructions given to pt  Pt is aware of Covid safety and care partner requirements.  

## 2020-05-24 ENCOUNTER — Ambulatory Visit: Payer: PPO | Admitting: Allergy and Immunology

## 2020-05-24 ENCOUNTER — Ambulatory Visit: Payer: PPO | Admitting: Allergy & Immunology

## 2020-05-28 ENCOUNTER — Encounter: Payer: Self-pay | Admitting: Gastroenterology

## 2020-05-30 ENCOUNTER — Ambulatory Visit (AMBULATORY_SURGERY_CENTER): Payer: PPO | Admitting: Gastroenterology

## 2020-05-30 ENCOUNTER — Encounter: Payer: Self-pay | Admitting: Gastroenterology

## 2020-05-30 ENCOUNTER — Other Ambulatory Visit: Payer: Self-pay

## 2020-05-30 VITALS — BP 118/73 | HR 58 | Temp 97.1°F | Resp 15 | Ht 70.0 in | Wt 227.0 lb

## 2020-05-30 DIAGNOSIS — D125 Benign neoplasm of sigmoid colon: Secondary | ICD-10-CM

## 2020-05-30 DIAGNOSIS — D122 Benign neoplasm of ascending colon: Secondary | ICD-10-CM

## 2020-05-30 DIAGNOSIS — Z86010 Personal history of colonic polyps: Secondary | ICD-10-CM

## 2020-05-30 DIAGNOSIS — D123 Benign neoplasm of transverse colon: Secondary | ICD-10-CM | POA: Diagnosis not present

## 2020-05-30 DIAGNOSIS — Z1211 Encounter for screening for malignant neoplasm of colon: Secondary | ICD-10-CM | POA: Diagnosis not present

## 2020-05-30 DIAGNOSIS — D120 Benign neoplasm of cecum: Secondary | ICD-10-CM | POA: Diagnosis not present

## 2020-05-30 MED ORDER — SODIUM CHLORIDE 0.9 % IV SOLN: 500.0000 mL | Freq: Once | INTRAVENOUS | Status: DC

## 2020-05-30 NOTE — Progress Notes (Signed)
Called to room to assist during endoscopic procedure.  Patient ID and intended procedure confirmed with present staff. Received instructions for my participation in the procedure from the performing physician.  

## 2020-05-30 NOTE — Patient Instructions (Signed)
Please read handouts provided. High Fiber Diet. Continue present medications. Await pathology results.      YOU HAD AN ENDOSCOPIC PROCEDURE TODAY AT [facility]:   Refer to the procedure report that was given to you for any specific questions about what was found during the examination.  If the procedure report does not answer your questions, please call your gastroenterologist to clarify.  If you requested that your care partner not be given the details of your procedure findings, then the procedure report has been included in a sealed envelope for you to review at your convenience later.  YOU SHOULD EXPECT: Some feelings of bloating in the abdomen. Passage of more gas than usual.  Walking can help get rid of the air that was put into your GI tract during the procedure and reduce the bloating. If you had a lower endoscopy (such as a colonoscopy or flexible sigmoidoscopy) you may notice spotting of blood in your stool or on the toilet paper. If you underwent a bowel prep for your procedure, you may not have a normal bowel movement for a few days.  Please Note:  You might notice some irritation and congestion in your nose or some drainage.  This is from the oxygen used during your procedure.  There is no need for concern and it should clear up in a day or so.  SYMPTOMS TO REPORT IMMEDIATELY:   Following lower endoscopy (colonoscopy or flexible sigmoidoscopy):  Excessive amounts of blood in the stool  Significant tenderness or worsening of abdominal pains  Swelling of the abdomen that is new, acute  Fever of 100F or higher  For urgent or emergent issues, a gastroenterologist can be reached at any hour by calling [phone]. Do not use MyChart messaging for urgent concerns.    DIET:  We do recommend a small meal at first, but then you may proceed to your regular diet.  Drink plenty of fluids but you should avoid alcoholic beverages for 24 hours.  ACTIVITY:  You should  plan to take it easy for the rest of today and you should NOT DRIVE or use heavy machinery until tomorrow (because of the sedation medicines used during the test).    FOLLOW UP: Our staff will call the number listed on your records 48-72 hours following your procedure to check on you and address any questions or concerns that you may have regarding the information given to you following your procedure. If we do not reach you, we will leave a message.  We will attempt to reach you two times.  During this call, we will ask if you have developed any symptoms of COVID 19. If you develop any symptoms (ie: fever, flu-like symptoms, shortness of breath, cough etc.) before then, please call [phone].  If you test positive for Covid 19 in the 2 weeks post procedure, please call and report this information to [name].    If any biopsies were taken you will be contacted by phone or by letter within the next 1-3 weeks.  Please call us at [phone] if you have not heard about the biopsies in 3 weeks.    SIGNATURES/CONFIDENTIALITY: You and/or your care partner have signed paperwork which will be entered into your electronic medical record.  These signatures attest to the fact that that the information above on your After Visit Summary has been reviewed and is understood.  Full responsibility of the confidentiality of this discharge information lies with you and/or your care-partner.

## 2020-05-30 NOTE — Progress Notes (Signed)
To PACU, VSS. Report to [email or web address] 

## 2020-05-30 NOTE — Op Note (Signed)
[facility] Patient Name: George Alvarez Procedure Date: 05/30/2020 1:34 PM MRN: [id number] Endoscopist: Ladene Artist , MD Age: 75 Referring MD:  Date of Birth: 09/06/1945 Gender: Male Account #: [id number] Procedure:                Colonoscopy Indications:              Surveillance: Personal history of adenomatous                            polyps on last colonoscopy 5 years ago Medicines:                Monitored Anesthesia Care Procedure:                Pre-Anesthesia Assessment:                           - Prior to the procedure, a History and Physical                            was performed, and patient medications and                            allergies were reviewed. The patient's tolerance of                            previous anesthesia was also reviewed. The risks                            and benefits of the procedure and the sedation                            options and risks were discussed with the patient.                            All questions were answered, and informed consent                            was obtained. Prior Anticoagulants: The patient has                            taken no previous anticoagulant or antiplatelet                            agents. ASA Grade Assessment: II - A patient with                            mild systemic disease. After reviewing the risks                            and benefits, the patient was deemed in                            satisfactory condition to undergo the procedure.  After obtaining informed consent, the colonoscope                            was passed under direct vision. Throughout the                            procedure, the patient's blood pressure, pulse, and                            oxygen saturations were monitored continuously. The                            Olympus CF-HQ190 [id number]) Colonoscope was                            introduced through the anus  and advanced to the the                            cecum, identified by appendiceal orifice and                            ileocecal valve. The ileocecal valve, appendiceal                            orifice, and rectum were photographed. The quality                            of the bowel preparation was adequate. The                            colonoscopy was performed without difficulty. The                            patient tolerated the procedure well. Scope In: 1:39:02 PM Scope Out: 2:00:03 PM Scope Withdrawal Time: 0 hours 17 minutes 44 seconds  Total Procedure Duration: 0 hours 21 minutes 1 second  Findings:                 The perianal and digital rectal examinations were                            normal.                           Seven sessile polyps were found in the sigmoid                            colon (2), transverse colon (3), ascending colon                            (1) and cecum (1). The polyps were 3 to 8 mm in                            size. These polyps were removed with a cold snare.  Resection and retrieval were complete.                           Multiple small-mouthed diverticula were found in                            the left colon. There was no evidence of                            diverticular bleeding.                           Internal hemorrhoids were found during                            retroflexion. The hemorrhoids were small and Grade                            I (internal hemorrhoids that do not prolapse).                           The exam was otherwise without abnormality on                            direct and retroflexion views. Complications:            No immediate complications. Estimated blood loss:                            None. Estimated Blood Loss:     Estimated blood loss: none. Impression:               - Seven 3 to 8 mm polyps in the sigmoid colon, in                            the transverse  colon, in the ascending colon and in                            the cecum, removed with a cold snare. Resected and                            retrieved.                           - Moderate diverticulosis in the left colon.                           - Internal hemorrhoids.                           - The examination was otherwise normal on direct                            and retroflexion views. Recommendation:           - Repeat colonoscopy, likely in 3 years, after  studies are complete for surveillance based on                            pathology results.                           - Patient has a contact number available for                            emergencies. The signs and symptoms of potential                            delayed complications were discussed with the                            patient. Return to normal activities tomorrow.                            Written discharge instructions were provided to the                            patient.                           - High fiber diet.                           - Continue present medications.                           - Await pathology results. Ladene Artist, MD 05/30/2020 [phone] PM This report has been signed electronically.

## 2020-06-01 ENCOUNTER — Telehealth: Payer: Self-pay

## 2020-06-01 NOTE — Telephone Encounter (Signed)
  Follow up Call-  Call back number 05/30/2020  Post procedure Call Back phone  # [phone]  Permission to leave phone message Yes  Some recent data might be hidden     Patient questions:  Do you have a fever, pain , or abdominal swelling? No. Pain Score  0 *  Have you tolerated food without any problems? Yes.    Have you been able to return to your normal activities? Yes.    Do you have any questions about your discharge instructions: Diet   No. Medications  No. Follow up visit  No.  Do you have questions or concerns about your Care? No.  Actions: * If pain score is 4 or above: No action needed, pain <4.  1. Have you developed a fever since your procedure? no  2.   Have you had an respiratory symptoms (SOB or cough) since your procedure? no  3.   Have you tested positive for COVID 19 since your procedure no  4.   Have you had any family members/close contacts diagnosed with the COVID 19 since your procedure?  no   If yes to any of these questions please route to Joylene John, RN and Joella Prince, RN

## 2020-06-03 ENCOUNTER — Other Ambulatory Visit: Payer: Self-pay | Admitting: Internal Medicine

## 2020-06-05 HISTORY — PX: COLONOSCOPY: SHX174

## 2020-06-07 ENCOUNTER — Ambulatory Visit: Payer: PPO | Admitting: Allergy & Immunology

## 2020-06-07 ENCOUNTER — Other Ambulatory Visit: Payer: Self-pay

## 2020-06-07 ENCOUNTER — Encounter: Payer: Self-pay | Admitting: Allergy & Immunology

## 2020-06-07 VITALS — BP 118/60 | HR 65 | Temp 99.0°F | Resp 16

## 2020-06-07 DIAGNOSIS — J4540 Moderate persistent asthma, uncomplicated: Secondary | ICD-10-CM | POA: Diagnosis not present

## 2020-06-07 DIAGNOSIS — K219 Gastro-esophageal reflux disease without esophagitis: Secondary | ICD-10-CM

## 2020-06-07 DIAGNOSIS — J3089 Other allergic rhinitis: Secondary | ICD-10-CM

## 2020-06-07 DIAGNOSIS — L2089 Other atopic dermatitis: Secondary | ICD-10-CM

## 2020-06-07 MED ORDER — FLUTICASONE PROPIONATE HFA 110 MCG/ACT IN AERO: 1.0000 | INHALATION_SPRAY | Freq: Two times a day (BID) | RESPIRATORY_TRACT | 5 refills | Status: DC

## 2020-06-07 MED ORDER — ALBUTEROL SULFATE HFA 108 (90 BASE) MCG/ACT IN AERS: 2.0000 | INHALATION_SPRAY | RESPIRATORY_TRACT | 1 refills | Status: DC | PRN

## 2020-06-07 NOTE — Patient Instructions (Addendum)
1. Moderate persistent asthma without complication - Lung testing deferred today. - We will continue with Flovent 13mcg one puff twice daily.  - We will continue with albuterol as needed. - Call us with any issues and we can make some prednisone happen over the phone.   2. Allergic rhinitis (dust mites) - Everything seems controlled with the environmental triggers. - Continue with the cetirizine 10mg  and montelukast 10mg  daily. - Use your nose sprays as needed as you are doing.   3.Atopic dermatitis - Continue with moisturizing as needed.   4. Gastroesophageal reflux disease - Continue with Protonix daily.  5. Return in about 6 months (around 12/08/2020).    Please inform us of any Emergency Department visits, hospitalizations, or changes in symptoms. Call us before going to the ED for breathing or allergy symptoms since we might be able to fit you in for a sick visit. Feel free to contact us anytime with any questions, problems, or concerns.  It was a pleasure to meet you today!  Websites that have reliable patient information: 1. American Academy of Asthma, Allergy, and Immunology: www.aaaai.org 2. Food Allergy Research and Education (FARE): foodallergy.org 3. Mothers of Asthmatics: http://www.asthmacommunitynetwork.org 4. American College of Allergy, Asthma, and Immunology: www.acaai.org   COVID-19 Vaccine Information can be found at: ShippingScam.co.uk For questions related to vaccine distribution or appointments, please email [email or web address] or call [phone].   We realize that you might be concerned about having an allergic reaction to the COVID19 vaccines. To help with that concern, WE ARE OFFERING THE COVID19 VACCINES IN OUR OFFICE! Ask the front desk for dates!     "Like" [name] on Facebook and Instagram for our latest updates!      A healthy democracy works best when [employer] participate! Make sure  you are registered to vote! If you have moved or changed any of your contact information, you will need to get this updated before voting!  In some cases, you MAY be able to register to vote online: [email or web address]

## 2020-06-07 NOTE — Progress Notes (Signed)
FOLLOW UP  Date of Service/Encounter:  06/07/20   Assessment:   Moderate persistent asthma, uncomplicated  Allergic rhinitis (dust mites)  Atopic dermatitis  GERD   Plan/Recommendations:   1. Moderate persistent asthma without complication - Lung testing deferred today. - We will continue with Flovent 149mcg one puff twice daily.  - We will continue with albuterol as needed. - Call us with any issues and we can make some prednisone happen over the phone.   2. Allergic rhinitis (dust mites) - Everything seems controlled with the environmental triggers. - Continue with the cetirizine 10mg  and montelukast 10mg  daily. - Use your nose sprays as needed as you are doing.   3.Atopic dermatitis - Continue with moisturizing as needed.   4. Gastroesophageal reflux disease - Continue with Protonix daily.  5. Return in about 6 months (around 12/08/2020).   Subjective:   George Alvarez is a 75 y.o. male presenting today for follow up of  Chief Complaint  Patient presents with  . Asthma    George Alvarez has a history of the following: Patient Active Problem List   Diagnosis Date Noted  . Other atopic dermatitis 11/22/2019  . Chest pain of uncertain etiology   . Viral upper respiratory tract infection with cough 07/06/2017  . Current use of beta blocker 08/09/2015  . Moderate persistent asthma without complication 12/45/8099  . Other allergic rhinitis 02/20/2015  . PCP NOTES >>> 01/11/2015  . Cerebellar infarct ([place]) 06/26/2014  . Annual physical exam 02/20/2014  . Hyperglycemia 02/05/2013  . Benign paroxysmal positional vertigo 11/19/2012  . CAD (coronary artery disease) 02/07/2012  . Gastroesophageal reflux disease without esophagitis 01/01/2009  . SKIN CANCER, HX OF 12/28/2007  . Hyperlipidemia 06/16/2007  . Essential hypertension 06/16/2007  . Asthma, chronic 06/16/2007  . Spinal stenosis 03/01/2007  . NEPHROLITHIASIS, HX OF 05/14/2006    History  obtained from: chart review and patient.  George Alvarez is a 75 y.o. male presenting for a follow up visit.  He was last seen in August 2021.  At that time, he was continued on Flovent 110 mcg 1 puff twice daily as well as montelukast 10 mg daily.  For his allergic rhinitis, he was continued on Zyrtec as well as Flonase and Astelin as needed.  Atopic dermatitis was under good control.  Reflux was also well controlled on Protonix daily.  Since the last visit he has done well.   Asthma/Respiratory Symptom History: He was previously on prednisone three times per year. This was typically in July and November at least, often more. Right now he is on Flovent one puff twice daily. He has actually stopped using it since he was out of it completely. He does increase his ICS dosing when he gets sick. He has been very stable with the use of the masks and whatnot. ACT is 25 today indicating excellent asthma control.   Allergic Rhinitis Symptom History: He does have the cetirizine that he takes every day. He has the nose sprays to use on a PRN basis. He has done very well on this regimen. He has not used any antibiotics for any sinus infections in several years at this point.  He is retired.  He previously worked as an Designer, fashion/clothing.  He spends a lot of time traveling now.  He has grandkids in [place] as well as [place] in [place].  He also has his mother who lives in [place].  He is fully vaccinated against  COVID-19 and has had no booster.  Otherwise, there have been no changes to his past medical history, surgical history, family history, or social history.    Review of Systems  Constitutional: Negative.  Negative for fever, malaise/fatigue and weight loss.  HENT: Negative.  Negative for congestion, ear discharge and ear pain.   Eyes: Negative for pain, discharge and redness.  Respiratory: Negative for cough, sputum production, shortness of breath and wheezing.    Cardiovascular: Negative.  Negative for chest pain and palpitations.  Gastrointestinal: Negative for abdominal pain and heartburn.  Skin: Negative.  Negative for itching and rash.  Neurological: Negative for dizziness and headaches.  Endo/Heme/Allergies: Negative for environmental allergies. Does not bruise/bleed easily.       Objective:   Blood pressure 118/60, pulse 65, temperature 99 F (37.2 C), temperature source Temporal, resp. rate 16, SpO2 95 %. There is no height or weight on file to calculate BMI.   Physical Exam:  Physical Exam Constitutional:      Appearance: He is well-developed.     Comments: Very pleasant talkative male.  HENT:     Head: Normocephalic and atraumatic.     Right Ear: Tympanic membrane, ear canal and external ear normal.     Left Ear: Tympanic membrane, ear canal and external ear normal.     Nose: No nasal deformity, septal deviation, mucosal edema, rhinorrhea or epistaxis.     Right Turbinates: Enlarged and swollen.     Left Turbinates: Enlarged and swollen.     Right Sinus: No maxillary sinus tenderness or frontal sinus tenderness.     Left Sinus: No maxillary sinus tenderness or frontal sinus tenderness.     Mouth/Throat:     Lips: Pink.     Mouth: Oropharynx is clear and moist. Mucous membranes are moist. Mucous membranes are not pale and not dry.     Pharynx: Uvula midline.     Comments: There is some cobblestoning in the posterior oropharynx.  Eyes:     General:        Right eye: No discharge.        Left eye: No discharge.     Extraocular Movements: EOM normal.     Conjunctiva/sclera: Conjunctivae normal.     Right eye: Right conjunctiva is not injected. No chemosis.    Left eye: Left conjunctiva is not injected. No chemosis.    Pupils: Pupils are equal, round, and reactive to light.  Cardiovascular:     Rate and Rhythm: Normal rate and regular rhythm.     Heart sounds: Normal heart sounds.  Pulmonary:     Effort: Pulmonary effort  is normal. No tachypnea, accessory muscle usage or respiratory distress.     Breath sounds: Normal breath sounds. No wheezing, rhonchi or rales.     Comments: Moving air well in all lung fields.  Chest:     Chest wall: No tenderness.  Lymphadenopathy:     Cervical: No cervical adenopathy.  Skin:    Coloration: Skin is not pale.     Findings: No abrasion, erythema, petechiae or rash. Rash is not papular, urticarial or vesicular.  Neurological:     Mental Status: He is alert.  Psychiatric:        Mood and Affect: Mood and affect normal.      Diagnostic studies: none     Salvatore Marvel, MD  [facility]

## 2020-06-13 ENCOUNTER — Encounter: Payer: Self-pay | Admitting: Gastroenterology

## 2020-07-03 NOTE — Progress Notes (Signed)
Chief Complaint  Patient presents with  . Follow-up    CAD   History of Present Illness: 75 yo male with history of HTN, HLD, PVCs and CAD who is here today for follow up. Cardiac cath on 02/06/12 with 95% proximal OM2 lesion and a 60% mid RCA lesion. EF was 65%. His OM2 was very large in caliber and was treated with bare-metal stents x 2. Medical management of RCA stenosis which was felt to be moderate. He has chronic dizziness and is felt to have vertigo. Normal stress myoview December 2015. He has been limited by chronic back pain due to spinal stenosis but following back surgery in July 2019 his pain improved. He been noted to be ASA  Intolerant in the past but recently had ASA testing in primary care and tolerated it well. Plavix was stopped in January 2021 and ASA was started. Echo January 2021 with LVEF=60-65%. No significant valve disease. Possible aortic root dilatation. Follow up chest CTA with no evidence of dilation of the ascending aorta. He was seen in our office June 2021 by Truitt Merle, NP and reported dyspnea and chest pain in the setting of an upper respiratory infection. Cardiac cath June 2021 with patent obtuse marginal stents, mild LAD disease and moderate RCA disease which was unchanged and not felt to be flow limiting. Normal LV systolic function  He is here today for follow up. The patient denies any chest pain, dyspnea, palpitations, lower extremity edema, orthopnea, PND, dizziness, near syncope or syncope.    Primary Care Physician: Colon Branch, MD  Past Medical History:  Diagnosis Date  . Allergy    seasonal  . Anal fissure   . Arthritis   . Asthma, chronic 06/16/2007   Qualifier: Diagnosis of  By: Linna Darner MD, Gwyndolyn Saxon   Onset:as child Triggers (environmental, infectious, allergic): all, mainly environmental triggers Rescue inhaler UXL:KGMWNU Maintenance medications/ response:Singulair,Qvar Smoking history:never Family history pulmonary disease: no    . Basal cell  carcinoma of chest wall 2016   1.8 cm, treated with electrodessication and currettage  . Benign paroxysmal positional vertigo 11/19/2012   Diagnosed at [facility], [facility]  Physical therapy appointment pending   . BPH (benign prostatic hyperplasia)    With urinary obstruction  . Coronary artery disease    a. 02/2012 Cath/PCI: LM 10, LAD min irregs, LCX large, OM1 sm, 40 ost, OM2 95p (5.0x16 Veriflex & 4.5x12 Veriflex BMS'), RCA 30p, 57m, 30d, PDA/PLA min irregs, EF 65%  . Diverticulosis   . Erectile dysfunction due to arterial insufficiency   . Fundic gland polyps of stomach, benign 2010  . GERD (gastroesophageal reflux disease)   . History of elevated PSA   . History of kidney stones   . Hyperlipidemia   . Hypertension   . Nocturia   . Perennial allergic rhinitis   . Peyronie's disease   . Skin cancer    Basal and squamous cell cancers, greater than 20  . Stroke ([place]) 2016  . Tubular adenoma of colon 2010    Past Surgical History:  Procedure Laterality Date  . COLONOSCOPY  2016  . COLONOSCOPY  06/2020  . CORONARY ANGIOPLASTY WITH STENT PLACEMENT  02/06/2012   OM2  bare metal   . EPIDURAL BLOCK INJECTION  04-2015   Dr Brien Few  . epidural steroids  2007, 2009   X 2 @ cervical &, lumbar)  . HERNIA REPAIR    . INTRAVASCULAR ULTRASOUND  02/06/2012   Procedure: INTRAVASCULAR ULTRASOUND;  Surgeon: [name], MD;  Location: [facility] CATH LAB;  Service: Cardiovascular;;  . LEFT HEART CATH AND CORONARY ANGIOGRAPHY N/A 09/30/2019   Procedure: LEFT HEART CATH AND CORONARY ANGIOGRAPHY;  Surgeon: [name], MD;  Location: [place] CV LAB;  Service: Cardiovascular;  Laterality: N/A;  . LEFT HEART CATHETERIZATION WITH CORONARY ANGIOGRAM N/A 02/06/2012   Procedure: LEFT HEART CATHETERIZATION WITH CORONARY ANGIOGRAM;  Surgeon: [name], MD;  Location: [facility] CATH LAB;  Service: Cardiovascular;  Laterality: N/A;  . LUMBAR LAMINECTOMY/DECOMPRESSION  MICRODISCECTOMY N/A 10/21/2017   Procedure: Lumbar Two-Three, Lumbar Four-Five Discectomy;  Surgeon: Ashok Pall, MD;  Location: [place];  Service: Neurosurgery;  Laterality: N/A;  . PERCUTANEOUS CORONARY STENT INTERVENTION (PCI-S)  02/06/2012   Procedure: PERCUTANEOUS CORONARY STENT INTERVENTION (PCI-S);  Surgeon: [name], MD;  Location: [facility] CATH LAB;  Service: Cardiovascular;;  . septoplasty    . TONSILLECTOMY AND ADENOIDECTOMY    . TRIGGER FINGER RELEASE    . UPPER GASTROINTESTINAL ENDOSCOPY  2011   gastric polyps  . VASECTOMY      Current Outpatient Medications  Medication Sig Dispense Refill  . albuterol (VENTOLIN HFA) 108 (90 Base) MCG/ACT inhaler Inhale 2 puffs into the lungs every 4 (four) hours as needed for wheezing or shortness of breath. 18 g 1  . aspirin EC 81 MG tablet Take 1 tablet (81 mg total) by mouth daily. 90 tablet 3  . azelastine (ASTELIN) 0.1 % nasal spray Place 2 sprays into both nostrils at bedtime as needed for rhinitis or allergies. 30 mL 5  . cholecalciferol (VITAMIN D) 1000 units tablet Take 1,000 Units by mouth daily.    . fluticasone (FLONASE) 50 MCG/ACT nasal spray One spray each nostril twice a day if needed for stuffy nose. 16 g 5  . fluticasone (FLOVENT HFA) 110 MCG/ACT inhaler Inhale 1 puff into the lungs in the morning and at bedtime. Rinse mouth after each use. 1 each 5  . hydrocortisone 2.5 % cream Apply 1 application topically daily as needed Gwynn Burly).     Marland Kitchen ketoconazole (NIZORAL) 2 % shampoo Apply 1 application topically once a week.    . metoprolol tartrate (LOPRESSOR) 25 MG tablet Take 0.5 tablets (12.5 mg total) by mouth 2 (two) times daily. 90 tablet 1  . montelukast (SINGULAIR) 10 MG tablet TAKE 1 TABLET BY MOUTH EVERYDAY AT BEDTIME 90 tablet 1  . Multiple Vitamin (MULTIVITAMIN WITH MINERALS) TABS tablet Take 1 tablet by mouth daily.    . nitroGLYCERIN (NITROSTAT) 0.4 MG SL tablet Place 1 tablet (0.4 mg total) under the tongue every  5 (five) minutes x 3 doses as needed for chest pain. 25 tablet 2  . olmesartan-hydrochlorothiazide (BENICAR HCT) 20-12.5 MG tablet Take 1 tablet by mouth daily. 90 tablet 1  . Omega-3 Fatty Acids (FISH OIL) 1200 MG CAPS Take 1,200 mg by mouth daily.    . pantoprazole (PROTONIX) 40 MG tablet Take 1 tablet (40 mg total) by mouth 2 (two) times daily. 180 tablet 3  . rosuvastatin (CRESTOR) 40 MG tablet Take 1 tablet (40 mg total) by mouth daily. 90 tablet 1  . tadalafil (CIALIS) 5 MG tablet Take 5 mg by mouth daily as needed.    . tamsulosin (FLOMAX) 0.4 MG CAPS capsule Take 1 capsule (0.4 mg total) by mouth daily. 15 capsule 0  . triamcinolone cream (KENALOG) 0.1 % Apply 1 application topically daily as needed (Eczema).      No current facility-administered medications for this visit.  Allergies  Allergen Reactions  . Nifedipine Dermatitis and Rash    edema    Social History   Socioeconomic History  . Marital status: Married    Spouse name: Not on file  . Number of children: 3  . Years of education: Not on file  . Highest education level: Not on file  Occupational History  . Occupation: Higher education careers adviser   Tobacco Use  . Smoking status: Never Smoker  . Smokeless tobacco: Never Used  Vaping Use  . Vaping Use: Never used  Substance and Sexual Activity  . Alcohol use: Yes    Alcohol/week: 0.0 standard drinks    Comment: Wine occasionally  . Drug use: No  . Sexual activity: Yes    Partners: Female  Other Topics Concern  . Not on file  Social History Narrative   3 daughters, 55 g-children   lifes w/ wife in [place]   Right handed   Two story home   Social Determinants of Health   Financial Resource Strain: Low Risk   . Difficulty of Paying Living Expenses: Not very hard  Food Insecurity: Not on file  Transportation Needs: Not on file  Physical Activity: Not on file  Stress: Not on file  Social Connections: Not on file  Intimate Partner Violence: Not on  file    Family History  Problem Relation Age of Onset  . Heart attack Father 2       died @ 39  . Heart disease Mother   . Breast cancer Mother   . Esophageal cancer Mother        Barrett's  . Heart attack Maternal Grandmother        after 65  . Dementia Maternal Grandmother        CVA  . Breast cancer Maternal Grandmother   . Diabetes Maternal Grandmother   . Stroke Maternal Grandmother        in 95s  . Heart attack Paternal Uncle 72  . Breast cancer Maternal Aunt   . Colon cancer Neg Hx   . Prostate cancer Neg Hx   . Colon polyps Neg Hx   . Rectal cancer Neg Hx   . Stomach cancer Neg Hx     Review of Systems:  As stated in the HPI and otherwise negative.   BP 108/70   Pulse (!) 57   Ht 5\' 10"  (1.778 m)   Wt 221 lb 9.6 oz (100.5 kg)   SpO2 98%   BMI 31.80 kg/m   Physical Examination:  General: Well developed, well nourished, NAD  HEENT: OP clear, mucus membranes moist  SKIN: warm, dry. No rashes. Neuro: No focal deficits  Musculoskeletal: Muscle strength 5/5 all ext  Psychiatric: Mood and affect normal  Neck: No JVD, no carotid bruits, no thyromegaly, no lymphadenopathy.  Lungs:Clear bilaterally, no wheezes, rhonci, crackles Cardiovascular: Regular rate and rhythm. No murmurs, gallops or rubs. Abdomen:Soft. Bowel sounds present. Non-tender.  Extremities: No lower extremity edema. Pulses are 2 + in the bilateral DP/PT.  Cardiac cath 02/06/12: Left main: 10% distal stenosis.  Left Anterior Descending Artery: Large caliber vessel that courses to the apex with mild luminal irregularities proximal and distal.  Circumflex Artery: Very large caliber vessel. The first OM is small and has an ostial 40% stenosis. The second OM is very large (5.50mm) and has a 95% stenosis in the proximal portion of the vessel.  Right Coronary Artery: Large, dominant artery with 30% proximal stenosis, 60% mid stenosis, diffuse 30%  distal disease. The PDA and PLA are patent with mild  plaque disease.  Left Ventricular Angiogram: LVEF=65%  Impression:  1. Double vessel CAD  2. Unstable angina  3. Normal LV systolic function  4. Successful PTCA/bare metal stent x 1 OM2.   EKG:  EKG is not ordered today. The ekg ordered today demonstrates   Recent Labs: 09/28/2019: ALT 21; Hemoglobin 15.4; Platelets 134 03/26/2020: BUN 24; Creatinine, Ser 1.31; Potassium 4.5; Sodium 139   Lipid Panel    Component Value Date/Time   CHOL 122 03/26/2020 1036   TRIG 267.0 (H) 03/26/2020 1036   HDL 35.10 (L) 03/26/2020 1036   CHOLHDL 3 03/26/2020 1036   VLDL 53.4 (H) 03/26/2020 1036   LDLCALC 42 04/12/2019 0801   LDLDIRECT 60.0 03/26/2020 1036     Wt Readings from Last 3 Encounters:  07/04/20 221 lb 9.6 oz (100.5 kg)  05/30/20 227 lb (103 kg)  05/16/20 227 lb (103 kg)     Other studies Reviewed: Additional studies/ records that were reviewed today include: . Review of the above records demonstrates:    Assessment and Plan:   1. CAD without angina: No chest pain. Cardiac cath June 2021 with stable CAD. Continue ASA, statin and beta blocker.    2. HTN: BP is controlled. No changes  3. Hyperlipidemia: LDL at goal in December 2021. Continue statin  Current medicines are reviewed at length with the patient today.  The patient does not have concerns regarding medicines.  The following changes have been made:  no change  Labs/ tests ordered today include:   No orders of the defined types were placed in this encounter.   Disposition:   FU with me in 12  months  Signed, Lauree Chandler, MD 07/04/2020 11:11 AM    [facility] [address], [place], [place]  [address] Phone: [phone]; Fax: [phone]

## 2020-07-04 ENCOUNTER — Encounter: Payer: Self-pay | Admitting: Cardiovascular Disease

## 2020-07-04 ENCOUNTER — Other Ambulatory Visit: Payer: Self-pay

## 2020-07-04 ENCOUNTER — Ambulatory Visit: Payer: PPO | Admitting: Cardiovascular Disease

## 2020-07-04 VITALS — BP 108/70 | HR 57 | Ht 70.0 in | Wt 221.6 lb

## 2020-07-04 DIAGNOSIS — I2510 Atherosclerotic heart disease of native coronary artery without angina pectoris: Secondary | ICD-10-CM | POA: Diagnosis not present

## 2020-07-04 DIAGNOSIS — E7800 Pure hypercholesterolemia, unspecified: Secondary | ICD-10-CM | POA: Diagnosis not present

## 2020-07-04 DIAGNOSIS — I10 Essential (primary) hypertension: Secondary | ICD-10-CM | POA: Diagnosis not present

## 2020-07-04 NOTE — Patient Instructions (Signed)

## 2020-07-11 ENCOUNTER — Other Ambulatory Visit: Payer: Self-pay | Admitting: Internal Medicine

## 2020-07-12 ENCOUNTER — Encounter: Payer: Self-pay | Admitting: Internal Medicine

## 2020-09-18 NOTE — Progress Notes (Deleted)
Chronic Care Management Pharmacy Note  09/18/2020 Name:  George Alvarez MRN:  [id number] DOB:  [birth year]  Summary:  Recommendations/Changes made from today's visit:  Plan:  Subjective: George Alvarez is an 75 y.o. year old male who is a primary patient of Paz, Alda Berthold, MD.  The CCM team was consulted for assistance with disease management and care coordination needs.    Engaged with patient by telephone for follow up visit in response to provider referral for pharmacy case management and/or care coordination services.   Consent to Services:  The patient was given information about Chronic Care Management services, agreed to services, and gave verbal consent prior to initiation of services.  Please see initial visit note for detailed documentation.   Patient Care Team: Colon Branch, MD as PCP - General (Internal Medicine) [name], MD as PCP - Cardiology (Cardiology) Ladene Artist, MD as Consulting Physician (Gastroenterology) Pieter Partridge, DO as Consulting Physician (Neurology) Irine Seal, MD as Attending Physician (Urology) Hollar, Katharine Look, MD as Referring Physician (Dermatology) Charlies Silvers, MD as Consulting Physician (Allergy and Immunology) Alda Berthold, DO as Consulting Physician (Neurology) Day, Melvenia Beam, [facility] (Inactive) as Pharmacist (Pharmacist)  Recent office visits:   Recent consult visits:   Hospital visits: {Hospital DC Yes/No:21091515}  Objective:  Lab Results  Component Value Date   CREATININE 1.31 03/26/2020   CREATININE 1.16 09/28/2019   CREATININE 1.27 09/23/2019    Lab Results  Component Value Date   HGBA1C 6.2 03/26/2020   Last diabetic Eye exam:  Lab Results  Component Value Date/Time   HMDIABEYEEXA No Retinopathy 02/17/2017 12:00 AM    Last diabetic Foot exam: No results found for: HMDIABFOOTEX      Component Value Date/Time   CHOL 122 03/26/2020 1036   TRIG 267.0 (H) 03/26/2020 1036   HDL  35.10 (L) 03/26/2020 1036   CHOLHDL 3 03/26/2020 1036   VLDL 53.4 (H) 03/26/2020 1036   LDLCALC 42 04/12/2019 0801   LDLDIRECT 60.0 03/26/2020 1036    Hepatic Function Latest Ref Rng & Units 09/28/2019 07/29/2019 04/12/2019  Total Protein 6.0 - 8.5 g/dL 6.5 6.7 6.2  Albumin 3.7 - 4.7 g/dL 4.7 4.7 4.4  AST 0 - 40 IU/L _0 ALT 0 - 44 IU/L _1 Alk Phosphatase 48 - 121 IU/L 59 58 45  Total Bilirubin 0.0 - 1.2 mg/dL 0.5 0.8 0.6  Bilirubin, Direct 0.0 - 0.3 mg/dL - - -    Lab Results  Component Value Date/Time   TSH 2.38 07/23/2017 09:19 AM   TSH 3.65 03/13/2014 08:09 AM   FREET4 0.7 05/15/2006 07:47 AM    CBC Latest Ref Rng & Units 09/28/2019 09/23/2019 07/29/2019  WBC 3.4 - 10.8 x10E3/uL 6.9 8.9 6.7  Hemoglobin 13.0 - 17.7 g/dL 15.4 14.6 15.3  Hematocrit 37.5 - 51.0 % 44.9 42.5 45.4  Platelets 150 - 450 x10E3/uL 134(L) 141.0(L) 151.0    Lab Results  Component Value Date/Time   VD25OH 45.58 03/26/2020 10:36 AM    Clinical ASCVD: Yes  The ASCVD Risk score [name] DC Jr., et al., 2013) failed to calculate for the following reasons:   The valid total cholesterol range is 130 to 320 mg/dL    Other:  Last spirometry score: 04/11/19 FVC = 80% predicted (measured best 3.53) FEV1 = 90% predicted (measured best 2.91) FEV1/FVC = 0.82  Social History   Tobacco Use  Smoking Status Never  Smokeless Tobacco  Never   BP Readings from Last 3 Encounters:  07/04/20 108/70  06/07/20 118/60  05/30/20 118/73   Pulse Readings from Last 3 Encounters:  07/04/20 (!) 57  06/07/20 65  05/30/20 (!) 58   Wt Readings from Last 3 Encounters:  07/04/20 221 lb 9.6 oz (100.5 kg)  05/30/20 227 lb (103 kg)  05/16/20 227 lb (103 kg)    Assessment: Review of patient past medical history, allergies, medications, health status, including review of consultants reports, laboratory and other test data, was performed as part of comprehensive evaluation and provision of chronic care management  services.   SDOH:  (Social Determinants of Health) assessments and interventions performed:    CCM Care Plan  Allergies  Allergen Reactions   Nifedipine Dermatitis and Rash    edema    Medications Reviewed Today     Reviewed by Cole, Tyreka S, CMA (Certified Medical Assistant) on 07/04/20 at 1055  Med List Status: <None>   Medication Order Taking? Sig Documenting Provider Last Dose Status Informant  albuterol (VENTOLIN HFA) 108 (90 Base) MCG/ACT inhaler [id number] Yes Inhale 2 puffs into the lungs every 4 (four) hours as needed for wheezing or shortness of breath. Gallagher, Joel Louis, MD Taking Active   aspirin EC 81 MG tablet [id number] Yes Take 1 tablet (81 mg total) by mouth daily. McAlhany, Christopher D, MD Taking Active Self  azelastine (ASTELIN) 0.1 % nasal spray [id number] Yes Place 2 sprays into both nostrils at bedtime as needed for rhinitis or allergies. Paz, Jose E, MD Taking Active   cholecalciferol (VITAMIN D) 1000 units tablet [id number] Yes Take 1,000 Units by mouth daily. [provider] Taking Active Self  fluticasone (FLONASE) 50 MCG/ACT nasal spray [id number] Yes One spray each nostril twice a day if needed for stuffy nose. Kim, Yoon M, DO Taking Active   fluticasone (FLOVENT HFA) 110 MCG/ACT inhaler [id number] Yes Inhale 1 puff into the lungs in the morning and at bedtime. Rinse mouth after each use. Gallagher, Joel Louis, MD Taking Active   hydrocortisone 2.5 % cream [id number] Yes Apply 1 application topically daily as needed (Eczemia).  [provider] Taking Active Self  ketoconazole (NIZORAL) 2 % shampoo [id number] Yes Apply 1 application topically once a week. [provider] Taking Active Self  metoprolol tartrate (LOPRESSOR) 25 MG tablet [id number] Yes Take 0.5 tablets (12.5 mg total) by mouth 2 (two) times daily. Paz, Jose E, MD Taking Active   montelukast (SINGULAIR) 10 MG tablet [id number] Yes TAKE 1 TABLET BY MOUTH EVERYDAY AT BEDTIME  Kim, Yoon M, DO Taking Active   Multiple Vitamin (MULTIVITAMIN WITH MINERALS) TABS tablet [id number] Yes Take 1 tablet by mouth daily. [provider] Taking Active Self  nitroGLYCERIN (NITROSTAT) 0.4 MG SL tablet [id number] Yes Place 1 tablet (0.4 mg total) under the tongue every 5 (five) minutes x 3 doses as needed for chest pain. Paz, Jose E, MD Taking Active            Med Note (COLE, TYREKA S   Wed Jul 04, 2020 10:54 AM)    olmesartan-hydrochlorothiazide (BENICAR HCT) 20-12.5 MG tablet [id number] Yes Take 1 tablet by mouth daily. Paz, Jose E, MD Taking Active   Omega-3 Fatty Acids (FISH OIL) 1200 MG CAPS [id number] Yes Take 1,200 mg by mouth daily. [provider] Taking Active Self           Med Note (COLE, TYREKA S   Wed Feb 17, 2018 10:12 AM)    pantoprazole (  PROTONIX) 40 MG tablet [id number] Yes Take 1 tablet (40 mg total) by mouth 2 (two) times daily. Colon Branch, MD Taking Active   rosuvastatin (CRESTOR) 40 MG tablet [id number] Yes Take 1 tablet (40 mg total) by mouth daily. Colon Branch, MD Taking Active   tadalafil (CIALIS) 5 MG tablet [id number] Yes Take 5 mg by mouth daily as needed. [provider] Taking Active Self  tamsulosin (FLOMAX) 0.4 MG CAPS capsule [id number] Yes Take 1 capsule (0.4 mg total) by mouth daily. Ashok Pall, MD Taking Active Self  triamcinolone cream (KENALOG) 0.1 % [id number] Yes Apply 1 application topically daily as needed (Eczema).  [provider] Taking Active Self            Patient Active Problem List   Diagnosis Date Noted   Other atopic dermatitis 11/22/2019   Chest pain of uncertain etiology    Viral upper respiratory tract infection with cough 07/06/2017   Current use of beta blocker 08/09/2015   Moderate persistent asthma without complication 33/83/2919   Other allergic rhinitis 02/20/2015   PCP NOTES >>> 01/11/2015   Cerebellar infarct ([place]) 06/26/2014   Annual physical exam 02/20/2014   Hyperglycemia  02/05/2013   Benign paroxysmal positional vertigo 11/19/2012   CAD (coronary artery disease) 02/07/2012   Gastroesophageal reflux disease without esophagitis 01/01/2009   SKIN CANCER, HX OF 12/28/2007   Hyperlipidemia 06/16/2007   Essential hypertension 06/16/2007   Asthma, chronic 06/16/2007   Spinal stenosis 03/01/2007   NEPHROLITHIASIS, HX OF 05/14/2006    Immunization History  Administered Date(s) Administered   Hepatitis B 01/31/2009, 03/05/2009   Influenza Split 12/20/2014   Influenza Whole 12/28/2007, 12/28/2008   Influenza, High Dose Seasonal PF 01/31/2014, 12/27/2018, 02/06/2020   Influenza-Unspecified 01/06/2012, 12/21/2015, 11/15/2016, 12/23/2017   Moderna Sars-Covid-2 Vaccination 05/07/2019, 06/06/2019, 02/06/2020, 07/24/2020   Pneumococcal Conjugate-13 02/20/2014   Pneumococcal Polysaccharide-23 01/22/2017   Pneumococcal-Unspecified 01/06/2012   Td 01/29/2009   Tdap 06/05/2013, 03/10/2018   Zoster Recombinat (Shingrix) 10/28/2018, 02/26/2019   Zoster, Live 01/06/2012    Conditions to be addressed/monitored: {CCM ASSESSMENT DISEASE OPTIONS:25047}  There are no care plans that you recently modified to display for this patient.   Medication Assistance: {MEDASSISTANCEINFO:25044}  Patient's preferred pharmacy is:  [id number] [place], [place] - [address]Phone: [phone]: [phone] Uses pill box? {Yes or If no, why not?:20788} Pt endorses ***% compliance  Follow Up:  {FOLLOWUP:24991}  Plan: {CM FOLLOW UP PLAN:25073}  SIG***

## 2020-09-19 ENCOUNTER — Ambulatory Visit (INDEPENDENT_AMBULATORY_CARE_PROVIDER_SITE_OTHER): Payer: PPO | Admitting: Pharmacist

## 2020-09-19 DIAGNOSIS — I2510 Atherosclerotic heart disease of native coronary artery without angina pectoris: Secondary | ICD-10-CM | POA: Diagnosis not present

## 2020-09-19 DIAGNOSIS — R739 Hyperglycemia, unspecified: Secondary | ICD-10-CM

## 2020-09-19 DIAGNOSIS — I10 Essential (primary) hypertension: Secondary | ICD-10-CM

## 2020-09-19 DIAGNOSIS — J4541 Moderate persistent asthma with (acute) exacerbation: Secondary | ICD-10-CM

## 2020-09-19 DIAGNOSIS — E785 Hyperlipidemia, unspecified: Secondary | ICD-10-CM | POA: Diagnosis not present

## 2020-09-19 NOTE — Chronic Care Management (AMB) (Signed)
Chronic Care Management Pharmacy Note  09/19/2020 Name:  George Alvarez MRN:  [id number] DOB:  Jun 18, 1945  Summary: Patient is doing well;  Discussed elevated Tg and low HDL and lifestyle changes that can help improve.   Recommendations/Changes made from today's visit: Continue to exercise regularly - goal of at least 150 minutes per week of moderate intensity exercise Limit intake of sugar and carbohydrates Consider increasing Fish Oil to 3655m if Tg continue to be above goal.    Subjective: George GHUMANis an 75y.o. year old male who is a primary patient of Paz, JAlda Berthold MD.  The CCM team was consulted for assistance with disease management and care coordination needs.    Engaged with patient by telephone for follow up visit in response to provider referral for pharmacy case management and/or care coordination services.   Consent to Services:  The patient was given information about Chronic Care Management services, agreed to services, and gave verbal consent prior to initiation of services.  Please see initial visit note for detailed documentation.   Patient Care Team: PColon Branch MD as PCP - General (Internal Medicine) MBurnell Blanks MD as PCP - Cardiology (Cardiology) SLadene Artist MD as Consulting Physician (Gastroenterology) JPieter Partridge DO as Consulting Physician (Neurology) WIrine Seal MD as Attending Physician (Urology) Hollar, CKatharine Look MD as Referring Physician (Dermatology) BCharlies Silvers MD as Consulting Physician (Allergy and Immunology) PAlda Berthold DO as Consulting Physician (Neurology) ECherre Robins PharmD (Pharmacist)  Recent office visits: 03/26/2020 - PCP (Dr PLarose Kells annual physical; no med changes  Recent consult visits: 07/04/2020 - Cardio (Dr MAngelena Form F/U CAD; No med changes. 06/07/2020 Allergy and Immunology (Dr GErnst Bowler F/U visit; No med changes. 05/30/2020 - Colonoscopy (Dr SFuller Plan Seven sessile polyps  removed; note smalll diverticula but no evidence of bleeding; Internal hemorrhoid - small Grade 1; Repeat colonoscopy in 3 years [facility]   Hospital visits: None in previous 6 months  Objective:  Lab Results  Component Value Date   CREATININE 1.31 03/26/2020   CREATININE 1.16 09/28/2019   CREATININE 1.27 09/23/2019    Lab Results  Component Value Date   HGBA1C 6.2 03/26/2020   Last diabetic Eye exam:  Lab Results  Component Value Date/Time   HMDIABEYEEXA No Retinopathy 02/17/2017 12:00 AM    Last diabetic Foot exam: No results found for: HMDIABFOOTEX      Component Value Date/Time   CHOL 122 03/26/2020 1036   TRIG 267.0 (H) 03/26/2020 1036   HDL 35.10 (L) 03/26/2020 1036   CHOLHDL 3 03/26/2020 1036   VLDL 53.4 (H) 03/26/2020 1036   LDLCALC 42 04/12/2019 0801   LDLDIRECT 60.0 03/26/2020 1036    Hepatic Function Latest Ref Rng & Units 09/28/2019 07/29/2019 04/12/2019  Total Protein 6.0 - 8.5 g/dL 6.5 6.7 6.2  Albumin 3.7 - 4.7 g/dL 4.7 4.7 4.4  AST 0 - 40 IU/L 21 20 21   ALT 0 - 44 IU/L 21 21 19   Alk Phosphatase 48 - 121 IU/L 59 58 45  Total Bilirubin 0.0 - 1.2 mg/dL 0.5 0.8 0.6  Bilirubin, Direct 0.0 - 0.3 mg/dL - - -    Lab Results  Component Value Date/Time   TSH 2.38 07/23/2017 09:19 AM   TSH 3.65 03/13/2014 08:09 AM   FREET4 0.7 05/15/2006 07:47 AM    CBC Latest Ref Rng & Units 09/28/2019 09/23/2019 07/29/2019  WBC 3.4 - 10.8 x10E3/uL 6.9 8.9 6.7  Hemoglobin  13.0 - 17.7 g/dL 15.4 14.6 15.3  Hematocrit 37.5 - 51.0 % 44.9 42.5 45.4  Platelets 150 - 450 x10E3/uL 134(L) 141.0(L) 151.0    Lab Results  Component Value Date/Time   VD25OH 45.58 03/26/2020 10:36 AM    Clinical ASCVD: Yes  The ASCVD Risk score [name] DC Jr., et al., 2013) failed to calculate for the following reasons:   The valid total cholesterol range is 130 to 320 mg/dL     Social History   Tobacco Use  Smoking Status Never  Smokeless Tobacco Never   BP  Readings from Last 3 Encounters:  07/04/20 108/70  06/07/20 118/60  05/30/20 118/73   Pulse Readings from Last 3 Encounters:  07/04/20 (!) 57  06/07/20 65  05/30/20 (!) 58   Wt Readings from Last 3 Encounters:  07/04/20 221 lb 9.6 oz (100.5 kg)  05/30/20 227 lb (103 kg)  05/16/20 227 lb (103 kg)    Assessment: Review of patient past medical history, allergies, medications, health status, including review of consultants reports, laboratory and other test data, was performed as part of comprehensive evaluation and provision of chronic care management services.   SDOH:  (Social Determinants of Health) assessments and interventions performed:  SDOH Interventions    Flowsheet Row Most Recent Value  SDOH Interventions   Physical Activity Interventions Intervention Not Indicated  Transportation Interventions Intervention Not Indicated       CCM Care Plan  Allergies  Allergen Reactions   Nifedipine Dermatitis and Rash    edema    Medications Reviewed Today     Reviewed by Cherre Robins, PharmD (Pharmacist) on 09/19/20 at [place] List Status: <None>   Medication Order Taking? Sig Documenting Provider Last Dose Status Informant  albuterol (VENTOLIN HFA) 108 (90 Base) MCG/ACT inhaler [id number] Yes Inhale 2 puffs into the lungs every 4 (four) hours as needed for wheezing or shortness of breath. Valentina Shaggy, MD Taking Active   aspirin EC 81 MG tablet [id number] Yes Take 1 tablet (81 mg total) by mouth daily. [name], MD Taking Active Self  azelastine (ASTELIN) 0.1 % nasal spray [id number] Yes Place 2 sprays into both nostrils at bedtime as needed for rhinitis or allergies. Colon Branch, MD Taking Active   cetirizine (ZYRTEC) 10 MG tablet [id number] Yes Take 10 mg by mouth daily. [provider] Taking Active   cholecalciferol (VITAMIN D) 1000 units tablet [id number] Yes Take 1,000 Units by mouth daily. [provider] Taking Active Self   fluticasone (FLONASE) 50 MCG/ACT nasal spray [id number] Yes One spray each nostril twice a day if needed for stuffy nose. Garnet Sierras, DO Taking Active   fluticasone (FLOVENT HFA) 110 MCG/ACT inhaler [id number] Yes Inhale 1 puff into the lungs in the morning and at bedtime. Rinse mouth after each use. Valentina Shaggy, MD Taking Active   hydrocortisone 2.5 % cream [id number] Yes Apply 1 application topically daily as needed (Eczema). [provider] Taking Active Self  ketoconazole (NIZORAL) 2 % shampoo [id number] Yes Apply 1 application topically once a week. [provider] Taking Active Self  metoprolol tartrate (LOPRESSOR) 25 MG tablet [id number] Yes Take 0.5 tablets (12.5 mg total) by mouth 2 (two) times daily. Colon Branch, MD Taking Active   montelukast (SINGULAIR) 10 MG tablet [id number] Yes TAKE 1 TABLET BY MOUTH EVERYDAY AT BEDTIME Garnet Sierras, DO Taking Active   Multiple Vitamin (MULTIVITAMIN WITH MINERALS) TABS tablet [id number] Yes Take 1 tablet by mouth  daily. [provider] Taking Active Self  nitroGLYCERIN (NITROSTAT) 0.4 MG SL tablet [id number] No Place 1 tablet (0.4 mg total) under the tongue every 5 (five) minutes x 3 doses as needed for chest pain.  Patient not taking: Reported on 09/19/2020   Colon Branch, MD Not Taking Active            Med Note Gerald Dexter Jul 04, 2020 10:54 AM)    olmesartan-hydrochlorothiazide (BENICAR HCT) 20-12.5 MG tablet [id number] Yes Take 1 tablet by mouth daily. Colon Branch, MD Taking Active   Omega-3 Fatty Acids (FISH OIL) 1200 MG CAPS [id number] Yes Take 1,200 mg by mouth daily. [provider] Taking Active Self           Med Note Landry Mellow, Marvetta Gibbons   Wed Feb 17, 2018 10:12 AM)    pantoprazole (PROTONIX) 40 MG tablet [id number] Yes Take 1 tablet (40 mg total) by mouth 2 (two) times daily. Colon Branch, MD Taking Active   rosuvastatin (CRESTOR) 40 MG tablet [id number] Yes Take 1 tablet (40 mg total) by mouth daily.  Colon Branch, MD Taking Active   tadalafil (CIALIS) 5 MG tablet [id number] Yes Take 5 mg by mouth daily as needed. [provider] Taking Active Self  tamsulosin (FLOMAX) 0.4 MG CAPS capsule [id number] Yes Take 1 capsule (0.4 mg total) by mouth daily. Ashok Pall, MD Taking Active Self  triamcinolone cream (KENALOG) 0.1 % [id number] Yes Apply 1 application topically daily as needed (Eczema).  [provider] Taking Active Self            Patient Active Problem List   Diagnosis Date Noted   Other atopic dermatitis 11/22/2019   Chest pain of uncertain etiology    Viral upper respiratory tract infection with cough 07/06/2017   Current use of beta blocker 08/09/2015   Moderate persistent asthma without complication 36/14/4315   Other allergic rhinitis 02/20/2015   PCP NOTES >>> 01/11/2015   Cerebellar infarct ([place]) 06/26/2014   Annual physical exam 02/20/2014   Hyperglycemia 02/05/2013   Benign paroxysmal positional vertigo 11/19/2012   CAD (coronary artery disease) 02/07/2012   Gastroesophageal reflux disease without esophagitis 01/01/2009   SKIN CANCER, HX OF 12/28/2007   Hyperlipidemia 06/16/2007   Essential hypertension 06/16/2007   Asthma, chronic 06/16/2007   Spinal stenosis 03/01/2007   NEPHROLITHIASIS, HX OF 05/14/2006    Immunization History  Administered Date(s) Administered   Hepatitis B 01/31/2009, 03/05/2009   Influenza Split 12/20/2014   Influenza Whole 12/28/2007, 12/28/2008   Influenza, High Dose Seasonal PF 01/31/2014, 12/27/2018, 02/06/2020   Influenza-Unspecified 01/06/2012, 12/21/2015, 11/15/2016, 12/23/2017   Moderna Sars-Covid-2 Vaccination 05/07/2019, 06/06/2019, 02/06/2020, 07/24/2020   Pneumococcal Conjugate-13 02/20/2014   Pneumococcal Polysaccharide-23 01/22/2017   Pneumococcal-Unspecified 01/06/2012   Td 01/29/2009   Tdap 06/05/2013, 03/10/2018   Zoster Recombinat (Shingrix) 10/28/2018, 02/26/2019   Zoster, Live 01/06/2012     Conditions to be addressed/monitored: CAD, HTN, HLD, Hypertriglyceridemia, and GERD; pre DM; BPH; elevated PSA; asthma; allergies  Care Plan : General Pharmacy (Adult)  Updates made by Cherre Robins, PHARMD since 09/19/2020 12:00 AM     Problem: Medication and Chronic Care Management   Priority: High  Onset Date: 09/19/2020     Long-Range Goal: Take Medications as prescribed to manage chornic conditions   Start Date: 09/19/2020  This Visit's Progress: On track  Priority: High  Note:   Current Barriers:  Unable to maintain control of elevated triglycerides Chronic Disease  Management support, education, and care coordination needs related to Asthma, hypertension, elevated cholesterol, heart disease / CAD Pre-Diabetes, Allergic Rhinitis, Eczema, GERD / acid reflux, BPH  Pharmacist Clinical Goal(s):  Over the next 180 days, patient will achieve control of hypertriglyceridemia as evidenced by Tg <150 maintain control of HTN, LDL and pre DM as evidenced by maintaining goals listed below  through collaboration with PharmD and provider.   Interventions: 1:1 collaboration with Colon Branch, MD regarding development and update of comprehensive plan of care as evidenced by provider attestation and co-signature Inter-disciplinary care team collaboration (see longitudinal plan of care) Comprehensive medication review performed; medication list updated in electronic medical record  Hypertension Controlled; BP goal <130/80 Home BP readings range form 125-135 / 70's Denies hypotensive symptoms Current regimen:  Metoprolol Tartrate 97m - take 0.5 tablet = 12.520m twice daily Olmesartan-hctz 20-12.64m164m take one tablet daily Patient self care activities - Over the next 180 days, patient will: Check blood pressure 1-2 times per week, document, and provide at future appointments Ensure daily salt intake < 2300 mg/day Maintain hypertension medication regimen.   Hyperlipidemia/CAD: LDL at  goal but Tg elevated and HDL low; Goals:  LDL < 70; HDL > 40 and triglycerides <150 Previously tried Simvastatin - changed due to efficacy Denies myalgias or recent chest pain. Has NTG to use if needed but patient report he has never needed to take NTG.  [facility] 3 times per week for 60 minutes Diet: avoids fried foods; tries to limit CHO and sugar intake Current regimen:  Rosuvastatin 65m41mce daily Fish oil 1200mg66mly  Aspirin 81mg 69my Interventions:  Reminded about limiting intake of sugar and food high in carbohydrates (bread, pasta, rice, potatoes) as these can increase triglycerides Continue to exercise regularly with goal of at least 150 minutes of moderate intensity exercise per week. Exercise will also help lower triglycerides and increase HDL (good cholesterol)  Maintain cholesterol medication regimen.  If Tg continue to be >150 could consider increasing Fish oil to 3600mg d2m   Pre-Diabetes At goal; A1c goal <6.5% See above regarding exercise and diet. Current regimen:  Diet and exercise management   Patient self care activities - Over the next 180 days, patient will: Maintain a1c <6.5% Limit intake of sugar and food high in carbohydrates (bread, pasta, rice, potatoes) as these can increase triglycerides and blood glucose / sugar Continue to exercise regularly with goal of at least 150 minutes of moderate intensity exercise per week. Exercise will also help prevent weight gain and improve blood glucose / sugar  Mild Persistent Asthma / allergic rhinitis:  Controlled; no recent exacerbations Pulmonary Functions Testing Results: none found Current regimen: Flovent 110mcg -63maler 2 puffs into lungs twice a day Albuterol HFA inhaler - inhaler 2 puffs into lungs every 4 to 6 hours if needed for shortness of breath Cetirizine 10mg onc49mily as needed for allergies Montelukast 10mg - ta11m tablet daily at bedtime Azelastine 0.1% nasal spray as needed   Fluticasone / Flonase nasal spray as needed  Interventions:  Reviewed maintenance versus rescue inhalers Reviewed when to take each as needed medication above based on symptoms Patient encouraged to call clinical pharmacist if he enters coverage gap with Flovent to assess for possible patient assistance program.   Medication management [facility] Comprehensive medication review performed. Continue current medication management strategy Refill history reviewed and no adherence issues noted.   Patient Goals/Self-Care Activities Over the next  180 days, patient will:  take medications as prescribed, check blood pressure 1 to 2 times , document, and provide at future appointments, and target a minimum of 150 minutes of moderate intensity exercise weekly; limit intake of sugar and carbohydrates  Follow Up Plan: Telephone follow up appointment with care management team member scheduled for:  6 months        Medication Assistance:  none needed at this time but patient is to call if he enters Medicare coverage gap for possible assistance with Flovent  Patient's preferred pharmacy is:  [id number] [place], [place] [address] [place] [address]Phone: [phone]: [phone]  Follow Up:  Patient agrees to Care Plan and Follow-up.  Plan: Telephone follow up appointment with care management team member scheduled for:  6 months  TCherre Robins PharmD Clinical Pharmacist [facility] [phone]

## 2020-09-19 NOTE — Patient Instructions (Signed)
Visit Information  PATIENT GOALS:  Goals Addressed             This Visit's Progress    Blood Pressure Goal <140/90 and also >90/60   On track    Check Blood Pressure 1 to 2 times per week and record readings   On track    [place]   On track    [place] (see longitudinal plan of care for additional care plan information)  Current Barriers:  Chronic Disease Management support, education, and care coordination needs related to Asthma, hypertension, elevated cholesterol, heart disease / CAD Pre-Diabetes, Allergic Rhinitis, Eczema, GERD / acid reflux, BPH   Hypertension BP Readings from Last 3 Encounters:  07/04/20 108/70  06/07/20 118/60  05/30/20 118/73  Pharmacist Clinical Goal(s): Over the next 180 days, patient will work with PharmD and providers to maintain BP goal <130/80 Current regimen:  Metoprolol Tartrate 25mg  - take 0.5 tablet = 12.5mg   twice daily Olmesartan-hctz 20-12.5mg  - take one tablet daily Patient self care activities - Over the next 180 days, patient will: Check blood pressure 1-2 times per week, document, and provide at future appointments Ensure daily salt intake < 2300 mg/day Maintain hypertension medication regimen.   Hyperlipidemia/CAD: Lipid Panel     Component Value Date/Time   CHOL 122 03/26/2020 1036   TRIG 267.0 (H) 03/26/2020 1036   HDL 35.10 (L) 03/26/2020 1036   CHOLHDL 3 03/26/2020 1036   VLDL 53.4 (H) 03/26/2020 1036   LDLDIRECT 60.0 03/26/2020 1036   Pharmacist Clinical Goal(s): Over the next 180 days, patient will work with PharmD and providers to maintain LDL goal < 70; HDL > 40 and triglycerides <150 Current regimen:  Rosuvastatin 40mg  once daily Fish oil 1200mg  daily  Aspirin 81mg  daily Patient self care activities - Over the next 180 days, patient will: Limit intake of sugar and food high in carbohydrates (bread, pasta, rice, potatoes) as these can increase triglycerides Continue to  exercise regularly with goal of at least 150 minutes of moderate intensity exercise per week. Exercise will also help lower triglycerides and increase HDL (good cholesterol)  Maintain cholesterol medication regimen.   Pre-Diabetes Lab Results  Component Value Date/Time   HGBA1C 6.2 03/26/2020 10:36 AM   HGBA1C 6.1 09/23/2019 02:05 PM   HGBA1C 5.8 11/04/2018 12:00 AM  Pharmacist Clinical Goal(s): Over the next 180 days, patient will work with PharmD and providers to maintain A1c goal <6.5% Current regimen:  Diet and exercise management   Patient self care activities - Over the next 180 days, patient will: Maintain a1c <6.5% Limit intake of sugar and food high in carbohydrates (bread, pasta, rice, potatoes) as these can increase triglycerides and blood glucose / sugar Continue to exercise regularly with goal of at least 150 minutes of moderate intensity exercise per week. Exercise will also help prevent weight gain and improve blood glucose / sugar  Asthma / Allergies Pharmacist Clinical Goal(s): Over the next 180 day, patient will work with Phamd and providers to maintain control of asthma and allergies Current regimen: Flovent 172mcg - inhaler 2 puffs into lungs twice a day Albuterol HFA inhaler - inhaler 2 puffs into lungs every 4 to 6 hours if needed for shortness of breath Cetirizine 10mg  once daily as needed for allergies Montelukast 10mg  - take 1 tablet daily at bedtime Azelastine 0.1% nasal spray as needed  Fluticasone / Flonase nasal spray as needed  Recommendations and patient self care activities - over  the next 180 days, patient will: Continue current regimen for asthma and allergies Call clinical pharmacist if he enters coverage gap with Flovent to assess for possible patient assistance program.   Medication management Pharmacist Clinical Goal(s): Over the next 180 days, patient will work with PharmD and providers to maintain optimal medication adherence [facility] Comprehensive medication review performed. Continue current medication management strategy Patient self care activities - Over the next 180 days, patient will: Focus on medication adherence by filling and taking medications appropriately  Take medications as prescribed Report any questions or concerns to PharmD and/or provider(s)  Please see past updates related to this goal by clicking on the "Past Updates" button in the selected goal        COMPLETED: Complete Vitamin D lab at next office visit       Done 03/2020         Patient verbalizes understanding of instructions provided today and agrees to view in [place].   Telephone follow up appointment with care management team member scheduled for: 6 months  Cherre Robins, PharmD Clinical Pharmacist [place] [phone]

## 2020-09-24 ENCOUNTER — Ambulatory Visit: Payer: PPO | Admitting: Internal Medicine

## 2020-10-01 ENCOUNTER — Other Ambulatory Visit: Payer: Self-pay

## 2020-10-01 ENCOUNTER — Encounter: Payer: Self-pay | Admitting: Internal Medicine

## 2020-10-01 ENCOUNTER — Ambulatory Visit (INDEPENDENT_AMBULATORY_CARE_PROVIDER_SITE_OTHER): Payer: PPO | Admitting: Internal Medicine

## 2020-10-01 VITALS — BP 122/72 | HR 82 | Temp 98.2°F | Resp 16 | Ht 70.0 in | Wt 225.2 lb

## 2020-10-01 DIAGNOSIS — I10 Essential (primary) hypertension: Secondary | ICD-10-CM

## 2020-10-01 DIAGNOSIS — E785 Hyperlipidemia, unspecified: Secondary | ICD-10-CM

## 2020-10-01 DIAGNOSIS — R739 Hyperglycemia, unspecified: Secondary | ICD-10-CM | POA: Diagnosis not present

## 2020-10-01 NOTE — Patient Instructions (Signed)
Check the  blood pressure regulalrly BP GOAL is between 110/65 and  135/85. If it is consistently higher or lower, let me know   GO TO THE LAB : Get the blood work     [place] back for a physical by 03-2021      "Living will", "[place] of attorney": Advanced care planning  (If you already have a living will or healthcare power of attorney, please bring the copy to be scanned in your chart.)  Advance care planning is a process that supports adults in  understanding and sharing their preferences regarding future medical care.   The patient's preferences are recorded in documents called Advance Directives.    Advanced directives are completed (and can be modified at any time) while the patient is in full mental capacity.   The documentation should be available at all times to the patient, the family and the healthcare providers.  Bring in a copy to be scanned in your chart is an excellent idea and is recommended   This legal documents direct treatment decision making and/or appoint a surrogate to make the decision if the patient is not capable to do so.    Advance directives can be documented in many types of formats,  documents have names such as:  Lliving will  Durable power of attorney for healthcare (healthcare proxy or healthcare power of attorney)  Combined directives  Physician orders for life-sustaining treatment    More information at:  [email or web address]

## 2020-10-01 NOTE — Progress Notes (Signed)
Subjective:    Patient ID: George Alvarez, male    DOB: [birth year], 75 y.o.   MRN: [id number]  DOS:  10/01/2020 Type of visit - description: ROV Since the last office visit he is doing well. Saw cardiology, note reviewed. He specifically denies chest pain or difficulty breathing. Does well with diet, tries to exercise regularly.   Review of Systems See above   Past Medical History:  Diagnosis Date   Allergy    seasonal   Anal fissure    Arthritis    Asthma, chronic 06/16/2007   Qualifier: Diagnosis of  By: Linna Darner MD, Gwyndolyn Saxon   Onset:as child Triggers (environmental, infectious, allergic): all, mainly environmental triggers Rescue inhaler DJM:EQASTM Maintenance medications/ response:Singulair,Qvar Smoking history:never Family history pulmonary disease: no     Basal cell carcinoma of chest wall 2016   1.8 cm, treated with electrodessication and currettage   Benign paroxysmal positional vertigo 11/19/2012   Diagnosed at [facility], [place]  Physical therapy appointment pending    BPH (benign prostatic hyperplasia)    With urinary obstruction   Coronary artery disease    a. 02/2012 Cath/PCI: LM 10, LAD min irregs, LCX large, OM1 sm, 40 ost, OM2 95p (5.0x16 Veriflex & 4.5x12 Veriflex BMS'), RCA 30p, 15m, 30d, PDA/PLA min irregs, EF 65%   Diverticulosis    Erectile dysfunction due to arterial insufficiency    Fundic gland polyps of stomach, benign 2010   GERD (gastroesophageal reflux disease)    History of elevated PSA    History of kidney stones    Hyperlipidemia    Hypertension    Nocturia    Perennial allergic rhinitis    Peyronie's disease    Skin cancer    Basal and squamous cell cancers, greater than 20   Stroke ([place]) 2016   Tubular adenoma of colon 2010    Past Surgical History:  Procedure Laterality Date   COLONOSCOPY  2016   COLONOSCOPY  06/2020   CORONARY ANGIOPLASTY WITH STENT PLACEMENT  02/06/2012   OM2  bare metal    EPIDURAL BLOCK INJECTION   04-2015   Dr Brien Few   epidural steroids  2007, 2009   X 2 @ cervical &, lumbar)   HERNIA REPAIR     INTRAVASCULAR ULTRASOUND  02/06/2012   Procedure: INTRAVASCULAR ULTRASOUND;  Surgeon: [name], MD;  Location: [facility] CATH LAB;  Service: Cardiovascular;;   LEFT HEART CATH AND CORONARY ANGIOGRAPHY N/A 09/30/2019   Procedure: LEFT HEART CATH AND CORONARY ANGIOGRAPHY;  Surgeon: [name], MD;  Location: [place] CV LAB;  Service: Cardiovascular;  Laterality: N/A;   LEFT HEART CATHETERIZATION WITH CORONARY ANGIOGRAM N/A 02/06/2012   Procedure: LEFT HEART CATHETERIZATION WITH CORONARY ANGIOGRAM;  Surgeon: [name], MD;  Location: [facility] CATH LAB;  Service: Cardiovascular;  Laterality: N/A;   LUMBAR LAMINECTOMY/DECOMPRESSION MICRODISCECTOMY N/A 10/21/2017   Procedure: Lumbar Two-Three, Lumbar Four-Five Discectomy;  Surgeon: Ashok Pall, MD;  Location: [place];  Service: Neurosurgery;  Laterality: N/A;   PERCUTANEOUS CORONARY STENT INTERVENTION (PCI-S)  02/06/2012   Procedure: PERCUTANEOUS CORONARY STENT INTERVENTION (PCI-S);  Surgeon: [name], MD;  Location: [facility] CATH LAB;  Service: Cardiovascular;;   septoplasty     TONSILLECTOMY AND ADENOIDECTOMY     TRIGGER FINGER RELEASE     UPPER GASTROINTESTINAL ENDOSCOPY  2011   gastric polyps   VASECTOMY      Allergies as of 10/01/2020       Reactions   Nifedipine Dermatitis, Rash  edema        Medication List        Accurate as of October 01, 2020 11:59 PM. If you have any questions, ask your nurse or doctor.          albuterol 108 (90 Base) MCG/ACT inhaler Commonly known as: VENTOLIN HFA Inhale 2 puffs into the lungs every 4 (four) hours as needed for wheezing or shortness of breath.   aspirin EC 81 MG tablet Take 1 tablet (81 mg total) by mouth daily.   azelastine 0.1 % nasal spray Commonly known as: ASTELIN Place 2 sprays into both nostrils at bedtime as needed for rhinitis or  allergies.   cetirizine 10 MG tablet Commonly known as: ZYRTEC Take 10 mg by mouth daily.   cholecalciferol 1000 units tablet Commonly known as: VITAMIN D Take 1,000 Units by mouth daily.   Fish Oil 1200 MG Caps Take 1,200 mg by mouth daily.   fluticasone 110 MCG/ACT inhaler Commonly known as: FLOVENT HFA Inhale 1 puff into the lungs in the morning and at bedtime. Rinse mouth after each use.   fluticasone 50 MCG/ACT nasal spray Commonly known as: FLONASE One spray each nostril twice a day if needed for stuffy nose.   hydrocortisone 2.5 % cream Apply 1 application topically daily as needed (Eczema).   ketoconazole 2 % shampoo Commonly known as: NIZORAL Apply 1 application topically once a week.   metoprolol tartrate 25 MG tablet Commonly known as: LOPRESSOR Take 0.5 tablets (12.5 mg total) by mouth 2 (two) times daily.   montelukast 10 MG tablet Commonly known as: SINGULAIR TAKE 1 TABLET BY MOUTH EVERYDAY AT BEDTIME   multivitamin with minerals Tabs tablet Take 1 tablet by mouth daily.   nitroGLYCERIN 0.4 MG SL tablet Commonly known as: NITROSTAT Place 1 tablet (0.4 mg total) under the tongue every 5 (five) minutes x 3 doses as needed for chest pain.   olmesartan-hydrochlorothiazide 20-12.5 MG tablet Commonly known as: BENICAR HCT Take 1 tablet by mouth daily.   pantoprazole 40 MG tablet Commonly known as: PROTONIX Take 1 tablet (40 mg total) by mouth 2 (two) times daily.   rosuvastatin 40 MG tablet Commonly known as: CRESTOR Take 1 tablet (40 mg total) by mouth daily.   tadalafil 5 MG tablet Commonly known as: CIALIS Take 5 mg by mouth daily as needed.   tamsulosin 0.4 MG Caps capsule Commonly known as: FLOMAX Take 1 capsule (0.4 mg total) by mouth daily.   triamcinolone cream 0.1 % Commonly known as: KENALOG Apply 1 application topically daily as needed (Eczema).           Objective:   Physical Exam BP 122/72 (BP Location: Left Arm, Patient  Position: Sitting, Cuff Size: Normal)   Pulse 82   Temp 98.2 F (36.8 C) (Oral)   Resp 16   Ht 5\' 10"  (1.778 m)   Wt 225 lb 4 oz (102.2 kg)   SpO2 96%   BMI 32.32 kg/m  General:   Well developed, NAD, BMI noted. HEENT:  Normocephalic . Face symmetric, atraumatic Lungs:  CTA B Normal respiratory effort, no intercostal retractions, no accessory muscle use. Heart: RRR,  no murmur.  Lower extremities: no pretibial edema bilaterally  Skin: Not pale. Not jaundice Neurologic:  alert & oriented X3.  Speech normal, gait appropriate for age and unassisted Psych--  Cognition and judgment appear intact.  Cooperative with normal attention span and concentration.  Behavior appropriate. No anxious or depressed appearing.  Assessment      Assessment  Prediabetes HTN Hyperlipidemia CV: Dr Angelena Form --CAD --Stroke, seen in a MRI Asthma- Dr Darcey Nora  MSK --DJD knees used to see Dr Len Childs 2016, local injections --Spinal stenosis (neck-back) - Dr Cyndy Freeze dx ~ 2008 via MRI neck, back, had local injections 2008; then 04-2015 GERD BPV, off balance (seen by neuro 2020)  GU: --Elevated PSA -- Dr Jeffie Pollock --Peyronie's  Disease Skin cancer : [facility] Dr Susie Cassette   PLAN: Prediabetes: Doing well with lifestyle, check A1c HTN: BP today is very good, continue metoprolol, Benicar HCT. High cholesterol: Controlled on rosuvastatin. CAD Saw cardiology 07/04/2020, no changes made.  Asymptomatic Preventive care: Had COVID-vaccine #4 recently, recommend to have a flu shot this fall; POA recommended  RTC 6 months CPX    This visit occurred during the SARS-CoV-2 public health emergency.  Safety protocols were in place, including screening questions prior to the visit, additional usage of staff PPE, and extensive cleaning of exam room while observing appropriate contact time as indicated for disinfecting solutions.

## 2020-10-02 LAB — CBC WITH DIFFERENTIAL/PLATELET
Basophils Absolute: 0.1 10*3/uL (ref 0.0–0.1)
Basophils Relative: 0.9 % (ref 0.0–3.0)
Eosinophils Absolute: 0.2 10*3/uL (ref 0.0–0.7)
Eosinophils Relative: 3.6 % (ref 0.0–5.0)
HCT: 40.9 % (ref 39.0–52.0)
Hemoglobin: 14 g/dL (ref 13.0–17.0)
Lymphocytes Relative: 29.9 % (ref 12.0–46.0)
Lymphs Abs: 2 10*3/uL (ref 0.7–4.0)
MCHC: 34.3 g/dL (ref 30.0–36.0)
MCV: 88.9 fl (ref 78.0–100.0)
Monocytes Absolute: 0.5 10*3/uL (ref 0.1–1.0)
Monocytes Relative: 8.2 % (ref 3.0–12.0)
Neutro Abs: 3.8 10*3/uL (ref 1.4–7.7)
Neutrophils Relative %: 57.4 % (ref 43.0–77.0)
Platelets: 130 10*3/uL — ABNORMAL LOW (ref 150.0–400.0)
RBC: 4.6 Mil/uL (ref 4.22–5.81)
RDW: 13.2 % (ref 11.5–15.5)
WBC: 6.6 10*3/uL (ref 4.0–10.5)

## 2020-10-02 LAB — COMPREHENSIVE METABOLIC PANEL
ALT: 21 U/L (ref 0–53)
AST: 23 U/L (ref 0–37)
Albumin: 4.4 g/dL (ref 3.5–5.2)
Alkaline Phosphatase: 49 U/L (ref 39–117)
BUN: 16 mg/dL (ref 6–23)
CO2: 27 mEq/L (ref 19–32)
Calcium: 9.5 mg/dL (ref 8.4–10.5)
Chloride: 104 mEq/L (ref 96–112)
Creatinine, Ser: 1.22 mg/dL (ref 0.40–1.50)
GFR: 59.19 mL/min — ABNORMAL LOW (ref >60.00)
Glucose, Bld: 108 mg/dL — ABNORMAL HIGH (ref 70–99)
Potassium: 4 mEq/L (ref 3.5–5.1)
Sodium: 140 mEq/L (ref 135–145)
Total Bilirubin: 0.7 mg/dL (ref 0.2–1.2)
Total Protein: 6.2 g/dL (ref 6.0–8.3)

## 2020-10-02 LAB — HEMOGLOBIN A1C: Hgb A1c MFr Bld: 6.5 % (ref 4.6–6.5)

## 2020-10-02 NOTE — Assessment & Plan Note (Signed)
Prediabetes: Doing well with lifestyle, check A1c HTN: BP today is very good, continue metoprolol, Benicar HCT. High cholesterol: Controlled on rosuvastatin. CAD Saw cardiology 07/04/2020, no changes made.  Asymptomatic Preventive care: Had COVID-vaccine #4 recently, recommend to have a flu shot this fall; POA recommended  RTC 6 months CPX

## 2020-10-18 ENCOUNTER — Encounter: Payer: Self-pay | Admitting: Internal Medicine

## 2020-10-19 ENCOUNTER — Encounter: Payer: Self-pay | Admitting: Allergy & Immunology

## 2020-10-20 ENCOUNTER — Other Ambulatory Visit: Payer: Self-pay | Admitting: Internal Medicine

## 2020-10-20 MED ORDER — PREDNISONE 10 MG PO TABS: ORAL_TABLET | ORAL | 0 refills | Status: DC

## 2020-10-20 MED ORDER — NIRMATRELVIR/RITONAVIR (PAXLOVID)TABLET: 3.0000 | ORAL_TABLET | Freq: Two times a day (BID) | ORAL | 0 refills | Status: AC

## 2020-10-31 DIAGNOSIS — Z129 Encounter for screening for malignant neoplasm, site unspecified: Secondary | ICD-10-CM | POA: Diagnosis not present

## 2020-10-31 DIAGNOSIS — Z85828 Personal history of other malignant neoplasm of skin: Secondary | ICD-10-CM | POA: Diagnosis not present

## 2020-10-31 DIAGNOSIS — L570 Actinic keratosis: Secondary | ICD-10-CM | POA: Diagnosis not present

## 2020-10-31 DIAGNOSIS — L821 Other seborrheic keratosis: Secondary | ICD-10-CM | POA: Diagnosis not present

## 2020-11-13 IMAGING — MR MR LUMBAR SPINE WO/W CM
4 of 7 series · 25 of 48 positions shown · IV contrast (gadavist)
Comparison: 09/26/2017

CLINICAL DATA: Spinal stenosis with neurogenic claudication

EXAM:
MRI LUMBAR SPINE WITHOUT AND WITH CONTRAST
TECHNIQUE: Multiplanar and multiecho pulse sequences of the lumbar spine were
obtained without and with intravenous contrast.
CONTRAST:  10mL GADAVIST GADOBUTROL 1 MMOL/ML IV SOLN

[Series 2: T2 · sagittal · 4.0mm · 0.81mm/px · 5 of 16 slices shown]
[im 1/16]
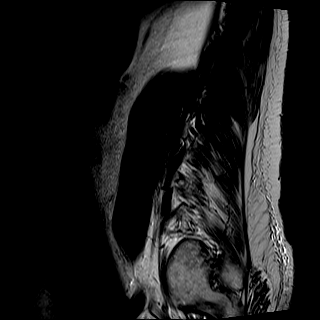
[im 4/16]
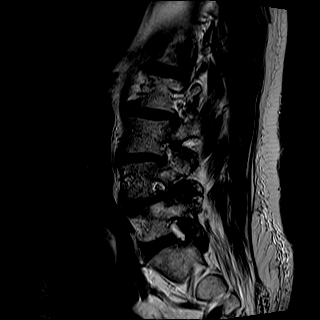
[im 8/16]
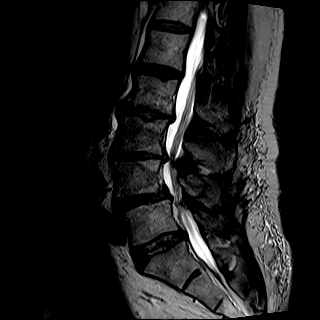
[im 12/16]
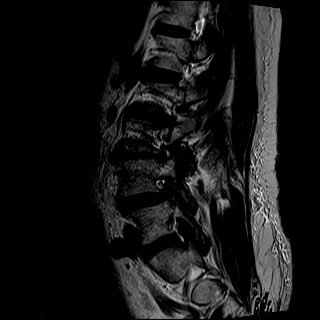
[im 16/16]
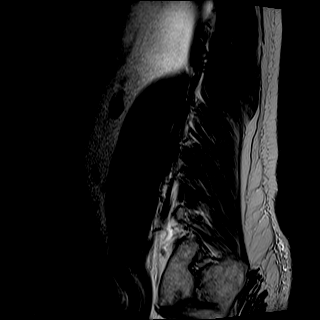

[Series 4: T1 · sagittal · 4.0mm · 0.81mm/px · 4 of 16 slices shown (1 of 2)]
[im 1/16]
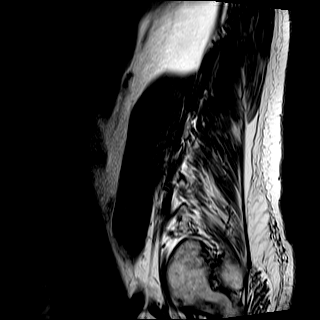
[im 6/16]
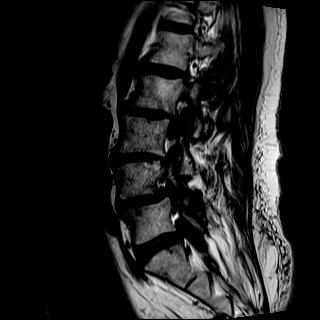
[im 11/16]
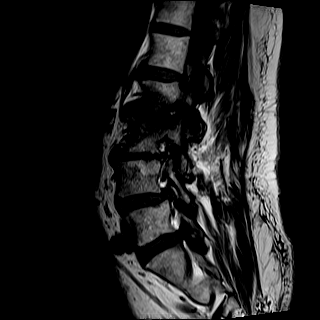
[im 16/16]
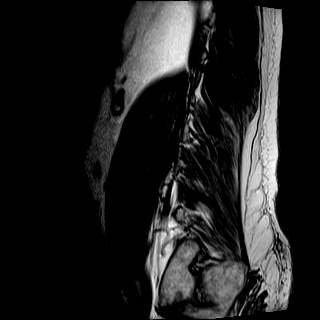

[Series 5: T1 · axial · 4.0mm · 0.39mm/px · z∈[-90,+120]mm · 8 of 36 slices shown (2 of 2)]
[im 1/36]
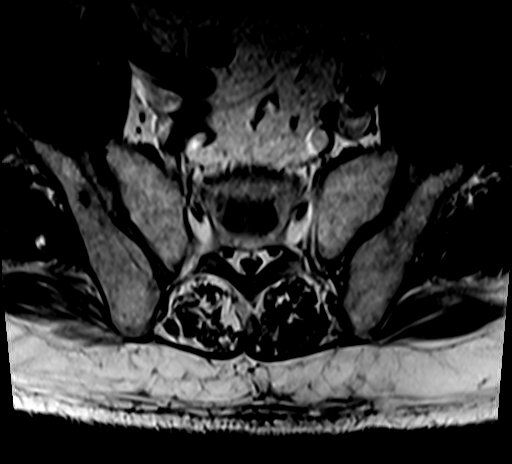
[im 4/36]
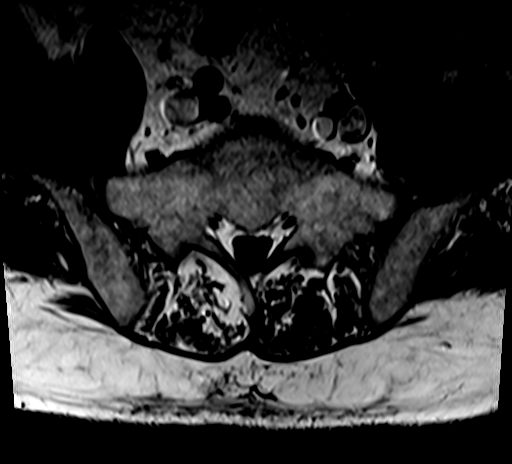
[im 12/36]
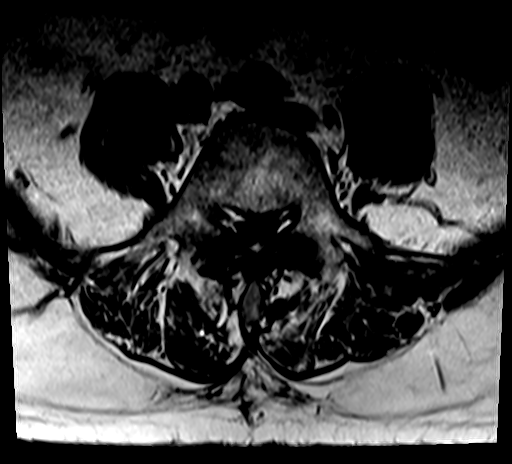
[im 16/36]
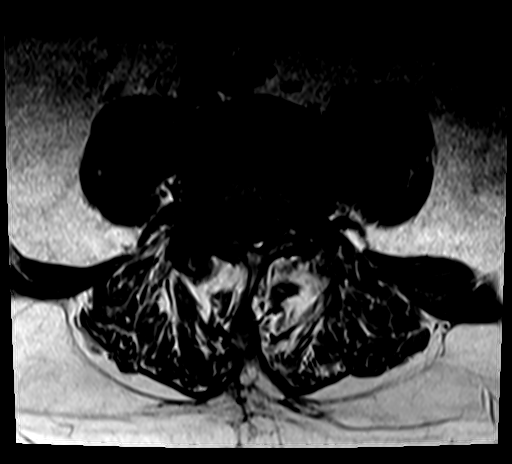
[im 20/36]
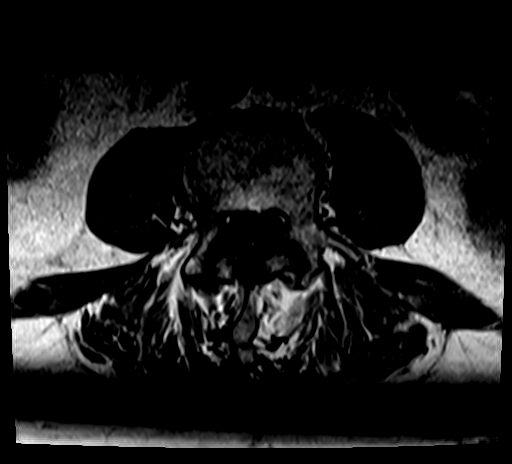
[im 24/36]
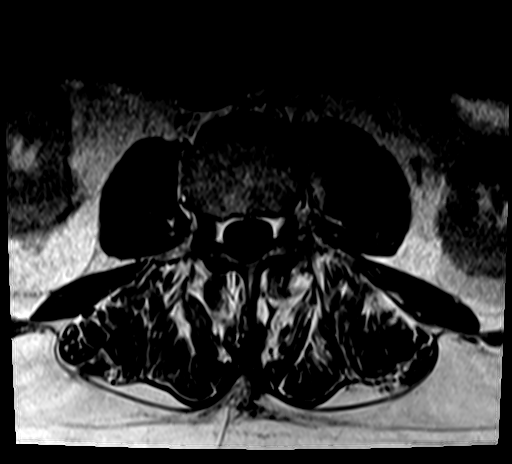
[im 32/36]
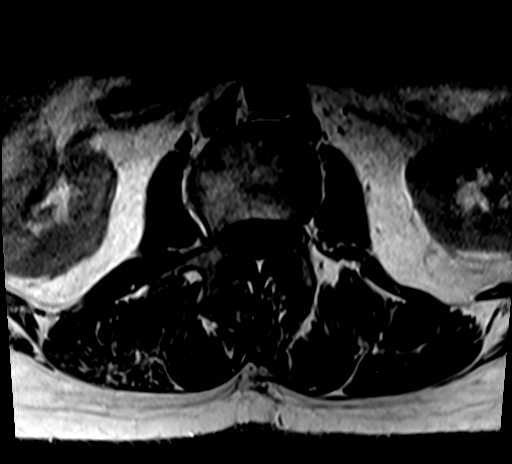
[im 36/36]
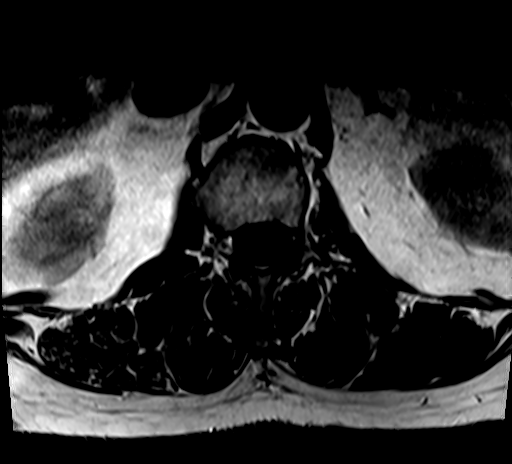

[Series 6: T2 post-contrast · axial · 4.0mm · 0.39mm/px · z∈[-91,+121]mm · 8 of 36 slices shown]
[im 1/36]
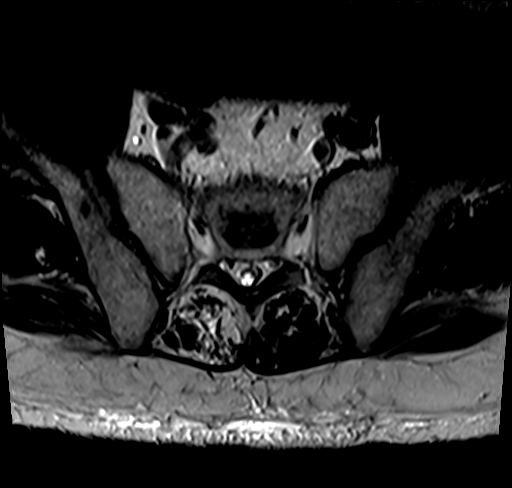
[im 4/36]
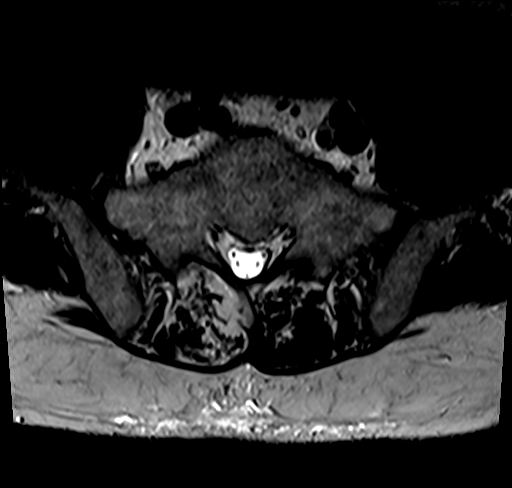
[im 12/36]
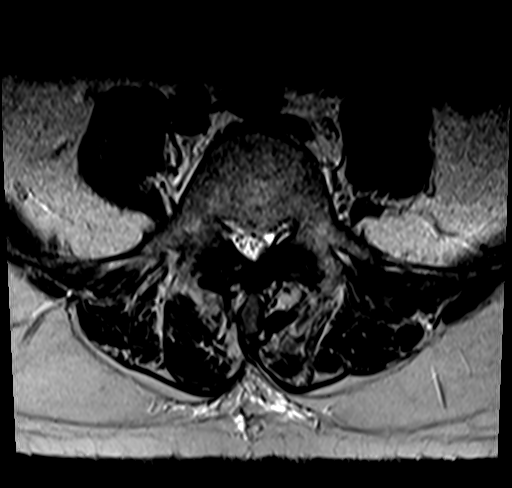
[im 16/36]
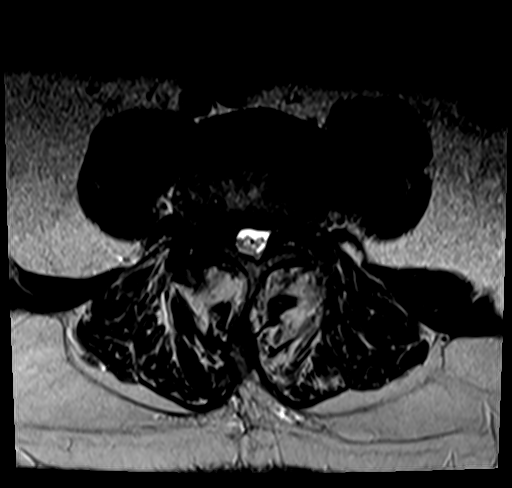
[im 20/36]
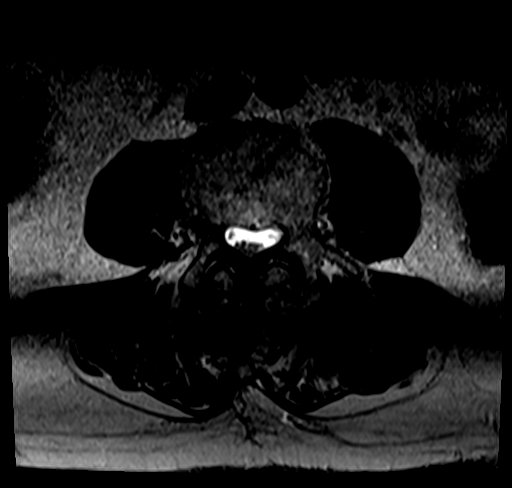
[im 24/36]
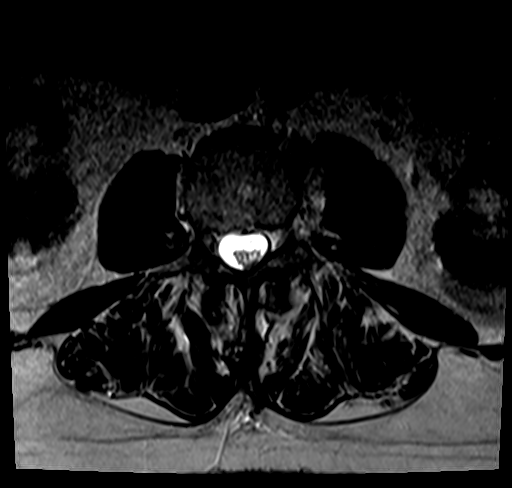
[im 32/36]
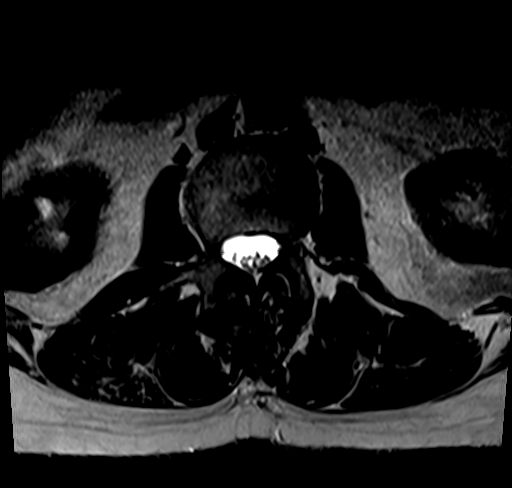
[im 36/36]
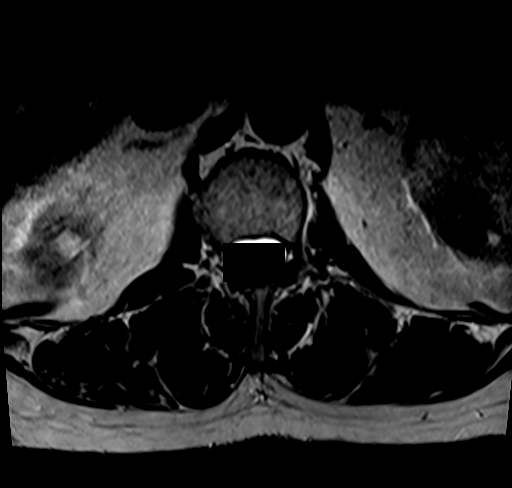

[25 of 48 positions shown; findings below may reference images not displayed]

FINDINGS: Segmentation:  Standard lumbar number

Alignment:  Degenerative grade 1 anterolisthesis at L4-5 and L5-S1.

Vertebrae: Mild discogenic marrow edema at L2-3 and L4-5, new. No
fracture or discitis. No aggressive bone lesion

Conus medullaris and cauda equina: Conus extends to the L1-2 level.
Conus and cauda equina appear normal.

Paraspinal and other soft tissues: No acute finding.

Disc levels:

L1-L2: Mild facet spurring.  No neural impingement

L2-L3: Advanced disc space narrowing with a chronic left paracentral
protrusion extending into the foramen. There has been a left
laminotomy at this level but the subarticular recess remains effaced
and left foraminal stenosis is also high-grade. There is
contributory left facet spurring.

L3-L4: Advanced disc narrowing with endplate spurring and disc
bulging. Degenerative posterior element hypertrophy. High-grade
spinal stenosis. Moderate bilateral foraminal narrowing.

L4-L5: Facet osteoarthritis with spurring and anterolisthesis. The
disc is bulging. Advanced spinal stenosis and right more than left
foraminal impingement that is moderate.

L5-S1:Disc bulging and degenerative facet spurring. Bilateral
subarticular recess stenosis with potential S1 impingement
especially on the right. The foramina are patent.
IMPRESSION: 1. L2-3 interval left-sided laminotomy but a disc protrusion and
facet spurring continues to cause left subarticular recess and
foraminal impingement.
2. L3-4 and L4-5 high-grade spinal stenosis and biforaminal
stenosis.
3. L5-S1 right more than left subarticular recess narrowing.
4. Mild discogenic edema has developed at L2-3 and L4-5.

## 2020-11-29 ENCOUNTER — Other Ambulatory Visit: Payer: Self-pay | Admitting: Internal Medicine

## 2020-12-13 ENCOUNTER — Other Ambulatory Visit: Payer: Self-pay | Admitting: Allergy

## 2021-01-06 ENCOUNTER — Other Ambulatory Visit: Payer: Self-pay | Admitting: Allergy & Immunology

## 2021-01-14 ENCOUNTER — Other Ambulatory Visit: Payer: Self-pay | Admitting: Internal Medicine

## 2021-01-22 DIAGNOSIS — Z6832 Body mass index (BMI) 32.0-32.9, adult: Secondary | ICD-10-CM | POA: Diagnosis not present

## 2021-01-22 DIAGNOSIS — I10 Essential (primary) hypertension: Secondary | ICD-10-CM | POA: Diagnosis not present

## 2021-01-22 DIAGNOSIS — M48062 Spinal stenosis, lumbar region with neurogenic claudication: Secondary | ICD-10-CM | POA: Diagnosis not present

## 2021-01-27 ENCOUNTER — Encounter: Payer: Self-pay | Admitting: Internal Medicine

## 2021-01-28 ENCOUNTER — Other Ambulatory Visit (HOSPITAL_BASED_OUTPATIENT_CLINIC_OR_DEPARTMENT_OTHER): Payer: Self-pay | Admitting: Neurosurgery

## 2021-01-28 DIAGNOSIS — M48062 Spinal stenosis, lumbar region with neurogenic claudication: Secondary | ICD-10-CM

## 2021-02-02 ENCOUNTER — Other Ambulatory Visit: Payer: Self-pay

## 2021-02-02 ENCOUNTER — Ambulatory Visit (HOSPITAL_BASED_OUTPATIENT_CLINIC_OR_DEPARTMENT_OTHER)
Admission: RE | Admit: 2021-02-02 | Discharge: 2021-02-02 | Disposition: A | Discharge status: Home / Self Care | Payer: PPO | Source: Ambulatory Visit | Attending: Neurosurgery | Admitting: Neurosurgery

## 2021-02-02 DIAGNOSIS — M48061 Spinal stenosis, lumbar region without neurogenic claudication: Secondary | ICD-10-CM | POA: Diagnosis not present

## 2021-02-02 DIAGNOSIS — M48062 Spinal stenosis, lumbar region with neurogenic claudication: Secondary | ICD-10-CM | POA: Diagnosis not present

## 2021-02-02 MED ORDER — GADOBUTROL 1 MMOL/ML IV SOLN
10.0000 mL | Freq: Once | INTRAVENOUS | Status: AC | PRN
  Administered 2021-02-02: 10 mL via INTRAVENOUS

## 2021-02-12 ENCOUNTER — Other Ambulatory Visit: Payer: Self-pay | Admitting: Allergy & Immunology

## 2021-02-19 ENCOUNTER — Other Ambulatory Visit: Payer: Self-pay

## 2021-02-19 ENCOUNTER — Ambulatory Visit: Payer: PPO | Admitting: Internal Medicine

## 2021-02-19 ENCOUNTER — Encounter: Payer: Self-pay | Admitting: Internal Medicine

## 2021-02-19 VITALS — BP 120/68 | Ht 70.0 in | Wt 226.0 lb

## 2021-02-19 DIAGNOSIS — J4540 Moderate persistent asthma, uncomplicated: Secondary | ICD-10-CM

## 2021-02-19 DIAGNOSIS — K219 Gastro-esophageal reflux disease without esophagitis: Secondary | ICD-10-CM | POA: Diagnosis not present

## 2021-02-19 DIAGNOSIS — I10 Essential (primary) hypertension: Secondary | ICD-10-CM | POA: Diagnosis not present

## 2021-02-19 DIAGNOSIS — M47816 Spondylosis without myelopathy or radiculopathy, lumbar region: Secondary | ICD-10-CM | POA: Diagnosis not present

## 2021-02-19 DIAGNOSIS — Z6832 Body mass index (BMI) 32.0-32.9, adult: Secondary | ICD-10-CM | POA: Diagnosis not present

## 2021-02-19 DIAGNOSIS — L2089 Other atopic dermatitis: Secondary | ICD-10-CM

## 2021-02-19 DIAGNOSIS — J3089 Other allergic rhinitis: Secondary | ICD-10-CM | POA: Diagnosis not present

## 2021-02-19 DIAGNOSIS — M48062 Spinal stenosis, lumbar region with neurogenic claudication: Secondary | ICD-10-CM | POA: Diagnosis not present

## 2021-02-19 MED ORDER — ALBUTEROL SULFATE HFA 108 (90 BASE) MCG/ACT IN AERS: 2.0000 | INHALATION_SPRAY | RESPIRATORY_TRACT | 1 refills | Status: DC | PRN

## 2021-02-19 MED ORDER — PANTOPRAZOLE SODIUM 40 MG PO TBEC: 40.0000 mg | DELAYED_RELEASE_TABLET | Freq: Two times a day (BID) | ORAL | 3 refills | Status: DC

## 2021-02-19 MED ORDER — BUDESONIDE-FORMOTEROL FUMARATE 160-4.5 MCG/ACT IN AERO: 2.0000 | INHALATION_SPRAY | Freq: Two times a day (BID) | RESPIRATORY_TRACT | 5 refills | Status: DC

## 2021-02-19 MED ORDER — FLUTICASONE PROPIONATE 50 MCG/ACT NA SUSP: NASAL | 5 refills | Status: DC

## 2021-02-19 MED ORDER — MONTELUKAST SODIUM 10 MG PO TABS: 10.0000 mg | ORAL_TABLET | Freq: Every day | ORAL | 3 refills | Status: DC

## 2021-02-19 MED ORDER — AZELASTINE HCL 0.1 % NA SOLN
2.0000 | Freq: Every evening | NASAL | 5 refills | Status: DC | PRN
Start: 2021-02-19 — End: 2022-03-11

## 2021-02-19 NOTE — Patient Instructions (Addendum)
Moderate persistent asthma without complication - Lung function showed inflammation in the Lungs today.  We need to step up your asthma care  - STOP: Flovent inhaler  - START: Symbicort 160mg  2 puffs twice a day  - Use spacer with inhaler, exhale fully before each puff - We will continue with albuterol as needed. - Call us with any issues    2. Allergic rhinitis (dust mites) - Well controlled  - Continue avoidance measures for dust mite  - Start Flonase 27mcg 1 spray per nostril twice a day  - Continue Astelin (azelastine) use 1-2 sprays in each nostril up to three times daily as needed  - Continue montelukast 10mg  daily. - Use your nose sprays as needed as you are doing.    3.Atopic dermatitis - Continue with moisturizing as needed.  - Follow up with Dermatology and continue current skin care regimen    4. Gastroesophageal reflux disease - Continue with Protonix daily.   Follow up in 2 months   Thank you so much for letter me partake in your care today.  Don't hesitate to reach out if you have any additional concerns!  Roney Marion, MD  [facility] [place], [place]

## 2021-02-19 NOTE — Progress Notes (Signed)
FOLLOW UP Date of Service/Encounter:  02/19/21   Subjective:  George Alvarez (DOB: 11-13-1945) is a 75 y.o. male who returns to the [facility] [place] on 02/19/2021 in re-evaluation of the following: asthma and allergic rhinitis  History obtained from: chart review and patient and mother.  For Review, LV was on 06/07/2020  with Dr. Ernst Bowler seen for asthma, allergic rhinitis, GERD and atopic dermatitis    Today is a regular follow up visit.  Asthma:  1-2/week daytime symptoms in past month, 0 nighttime awakenings in past month,  using rescue inhaler 1-2 times in the past week, attributes this to recent URI in household Limitations to daily activity: none 0 ED visit, 0 UC visits 0 hospitalizations since last visit 1 oral steroids (July 2022 - after COVID 19 infection)  and 0 antibiotics for airway illness since last visit. Up-to-date with pneumonia, Covid-19, and Flu vaccines. Smoking exposure: Denies  Today's Asthma Control Test:  .   Current Regimen: Flovent 167mcg 1 puff twice daily  Previous FEV1 % 2.28 at last visit Total corticosteroid use in past year 180mg    Rhinitis: Reports increase in nasal congestion since running out of flonase.  He no longer takes Zyrtec but he has continued Singulair.  He continues environmental precautions with dust mite allergy which slightly does help control rhinitis symptoms.  He continues to use Astelin.  He requests refills for all medications.  Atopic dermatitis: Patient is followed by Dr. Lavone Neri in dermatology.  He cannot recall exact topical medication regimen, history shows prescriptions for triamcinolone 0.1% cream, ketoconazole shampoo, hydrocortisone 2.5% cream.  He is comfortable with current level of eczema control although he does report it still comes and goes.  He had skin testing in 2020 to look for environmental or food triggers and skin testing was only positive to dust mite.   Allergies as of 02/19/2021       Reactions    Nifedipine Dermatitis, Rash   edema        Medication List        Accurate as of February 19, 2021 12:03 PM. If you have any questions, ask your nurse or doctor.          STOP taking these medications    nitroGLYCERIN 0.4 MG SL tablet Commonly known as: NITROSTAT Stopped by: Roney Marion, MD   predniSONE 10 MG tablet Commonly known as: DELTASONE Stopped by: Roney Marion, MD       TAKE these medications    albuterol 108 (90 Base) MCG/ACT inhaler Commonly known as: VENTOLIN HFA Inhale 2 puffs into the lungs every 4 (four) hours as needed for wheezing or shortness of breath.   aspirin EC 81 MG tablet Take 1 tablet (81 mg total) by mouth daily.   azelastine 0.1 % nasal spray Commonly known as: ASTELIN Place 2 sprays into both nostrils at bedtime as needed for rhinitis or allergies.   budesonide-formoterol 160-4.5 MCG/ACT inhaler Commonly known as: Symbicort Inhale 2 puffs into the lungs 2 (two) times daily. Started by: Roney Marion, MD   cetirizine 10 MG tablet Commonly known as: ZYRTEC Take 10 mg by mouth daily.   Fish Oil 1200 MG Caps Take 1,200 mg by mouth daily.   fluticasone 110 MCG/ACT inhaler Commonly known as: FLOVENT HFA Inhale 1 puff into the lungs in the morning and at bedtime. Rinse mouth after each use.   fluticasone 50 MCG/ACT nasal spray Commonly known as: FLONASE One spray each nostril twice a day  if needed for stuffy nose.   hydrocortisone 2.5 % cream Apply 1 application topically daily as needed (Eczema).   ketoconazole 2 % shampoo Commonly known as: NIZORAL Apply 1 application topically once a week.   metoprolol tartrate 25 MG tablet Commonly known as: LOPRESSOR TAKE 0.5 TABLETS BY MOUTH 2 TIMES DAILY.   montelukast 10 MG tablet Commonly known as: SINGULAIR Take 1 tablet (10 mg total) by mouth at bedtime. What changed: See the new instructions. Changed by: Roney Marion, MD   multivitamin with minerals Tabs  tablet Take 1 tablet by mouth daily.   olmesartan-hydrochlorothiazide 20-12.5 MG tablet Commonly known as: BENICAR HCT TAKE 1 TABLET BY MOUTH EVERY DAY   pantoprazole 40 MG tablet Commonly known as: PROTONIX Take 1 tablet (40 mg total) by mouth 2 (two) times daily.   rosuvastatin 40 MG tablet Commonly known as: CRESTOR TAKE 1 TABLET BY MOUTH EVERY DAY   tadalafil 5 MG tablet Commonly known as: CIALIS Take 5 mg by mouth daily as needed.   tamsulosin 0.4 MG Caps capsule Commonly known as: FLOMAX Take 1 capsule (0.4 mg total) by mouth daily.   triamcinolone cream 0.1 % Commonly known as: KENALOG Apply 1 application topically daily as needed (Eczema).       Past Medical History:  Diagnosis Date   Allergy    seasonal   Anal fissure    Arthritis    Asthma, chronic 06/16/2007   Qualifier: Diagnosis of  By: Linna Darner MD, Gwyndolyn Saxon   Onset:as child Triggers (environmental, infectious, allergic): all, mainly environmental triggers Rescue inhaler WJX:BJYNWG Maintenance medications/ response:Singulair,Qvar Smoking history:never Family history pulmonary disease: no     Basal cell carcinoma of chest wall 2016   1.8 cm, treated with electrodessication and currettage   Benign paroxysmal positional vertigo 11/19/2012   Diagnosed at [facility], [place]  Physical therapy appointment pending    BPH (benign prostatic hyperplasia)    With urinary obstruction   Coronary artery disease    a. 02/2012 Cath/PCI: LM 10, LAD min irregs, LCX large, OM1 sm, 40 ost, OM2 95p (5.0x16 Veriflex & 4.5x12 Veriflex BMS'), RCA 30p, 43m, 30d, PDA/PLA min irregs, EF 65%   Diverticulosis    Erectile dysfunction due to arterial insufficiency    Fundic gland polyps of stomach, benign 2010   GERD (gastroesophageal reflux disease)    History of elevated PSA    History of kidney stones    Hyperlipidemia    Hypertension    Nocturia    Perennial allergic rhinitis    Peyronie's disease    Skin cancer    Basal  and squamous cell cancers, greater than 20   Stroke ([place]) 2016   Tubular adenoma of colon 2010   Past Surgical History:  Procedure Laterality Date   COLONOSCOPY  2016   COLONOSCOPY  06/2020   CORONARY ANGIOPLASTY WITH STENT PLACEMENT  02/06/2012   OM2  bare metal    EPIDURAL BLOCK INJECTION  04-2015   Dr Brien Few   epidural steroids  2007, 2009   X 2 @ cervical &, lumbar)   HERNIA REPAIR     INTRAVASCULAR ULTRASOUND  02/06/2012   Procedure: INTRAVASCULAR ULTRASOUND;  Surgeon: [name], MD;  Location: [facility] CATH LAB;  Service: Cardiovascular;;   LEFT HEART CATH AND CORONARY ANGIOGRAPHY N/A 09/30/2019   Procedure: LEFT HEART CATH AND CORONARY ANGIOGRAPHY;  Surgeon: [name], MD;  Location: [place] CV LAB;  Service: Cardiovascular;  Laterality: N/A;   LEFT  HEART CATHETERIZATION WITH CORONARY ANGIOGRAM N/A 02/06/2012   Procedure: LEFT HEART CATHETERIZATION WITH CORONARY ANGIOGRAM;  Surgeon: [name], MD;  Location: [facility] CATH LAB;  Service: Cardiovascular;  Laterality: N/A;   LUMBAR LAMINECTOMY/DECOMPRESSION MICRODISCECTOMY N/A 10/21/2017   Procedure: Lumbar Two-Three, Lumbar Four-Five Discectomy;  Surgeon: Ashok Pall, MD;  Location: [place];  Service: Neurosurgery;  Laterality: N/A;   PERCUTANEOUS CORONARY STENT INTERVENTION (PCI-S)  02/06/2012   Procedure: PERCUTANEOUS CORONARY STENT INTERVENTION (PCI-S);  Surgeon: [name], MD;  Location: [facility] CATH LAB;  Service: Cardiovascular;;   septoplasty     TONSILLECTOMY AND ADENOIDECTOMY     TRIGGER FINGER RELEASE     UPPER GASTROINTESTINAL ENDOSCOPY  2011   gastric polyps   VASECTOMY     Otherwise, there have been no changes to his past medical history, surgical history, family history, or social history.  ROS: All others negative except as noted per HPI.   Objective:  BP 120/68   Ht 5\' 10"  (1.778 m)   Wt 226 lb (102.5 kg)   SpO2 99%   BMI 32.43 kg/m  Body mass index is 32.43  kg/m. Physical Exam: General Appearance:  Alert, cooperative, no distress, appears stated age  Head:  Normocephalic, without obvious abnormality, atraumatic  HEENT  Conjunctiva clear, EOM's intact, TM- Could not visualize due to cerumen , nasal mucosa pale + Clear rhinnorhea    Nasal polyposis not noted on limited external exam   Throat: Lips, tongue normal; teeth and gums normal, oral mucosa normal without exudates, posterior pharyngeal cobblestoning not noted  Neck: Supple, symmetrical  Lungs:   Respirations unlabored, no coughing, Breath Sounds bilaterally, no wheeze, crackles or rales  Heart:  Appears well perfused, S1 S2 normal, no murmurs, rubs or gallops, regular rate or rhythm  Extremities: No edema  Skin: Skin color, texture, turgor normal, no rashes or lesions on visualized portions of skin  Neurologic: No gross deficits   Reviewed: Previous allergy encounters, testing, PFTS, allergy pertinent laboratory and radiographic data   I reviewed his past medical history, social history, family history, and environmental history and no significant changes have been reported from his previous visit.  Spirometry:  Tracings reviewed. His effort: Good reproducible efforts. FVC: 3.48L FEV1: 2.03L, 65% predicted FEV1/FVC ratio: 58% Interpretation: Spirometry consistent with moderate obstructive disease.  Please see scanned spirometry results for details.   Assessment:  Moderate persistent asthma without complication - Plan: Spirometry with Graph  Other allergic rhinitis  Other atopic dermatitis  Gastroesophageal reflux disease without esophagitis  Plan/Recommendations:   Patient Instructions  Moderate persistent asthma without complication - Lung function showed inflammation in the Lungs today.  We need to step up your asthma care  - STOP: Flovent inhaler  - START: Symbicort 160mg  2 puffs twice a day  - Use spacer with inhaler, exhale fully before each puff - We will continue  with albuterol as needed. - Call us with any issues    2. Allergic rhinitis (dust mites) - Well controlled  - Continue avoidance measures for dust mite  - Start Flonase 63mcg 1 spray per nostril twice a day  - Continue Astelin (azelastine) use 1-2 sprays in each nostril up to three times daily as needed  - Continue montelukast 10mg  daily. - Use your nose sprays as needed as you are doing.    3.Atopic dermatitis - Continue with moisturizing as needed.  - Follow up with Dermatology and continue current skin care regimen    4. Gastroesophageal reflux disease -  Continue with Protonix daily.   Follow up in 2 months   Thank you so much for letter me partake in your care today.  Don't hesitate to reach out if you have any additional concerns!  Roney Marion, MD  [facility] [place], [place]

## 2021-03-06 DIAGNOSIS — M47816 Spondylosis without myelopathy or radiculopathy, lumbar region: Secondary | ICD-10-CM | POA: Diagnosis not present

## 2021-03-07 ENCOUNTER — Telehealth: Payer: PPO

## 2021-03-15 DIAGNOSIS — R972 Elevated prostate specific antigen [PSA]: Secondary | ICD-10-CM | POA: Diagnosis not present

## 2021-03-15 DIAGNOSIS — N401 Enlarged prostate with lower urinary tract symptoms: Secondary | ICD-10-CM | POA: Diagnosis not present

## 2021-03-15 LAB — PSA: PSA: 2.09

## 2021-03-25 DIAGNOSIS — R3912 Poor urinary stream: Secondary | ICD-10-CM | POA: Diagnosis not present

## 2021-03-25 DIAGNOSIS — R350 Frequency of micturition: Secondary | ICD-10-CM | POA: Diagnosis not present

## 2021-03-25 DIAGNOSIS — N5201 Erectile dysfunction due to arterial insufficiency: Secondary | ICD-10-CM | POA: Diagnosis not present

## 2021-03-25 DIAGNOSIS — R972 Elevated prostate specific antigen [PSA]: Secondary | ICD-10-CM | POA: Diagnosis not present

## 2021-03-25 DIAGNOSIS — N401 Enlarged prostate with lower urinary tract symptoms: Secondary | ICD-10-CM | POA: Diagnosis not present

## 2021-03-26 ENCOUNTER — Encounter: Payer: Self-pay | Admitting: Internal Medicine

## 2021-04-03 ENCOUNTER — Encounter: Payer: Self-pay | Admitting: Internal Medicine

## 2021-04-03 ENCOUNTER — Ambulatory Visit (INDEPENDENT_AMBULATORY_CARE_PROVIDER_SITE_OTHER): Payer: PPO | Admitting: Internal Medicine

## 2021-04-03 VITALS — BP 132/78 | HR 68 | Temp 98.2°F | Resp 18 | Ht 70.0 in | Wt 229.5 lb

## 2021-04-03 DIAGNOSIS — Z23 Encounter for immunization: Secondary | ICD-10-CM | POA: Diagnosis not present

## 2021-04-03 DIAGNOSIS — E785 Hyperlipidemia, unspecified: Secondary | ICD-10-CM | POA: Diagnosis not present

## 2021-04-03 DIAGNOSIS — I10 Essential (primary) hypertension: Secondary | ICD-10-CM

## 2021-04-03 DIAGNOSIS — Z0000 Encounter for general adult medical examination without abnormal findings: Secondary | ICD-10-CM

## 2021-04-03 DIAGNOSIS — E119 Type 2 diabetes mellitus without complications: Secondary | ICD-10-CM

## 2021-04-03 LAB — COMPREHENSIVE METABOLIC PANEL
ALT: 25 U/L (ref 0–53)
AST: 27 U/L (ref 0–37)
Albumin: 4.3 g/dL (ref 3.5–5.2)
Alkaline Phosphatase: 50 U/L (ref 39–117)
BUN: 19 mg/dL (ref 6–23)
CO2: 26 mEq/L (ref 19–32)
Calcium: 9.4 mg/dL (ref 8.4–10.5)
Chloride: 105 mEq/L (ref 96–112)
Creatinine, Ser: 1.17 mg/dL (ref 0.40–1.50)
GFR: 62.02 mL/min (ref >60.00)
Glucose, Bld: 126 mg/dL — ABNORMAL HIGH (ref 70–99)
Potassium: 4.2 mEq/L (ref 3.5–5.1)
Sodium: 139 mEq/L (ref 135–145)
Total Bilirubin: 0.7 mg/dL (ref 0.2–1.2)
Total Protein: 6.3 g/dL (ref 6.0–8.3)

## 2021-04-03 LAB — CBC WITH DIFFERENTIAL/PLATELET
Basophils Absolute: 0 10*3/uL (ref 0.0–0.1)
Basophils Relative: 0.7 % (ref 0.0–3.0)
Eosinophils Absolute: 0.3 10*3/uL (ref 0.0–0.7)
Eosinophils Relative: 4.4 % (ref 0.0–5.0)
HCT: 42.5 % (ref 39.0–52.0)
Hemoglobin: 14.4 g/dL (ref 13.0–17.0)
Lymphocytes Relative: 31 % (ref 12.0–46.0)
Lymphs Abs: 1.8 10*3/uL (ref 0.7–4.0)
MCHC: 33.8 g/dL (ref 30.0–36.0)
MCV: 89.6 fl (ref 78.0–100.0)
Monocytes Absolute: 0.6 10*3/uL (ref 0.1–1.0)
Monocytes Relative: 9.5 % (ref 3.0–12.0)
Neutro Abs: 3.2 10*3/uL (ref 1.4–7.7)
Neutrophils Relative %: 54.4 % (ref 43.0–77.0)
Platelets: 131 10*3/uL — ABNORMAL LOW (ref 150.0–400.0)
RBC: 4.75 Mil/uL (ref 4.22–5.81)
RDW: 13.1 % (ref 11.5–15.5)
WBC: 5.9 10*3/uL (ref 4.0–10.5)

## 2021-04-03 LAB — LIPID PANEL
Cholesterol: 117 mg/dL (ref 0–200)
HDL: 34.3 mg/dL — ABNORMAL LOW (ref >39.00)
NonHDL: 82.29
Total CHOL/HDL Ratio: 3
Triglycerides: 252 mg/dL — ABNORMAL HIGH (ref 0.0–149.0)
VLDL: 50.4 mg/dL — ABNORMAL HIGH (ref 0.0–40.0)

## 2021-04-03 LAB — HEMOGLOBIN A1C: Hgb A1c MFr Bld: 6.5 % (ref 4.6–6.5)

## 2021-04-03 LAB — LDL CHOLESTEROL, DIRECT: Direct LDL: 59 mg/dL

## 2021-04-03 NOTE — Assessment & Plan Note (Signed)
Here for CPX DM: Last A1c was 6.5, he has diabetes, diet controlled, recheck a A1c.  Diet discussed HTN: Well-controlled, continue metoprolol, Benicar HCT. Hyperlipidemia: On Crestor, checking labs Asthma: Sees a specialist, maintenance meds were upgraded to Symbicort, also has a rescue inhaler. Spinal stenosis: Back pain resurfaced, saw neurosurgery, MRI showed spinal stenosis, had 1 local injection, will have a second one soon.  Unable to exercise as much. Occasionally has difficulty coordinating his hands.  Denies neck pain, no stroke symptoms.  Symptoms are very subtle, we agreed on observation. Stress: Not feeling too well emotionally due to chronic pain and also had to place her mother in a nursing home.  Patient is counseled.  No anxiety or depression per se, just  feels occasionally  down, he felt better after we talk. CAD, history of stroke: Controlling CV RF. Skin cancer: Sees Derm regularly RTC 6 months

## 2021-04-03 NOTE — Patient Instructions (Signed)
Per our records you are due for your diabetic eye exam. Please contact your eye doctor to schedule an appointment. Please have them send copies of your office visit notes to us. Our fax number is [phone]. If you need a referral to an eye doctor please let us know.  

## 2021-04-03 NOTE — Progress Notes (Signed)
Subjective:    Patient ID: George Alvarez, male    DOB: 1945-06-09, 75 y.o.   MRN: [id number]  DOS:  04/03/2021 Type of visit - description: CPX  Here for CPX. His main concern today is low back pain, follow-up by neurosurgery. Also he had to place his mother in a nursing home >> emotionally difficult but not depressed per se.  Review of Systems  Other than above, a 14 point review of systems is negative     Past Medical History:  Diagnosis Date   Allergy    seasonal   Anal fissure    Arthritis    Asthma, chronic 06/16/2007   Qualifier: Diagnosis of  By: Linna Darner MD, Gwyndolyn Saxon   Onset:as child Triggers (environmental, infectious, allergic): all, mainly environmental triggers Rescue inhaler IBB:CWUGQB Maintenance medications/ response:Singulair,Qvar Smoking history:never Family history pulmonary disease: no     Basal cell carcinoma of chest wall 2016   1.8 cm, treated with electrodessication and currettage   Benign paroxysmal positional vertigo 11/19/2012   Diagnosed at [facility], [place]  Physical therapy appointment pending    BPH (benign prostatic hyperplasia)    With urinary obstruction   Coronary artery disease    a. 02/2012 Cath/PCI: LM 10, LAD min irregs, LCX large, OM1 sm, 40 ost, OM2 95p (5.0x16 Veriflex & 4.5x12 Veriflex BMS'), RCA 30p, 22m, 30d, PDA/PLA min irregs, EF 65%   Diabetes mellitus without complication ([place])    Diverticulosis    Erectile dysfunction due to arterial insufficiency    Fundic gland polyps of stomach, benign 2010   GERD (gastroesophageal reflux disease)    History of elevated PSA    History of kidney stones    Hyperlipidemia    Hypertension    Nocturia    Perennial allergic rhinitis    Peyronie's disease    Skin cancer    Basal and squamous cell cancers, greater than 20   Stroke ([place]) 2016   Tubular adenoma of colon 2010    Past Surgical History:  Procedure Laterality Date   COLONOSCOPY  2016   COLONOSCOPY  06/2020    CORONARY ANGIOPLASTY WITH STENT PLACEMENT  02/06/2012   OM2  bare metal    EPIDURAL BLOCK INJECTION  04-2015   Dr Brien Few   epidural steroids  2007, 2009   X 2 @ cervical &, lumbar)   HERNIA REPAIR     INTRAVASCULAR ULTRASOUND  02/06/2012   Procedure: INTRAVASCULAR ULTRASOUND;  Surgeon: [name], MD;  Location: [facility] CATH LAB;  Service: Cardiovascular;;   LEFT HEART CATH AND CORONARY ANGIOGRAPHY N/A 09/30/2019   Procedure: LEFT HEART CATH AND CORONARY ANGIOGRAPHY;  Surgeon: [name], MD;  Location: [facility] CV LAB;  Service: Cardiovascular;  Laterality: N/A;   LEFT HEART CATHETERIZATION WITH CORONARY ANGIOGRAM N/A 02/06/2012   Procedure: LEFT HEART CATHETERIZATION WITH CORONARY ANGIOGRAM;  Surgeon: [name], MD;  Location: [facility] CATH LAB;  Service: Cardiovascular;  Laterality: N/A;   LUMBAR LAMINECTOMY/DECOMPRESSION MICRODISCECTOMY N/A 10/21/2017   Procedure: Lumbar Two-Three, Lumbar Four-Five Discectomy;  Surgeon: Ashok Pall, MD;  Location: [place] [facility];  Service: Neurosurgery;  Laterality: N/A;   PERCUTANEOUS CORONARY STENT INTERVENTION (PCI-S)  02/06/2012   Procedure: PERCUTANEOUS CORONARY STENT INTERVENTION (PCI-S);  Surgeon: [name], MD;  Location: [facility] CATH LAB;  Service: Cardiovascular;;   septoplasty     TONSILLECTOMY AND ADENOIDECTOMY     TRIGGER FINGER RELEASE     UPPER GASTROINTESTINAL ENDOSCOPY  2011   gastric polyps  VASECTOMY     Social History   Socioeconomic History   Marital status: Married    Spouse name: Not on file   Number of children: 3   Years of education: Not on file   Highest education level: Not on file  Occupational History   Occupation: Higher education careers adviser   Tobacco Use   Smoking status: Never   Smokeless tobacco: Never  Vaping Use   Vaping Use: Never used  Substance and Sexual Activity   Alcohol use: Yes    Alcohol/week: 0.0 standard drinks    Comment: Wine occasionally   Drug use: No    Sexual activity: Yes    Partners: Female  Other Topics Concern   Not on file  Social History Narrative   3 daughters, 75 g-children   lifes w/ wife in [place]   Right handed   Two story home   Social Determinants of Health   Financial Resource Strain: Not on file  Food Insecurity: Not on file  Transportation Needs: No Transportation Needs   Lack of Transportation (Medical): No   Lack of Transportation (Non-Medical): No  Physical Activity: Sufficiently Active   Days of Exercise per Week: 3 days   Minutes of Exercise per Session: 60 min  Stress: Not on file  Social Connections: Not on file  Intimate Partner Violence: Not on file     Allergies as of 04/03/2021       Reactions   Nifedipine Dermatitis, Rash   edema        Medication List        Accurate as of April 03, 2021  3:29 PM. If you have any questions, ask your nurse or doctor.          STOP taking these medications    fluticasone 110 MCG/ACT inhaler Commonly known as: FLOVENT HFA Stopped by: Kathlene November, MD       TAKE these medications    albuterol 108 (90 Base) MCG/ACT inhaler Commonly known as: VENTOLIN HFA Inhale 2 puffs into the lungs every 4 (four) hours as needed for wheezing or shortness of breath.   aspirin EC 81 MG tablet Take 1 tablet (81 mg total) by mouth daily.   azelastine 0.1 % nasal spray Commonly known as: ASTELIN Place 2 sprays into both nostrils at bedtime as needed for rhinitis or allergies.   budesonide-formoterol 160-4.5 MCG/ACT inhaler Commonly known as: Symbicort Inhale 2 puffs into the lungs 2 (two) times daily.   cetirizine 10 MG tablet Commonly known as: ZYRTEC Take 10 mg by mouth daily.   Fish Oil 1200 MG Caps Take 1,200 mg by mouth daily.   fluticasone 50 MCG/ACT nasal spray Commonly known as: FLONASE One spray each nostril twice a day if needed for stuffy nose.   hydrocortisone 2.5 % cream Apply 1 application topically daily as needed (Eczema).    ketoconazole 2 % shampoo Commonly known as: NIZORAL Apply 1 application topically once a week.   metoprolol tartrate 25 MG tablet Commonly known as: LOPRESSOR TAKE 0.5 TABLETS BY MOUTH 2 TIMES DAILY.   montelukast 10 MG tablet Commonly known as: SINGULAIR Take 1 tablet (10 mg total) by mouth at bedtime.   multivitamin with minerals Tabs tablet Take 1 tablet by mouth daily.   olmesartan-hydrochlorothiazide 20-12.5 MG tablet Commonly known as: BENICAR HCT TAKE 1 TABLET BY MOUTH EVERY DAY   pantoprazole 40 MG tablet Commonly known as: PROTONIX Take 1 tablet (40 mg total) by mouth 2 (two) times daily.  rosuvastatin 40 MG tablet Commonly known as: CRESTOR TAKE 1 TABLET BY MOUTH EVERY DAY   tadalafil 5 MG tablet Commonly known as: CIALIS Take 5 mg by mouth daily as needed.   tamsulosin 0.4 MG Caps capsule Commonly known as: FLOMAX Take 1 capsule (0.4 mg total) by mouth daily.   triamcinolone cream 0.1 % Commonly known as: KENALOG Apply 1 application topically daily as needed (Eczema).           Objective:   Physical Exam BP 132/78 (BP Location: Left Arm, Patient Position: Sitting, Cuff Size: Normal)    Pulse 68    Temp 98.2 F (36.8 C) (Oral)    Resp 18    Ht 5\' 10"  (1.778 m)    Wt 229 lb 8 oz (104.1 kg)    SpO2 96%    BMI 32.93 kg/m  General: Well developed, NAD, BMI noted Neck: No  thyromegaly  HEENT:  Normocephalic . Face symmetric, atraumatic Lungs:  CTA B Normal respiratory effort, no intercostal retractions, no accessory muscle use. Heart: RRR,  no murmur.  Abdomen:  Not distended, soft, non-tender. No rebound or rigidity.   Lower extremities: no pretibial edema bilaterally  Skin: Exposed areas without rash. Not pale. Not jaundice Neurologic:  alert & oriented X3.  Speech normal, gait: Uses a cane, transferring well without assistance. Strength symmetric and appropriate for age.  Psych: Cognition and judgment appear intact.  Cooperative with  normal attention span and concentration.  Behavior appropriate. No anxious or depressed appearing.     Assessment    Assessment  DM (A1C 6.09 September 2020) HTN Hyperlipidemia CV: Dr Angelena Form --CAD --Stroke, seen in a MRI Asthma- per allergist  MSK --DJD knees used to see Dr Len Childs 2016, local injections --Spinal stenosis (neck-back) - Dr Cyndy Freeze dx ~ 2008 via MRI neck, back, had local injections 2008; then 04-2015 GERD BPV, off balance (seen by neuro 2020)  GU: --Elevated PSA -- Dr Jeffie Pollock --Peyronie's  Disease Skin cancer : [facility] Dr Susie Cassette   PLAN: Here for CPX DM: Last A1c was 6.5, he has diabetes, diet controlled, recheck a A1c.  Diet discussed HTN: Well-controlled, continue metoprolol, Benicar HCT. Hyperlipidemia: On Crestor, checking labs Asthma: Sees a specialist, maintenance meds were upgraded to Symbicort, also has a rescue inhaler. Spinal stenosis: Back pain resurfaced, saw neurosurgery, MRI showed spinal stenosis, had 1 local injection, will have a second one soon.  Unable to exercise as much. Occasionally has difficulty coordinating his hands.  Denies neck pain, no stroke symptoms.  Symptoms are very subtle, we agreed on observation. Stress: Not feeling too well emotionally due to chronic pain and also had to place her mother in a nursing home.  Patient is counseled.  No anxiety or depression per se, just  feels occasionally  down, he felt better after we talk. CAD, history of stroke: Controlling CV RF. Skin cancer: Sees Derm regularly RTC 6 months    This visit occurred during the SARS-CoV-2 public health emergency.  Safety protocols were in place, including screening questions prior to the visit, additional usage of staff PPE, and extensive cleaning of exam room while observing appropriate contact time as indicated for disinfecting solutions.

## 2021-04-03 NOTE — Assessment & Plan Note (Signed)
-  Td 2019 - Pneumonia shot 2013  - Prevnar 2015 - PNM 20: Today - zostavax 2013 - s/p shingrex -UTD on COVID vaccines - had a flu shot   - Prostate Ca screening: Saw urology last week for chronic LUTS, elevated PSA -Colonoscopy in 2010, 08-2014,  C-scope 05/30/2020, next per GI - Labs: CMP, FLP, CBC, A1c -ACP: Documents on file

## 2021-04-03 NOTE — Patient Instructions (Signed)
° ° °  GO TO THE LAB : Get the blood work     GO TO THE FRONT DESK, PLEASE SCHEDULE YOUR APPOINTMENTS Come back for a checkup in 6 months 

## 2021-04-08 ENCOUNTER — Encounter: Payer: Self-pay | Admitting: Internal Medicine

## 2021-04-09 DIAGNOSIS — J069 Acute upper respiratory infection, unspecified: Secondary | ICD-10-CM | POA: Diagnosis not present

## 2021-04-09 DIAGNOSIS — R21 Rash and other nonspecific skin eruption: Secondary | ICD-10-CM | POA: Diagnosis not present

## 2021-04-09 DIAGNOSIS — Z20822 Contact with and (suspected) exposure to covid-19: Secondary | ICD-10-CM | POA: Diagnosis not present

## 2021-04-09 DIAGNOSIS — J45901 Unspecified asthma with (acute) exacerbation: Secondary | ICD-10-CM | POA: Diagnosis not present

## 2021-04-09 DIAGNOSIS — L299 Pruritus, unspecified: Secondary | ICD-10-CM | POA: Diagnosis not present

## 2021-04-16 DIAGNOSIS — L2089 Other atopic dermatitis: Secondary | ICD-10-CM | POA: Diagnosis not present

## 2021-04-29 ENCOUNTER — Ambulatory Visit: Payer: PPO | Admitting: Internal Medicine

## 2021-05-06 DIAGNOSIS — Z129 Encounter for screening for malignant neoplasm, site unspecified: Secondary | ICD-10-CM | POA: Diagnosis not present

## 2021-05-06 DIAGNOSIS — D485 Neoplasm of uncertain behavior of skin: Secondary | ICD-10-CM | POA: Diagnosis not present

## 2021-05-06 DIAGNOSIS — Z85828 Personal history of other malignant neoplasm of skin: Secondary | ICD-10-CM | POA: Diagnosis not present

## 2021-05-06 DIAGNOSIS — L570 Actinic keratosis: Secondary | ICD-10-CM | POA: Diagnosis not present

## 2021-05-06 DIAGNOSIS — L2089 Other atopic dermatitis: Secondary | ICD-10-CM | POA: Diagnosis not present

## 2021-05-06 DIAGNOSIS — D0439 Carcinoma in situ of skin of other parts of face: Secondary | ICD-10-CM | POA: Diagnosis not present

## 2021-05-06 DIAGNOSIS — L821 Other seborrheic keratosis: Secondary | ICD-10-CM | POA: Diagnosis not present

## 2021-05-10 DIAGNOSIS — M47816 Spondylosis without myelopathy or radiculopathy, lumbar region: Secondary | ICD-10-CM | POA: Diagnosis not present

## 2021-05-13 NOTE — Patient Instructions (Addendum)
Moderate persistent asthma  -  - Lung function showed mild inflammation in your lung, probably due to recent exacerbation which you have now recovered from.  We will continue current treatment plan and step up if your symptoms get worse  - Continue Symbicort 160mg  2 puffs twice a day  - Continue Montelukast 10mg  daily  - Use spacer with inhaler, exhale fully before each puff - We will continue with albuterol as needed. - Call us with any issues    2. Allergic rhinitis (dust mites) - not well controlled  Skin test positive to Tree, [employer], DM  Skin test negative to grass, cat, dog, roach, horse, mouse, tobacco leaf  - Continue avoidance measures for dust mite, molds, trees, weed  - Continue  Flonase 63mcg 1 spray per nostril twice a day  - Continue Astelin (azelastine) use 1-2 sprays in each nostril up to three times daily as needed  - Continue montelukast 10mg  daily. - Use your nose sprays as needed as you are doing.  Start allergy injections. Had a detailed discussion with patient/family that clinical history is suggestive of allergic rhinitis, and may benefit from allergy immunotherapy (AIT). Discussed in detail regarding the dosing, schedule, side effects (mild to moderate local allergic reaction and rarely systemic allergic reactions including anaphylaxis/death), alternatives and benefits (significant improvement in nasal symptoms, seasonal flares of asthma) of immunotherapy with the patient. There is significant time commitment involved with allergy shots, which includes weekly immunotherapy injections for first 9-12 months and then biweekly to monthly injections for 3-5 years. Clinical response is often delayed and patient may not see an improvement for 6-12 months. Consent was signed. I have prescribed epinephrine injectable and demonstrated proper use. For mild symptoms you can take over the counter antihistamines such as Benadryl and monitor symptoms closely. If symptoms worsen or if  you have severe symptoms including breathing issues, throat closure, significant swelling, whole body hives, severe diarrhea and vomiting, lightheadedness then inject epinephrine and seek immediate medical care afterwards. Action plan given.    3.Atopic dermatitis - Continue with moisturizing as needed.  - Follow up with Dermatology and continue current skin care regimen  - Plan to start Allergy Injections    4. Gastroesophageal reflux disease - Continue with Protonix daily.  Follow up: in 4 weeks for initial allergy injection   Thank you so much for letting me partake in your care today.  Don't hesitate to reach out if you have any additional concerns!  Roney Marion, MD  [facility] [place], [place]

## 2021-05-13 NOTE — Progress Notes (Signed)
Follow Up Note  RE: George Alvarez MRN: [id number] DOB: 05-10-1945 Date of Office Visit: 05/14/2021  Referring provider: Colon Branch, MD Primary care provider: Colon Branch, MD  Chief Complaint: Follow-up (Pt states he have been doing well, he had a flare in early January when e went to [place]. Broke out into rash and was given prednisone & depo.), Asthma, and Allergic Rhinitis   History of Present Illness: I had the pleasure of seeing George Alvarez for a follow up visit at the [facility] [place] [facility] [place] on 05/14/2021. He is a 76 y.o. male, who is being followed for persistent asthma, atopic dermatitis  and allergic rhinitis. His previous allergy office visit was on 02/19/21 with  Dr. Edison Pace  . Today is a regular follow up visit.  At last visit his asthma care was stepped up to Symbicort 160 mcg due to moderate obstruction on spirometry.  He was also restarted on Flonase for worsening rhinorrhea nasal congestion.  Today he reports  1) Asthma: Since last visit symptoms have  one episode of dyspnea associated with rash.  Responded to prednisone and dexamethasone injection he got at [facility].  Otherwise feels like symptoms are well controlled. No other albuterol use.  -Limitations to daily activity: none - 0 ED visit, 0 UC visits 0 hospitalizations since last visit - 0 oral steroids and 0 antibiotics for airway illness since last visit. -Up-to-date with pneumonia, Covid-19, and Flu vaccines. -Smoking exposure: Denies  -Current Regimen: Symbicort 160 mcg 2 puffs twice a day -Previous FEV1 % 65 at last visit -Total corticosteroid use in past year: 05 October 2020 after Covid19 infection  -Adverse effects of medications : none -Dexa and Cataract Screening: not indicated  2) Rhinitis : Reports symptoms have persisted mostly with morning congestion followed by PND and rhinnorhea.  He is interested in AIT.  He is on a betablocker.   HX: SPT 2020: positive to DM - Current regimen: Flonase 50  mcg 1 spray per nostril twice daily, Astelin 1 to 2 sprays up to 3 times a day as needed, montelukast 10 mg daily.   - Denies any adverse effects of medications.    3) Atopic Dermatitis  Reports onset of painful and pruritic rash on back of his neck in January after visiting daughter in [place].  He ultimately presented to UC where he was treated with prednisone and depo injection which controlled his symptoms.  Presumptive diagnosis was ACD.  Pictures are consist with contact dermatitis.   HX: Follows with Dr. Lavone Neri in dermatology.  He is can test positive to dust mite practices dust mite avoidance. Topicals include triamcinolone 0.1% cream, ketoconazole shampoo, hydrocortisone 2.5% cream.    Assessment and Plan: [name] is a 76 y.o. male with: Seasonal and perennial allergic rhinitis - Plan: Allergy Test, Allergen Immunotherapy  Intrinsic atopic dermatitis - Plan: Allergy Test, Allergen Immunotherapy  Mild persistent asthma without complication - Plan: Spirometry with Graph, Allergy Test, Allergen Immunotherapy  Gastroesophageal reflux disease without esophagitis Plan: Patient Instructions  Moderate persistent asthma  -  - Lung function showed mild inflammation in your lung, probably due to recent exacerbation which you have now recovered from.  We will continue current treatment plan and step up if your symptoms get worse  - Continue Symbicort 160mg  2 puffs twice a day  - Use spacer with inhaler, exhale fully before each puff - We will continue with albuterol as needed. - Call us with any issues  2. Allergic rhinitis (dust mites) - not well controlled  Skin test positive to Tree, [employer], DM  Skin test negative to grass, cat, dog, roach, horse, mouse, tobacco leaf  - Continue avoidance measures for dust mite, molds, trees, weed  - Continue  Flonase 29mcg 1 spray per nostril twice a day  - Continue Astelin (azelastine) use 1-2 sprays in each nostril up to three times daily as needed   - Continue montelukast 10mg  daily. - Use your nose sprays as needed as you are doing.  Start allergy injections. Had a detailed discussion with patient/family that clinical history is suggestive of allergic rhinitis, and may benefit from allergy immunotherapy (AIT). Discussed in detail regarding the dosing, schedule, side effects (mild to moderate local allergic reaction and rarely systemic allergic reactions including anaphylaxis/death), alternatives and benefits (significant improvement in nasal symptoms, seasonal flares of asthma) of immunotherapy with the patient. There is significant time commitment involved with allergy shots, which includes weekly immunotherapy injections for first 9-12 months and then biweekly to monthly injections for 3-5 years. Clinical response is often delayed and patient may not see an improvement for 6-12 months. Consent was signed. I have prescribed epinephrine injectable and demonstrated proper use. For mild symptoms you can take over the counter antihistamines such as Benadryl and monitor symptoms closely. If symptoms worsen or if you have severe symptoms including breathing issues, throat closure, significant swelling, whole body hives, severe diarrhea and vomiting, lightheadedness then inject epinephrine and seek immediate medical care afterwards. Action plan given.    3.Atopic dermatitis - Continue with moisturizing as needed.  - Follow up with Dermatology and continue current skin care regimen  - Plan to start Allergy Injections    4. Gastroesophageal reflux disease - Continue with Protonix daily.  Follow up: in 4 weeks for initial allergy injection   Thank you so much for letting me partake in your care today.  Don't hesitate to reach out if you have any additional concerns!  Roney Marion, MD  [facility] [place] [place]  Return in about 4 weeks (around 06/11/2021).  No orders of the defined types were placed in this encounter.   Lab  Orders  No laboratory test(s) ordered today   Diagnostics: Spirometry:  Tracings reviewed. His effort: Good reproducible efforts. FVC: 2.92L FEV1: 2.14L, 69% predicted FEV1/FVC ratio: 74 % Interpretation: Spirometry consistent with mixed obstructive and restrictive disease. After 4 puffs of albuterol there was not significant reversibility  Please see scanned spirometry results for details.  Skin Testing: Environmental allergy panel. Skin test positive to Tree, Weed, Mold, DM  Skin test negative to grass, cat, dog, roach, horse, mouse, tobacco leaf  Results interpreted by myself during this encounter and discussed with patient/family.   Medication List:  Current Outpatient Medications  Medication Sig Dispense Refill   albuterol (VENTOLIN HFA) 108 (90 Base) MCG/ACT inhaler Inhale 2 puffs into the lungs every 4 (four) hours as needed for wheezing or shortness of breath. 18 g 1   aspirin EC 81 MG tablet Take 1 tablet (81 mg total) by mouth daily. 90 tablet 3   azelastine (ASTELIN) 0.1 % nasal spray Place 2 sprays into both nostrils at bedtime as needed for rhinitis or allergies. 30 mL 5   budesonide-formoterol (SYMBICORT) 160-4.5 MCG/ACT inhaler Inhale 2 puffs into the lungs 2 (two) times daily. 1 each 5   cetirizine (ZYRTEC) 10 MG tablet Take 10 mg by mouth daily.     fluticasone (FLONASE) 50 MCG/ACT  nasal spray One spray each nostril twice a day if needed for stuffy nose. 16 g 5   hydrocortisone 2.5 % cream Apply 1 application topically daily as needed (Eczema).     ketoconazole (NIZORAL) 2 % shampoo Apply 1 application topically once a week.     metoprolol tartrate (LOPRESSOR) 25 MG tablet TAKE 0.5 TABLETS BY MOUTH 2 TIMES DAILY. 90 tablet 1   montelukast (SINGULAIR) 10 MG tablet Take 1 tablet (10 mg total) by mouth at bedtime. 90 tablet 3   Multiple Vitamin (MULTIVITAMIN WITH MINERALS) TABS tablet Take 1 tablet by mouth daily.     olmesartan-hydrochlorothiazide (BENICAR HCT) 20-12.5  MG tablet TAKE 1 TABLET BY MOUTH EVERY DAY 90 tablet 1   Omega-3 Fatty Acids (FISH OIL) 1200 MG CAPS Take 1,200 mg by mouth daily.     pantoprazole (PROTONIX) 40 MG tablet Take 1 tablet (40 mg total) by mouth 2 (two) times daily. 180 tablet 3   rosuvastatin (CRESTOR) 40 MG tablet TAKE 1 TABLET BY MOUTH EVERY DAY 90 tablet 1   tadalafil (CIALIS) 5 MG tablet Take 5 mg by mouth daily as needed.     tamsulosin (FLOMAX) 0.4 MG CAPS capsule Take 1 capsule (0.4 mg total) by mouth daily. 15 capsule 0   triamcinolone cream (KENALOG) 0.1 % Apply 1 application topically daily as needed (Eczema).      No current facility-administered medications for this visit.   Allergies: Allergies  Allergen Reactions   Nifedipine Dermatitis and Rash    edema   I reviewed his past medical history, social history, family history, and environmental history and no significant changes have been reported from his previous visit.  ROS: All others negative except as noted per HPI.   Objective: BP 132/78    Pulse 68    Temp 98.3 F (36.8 C) (Temporal)    Resp 18    Ht 5\' 10"  (1.778 m)    Wt 227 lb 12.8 oz (103.3 kg)    SpO2 97%    BMI 32.69 kg/m  Body mass index is 32.69 kg/m. General Appearance:  Alert, cooperative, no distress, appears stated age  Head:  Normocephalic, without obvious abnormality, atraumatic  Eyes:  Conjunctiva clear, EOM's intact  Nose: Nares normal,  bulbous nose, erythematous nasal mucosa with yellow rhinnorhea , hypertrophic turbinates, no visible anterior polyps, and septum midline  Throat: Lips, tongue normal; teeth and gums normal, no tonsillar exudate and + cobblestoning  Neck: Supple, symmetrical  Lungs:   clear to auscultation bilaterally, Respirations unlabored, no coughing  Heart:  regular rate and rhythm, Appears well perfused  Extremities: No edema  Skin: Skin color, texture, turgor normal, no rashes or lesions on visualized portions of skin  Neurologic: No gross deficits    Previous notes and tests were reviewed. The plan was reviewed with the patient/family, and all questions/concerned were addressed.  It was my pleasure to see Sladen today and participate in his care. Please feel free to contact me with any questions or concerns.  Sincerely,  Roney Marion, MD  Allergy & Immunology  [facility] [place] [facility] [place]

## 2021-05-14 ENCOUNTER — Other Ambulatory Visit: Payer: Self-pay

## 2021-05-14 ENCOUNTER — Encounter: Payer: Self-pay | Admitting: Internal Medicine

## 2021-05-14 ENCOUNTER — Ambulatory Visit: Payer: PPO | Admitting: Internal Medicine

## 2021-05-14 VITALS — BP 132/78 | HR 68 | Temp 98.3°F | Resp 18 | Ht 70.0 in | Wt 227.8 lb

## 2021-05-14 DIAGNOSIS — J4530 Mild persistent asthma, uncomplicated: Secondary | ICD-10-CM

## 2021-05-14 DIAGNOSIS — K219 Gastro-esophageal reflux disease without esophagitis: Secondary | ICD-10-CM | POA: Diagnosis not present

## 2021-05-14 DIAGNOSIS — L2084 Intrinsic (allergic) eczema: Secondary | ICD-10-CM

## 2021-05-14 DIAGNOSIS — J3089 Other allergic rhinitis: Secondary | ICD-10-CM | POA: Diagnosis not present

## 2021-05-14 DIAGNOSIS — J302 Other seasonal allergic rhinitis: Secondary | ICD-10-CM

## 2021-05-14 DIAGNOSIS — J45998 Other asthma: Secondary | ICD-10-CM | POA: Diagnosis not present

## 2021-05-21 ENCOUNTER — Encounter: Payer: Self-pay | Admitting: Internal Medicine

## 2021-05-27 NOTE — Progress Notes (Signed)
Aeroallergen Immunotherapy   Ordering Provider: Dr. Roney Marion   Patient Details  Name: George Alvarez  MRN: [id number]  Date of Birth: 02-16-1946   Order 1 of 1   Vial Label: T-W-M-DM   0.3 ml (Volume)  1:20 Concentration -- Ragweed Mix  0.2 ml (Volume)  1:20 Concentration -- Ash mix*  0.3 ml (Volume)  1:10 Concentration -- Oak, [place] mix*  0.2 ml (Volume)  1:20 Concentration -- Alternaria alternata  0.2 ml (Volume)  1:20 Concentration -- Drechslera spicifera  0.2 ml (Volume)  1:10 Concentration -- Fusarium moniliforme  0.2 ml (Volume)  1:40 Concentration -- Aureobasidium pullulans  0.5 ml (Volume)   AU Concentration -- Mite Mix (DF 5,000 & DP 5,000)    2.1  ml Extract Subtotal  2.9  ml Diluent  5.0  ml Maintenance Total   Schedule:  A  Blue Vial (1:100,000): Schedule B (6 doses)  Yellow Vial (1:10,000): Schedule B (6 doses)  Green Vial (1:1,000): Schedule B (6 doses)  Red Vial (1:100): Schedule A (10 doses)   Special Instructions: verify epipen use at first visit

## 2021-05-27 NOTE — Progress Notes (Signed)
VIALS EXP 05-27-22

## 2021-05-29 DIAGNOSIS — J3089 Other allergic rhinitis: Secondary | ICD-10-CM | POA: Diagnosis not present

## 2021-06-02 ENCOUNTER — Other Ambulatory Visit: Payer: Self-pay | Admitting: Internal Medicine

## 2021-06-13 DIAGNOSIS — D0439 Carcinoma in situ of skin of other parts of face: Secondary | ICD-10-CM | POA: Diagnosis not present

## 2021-06-18 ENCOUNTER — Ambulatory Visit: Payer: PPO

## 2021-06-19 DIAGNOSIS — M47816 Spondylosis without myelopathy or radiculopathy, lumbar region: Secondary | ICD-10-CM | POA: Diagnosis not present

## 2021-06-20 DIAGNOSIS — L905 Scar conditions and fibrosis of skin: Secondary | ICD-10-CM | POA: Diagnosis not present

## 2021-06-25 DIAGNOSIS — H02889 Meibomian gland dysfunction of unspecified eye, unspecified eyelid: Secondary | ICD-10-CM | POA: Diagnosis not present

## 2021-06-25 DIAGNOSIS — D3132 Benign neoplasm of left choroid: Secondary | ICD-10-CM | POA: Diagnosis not present

## 2021-06-25 DIAGNOSIS — E119 Type 2 diabetes mellitus without complications: Secondary | ICD-10-CM | POA: Diagnosis not present

## 2021-06-25 DIAGNOSIS — H524 Presbyopia: Secondary | ICD-10-CM | POA: Diagnosis not present

## 2021-06-25 DIAGNOSIS — H5213 Myopia, bilateral: Secondary | ICD-10-CM | POA: Diagnosis not present

## 2021-06-25 DIAGNOSIS — H25811 Combined forms of age-related cataract, right eye: Secondary | ICD-10-CM | POA: Diagnosis not present

## 2021-06-25 LAB — HM DIABETES EYE EXAM

## 2021-06-26 ENCOUNTER — Ambulatory Visit (INDEPENDENT_AMBULATORY_CARE_PROVIDER_SITE_OTHER): Payer: PPO

## 2021-06-26 ENCOUNTER — Other Ambulatory Visit: Payer: Self-pay

## 2021-06-26 DIAGNOSIS — J309 Allergic rhinitis, unspecified: Secondary | ICD-10-CM

## 2021-06-26 MED ORDER — EPINEPHRINE 0.3 MG/0.3ML IJ SOAJ: INTRAMUSCULAR | 3 refills | Status: AC

## 2021-06-26 NOTE — Progress Notes (Signed)
Immunotherapy ? ? ?Patient Details  ?Name: George Alvarez ?MRN: [id number] ?Date of Birth: [birth year] ? ?06/26/2021 ? ?George Alvarez started injections for Blue 1:100,000 (T-W-M-DM) @ 0.05 given ?Following schedule: B  ?Frequency:1 time per week ?Epi-Pen:Epi-Pen Available  ?Consent signed and patient instructions given. ? ? ?George Alvarez ?06/26/2021, 10:30 AM ? ? ?

## 2021-06-27 ENCOUNTER — Encounter: Payer: Self-pay | Admitting: Internal Medicine

## 2021-07-01 ENCOUNTER — Ambulatory Visit: Payer: PPO | Admitting: Cardiovascular Disease

## 2021-07-01 ENCOUNTER — Other Ambulatory Visit: Payer: Self-pay

## 2021-07-01 ENCOUNTER — Encounter: Payer: Self-pay | Admitting: Cardiovascular Disease

## 2021-07-01 VITALS — BP 118/76 | HR 72 | Ht 70.0 in | Wt 227.2 lb

## 2021-07-01 DIAGNOSIS — I2510 Atherosclerotic heart disease of native coronary artery without angina pectoris: Secondary | ICD-10-CM

## 2021-07-01 DIAGNOSIS — I10 Essential (primary) hypertension: Secondary | ICD-10-CM

## 2021-07-01 DIAGNOSIS — E7800 Pure hypercholesterolemia, unspecified: Secondary | ICD-10-CM | POA: Diagnosis not present

## 2021-07-01 NOTE — Patient Instructions (Signed)

## 2021-07-01 NOTE — Progress Notes (Signed)
? ? ?Chief Complaint  ?Patient presents with  ? Follow-up  ?  CAD  ? ?History of Present Illness: 76 yo male with history of HTN, HLD, PVCs and CAD who is here today for follow up. Cardiac cath on 02/06/12 with 95% proximal OM2 lesion and a 60% mid RCA lesion. EF was 65%. His OM2 was very large in caliber and was treated with bare-metal stents x 2. Medical management of RCA stenosis which was not felt to be flow limiting. He has chronic dizziness and is felt to have vertigo. Normal stress myoview December 2015. He has been limited by chronic back pain due to spinal stenosis but following back surgery in July 2019 his pain improved. Plavix was stopped in January 2021 and ASA was started. Echo January 2021 with LVEF=60-65%. No significant valve disease. Possible aortic root dilatation. Follow up chest CTA with no evidence of dilation of the ascending aorta. He was seen in our office June 2021 by Truitt Merle, NP and reported dyspnea and chest pain in the setting of an upper respiratory infection. Cardiac cath June 2021 with patent obtuse marginal stents, mild LAD disease and moderate RCA disease which was unchanged and not felt to be flow limiting. Normal LV systolic function.  ? ?She is here today for follow up. The patient denies any chest pain, dyspnea, palpitations, lower extremity edema, orthopnea, PND, dizziness, near syncope or syncope.  ? ?Primary Care Physician: Colon Branch, MD ? ?Past Medical History:  ?Diagnosis Date  ? Allergy   ? seasonal  ? Anal fissure   ? Arthritis   ? Asthma, chronic 06/16/2007  ? Qualifier: Diagnosis of  By: Linna Darner MD, Gwyndolyn Saxon   Onset:as child Triggers (environmental, infectious, allergic): all, mainly environmental triggers Rescue inhaler BPZ:WCHENI Maintenance medications/ response:Singulair,Qvar Smoking history:never Family history pulmonary disease: no    ? Basal cell carcinoma of chest wall 2016  ? 1.8 cm, treated with electrodessication and currettage  ? Benign paroxysmal  positional vertigo 11/19/2012  ? Diagnosed at [facility], [place]  Physical therapy appointment pending   ? BPH (benign prostatic hyperplasia)   ? With urinary obstruction  ? Coronary artery disease   ? a. 02/2012 Cath/PCI: LM 10, LAD min irregs, LCX large, OM1 sm, 40 ost, OM2 95p (5.0x16 Veriflex & 4.5x12 Veriflex BMS'), RCA 30p, 45m 30d, PDA/PLA min irregs, EF 65%  ? Diabetes mellitus without complication ([place]   ? Diverticulosis   ? Erectile dysfunction due to arterial insufficiency   ? Fundic gland polyps of stomach, benign 2010  ? GERD (gastroesophageal reflux disease)   ? History of elevated PSA   ? History of kidney stones   ? Hyperlipidemia   ? Hypertension   ? Nocturia   ? Perennial allergic rhinitis   ? Peyronie's disease   ? Skin cancer   ? Basal and squamous cell cancers, greater than 20  ? Stroke ([facility] 2016  ? Tubular adenoma of colon 2010  ? ? ?Past Surgical History:  ?Procedure Laterality Date  ? COLONOSCOPY  2016  ? COLONOSCOPY  06/2020  ? CORONARY ANGIOPLASTY WITH STENT PLACEMENT  02/06/2012  ? OM2  bare metal   ? EPIDURAL BLOCK INJECTION  04-2015  ? Dr BBrien Few ? epidural steroids  2007, 2009  ? X 2 @ cervical &, lumbar)  ? HERNIA REPAIR    ? INTRAVASCULAR ULTRASOUND  02/06/2012  ? Procedure: INTRAVASCULAR ULTRASOUND;  Surgeon: CBurnell Blanks MD;  [facility];  Service:  Cardiovascular;;  ? LEFT HEART CATH AND CORONARY ANGIOGRAPHY N/A 09/30/2019  ? Procedure: LEFT HEART CATH AND CORONARY ANGIOGRAPHY;  Surgeon: [name], MD;  Location: [place] CV LAB;  Service: Cardiovascular;  Laterality: N/A;  ? LEFT HEART CATHETERIZATION WITH CORONARY ANGIOGRAM N/A 02/06/2012  ? Procedure: LEFT HEART CATHETERIZATION WITH CORONARY ANGIOGRAM;  Surgeon: [name], MD;  [facility] C[facility] LAB;  Service: Cardiovascular;  Laterality: N/A;  ? LUMBAR LAMINECTOMY/DECOMPRESSION MICRODISCECTOMY N/A 10/21/2017  ? Procedure: Lumbar Two-Three, Lumbar Four-Five Discectomy;   Surgeon: Ashok Pall, MD;  Location: [place];  Service: Neurosurgery;  Laterality: N/A;  ? PERCUTANEOUS CORONARY STENT INTERVENTION (PCI-S)  02/06/2012  ? Procedure: PERCUTANEOUS CORONARY STENT INTERVENTION (PCI-S);  Surgeon: [name], MD;  Location: [facility] CATH LAB;  Service: Cardiovascular;;  ? septoplasty    ? TONSILLECTOMY AND ADENOIDECTOMY    ? TRIGGER FINGER RELEASE    ? UPPER GASTROINTESTINAL ENDOSCOPY  2011  ? gastric polyps  ? VASECTOMY    ? ? ?Current Outpatient Medications  ?Medication Sig Dispense Refill  ? albuterol (VENTOLIN HFA) 108 (90 Base) MCG/ACT inhaler Inhale 2 puffs into the lungs every 4 (four) hours as needed for wheezing or shortness of breath. 18 g 1  ? aspirin EC 81 MG tablet Take 1 tablet (81 mg total) by mouth daily. 90 tablet 3  ? azelastine (ASTELIN) 0.1 % nasal spray Place 2 sprays into both nostrils at bedtime as needed for rhinitis or allergies. 30 mL 5  ? budesonide-formoterol (SYMBICORT) 160-4.5 MCG/ACT inhaler Inhale 2 puffs into the lungs 2 (two) times daily. 1 each 5  ? cetirizine (ZYRTEC) 10 MG tablet Take 10 mg by mouth daily.    ? EPINEPHrine (EPIPEN 2-PAK) 0.3 mg/0.3 mL IJ SOAJ injection Use as directed for severe allergic reactions 2 each 3  ? fluticasone (FLONASE) 50 MCG/ACT nasal spray One spray each nostril twice a day if needed for stuffy nose. 16 g 5  ? hydrocortisone 2.5 % cream Apply 1 application topically daily as needed (Eczema).    ? ketoconazole (NIZORAL) 2 % shampoo Apply 1 application topically once a week.    ? metoprolol tartrate (LOPRESSOR) 25 MG tablet TAKE 1/2 TABLET BY MOUTH TWICE A DAY 90 tablet 1  ? montelukast (SINGULAIR) 10 MG tablet Take 1 tablet (10 mg total) by mouth at bedtime. 90 tablet 3  ? Multiple Vitamin (MULTIVITAMIN WITH MINERALS) TABS tablet Take 1 tablet by mouth daily.    ? olmesartan-hydrochlorothiazide (BENICAR HCT) 20-12.5 MG tablet TAKE 1 TABLET BY MOUTH EVERY DAY 90 tablet 1  ? Omega-3 Fatty Acids (FISH OIL) 1200 MG CAPS  Take 1,200 mg by mouth daily.    ? pantoprazole (PROTONIX) 40 MG tablet Take 1 tablet (40 mg total) by mouth 2 (two) times daily. 180 tablet 3  ? rosuvastatin (CRESTOR) 40 MG tablet TAKE 1 TABLET BY MOUTH EVERY DAY 90 tablet 1  ? tadalafil (CIALIS) 5 MG tablet Take 5 mg by mouth daily as needed.    ? tamsulosin (FLOMAX) 0.4 MG CAPS capsule Take 1 capsule (0.4 mg total) by mouth daily. 15 capsule 0  ? triamcinolone cream (KENALOG) 0.1 % Apply 1 application topically daily as needed (Eczema).     ? ?No current facility-administered medications for this visit.  ? ? ?Allergies  ?Allergen Reactions  ? Nifedipine Dermatitis and Rash  ?  edema  ? ? ?Social History  ? ?Socioeconomic History  ? Marital status: Married  ?  Spouse name:  Not on file  ? Number of children: 3  ? Years of education: Not on file  ? Highest education level: Not on file  ?Occupational History  ? Occupation: Higher education careers adviser   ?Tobacco Use  ? Smoking status: Never  ? Smokeless tobacco: Never  ?Vaping Use  ? Vaping Use: Never used  ?Substance and Sexual Activity  ? Alcohol use: Yes  ?  Alcohol/week: 0.0 standard drinks  ?  Comment: Wine occasionally  ? Drug use: No  ? Sexual activity: Yes  ?  Partners: Female  ?Other Topics Concern  ? Not on file  ?Social History Narrative  ? 3 daughters, 5 g-children  ? lifes w/ wife in [place]  ? Right handed  ? Two story home  ? ?Social Determinants of Health  ? ?Financial Resource Strain: Not on file  ?Food Insecurity: Not on file  ?Transportation Needs: No Transportation Needs  ? Lack of Transportation (Medical): No  ? Lack of Transportation (Non-Medical): No  ?Physical Activity: Sufficiently Active  ? Days of Exercise per Week: 3 days  ? Minutes of Exercise per Session: 60 min  ?Stress: Not on file  ?Social Connections: Not on file  ?Intimate Partner Violence: Not on file  ? ? ?Family History  ?Problem Relation Age of Onset  ? Heart attack Father 76  ?     died @ 51  ? Heart disease Mother   ?  Breast cancer Mother   ? Esophageal cancer Mother   ?     Barrett's  ? Heart attack Maternal Grandmother   ?     after 65  ? Dementia Maternal Grandmother   ?     CVA  ? Breast cancer Maternal Grandmother   ? Sharma Covert

## 2021-07-04 ENCOUNTER — Ambulatory Visit (INDEPENDENT_AMBULATORY_CARE_PROVIDER_SITE_OTHER): Payer: PPO

## 2021-07-04 DIAGNOSIS — J309 Allergic rhinitis, unspecified: Secondary | ICD-10-CM

## 2021-07-07 ENCOUNTER — Encounter: Payer: Self-pay | Admitting: Internal Medicine

## 2021-07-08 DIAGNOSIS — J45909 Unspecified asthma, uncomplicated: Secondary | ICD-10-CM | POA: Diagnosis not present

## 2021-07-08 DIAGNOSIS — U071 COVID-19: Secondary | ICD-10-CM | POA: Diagnosis not present

## 2021-07-08 DIAGNOSIS — Z20822 Contact with and (suspected) exposure to covid-19: Secondary | ICD-10-CM | POA: Diagnosis not present

## 2021-07-14 ENCOUNTER — Other Ambulatory Visit: Payer: Self-pay | Admitting: Internal Medicine

## 2021-07-18 ENCOUNTER — Ambulatory Visit (INDEPENDENT_AMBULATORY_CARE_PROVIDER_SITE_OTHER): Payer: PPO

## 2021-07-18 DIAGNOSIS — J309 Allergic rhinitis, unspecified: Secondary | ICD-10-CM | POA: Diagnosis not present

## 2021-07-29 ENCOUNTER — Ambulatory Visit (INDEPENDENT_AMBULATORY_CARE_PROVIDER_SITE_OTHER): Payer: PPO

## 2021-07-29 DIAGNOSIS — J309 Allergic rhinitis, unspecified: Secondary | ICD-10-CM | POA: Diagnosis not present

## 2021-08-08 ENCOUNTER — Ambulatory Visit (INDEPENDENT_AMBULATORY_CARE_PROVIDER_SITE_OTHER): Payer: PPO

## 2021-08-08 DIAGNOSIS — J309 Allergic rhinitis, unspecified: Secondary | ICD-10-CM | POA: Diagnosis not present

## 2021-08-14 ENCOUNTER — Ambulatory Visit (INDEPENDENT_AMBULATORY_CARE_PROVIDER_SITE_OTHER): Payer: PPO

## 2021-08-14 DIAGNOSIS — J309 Allergic rhinitis, unspecified: Secondary | ICD-10-CM

## 2021-08-16 ENCOUNTER — Ambulatory Visit (INDEPENDENT_AMBULATORY_CARE_PROVIDER_SITE_OTHER): Payer: PPO | Admitting: Family Medicine

## 2021-08-16 ENCOUNTER — Encounter: Payer: Self-pay | Admitting: Family Medicine

## 2021-08-16 VITALS — BP 120/70 | HR 84 | Temp 98.5°F | Resp 18 | Ht 70.0 in | Wt 226.6 lb

## 2021-08-16 DIAGNOSIS — R21 Rash and other nonspecific skin eruption: Secondary | ICD-10-CM

## 2021-08-16 MED ORDER — CHLORHEXIDINE GLUCONATE 4 % EX LIQD: Freq: Every day | CUTANEOUS | 0 refills | Status: AC | PRN

## 2021-08-16 MED ORDER — DOXYCYCLINE HYCLATE 100 MG PO TABS: 100.0000 mg | ORAL_TABLET | Freq: Two times a day (BID) | ORAL | 0 refills | Status: DC

## 2021-08-16 NOTE — Progress Notes (Signed)
? ?Subjective:  ? ?By signing my name below, I, George Alvarez, attest that this documentation has been prepared under the direction and in the presence of [name], DO. 08/16/2021   ? ? Patient ID: George Alvarez, male    DOB: [birth year], 76 y.o.   MRN: [id number] ? ?Chief Complaint  ?Patient presents with  ? Rash  ?  X1 week, pt states having boil like bumps on his arms, neck, back and sometimes stomach.   ? ? ?HPI ?Patient is in today for an office visit. ? ?He complains of sore rashes on his arms, chest and neck that started last week. He describes them as boils that contain pus and has been "popping" them with alcohol and needles. They started off as little bumps and increased in size. Has a history of MRSA. ? ?Past Medical History:  ?Diagnosis Date  ? Allergy   ? seasonal  ? Anal fissure   ? Arthritis   ? Asthma, chronic 06/16/2007  ? Qualifier: Diagnosis of  By: Linna Darner MD, Gwyndolyn Saxon   Onset:as child Triggers (environmental, infectious, allergic): all, mainly environmental triggers Rescue inhaler OVF:IEPPIR Maintenance medications/ response:Singulair,Qvar Smoking history:never Family history pulmonary disease: no    ? Basal cell carcinoma of chest wall 2016  ? 1.8 cm, treated with electrodessication and currettage  ? Benign paroxysmal positional vertigo 11/19/2012  ? Diagnosed at [facility], [place]  Physical therapy appointment pending   ? BPH (benign prostatic hyperplasia)   ? With urinary obstruction  ? Coronary artery disease   ? a. 02/2012 Cath/PCI: LM 10, LAD min irregs, LCX large, OM1 sm, 40 ost, OM2 95p (5.0x16 Veriflex & 4.5x12 Veriflex BMS'), RCA 30p, 55m 30d, PDA/PLA min irregs, EF 65%  ? Diabetes mellitus without complication ([place]   ? Diverticulosis   ? Erectile dysfunction due to arterial insufficiency   ? Fundic gland polyps of stomach, benign 2010  ? GERD (gastroesophageal reflux disease)   ? History of elevated PSA   ? History of kidney stones   ? Hyperlipidemia   ?  Hypertension   ? Nocturia   ? Perennial allergic rhinitis   ? Peyronie's disease   ? Skin cancer   ? Basal and squamous cell cancers, greater than 20  ? Stroke ([facility] 2016  ? Tubular adenoma of colon 2010  ? ? ?Past Surgical History:  ?Procedure Laterality Date  ? COLONOSCOPY  2016  ? COLONOSCOPY  06/2020  ? CORONARY ANGIOPLASTY WITH STENT PLACEMENT  02/06/2012  ? OM2  bare metal   ? EPIDURAL BLOCK INJECTION  04-2015  ? Dr BBrien Few ? epidural steroids  2007, 2009  ? X 2 @ cervical &, lumbar)  ? HERNIA REPAIR    ? INTRAVASCULAR ULTRASOUND  02/06/2012  ? Procedure: INTRAVASCULAR ULTRASOUND;  Surgeon: CBurnell Blanks MD;  [facility];  Service: Cardiovascular;;  ? LEFT HEART CATH AND CORONARY ANGIOGRAPHY N/A 09/30/2019  ? Procedure: LEFT HEART CATH AND CORONARY ANGIOGRAPHY;  Surgeon: MBurnell Blanks MD;  [facility];  Service: Cardiovascular;  Laterality: N/A;  ? LEFT HEART CATHETERIZATION WITH CORONARY ANGIOGRAM N/A 02/06/2012  ? Procedure: LEFT HEART CATHETERIZATION WITH CORONARY ANGIOGRAM;  Surgeon: CBurnell Blanks MD;  [facility];  Service: Cardiovascular;  Laterality: N/A;  ? LUMBAR LAMINECTOMY/DECOMPRESSION MICRODISCECTOMY N/A 10/21/2017  ? Procedure: Lumbar Two-Three, Lumbar Four-Five Discectomy;  Surgeon: CAshok Pall MD;  [place]  Service: Neurosurgery;  Laterality: N/A;  ?  PERCUTANEOUS CORONARY STENT INTERVENTION (PCI-S)  02/06/2012  ? Procedure: PERCUTANEOUS CORONARY STENT INTERVENTION (PCI-S);  Surgeon: [name], MD;  Location: [facility] CATH LAB;  Service: Cardiovascular;;  ? septoplasty    ? TONSILLECTOMY AND ADENOIDECTOMY    ? TRIGGER FINGER RELEASE    ? UPPER GASTROINTESTINAL ENDOSCOPY  2011  ? gastric polyps  ? VASECTOMY    ? ? ?Family History  ?Problem Relation Age of Onset  ? Heart attack Father 30  ?     died @ 34  ? Heart disease Mother   ? Breast cancer Mother   ? Esophageal cancer Mother   ?     Barrett's  ? Heart attack  Maternal Grandmother   ?     after 65  ? Dementia Maternal Grandmother   ?     CVA  ? Breast cancer Maternal Grandmother   ? Diabetes Maternal Grandmother   ? Stroke Maternal Grandmother   ?     in 40s  ? Heart attack Paternal Uncle 9  ? Breast cancer Maternal Aunt   ? Colon cancer Neg Hx   ? Prostate cancer Neg Hx   ? Colon polyps Neg Hx   ? Rectal cancer Neg Hx   ? Stomach cancer Neg Hx   ? ? ?Social History  ? ?Socioeconomic History  ? Marital status: Married  ?  Spouse name: Not on file  ? Number of children: 3  ? Years of education: Not on file  ? Highest education level: Not on file  ?Occupational History  ? Occupation: Higher education careers adviser   ?Tobacco Use  ? Smoking status: Never  ? Smokeless tobacco: Never  ?Vaping Use  ? Vaping Use: Never used  ?Substance and Sexual Activity  ? Alcohol use: Yes  ?  Alcohol/week: 0.0 standard drinks  ?  Comment: Wine occasionally  ? Drug use: No  ? Sexual activity: Yes  ?  Partners: Female  ?Other Topics Concern  ? Not on file  ?Social History Narrative  ? 3 daughters, 5 g-children  ? lifes w/ wife in [place]  ? Right handed  ? Two story home  ? ?Social Determinants of Health  ? ?Financial Resource Strain: Not on file  ?Food Insecurity: Not on file  ?Transportation Needs: No Transportation Needs  ? Lack of Transportation (Medical): No  ? Lack of Transportation (Non-Medical): No  ?Physical Activity: Sufficiently Active  ? Days of Exercise per Week: 3 days  ? Minutes of Exercise per Session: 60 min  ?Stress: Not on file  ?Social Connections: Not on file  ?Intimate Partner Violence: Not on file  ? ? ?Outpatient Medications Prior to Visit  ?Medication Sig Dispense Refill  ? albuterol (VENTOLIN HFA) 108 (90 Base) MCG/ACT inhaler Inhale 2 puffs into the lungs every 4 (four) hours as needed for wheezing or shortness of breath. 18 g 1  ? aspirin EC 81 MG tablet Take 1 tablet (81 mg total) by mouth daily. 90 tablet 3  ? azelastine (ASTELIN) 0.1 % nasal spray Place 2 sprays  into both nostrils at bedtime as needed for rhinitis or allergies. 30 mL 5  ? budesonide-formoterol (SYMBICORT) 160-4.5 MCG/ACT inhaler Inhale 2 puffs into the lungs 2 (two) times daily. 1 each 5  ? cetirizine (ZYRTEC) 10 MG tablet Take 10 mg by mouth daily.    ? EPINEPHrine (EPIPEN 2-PAK) 0.3 mg/0.3 mL IJ SOAJ injection Use as directed for severe allergic reactions 2 each 3  ? fluticasone (FLONASE) 50  MCG/ACT nasal spray One spray each nostril twice a day if needed for stuffy nose. 16 g 5  ? hydrocortisone 2.5 % cream Apply 1 application topically daily as needed (Eczema).    ? ketoconazole (NIZORAL) 2 % shampoo Apply 1 application topically once a week.    ? metoprolol tartrate (LOPRESSOR) 25 MG tablet TAKE 1/2 TABLET BY MOUTH TWICE A DAY 90 tablet 1  ? montelukast (SINGULAIR) 10 MG tablet Take 1 tablet (10 mg total) by mouth at bedtime. 90 tablet 3  ? Multiple Vitamin (MULTIVITAMIN WITH MINERALS) TABS tablet Take 1 tablet by mouth daily.    ? olmesartan-hydrochlorothiazide (BENICAR HCT) 20-12.5 MG tablet TAKE 1 TABLET BY MOUTH EVERY DAY 90 tablet 1  ? Omega-3 Fatty Acids (FISH OIL) 1200 MG CAPS Take 1,200 mg by mouth daily.    ? pantoprazole (PROTONIX) 40 MG tablet Take 1 tablet (40 mg total) by mouth 2 (two) times daily. 180 tablet 3  ? rosuvastatin (CRESTOR) 40 MG tablet TAKE 1 TABLET BY MOUTH EVERY DAY 90 tablet 1  ? tadalafil (CIALIS) 5 MG tablet Take 5 mg by mouth daily as needed.    ? tamsulosin (FLOMAX) 0.4 MG CAPS capsule Take 1 capsule (0.4 mg total) by mouth daily. 15 capsule 0  ? triamcinolone cream (KENALOG) 0.1 % Apply 1 application topically daily as needed (Eczema).     ? ?No facility-administered medications prior to visit.  ? ? ?Allergies  ?Allergen Reactions  ? Nifedipine Dermatitis and Rash  ?  edema  ? ? ?Review of Systems  ?Constitutional:  Negative for fever.  ?HENT:  Negative for congestion, ear pain, hearing loss, sinus pain and sore throat.   ?Eyes:  Negative for blurred vision and pain.   ?Respiratory:  Negative for cough, sputum production, shortness of breath and wheezing.   ?Cardiovascular:  Negative for chest pain and palpitations.  ?Gastrointestinal:  Negative for blood in stool, constipatio

## 2021-08-16 NOTE — Patient Instructions (Signed)
MRSA Infection, Diagnosis, Adult Methicillin-resistant Staphylococcus aureus (MRSA) infection is caused by bacteria called Staphylococcus aureus, or staph, that no longer respond to common antibiotic medicines (drug-resistant bacteria). MRSA infection can be hard to treat. Most of the time, MRSA can be on the skin or in the nose without causing problems (colonized). However, if MRSA enters the body through a cut, a sore, or an invasive medical device, it can cause a serious infection. What are the causes? This condition is caused by staph bacteria. Illness may develop after exposure to the bacteria through: Skin-to-skin contact with someone who is infected with MRSA. Touching surfaces that have the bacteria on them. Having a procedure or using equipment that allows MRSA to enter the body. Having MRSA that lives on your skin and then enters your body through: A cut or scratch. A surgery or procedure. The use of a medical device. Contact with the bacteria may occur: During a stay in a hospital, rehabilitation facility, nursing home, or other health care facility (health care-associated MRSA). In daily activities where there is close contact with others, such as sports, child care centers, or at home (community-associated MRSA). What increases the risk? You are more likely to develop this condition if you: Have a surgery or procedure. Have an IV or a thin tube (catheter) placed in your body. Are elderly. Are on kidney dialysis. Have recently taken an antibiotic medicine. Live in a long-term care facility. Have a chronic wound or skin ulcer. Have a weak body defense system (immune system). Play sports that involve skin-to-skin contact. Live in a crowded place, like a dormitory or military barracks. Share towels, razors, or sports equipment with other people. Have a history of MRSA infection or colonization. What are the signs or symptoms? Symptoms of this condition depend on the area that  is affected. Symptoms may include: A pus-filled pimple or boil. Pus that drains from your skin. A sore (abscess) under your skin or somewhere in your body. Fever with or without chills. Difficulty breathing. Coughing up blood. Redness, warmth, swelling, or pain in the affected area. How is this diagnosed? This condition may be diagnosed based on: A physical exam. Your medical history. Taking a sample from the infected area and growing it in a lab (culture). You may also have other tests, including: Imaging tests, such as X-rays, a CT scan, or an MRI. Lab tests, such as blood, urine, or phlegm (sputum) tests. You skin or nose may be swabbed when you are admitted to a health care facility for a procedure. This is to screen for MRSA. How is this treated? Treatment depends on the type of MRSA infection you have and how severe, deep, or extensive it is. Treatment may include: Antibiotic medicines. Surgery to drain pus from the infected area. Severe infections may require a hospital stay. Follow these instructions at home: Medicines Take over-the-counter and prescription medicines only as told by your health care provider. If you were prescribed an antibiotic medicine, use it as told by your health care provider. Do not stop using the antibiotic even if you start to feel better. Prevention Follow these instructions to avoid spreading the infection to others: Wash your hands frequently with soap and water. If soap and water are not available, use an alcohol-based hand sanitizer. Avoid close contact with those around you as much as possible. Do not use towels, razors, toothbrushes, bedding, or other items that will be used by others. Wash towels, bedding, and clothes in the washing machine with detergent   and hot water. Dry them in a hot dryer. Clean surfaces regularly to remove germs (disinfection). Use products or solutions that contain bleach. Make sure you disinfect bathroom surfaces, food  preparation areas, exercise equipment, and doorknobs.  General instructions If you have a wound, follow instructions from your health care provider about how to take care of your wound. Do not pick at scabs. Do not try to drain any infection sites or pimples. Tell all your health care providers that you have MRSA, or if you have ever had a MRSA infection. Keep all follow-up visits as told by your health care provider. This is important. Contact a health care provider if you: Do not get better. Have symptoms that get worse. Have new symptoms. Get help right away if you have: Nausea or vomiting, or if you cannot take medicine without vomiting. Trouble breathing. Chest pain. These symptoms may represent a serious problem that is an emergency. Do not wait to see if the symptoms will go away. Get medical help right away. Call your local emergency services (911 in the [place] Do not drive yourself to the hospital. Summary MRSA infection is caused by bacteria called Staphylococcus aureus, or staph, that no longer respond to common antibiotic medicines. Treatment for this condition depends on the type of MRSA infection you have and how severe, deep, and extensive it is. If you were prescribed an antibiotic medicine, use it as told by your health care provider. Do not stop using the antibiotic even if you start to feel better. Follow instructions from your health care provider to avoid spreading the infection to others. This information is not intended to replace advice given to you by your health care provider. Make sure you discuss any questions you have with your health care provider. Document Revised: 06/10/2018 Document Reviewed: 06/11/2018 Elsevier Patient Education  2023 [employer]  

## 2021-08-16 NOTE — Assessment & Plan Note (Signed)
Suspect mrsa ?Doxy x 10 days  ?hibiclens wash ?F/u with derm if no better  ?

## 2021-08-19 ENCOUNTER — Encounter: Payer: Self-pay | Admitting: Family Medicine

## 2021-08-19 LAB — WOUND CULTURE
MICRO NUMBER:: 13389147
SPECIMEN QUALITY:: ADEQUATE

## 2021-08-30 ENCOUNTER — Ambulatory Visit (INDEPENDENT_AMBULATORY_CARE_PROVIDER_SITE_OTHER): Payer: PPO

## 2021-08-30 DIAGNOSIS — J309 Allergic rhinitis, unspecified: Secondary | ICD-10-CM | POA: Diagnosis not present

## 2021-09-06 ENCOUNTER — Ambulatory Visit (INDEPENDENT_AMBULATORY_CARE_PROVIDER_SITE_OTHER): Payer: PPO

## 2021-09-06 DIAGNOSIS — J309 Allergic rhinitis, unspecified: Secondary | ICD-10-CM | POA: Diagnosis not present

## 2021-09-17 ENCOUNTER — Ambulatory Visit (INDEPENDENT_AMBULATORY_CARE_PROVIDER_SITE_OTHER): Payer: PPO | Admitting: Family Medicine

## 2021-09-17 ENCOUNTER — Encounter: Payer: Self-pay | Admitting: Family Medicine

## 2021-09-17 VITALS — BP 128/81 | HR 70 | Temp 98.2°F | Resp 12 | Ht 70.0 in | Wt 228.0 lb

## 2021-09-17 DIAGNOSIS — M26609 Unspecified temporomandibular joint disorder, unspecified side: Secondary | ICD-10-CM

## 2021-09-17 DIAGNOSIS — A4902 Methicillin resistant Staphylococcus aureus infection, unspecified site: Secondary | ICD-10-CM | POA: Diagnosis not present

## 2021-09-17 MED ORDER — CYCLOBENZAPRINE HCL 5 MG PO TABS: 5.0000 mg | ORAL_TABLET | Freq: Three times a day (TID) | ORAL | 1 refills | Status: DC | PRN

## 2021-09-17 MED ORDER — DOXYCYCLINE HYCLATE 100 MG PO TABS: 100.0000 mg | ORAL_TABLET | Freq: Two times a day (BID) | ORAL | 0 refills | Status: DC

## 2021-09-17 NOTE — Patient Instructions (Signed)
MRSA Infection, Diagnosis, Adult Methicillin-resistant Staphylococcus aureus (MRSA) infection is caused by bacteria called Staphylococcus aureus, or staph, that no longer respond to common antibiotic medicines (drug-resistant bacteria). MRSA infection can be hard to treat. Most of the time, MRSA can be on the skin or in the nose without causing problems (colonized). However, if MRSA enters the body through a cut, a sore, or an invasive medical device, it can cause a serious infection. What are the causes? This condition is caused by staph bacteria. Illness may develop after exposure to the bacteria through: Skin-to-skin contact with someone who is infected with MRSA. Touching surfaces that have the bacteria on them. Having a procedure or using equipment that allows MRSA to enter the body. Having MRSA that lives on your skin and then enters your body through: A cut or scratch. A surgery or procedure. The use of a medical device. Contact with the bacteria may occur: During a stay in a hospital, rehabilitation facility, nursing home, or other health care facility (health care-associated MRSA). In daily activities where there is close contact with others, such as sports, child care centers, or at home (community-associated MRSA). What increases the risk? You are more likely to develop this condition if you: Have a surgery or procedure. Have an IV or a thin tube (catheter) placed in your body. Are elderly. Are on kidney dialysis. Have recently taken an antibiotic medicine. Live in a long-term care facility. Have a chronic wound or skin ulcer. Have a weak body defense system (immune system). Play sports that involve skin-to-skin contact. Live in a crowded place, like a dormitory or [employer] Share towels, razors, or sports equipment with other people. Have a history of MRSA infection or colonization. What are the signs or symptoms? Symptoms of this condition depend on the area that  is affected. Symptoms may include: A pus-filled pimple or boil. Pus that drains from your skin. A sore (abscess) under your skin or somewhere in your body. Fever with or without chills. Difficulty breathing. Coughing up blood. Redness, warmth, swelling, or pain in the affected area. How is this diagnosed? This condition may be diagnosed based on: A physical exam. Your medical history. Taking a sample from the infected area and growing it in a lab (culture). You may also have other tests, including: Imaging tests, such as X-rays, a CT scan, or an MRI. Lab tests, such as blood, urine, or phlegm (sputum) tests. You skin or nose may be swabbed when you are admitted to a health care facility for a procedure. This is to screen for MRSA. How is this treated? Treatment depends on the type of MRSA infection you have and how severe, deep, or extensive it is. Treatment may include: Antibiotic medicines. Surgery to drain pus from the infected area. Severe infections may require a hospital stay. Follow these instructions at home: Medicines Take over-the-counter and prescription medicines only as told by your health care provider. If you were prescribed an antibiotic medicine, use it as told by your health care provider. Do not stop using the antibiotic even if you start to feel better. Prevention Follow these instructions to avoid spreading the infection to others: Wash your hands frequently with soap and water. If soap and water are not available, use an alcohol-based hand sanitizer. Avoid close contact with those around you as much as possible. Do not use towels, razors, toothbrushes, bedding, or other items that will be used by others. Wash towels, bedding, and clothes in the washing machine with detergent  and hot water. Dry them in a hot dryer. Clean surfaces regularly to remove germs (disinfection). Use products or solutions that contain bleach. Make sure you disinfect bathroom surfaces, food  preparation areas, exercise equipment, and doorknobs.  General instructions If you have a wound, follow instructions from your health care provider about how to take care of your wound. Do not pick at scabs. Do not try to drain any infection sites or pimples. Tell all your health care providers that you have MRSA, or if you have ever had a MRSA infection. Keep all follow-up visits as told by your health care provider. This is important. Contact a health care provider if you: Do not get better. Have symptoms that get worse. Have new symptoms. Get help right away if you have: Nausea or vomiting, or if you cannot take medicine without vomiting. Trouble breathing. Chest pain. These symptoms may represent a serious problem that is an emergency. Do not wait to see if the symptoms will go away. Get medical help right away. Call your local emergency services (911 in the [place] Do not drive yourself to the hospital. Summary MRSA infection is caused by bacteria called Staphylococcus aureus, or staph, that no longer respond to common antibiotic medicines. Treatment for this condition depends on the type of MRSA infection you have and how severe, deep, and extensive it is. If you were prescribed an antibiotic medicine, use it as told by your health care provider. Do not stop using the antibiotic even if you start to feel better. Follow instructions from your health care provider to avoid spreading the infection to others. This information is not intended to replace advice given to you by your health care provider. Make sure you discuss any questions you have with your health care provider. Document Revised: 06/10/2018 Document Reviewed: 06/11/2018 Elsevier Patient Education  [place].

## 2021-09-17 NOTE — Assessment & Plan Note (Signed)
F/u dentist  Muscle relaxer called in ---- advised to only take at night

## 2021-09-17 NOTE — Assessment & Plan Note (Signed)
Doxy x 10 days  Use hibiclens F/u pcp

## 2021-09-17 NOTE — Progress Notes (Signed)
Subjective:   By signing my name below, I, Shehryar Baig, attest that this documentation has been prepared under the direction and in the presence of [name], DO  09/17/2021    Patient ID: George Alvarez, male    DOB: 1946-01-07, 76 y.o.   MRN: [id number]  Chief Complaint  Patient presents with   Rash all over    HPI Patient is in today for a office visit.   He complains of rashes developing on his hands and left forearm. He recovered from his last episodes of rashes 3 weeks ago. He was taking 4% Hibiclens soap to manage his symptoms and reports relief while taking it. He notes taking a break from applying it while he was visiting his mother in [place]. He has no follow up appointment with his dermatologist regarding his current symptoms. He has a history of MRSA.  He complains of pain in his left jaw. He has an upcomming appointment with his dentist and is planning on bringing it up with them. He reports having a history grinding his teeth while sleeping. He used a mouth guard in the past.    Past Medical History:  Diagnosis Date   Allergy    seasonal   Anal fissure    Arthritis    Asthma, chronic 06/16/2007   Qualifier: Diagnosis of  By: Linna Darner MD, Gwyndolyn Saxon   Onset:as child Triggers (environmental, infectious, allergic): all, mainly environmental triggers Rescue inhaler IPJ:ASNKNL Maintenance medications/ response:Singulair,Qvar Smoking history:never Family history pulmonary disease: no     Basal cell carcinoma of chest wall 2016   1.8 cm, treated with electrodessication and currettage   Benign paroxysmal positional vertigo 11/19/2012   Diagnosed at [facility], [place]  Physical therapy appointment pending    BPH (benign prostatic hyperplasia)    With urinary obstruction   Coronary artery disease    a. 02/2012 Cath/PCI: LM 10, LAD min irregs, LCX large, OM1 sm, 40 ost, OM2 95p (5.0x16 Veriflex & 4.5x12 Veriflex BMS'), RCA 30p, 50m 30d, PDA/PLA min irregs,  EF 65%   [facility]    Diverticulosis    Erectile dysfunction due to arterial insufficiency    Fundic gland polyps of stomach, benign 2010   GERD (gastroesophageal reflux disease)    History of elevated PSA    History of kidney stones    Hyperlipidemia    Hypertension    Nocturia    Perennial allergic rhinitis    Peyronie's disease    Skin cancer    Basal and squamous cell cancers, greater than 20   Stroke ([facility] 2016   Tubular adenoma of colon 2010    Past Surgical History:  Procedure Laterality Date   COLONOSCOPY  2016   COLONOSCOPY  06/2020   CORONARY ANGIOPLASTY WITH STENT PLACEMENT  02/06/2012   OM2  bare metal    EPIDURAL BLOCK INJECTION  04-2015   Dr BBrien Few  epidural steroids  2007, 2009   X 2 @ cervical &, lumbar)   HERNIA REPAIR     INTRAVASCULAR ULTRASOUND  02/06/2012   Procedure: INTRAVASCULAR ULTRASOUND;  Surgeon: CBurnell Blanks MD;  [facility];  Service: Cardiovascular;;   LEFT HEART CATH AND CORONARY ANGIOGRAPHY N/A 09/30/2019   Procedure: LEFT HEART CATH AND CORONARY ANGIOGRAPHY;  Surgeon: MBurnell Blanks MD;  [facility];  Service: Cardiovascular;  Laterality: N/A;   LEFT HEART CATHETERIZATION WITH CORONARY ANGIOGRAM N/A 02/06/2012   Procedure:  LEFT HEART CATHETERIZATION WITH CORONARY ANGIOGRAM;  Surgeon: [name], MD;  Location: [facility] CATH LAB;  Service: Cardiovascular;  Laterality: N/A;   LUMBAR LAMINECTOMY/DECOMPRESSION MICRODISCECTOMY N/A 10/21/2017   Procedure: Lumbar Two-Three, Lumbar Four-Five Discectomy;  Surgeon: Ashok Pall, MD;  Location: [place];  Service: Neurosurgery;  Laterality: N/A;   PERCUTANEOUS CORONARY STENT INTERVENTION (PCI-S)  02/06/2012   Procedure: PERCUTANEOUS CORONARY STENT INTERVENTION (PCI-S);  Surgeon: [name], MD;  Location: [facility] CATH LAB;  Service: Cardiovascular;;   septoplasty     TONSILLECTOMY AND ADENOIDECTOMY     TRIGGER  FINGER RELEASE     UPPER GASTROINTESTINAL ENDOSCOPY  2011   gastric polyps   VASECTOMY      Family History  Problem Relation Age of Onset   Heart attack Father 90       died @ 34   Heart disease Mother    Breast cancer Mother    Esophageal cancer Mother        Barrett's   Heart attack Maternal Grandmother        after 64   Dementia Maternal Grandmother        CVA   Breast cancer Maternal Grandmother    Diabetes Maternal Grandmother    Stroke Maternal Grandmother        in 85s   Heart attack Paternal Uncle 51   Breast cancer Maternal Aunt    Colon cancer Neg Hx    Prostate cancer Neg Hx    Colon polyps Neg Hx    Rectal cancer Neg Hx    Stomach cancer Neg Hx     Social History   Socioeconomic History   Marital status: Married    Spouse name: Not on file   Number of children: 3   Years of education: Not on file   Highest education level: Not on file  Occupational History   Occupation: Higher education careers adviser   Tobacco Use   Smoking status: Never   Smokeless tobacco: Never  Vaping Use   Vaping Use: Never used  Substance and Sexual Activity   Alcohol use: Yes    Alcohol/week: 0.0 standard drinks of alcohol    Comment: Wine occasionally   Drug use: No   Sexual activity: Yes    Partners: Female  Other Topics Concern   Not on file  Social History Narrative   3 daughters, 46 g-children   lifes w/ wife in [place]   Right handed   Two story home   Social Determinants of Health   Financial Resource Strain: Not on file  Food Insecurity: Not on file  Transportation Needs: No Transportation Needs (09/19/2020)   PRAPARE - Hydrologist (Medical): No    Lack of Transportation (Non-Medical): No  Physical Activity: Sufficiently Active (09/19/2020)   Exercise Vital Sign    Days of Exercise per Week: 3 days    Minutes of Exercise per Session: 60 min  Stress: Not on file  Social Connections: Not on file  Intimate Partner Violence:  Not on file    Outpatient Medications Prior to Visit  Medication Sig Dispense Refill   albuterol (VENTOLIN HFA) 108 (90 Base) MCG/ACT inhaler Inhale 2 puffs into the lungs every 4 (four) hours as needed for wheezing or shortness of breath. 18 g 1   aspirin EC 81 MG tablet Take 1 tablet (81 mg total) by mouth daily. 90 tablet 3   azelastine (ASTELIN) 0.1 % nasal spray Place 2 sprays into both  nostrils at bedtime as needed for rhinitis or allergies. 30 mL 5   budesonide-formoterol (SYMBICORT) 160-4.5 MCG/ACT inhaler Inhale 2 puffs into the lungs 2 (two) times daily. 1 each 5   cetirizine (ZYRTEC) 10 MG tablet Take 10 mg by mouth daily.     chlorhexidine (HIBICLENS) 4 % external liquid Apply topically daily as needed. 120 mL 0   EPINEPHrine (EPIPEN 2-PAK) 0.3 mg/0.3 mL IJ SOAJ injection Use as directed for severe allergic reactions 2 each 3   fluticasone (FLONASE) 50 MCG/ACT nasal spray One spray each nostril twice a day if needed for stuffy nose. 16 g 5   hydrocortisone 2.5 % cream Apply 1 application topically daily as needed (Eczema).     ketoconazole (NIZORAL) 2 % shampoo Apply 1 application topically once a week.     metoprolol tartrate (LOPRESSOR) 25 MG tablet TAKE 1/2 TABLET BY MOUTH TWICE A DAY 90 tablet 1   montelukast (SINGULAIR) 10 MG tablet Take 1 tablet (10 mg total) by mouth at bedtime. 90 tablet 3   Multiple Vitamin (MULTIVITAMIN WITH MINERALS) TABS tablet Take 1 tablet by mouth daily.     olmesartan-hydrochlorothiazide (BENICAR HCT) 20-12.5 MG tablet TAKE 1 TABLET BY MOUTH EVERY DAY 90 tablet 1   Omega-3 Fatty Acids (FISH OIL) 1200 MG CAPS Take 1,200 mg by mouth daily.     pantoprazole (PROTONIX) 40 MG tablet Take 1 tablet (40 mg total) by mouth 2 (two) times daily. 180 tablet 3   rosuvastatin (CRESTOR) 40 MG tablet TAKE 1 TABLET BY MOUTH EVERY DAY 90 tablet 1   tadalafil (CIALIS) 5 MG tablet Take 5 mg by mouth daily as needed.     tamsulosin (FLOMAX) 0.4 MG CAPS capsule Take 1  capsule (0.4 mg total) by mouth daily. 15 capsule 0   triamcinolone cream (KENALOG) 0.1 % Apply 1 application topically daily as needed (Eczema).      doxycycline (VIBRA-TABS) 100 MG tablet Take 1 tablet (100 mg total) by mouth 2 (two) times daily. 20 tablet 0   No facility-administered medications prior to visit.    Allergies  Allergen Reactions   Nifedipine Dermatitis and Rash    edema    Review of Systems  Constitutional:  Negative for fever and malaise/fatigue.  HENT:  Negative for congestion.   Eyes:  Negative for blurred vision.  Respiratory:  Negative for cough and shortness of breath.   Cardiovascular:  Negative for chest pain, palpitations and leg swelling.  Gastrointestinal:  Negative for vomiting.  Musculoskeletal:  Negative for back pain.  Skin:  Positive for rash (bilateral hands and left forearm).  Neurological:  Negative for loss of consciousness and headaches.       Objective:    Physical Exam Vitals and nursing note reviewed.  Constitutional:      General: He is not in acute distress.    Appearance: Normal appearance. He is not ill-appearing.  HENT:     Head: Normocephalic and atraumatic.     Right Ear: External ear normal.     Left Ear: External ear normal.  Eyes:     Extraocular Movements: Extraocular movements intact.     Pupils: Pupils are equal, round, and reactive to light.  Cardiovascular:     Rate and Rhythm: Normal rate and regular rhythm.     Heart sounds: Normal heart sounds. No murmur heard.    No gallop.  Pulmonary:     Effort: Pulmonary effort is normal. No respiratory distress.     Breath  sounds: Normal breath sounds. No wheezing or rales.  Skin:    General: Skin is warm and dry.     Findings: Rash (hands and left forearm) present.  Neurological:     Mental Status: He is alert and oriented to person, place, and time.  Psychiatric:        Judgment: Judgment normal.     BP 128/81 (BP Location: Left Arm, Cuff Size: Large)   Pulse  70   Temp 98.2 F (36.8 C) (Oral)   Resp 12   Ht '5\' 10"'$  (1.778 m)   Wt 228 lb (103.4 kg)   SpO2 93%   BMI 32.71 kg/m  Wt Readings from Last 3 Encounters:  09/17/21 228 lb (103.4 kg)  08/16/21 226 lb 9.6 oz (102.8 kg)  07/01/21 227 lb 3.2 oz (103.1 kg)    Diabetic Foot Exam - Simple   No data filed    Lab Results  Component Value Date   WBC 5.9 04/03/2021   HGB 14.4 04/03/2021   HCT 42.5 04/03/2021   PLT 131.0 (L) 04/03/2021   GLUCOSE 126 (H) 04/03/2021   CHOL 117 04/03/2021   TRIG 252.0 (H) 04/03/2021   HDL 34.30 (L) 04/03/2021   LDLDIRECT 59.0 04/03/2021   LDLCALC 42 04/12/2019   ALT 25 04/03/2021   AST 27 04/03/2021   NA 139 04/03/2021   K 4.2 04/03/2021   CL 105 04/03/2021   CREATININE 1.17 04/03/2021   BUN 19 04/03/2021   CO2 26 04/03/2021   TSH 2.38 07/23/2017   PSA 2.09 03/15/2021   INR 1.0 02/05/2012   HGBA1C 6.5 04/03/2021   MICROALBUR 0.6 05/15/2006    Lab Results  Component Value Date   TSH 2.38 07/23/2017   Lab Results  Component Value Date   WBC 5.9 04/03/2021   HGB 14.4 04/03/2021   HCT 42.5 04/03/2021   MCV 89.6 04/03/2021   PLT 131.0 (L) 04/03/2021   Lab Results  Component Value Date   NA 139 04/03/2021   K 4.2 04/03/2021   CO2 26 04/03/2021   GLUCOSE 126 (H) 04/03/2021   BUN 19 04/03/2021   CREATININE 1.17 04/03/2021   BILITOT 0.7 04/03/2021   ALKPHOS 50 04/03/2021   AST 27 04/03/2021   ALT 25 04/03/2021   PROT 6.3 04/03/2021   ALBUMIN 4.3 04/03/2021   CALCIUM 9.4 04/03/2021   ANIONGAP 8 10/16/2017   GFR 62.02 04/03/2021   Lab Results  Component Value Date   CHOL 117 04/03/2021   Lab Results  Component Value Date   HDL 34.30 (L) 04/03/2021   Lab Results  Component Value Date   LDLCALC 42 04/12/2019   Lab Results  Component Value Date   TRIG 252.0 (H) 04/03/2021   Lab Results  Component Value Date   CHOLHDL 3 04/03/2021   Lab Results  Component Value Date   HGBA1C 6.5 04/03/2021       Assessment &  Plan:   Problem List Items Addressed This Visit       Unprioritized   TMJ (temporomandibular joint disorder)    F/u dentist  Muscle relaxer called in ---- advised to only take at night       Relevant Medications   cyclobenzaprine (FLEXERIL) 5 MG tablet   MRSA (methicillin resistant Staphylococcus aureus) infection - Primary    Doxy x 10 days  Use hibiclens F/u pcp      Relevant Medications   doxycycline (VIBRA-TABS) 100 MG tablet     Meds ordered  this encounter  Medications   doxycycline (VIBRA-TABS) 100 MG tablet    Sig: Take 1 tablet (100 mg total) by mouth 2 (two) times daily.    Dispense:  20 tablet    Refill:  0   cyclobenzaprine (FLEXERIL) 5 MG tablet    Sig: Take 1 tablet (5 mg total) by mouth 3 (three) times daily as needed for muscle spasms.    Dispense:  30 tablet    Refill:  1    I, [name], DO, personally preformed the services described in this documentation.  All medical record entries made by the scribe were at my direction and in my presence.  I have reviewed the chart and discharge instructions (if applicable) and agree that the record reflects my personal performance and is accurate and complete. 09/17/2021   I,Shehryar Baig,acting as a Education administrator for [employer], DO.,have documented all relevant documentation on the behalf of [name], DO,as directed by  [name], DO while in the presence of [name], DO.   [name], DO

## 2021-09-25 ENCOUNTER — Encounter: Payer: Self-pay | Admitting: *Deleted

## 2021-10-02 ENCOUNTER — Ambulatory Visit (INDEPENDENT_AMBULATORY_CARE_PROVIDER_SITE_OTHER): Payer: PPO | Admitting: Internal Medicine

## 2021-10-02 ENCOUNTER — Encounter: Payer: Self-pay | Admitting: Internal Medicine

## 2021-10-02 ENCOUNTER — Ambulatory Visit (INDEPENDENT_AMBULATORY_CARE_PROVIDER_SITE_OTHER): Payer: PPO

## 2021-10-02 VITALS — BP 131/78 | HR 62 | Temp 98.0°F | Resp 16 | Ht 70.0 in | Wt 228.2 lb

## 2021-10-02 DIAGNOSIS — I10 Essential (primary) hypertension: Secondary | ICD-10-CM

## 2021-10-02 DIAGNOSIS — J309 Allergic rhinitis, unspecified: Secondary | ICD-10-CM

## 2021-10-02 DIAGNOSIS — E119 Type 2 diabetes mellitus without complications: Secondary | ICD-10-CM | POA: Diagnosis not present

## 2021-10-02 DIAGNOSIS — B958 Unspecified staphylococcus as the cause of diseases classified elsewhere: Secondary | ICD-10-CM | POA: Diagnosis not present

## 2021-10-02 LAB — BASIC METABOLIC PANEL
BUN: 17 mg/dL (ref 6–23)
CO2: 26 mEq/L (ref 19–32)
Calcium: 9.4 mg/dL (ref 8.4–10.5)
Chloride: 106 mEq/L (ref 96–112)
Creatinine, Ser: 1.07 mg/dL (ref 0.40–1.50)
GFR: 68.8 mL/min (ref >60.00)
Glucose, Bld: 176 mg/dL — ABNORMAL HIGH (ref 70–99)
Potassium: 4 mEq/L (ref 3.5–5.1)
Sodium: 140 mEq/L (ref 135–145)

## 2021-10-02 LAB — HEMOGLOBIN A1C: Hgb A1c MFr Bld: 6.7 % — ABNORMAL HIGH (ref 4.6–6.5)

## 2021-10-02 MED ORDER — MUPIROCIN 2 % EX OINT: 1.0000 | TOPICAL_OINTMENT | Freq: Two times a day (BID) | CUTANEOUS | 0 refills | Status: AC

## 2021-10-02 NOTE — Assessment & Plan Note (Signed)
DM: Diet controlled, check A1c. CAD: Asymptomatic, controlling CV RF HTN: BP is very good, continue metoprolol, Benicar, check BMP. Staph infection: see HPI, currently asx, patient is extremely concerned, advised that we all have staph bacteria in our skin and from time to time it can cause problems.  We agreed to use mupirocin in the nostril to decrease the staph burden. Also, if has  another infection, okay to send doxycycline over the phone however if he has severe symptoms he needs to be seen.  See AVS. Preventive care: Rec to proceed with a booster for COVID-vaccine and a flu shot in the fall. RTC CPX 03/2022

## 2021-10-02 NOTE — Progress Notes (Signed)
Subjective:    Patient ID: George Alvarez, male    DOB: [birth year], 76 y.o.   MRN: [id number]  DOS:  10/02/2021 Type of visit - description: f/u  Seen at this office 08/2021 with a rash on the upper extremities, a wound culture showed with a staph infection, was Rx doxycycline and chlorhexidine. Subsequently the rash came back and was ordered additional antibiotics.  Currently asymptomatic but very concerned about the issue. Denies fever chills  No chest pain or difficulty breathing. Chronic back pain improved  Review of Systems See above   Past Medical History:  Diagnosis Date   Allergy    seasonal   Anal fissure    Arthritis    Asthma, chronic 06/16/2007   Qualifier: Diagnosis of  By: Linna Darner MD, Gwyndolyn Saxon   Onset:as child Triggers (environmental, infectious, allergic): all, mainly environmental triggers Rescue inhaler WHQ:PRFFMB Maintenance medications/ response:Singulair,Qvar Smoking history:never Family history pulmonary disease: no     Basal cell carcinoma of chest wall 2016   1.8 cm, treated with electrodessication and currettage   Benign paroxysmal positional vertigo 11/19/2012   Diagnosed at [facility], [place]  Physical therapy appointment pending    BPH (benign prostatic hyperplasia)    With urinary obstruction   Coronary artery disease    a. 02/2012 Cath/PCI: LM 10, LAD min irregs, LCX large, OM1 sm, 40 ost, OM2 95p (5.0x16 Veriflex & 4.5x12 Veriflex BMS'), RCA 30p, 43m 30d, PDA/PLA min irregs, EF 65%   Diabetes mellitus without complication ([place]    Diverticulosis    Erectile dysfunction due to arterial insufficiency    Fundic gland polyps of stomach, benign 2010   GERD (gastroesophageal reflux disease)    History of elevated PSA    History of kidney stones    Hyperlipidemia    Hypertension    Nocturia    Perennial allergic rhinitis    Peyronie's disease    Skin cancer    Basal and squamous cell cancers, greater than 20   Stroke ([place] 2016    Tubular adenoma of colon 2010    Past Surgical History:  Procedure Laterality Date   COLONOSCOPY  2016   COLONOSCOPY  06/2020   CORONARY ANGIOPLASTY WITH STENT PLACEMENT  02/06/2012   OM2  bare metal    EPIDURAL BLOCK INJECTION  04-2015   Dr BBrien Few  epidural steroids  2007, 2009   X 2 @ cervical &, lumbar)   HERNIA REPAIR     INTRAVASCULAR ULTRASOUND  02/06/2012   Procedure: INTRAVASCULAR ULTRASOUND;  Surgeon: CBurnell Blanks MD;  [facility];  Service: Cardiovascular;;   LEFT HEART CATH AND CORONARY ANGIOGRAPHY N/A 09/30/2019   Procedure: LEFT HEART CATH AND CORONARY ANGIOGRAPHY;  Surgeon: MBurnell Blanks MD;  [place] [place];  Service: Cardiovascular;  Laterality: N/A;   LEFT HEART CATHETERIZATION WITH CORONARY ANGIOGRAM N/A 02/06/2012   Procedure: LEFT HEART CATHETERIZATION WITH CORONARY ANGIOGRAM;  Surgeon: CBurnell Blanks MD;  [facility];  Service: Cardiovascular;  Laterality: N/A;   LUMBAR LAMINECTOMY/DECOMPRESSION MICRODISCECTOMY N/A 10/21/2017   Procedure: Lumbar Two-Three, Lumbar Four-Five Discectomy;  Surgeon: CAshok Pall MD;  [place]  Service: Neurosurgery;  Laterality: N/A;   PERCUTANEOUS CORONARY STENT INTERVENTION (PCI-S)  02/06/2012   Procedure: PERCUTANEOUS CORONARY STENT INTERVENTION (PCI-S);  Surgeon: CBurnell Blanks MD;  [facility];  Service: Cardiovascular;;   septoplasty     TONSILLECTOMY AND ADENOIDECTOMY     TRIGGER FINGER RELEASE  UPPER GASTROINTESTINAL ENDOSCOPY  2011   gastric polyps   VASECTOMY      Current Outpatient Medications  Medication Instructions   albuterol (VENTOLIN HFA) 108 (90 Base) MCG/ACT inhaler 2 puffs, Inhalation, Every 4 hours PRN   aspirin EC 81 mg, Oral, Daily   azelastine (ASTELIN) 0.1 % nasal spray 2 sprays, Each Nare, At bedtime PRN   budesonide-formoterol (SYMBICORT) 160-4.5 MCG/ACT inhaler 2 puffs, Inhalation, 2 times daily   cetirizine  (ZYRTEC) 10 mg, Oral, Daily   chlorhexidine (HIBICLENS) 4 % external liquid Topical, Daily PRN   cyclobenzaprine (FLEXERIL) 5 mg, Oral, 3 times daily PRN   doxycycline (VIBRA-TABS) 100 mg, Oral, 2 times daily   EPINEPHrine (EPIPEN 2-PAK) 0.3 mg/0.3 mL IJ SOAJ injection Use as directed for severe allergic reactions   Fish Oil 1,200 mg, Oral, Daily   fluticasone (FLONASE) 50 MCG/ACT nasal spray One spray each nostril twice a day if needed for stuffy nose.   hydrocortisone 2.5 % cream 1 application , Topical, Daily PRN   ketoconazole (NIZORAL) 2 % shampoo 1 application , Topical, Weekly   metoprolol tartrate (LOPRESSOR) 25 MG tablet TAKE 1/2 TABLET BY MOUTH TWICE A DAY   montelukast (SINGULAIR) 10 mg, Oral, Daily at bedtime   Multiple Vitamin (MULTIVITAMIN WITH MINERALS) TABS tablet 1 tablet, Oral, Daily   olmesartan-hydrochlorothiazide (BENICAR HCT) 20-12.5 MG tablet TAKE 1 TABLET BY MOUTH EVERY DAY   pantoprazole (PROTONIX) 40 mg, Oral, 2 times daily   rosuvastatin (CRESTOR) 40 MG tablet TAKE 1 TABLET BY MOUTH EVERY DAY   tadalafil (CIALIS) 5 mg, Oral, Daily PRN   tamsulosin (FLOMAX) 0.4 mg, Oral, Daily   triamcinolone cream (KENALOG) 0.1 % 1 application , Topical, Daily PRN       Objective:   Physical Exam BP 131/78 (BP Location: Right Arm, Cuff Size: Large)   Pulse 62   Temp 98 F (36.7 C) (Oral)   Resp 16   Ht '5\' 10"'$  (1.778 m)   Wt 228 lb 3.2 oz (103.5 kg)   SpO2 95%   BMI 32.74 kg/m  General:   Well developed, NAD, BMI noted. HEENT:  Normocephalic . Face symmetric, atraumatic Lungs:  CTA B Normal respiratory effort, no intercostal retractions, no accessory muscle use. Heart: RRR,  no murmur.  Lower extremities: no pretibial edema bilaterally  Skin: Not pale. Not jaundice; no pustules @ upper extremities Neurologic:  alert & oriented X3.  Speech normal, gait appropriate for age and unassisted Psych--  Cognition and judgment appear intact.  Cooperative with normal  attention span and concentration.  Behavior appropriate. No anxious or depressed appearing.      Assessment     Assessment  DM (A1C 6.09 September 2020) HTN Hyperlipidemia CV: Dr Angelena Form --CAD --Stroke, seen in a MRI Asthma- per allergist  MS[facility] --DJD knees used to see Dr Len Childs 2016, local injections --Spinal stenosis (neck-back) - Dr Cyndy Freeze dx ~ 2008 via MRI neck, back, had local injections 2008; then 04-2015 GERD BPV, off balance (seen by neuro 2020)  GU: --Elevated PSA -- Dr Jeffie Pollock --Peyronie's  Disease Skin cancer : [facility] Dr Susie Cassette   PLAN: DM: Diet controlled, check A1c. CAD: Asymptomatic, controlling CV RF HTN: BP is very good, continue metoprolol, Benicar, check BMP. Staph infection: see HPI, currently asx, patient is extremely concerned, advised that we all have staph bacteria in our skin and from time to time it can cause problems.  We agreed to use mupirocin in the nostril to decrease the staph burden.  Also, if has  another infection, okay to send doxycycline over the phone however if he has severe symptoms he needs to be seen.  See AVS. Preventive care: Rec to proceed with a booster for COVID-vaccine and a flu shot in the fall. RTC CPX 03/2022  Time spent 34 min, pt concerned about staph skin infex, explained in detail why no reason to be too concern at this point, see above, he verbalized understanding

## 2021-10-02 NOTE — Patient Instructions (Signed)
Apply the ointment to your nostrils (nose) twice a day for 1 week  If you have another infection please call the office for antibiotics. If the infection is severe, you have fever chills and feeling poorly: Go straight to urgent care or ER.   GO TO THE LAB : Get the blood work     [place], [place] Come back for a physical exam by December

## 2021-10-15 ENCOUNTER — Other Ambulatory Visit: Payer: Self-pay | Admitting: Internal Medicine

## 2021-10-22 ENCOUNTER — Ambulatory Visit (INDEPENDENT_AMBULATORY_CARE_PROVIDER_SITE_OTHER): Payer: PPO

## 2021-10-22 DIAGNOSIS — J309 Allergic rhinitis, unspecified: Secondary | ICD-10-CM | POA: Diagnosis not present

## 2021-10-30 ENCOUNTER — Ambulatory Visit (INDEPENDENT_AMBULATORY_CARE_PROVIDER_SITE_OTHER): Payer: PPO

## 2021-10-30 DIAGNOSIS — J309 Allergic rhinitis, unspecified: Secondary | ICD-10-CM | POA: Diagnosis not present

## 2021-11-04 DIAGNOSIS — Z859 Personal history of malignant neoplasm, unspecified: Secondary | ICD-10-CM | POA: Diagnosis not present

## 2021-11-04 DIAGNOSIS — L820 Inflamed seborrheic keratosis: Secondary | ICD-10-CM | POA: Diagnosis not present

## 2021-11-04 DIAGNOSIS — L570 Actinic keratosis: Secondary | ICD-10-CM | POA: Diagnosis not present

## 2021-11-04 DIAGNOSIS — D485 Neoplasm of uncertain behavior of skin: Secondary | ICD-10-CM | POA: Diagnosis not present

## 2021-11-06 ENCOUNTER — Ambulatory Visit (INDEPENDENT_AMBULATORY_CARE_PROVIDER_SITE_OTHER): Payer: PPO

## 2021-11-06 DIAGNOSIS — J309 Allergic rhinitis, unspecified: Secondary | ICD-10-CM

## 2021-11-07 ENCOUNTER — Other Ambulatory Visit: Payer: Self-pay | Admitting: Internal Medicine

## 2021-11-07 ENCOUNTER — Encounter: Payer: Self-pay | Admitting: Internal Medicine

## 2021-11-07 MED ORDER — DOXYCYCLINE HYCLATE 100 MG PO TABS: 100.0000 mg | ORAL_TABLET | Freq: Two times a day (BID) | ORAL | 0 refills | Status: DC

## 2021-11-13 ENCOUNTER — Ambulatory Visit (INDEPENDENT_AMBULATORY_CARE_PROVIDER_SITE_OTHER): Payer: PPO

## 2021-11-13 DIAGNOSIS — J309 Allergic rhinitis, unspecified: Secondary | ICD-10-CM

## 2021-12-11 ENCOUNTER — Ambulatory Visit (INDEPENDENT_AMBULATORY_CARE_PROVIDER_SITE_OTHER): Payer: PPO

## 2021-12-11 DIAGNOSIS — J309 Allergic rhinitis, unspecified: Secondary | ICD-10-CM

## 2021-12-23 ENCOUNTER — Ambulatory Visit (INDEPENDENT_AMBULATORY_CARE_PROVIDER_SITE_OTHER): Payer: PPO

## 2021-12-23 DIAGNOSIS — J309 Allergic rhinitis, unspecified: Secondary | ICD-10-CM

## 2022-01-02 ENCOUNTER — Encounter: Payer: Self-pay | Admitting: Internal Medicine

## 2022-01-02 ENCOUNTER — Ambulatory Visit: Payer: PPO | Admitting: Internal Medicine

## 2022-01-02 VITALS — BP 152/76 | HR 75 | Temp 98.6°F

## 2022-01-02 DIAGNOSIS — L2089 Other atopic dermatitis: Secondary | ICD-10-CM | POA: Diagnosis not present

## 2022-01-02 DIAGNOSIS — J3089 Other allergic rhinitis: Secondary | ICD-10-CM | POA: Diagnosis not present

## 2022-01-02 DIAGNOSIS — K219 Gastro-esophageal reflux disease without esophagitis: Secondary | ICD-10-CM

## 2022-01-02 DIAGNOSIS — J4541 Moderate persistent asthma with (acute) exacerbation: Secondary | ICD-10-CM

## 2022-01-02 MED ORDER — METHYLPREDNISOLONE ACETATE 40 MG/ML IJ SUSP: 40.0000 mg | Freq: Once | INTRAMUSCULAR | Status: DC

## 2022-01-02 NOTE — Patient Instructions (Addendum)
Moderate persistent asthma  -  not well controlled - Continue Symbicort '160mg'$  2 puffs twice a day, increase to 3 puffs twice a day for 2 weeks during flares or until symptoms resolve  - Continue Montelukast '10mg'$  daily  - Use spacer with inhaler, exhale fully before each puff - We will continue with albuterol as needed. - Call us with any issues  - read brochure on Fasenra-an injectable asthma medication that targets eosinophils -40 mg IM methylprednisolone given in clinic, plan to start 20 mg prednisone tomorrow for 4 days and 10 mg on day 5   2. Allergic rhinitis (dust mites, pollens, mold)  Skin test positive to Tree, [employer], DM  Skin test negative to grass, cat, dog, roach, horse, mouse, tobacco leaf  - Continue avoidance measures for dust mite, molds, trees, weed  - Continue  Flonase 37mg 1 spray per nostril twice a day  - Continue Astelin (azelastine) use 1-2 sprays in each nostril up to three times daily as needed  - Continue montelukast '10mg'$  daily. - Use your nose sprays as needed as you are doing.  - continue allergy injections per protocol, do NOT get your injection until asthma flare resolved   3.Atopic dermatitis - Continue with moisturizing as needed.  - Follow up with Dermatology and continue current skin care regimen    4. Gastroesophageal reflux disease - Continue with Protonix daily.  Follow up: in 3 months, sooner if needed.   Thank you so much for letting me partake in your care today.  Don't hesitate to reach out if you have any additional concerns!  ESigurd Sos MD [facility]

## 2022-01-02 NOTE — Progress Notes (Signed)
FOLLOW UP Date of Service/Encounter:  01/02/22   Subjective:  George Alvarez (DOB: 03-Aug-1945) is a 76 y.o. male who returns to the [facility] on 01/02/2022 in re-evaluation of the following: Acute visit for asthma flare History obtained from: chart review and patient.  For Review, LV was on 05/14/21  with Dr. Edison Pace seen for routine follow-up for persistent asthma, atopic dermatitis  and allergic rhinitis.  AIT was started shortly after this visit (06/26/2021).  Last injection 12/23/2021-received 0.05 mL of the gold vial.  He is receiving 1 vial containing tree, weed, mold and dust mite pollen.  Today presents for follow-up due to asthma flare. Typically has an asthma flare once a year, typically in the fall or spring but this varies by year.  In his youth he used to have 2 episodes per year, so this has improved somewhat over the years. Having cough, keeping him up at night for the past 2 nights.  Tested negative for COVID at home yesterday.  Denies fever, rhinorrhea, sore throat or other sick symptoms. He has increased his Symbicort to 3 puffs twice a day, but has not had good sleep in the past few nights. He is going to see his grandkids in [place] this week and is concerned that his current asthma flare will prevent him from doing so.  He has never been on a biologic for his asthma.   Allergies as of 01/02/2022       Reactions   Nifedipine Dermatitis, Rash   edema        Medication List        Accurate as of January 02, 2022 10:22 AM. If you have any questions, ask your nurse or doctor.          albuterol 108 (90 Base) MCG/ACT inhaler Commonly known as: VENTOLIN HFA Inhale 2 puffs into the lungs every 4 (four) hours as needed for wheezing or shortness of breath.   aspirin EC 81 MG tablet Take 1 tablet (81 mg total) by mouth daily.   azelastine 0.1 % nasal spray Commonly known as: ASTELIN Place 2 sprays into both nostrils at bedtime as needed for  rhinitis or allergies.   budesonide-formoterol 160-4.5 MCG/ACT inhaler Commonly known as: Symbicort Inhale 2 puffs into the lungs 2 (two) times daily.   cetirizine 10 MG tablet Commonly known as: ZYRTEC Take 10 mg by mouth daily.   chlorhexidine 4 % external liquid Commonly known as: Hibiclens Apply topically daily as needed.   cyclobenzaprine 5 MG tablet Commonly known as: FLEXERIL Take 1 tablet (5 mg total) by mouth 3 (three) times daily as needed for muscle spasms.   doxycycline 100 MG tablet Commonly known as: VIBRA-TABS Take 1 tablet (100 mg total) by mouth 2 (two) times daily.   EPINEPHrine 0.3 mg/0.3 mL Soaj injection Commonly known as: EpiPen 2-Pak Use as directed for severe allergic reactions   Fish Oil 1200 MG Caps Take 1,200 mg by mouth daily.   fluticasone 50 MCG/ACT nasal spray Commonly known as: FLONASE One spray each nostril twice a day if needed for stuffy nose.   hydrocortisone 2.5 % cream Apply 1 application topically daily as needed (Eczema).   ketoconazole 2 % shampoo Commonly known as: NIZORAL Apply 1 application topically once a week.   metoprolol tartrate 25 MG tablet Commonly known as: LOPRESSOR TAKE 1/2 TABLET BY MOUTH TWICE A DAY   montelukast 10 MG tablet Commonly known as: SINGULAIR Take 1 tablet (10 mg total)  by mouth at bedtime.   multivitamin with minerals Tabs tablet Take 1 tablet by mouth daily.   mupirocin ointment 2 % Commonly known as: BACTROBAN Apply 1 Application topically 2 (two) times daily.   olmesartan-hydrochlorothiazide 20-12.5 MG tablet Commonly known as: BENICAR HCT TAKE 1 TABLET BY MOUTH EVERY DAY   pantoprazole 40 MG tablet Commonly known as: PROTONIX Take 1 tablet (40 mg total) by mouth 2 (two) times daily.   rosuvastatin 40 MG tablet Commonly known as: CRESTOR TAKE 1 TABLET BY MOUTH EVERY DAY   tadalafil 5 MG tablet Commonly known as: CIALIS Take 5 mg by mouth daily as needed.   tamsulosin 0.4 MG  Caps capsule Commonly known as: FLOMAX Take 1 capsule (0.4 mg total) by mouth daily.   triamcinolone cream 0.1 % Commonly known as: KENALOG Apply 1 application topically daily as needed (Eczema).       Past Medical History:  Diagnosis Date   Allergy    seasonal   Anal fissure    Arthritis    Asthma, chronic 06/16/2007   Qualifier: Diagnosis of  By: Linna Darner MD, Gwyndolyn Saxon   Onset:as child Triggers (environmental, infectious, allergic): all, mainly environmental triggers Rescue inhaler YOV:ZCHYIF Maintenance medications/ response:Singulair,Qvar Smoking history:never Family history pulmonary disease: no     Basal cell carcinoma of chest wall 2016   1.8 cm, treated with electrodessication and currettage   Benign paroxysmal positional vertigo 11/19/2012   Diagnosed at [facility], [place]  Physical therapy appointment pending    BPH (benign prostatic hyperplasia)    With urinary obstruction   Coronary artery disease    a. 02/2012 Cath/PCI: LM 10, LAD min irregs, LCX large, OM1 sm, 40 ost, OM2 95p (5.0x16 Veriflex & 4.5x12 Veriflex BMS'), RCA 30p, 62m 30d, PDA/PLA min irregs, EF 65%   Diabetes mellitus without complication ([place]    Diverticulosis    Erectile dysfunction due to arterial insufficiency    Fundic gland polyps of stomach, benign 2010   GERD (gastroesophageal reflux disease)    History of elevated PSA    History of kidney stones    Hyperlipidemia    Hypertension    Nocturia    Perennial allergic rhinitis    Peyronie's disease    Skin cancer    Basal and squamous cell cancers, greater than 20   Stroke ([place] 2016   Tubular adenoma of colon 2010   Past Surgical History:  Procedure Laterality Date   COLONOSCOPY  2016   COLONOSCOPY  06/2020   CORONARY ANGIOPLASTY WITH STENT PLACEMENT  02/06/2012   OM2  bare metal    EPIDURAL BLOCK INJECTION  04-2015   Dr BBrien Few  epidural steroids  2007, 2009   X 2 @ cervical &, lumbar)   HERNIA REPAIR     INTRAVASCULAR  ULTRASOUND  02/06/2012   Procedure: INTRAVASCULAR ULTRASOUND;  Surgeon: CBurnell Blanks MD;  [facility];  Service: Cardiovascular;;   LEFT HEART CATH AND CORONARY ANGIOGRAPHY N/A 09/30/2019   Procedure: LEFT HEART CATH AND CORONARY ANGIOGRAPHY;  Surgeon: MBurnell Blanks MD;  [facility];  Service: Cardiovascular;  Laterality: N/A;   LEFT HEART CATHETERIZATION WITH CORONARY ANGIOGRAM N/A 02/06/2012   Procedure: LEFT HEART CATHETERIZATION WITH CORONARY ANGIOGRAM;  Surgeon: CBurnell Blanks MD;  [facility];  Service: Cardiovascular;  Laterality: N/A;   LUMBAR LAMINECTOMY/DECOMPRESSION MICRODISCECTOMY N/A 10/21/2017   Procedure: Lumbar Two-Three, Lumbar Four-Five Discectomy;  Surgeon: CAshok Pall MD;  [facility];  Service: Neurosurgery;  Laterality: N/A;   PERCUTANEOUS CORONARY STENT INTERVENTION (PCI-S)  02/06/2012   Procedure: PERCUTANEOUS CORONARY STENT INTERVENTION (PCI-S);  Surgeon: [name], MD;  Location: [facility] CATH LAB;  Service: Cardiovascular;;   septoplasty     TONSILLECTOMY AND ADENOIDECTOMY     TRIGGER FINGER RELEASE     UPPER GASTROINTESTINAL ENDOSCOPY  2011   gastric polyps   VASECTOMY     Otherwise, there have been no changes to his past medical history, surgical history, family history, or social history.  ROS: All others negative except as noted per HPI.   Objective:  There were no vitals taken for this visit. There is no height or weight on file to calculate BMI. Physical Exam: General Appearance:  Alert, cooperative, no distress, appears stated age  Head:  Normocephalic, without obvious abnormality, atraumatic  Eyes:  Conjunctiva clear, EOM's intact  Nose: Nares normal, normal mucosa, no visible anterior polyps, and septum midline  Throat: Lips, tongue normal; teeth and gums normal, normal posterior oropharynx  Neck: Supple, symmetrical  Lungs:   Prolonged expiratory phase with faint end  expiratory wheezing , Respirations unlabored, intermittent dry coughing  Heart:  regular rate and rhythm and no murmur, Appears well perfused  Extremities: No edema  Skin: Skin color, texture, turgor normal, no rashes or lesions on visualized portions of skin  Neurologic: No gross deficits   Reviewed: Most recent [place] on 04/03/2021-300  Spirometry:  Tracings reviewed. His effort: Good reproducible efforts. FVC:3.15L FEV1: 2.31L, 75% predicted FEV1/FVC ratio: 0.73 Interpretation: Spirometry consistent with normal pattern.  Please see scanned spirometry results for details.  Assessment/Plan   Moderate persistent asthma  -  not well controlled, with acute exacerbation - Continue Symbicort '160mg'$  2 puffs twice a day , increase to 3 puffs twice a day for 2 weeks during flares or until symptoms resolve - Continue Montelukast '10mg'$  daily  - Use spacer with inhaler, exhale fully before each puff - We will continue with albuterol as needed. - Call us with any issues  - read brochure on Fasenra-an injectable asthma medication that targets eosinophils, message sent to [facility] for cost verification -40 mg IM methylprednisolone given in clinic, plan to start 20 mg prednisone tomorrow for 4 days and 10 mg on day 5   2. Allergic rhinitis (dust mites, pollens, mold)-stable Skin test positive to Tree, [employer], DM  Skin test negative to grass, cat, dog, roach, horse, mouse, tobacco leaf  - Continue avoidance measures for dust mite, molds, trees, weed  - Continue  Flonase 63mg 1 spray per nostril twice a day  - Continue Astelin (azelastine) use 1-2 sprays in each nostril up to three times daily as needed  - Continue montelukast '10mg'$  daily. - Use your nose sprays as needed as you are doing.  - continue allergy injections per protocol, do NOT get your injection until asthma flare resolved   3.Atopic dermatitis-stable - Continue with moisturizing as needed.  - Follow up with Dermatology and continue  current skin care regimen    4. Gastroesophageal reflux disease-stable - Continue with Protonix daily.  Follow up: in 3 months, sooner if needed.   ESigurd Sos MD  [facility] [place]

## 2022-01-13 ENCOUNTER — Encounter: Payer: Self-pay | Admitting: Internal Medicine

## 2022-01-13 ENCOUNTER — Other Ambulatory Visit: Payer: Self-pay | Admitting: Internal Medicine

## 2022-01-15 ENCOUNTER — Encounter: Payer: Self-pay | Admitting: Internal Medicine

## 2022-01-15 ENCOUNTER — Ambulatory Visit (INDEPENDENT_AMBULATORY_CARE_PROVIDER_SITE_OTHER): Payer: PPO | Admitting: Internal Medicine

## 2022-01-15 VITALS — BP 126/76 | HR 60 | Temp 98.1°F | Resp 18 | Ht 70.0 in | Wt 228.2 lb

## 2022-01-15 DIAGNOSIS — R252 Cramp and spasm: Secondary | ICD-10-CM

## 2022-01-15 DIAGNOSIS — L089 Local infection of the skin and subcutaneous tissue, unspecified: Secondary | ICD-10-CM

## 2022-01-15 DIAGNOSIS — I10 Essential (primary) hypertension: Secondary | ICD-10-CM | POA: Diagnosis not present

## 2022-01-15 LAB — BASIC METABOLIC PANEL
BUN: 20 mg/dL (ref 6–23)
CO2: 28 mEq/L (ref 19–32)
Calcium: 9.5 mg/dL (ref 8.4–10.5)
Chloride: 101 mEq/L (ref 96–112)
Creatinine, Ser: 1.22 mg/dL (ref 0.40–1.50)
GFR: 58.66 mL/min — ABNORMAL LOW (ref >60.00)
Glucose, Bld: 110 mg/dL — ABNORMAL HIGH (ref 70–99)
Potassium: 4.1 mEq/L (ref 3.5–5.1)
Sodium: 137 mEq/L (ref 135–145)

## 2022-01-15 LAB — MAGNESIUM: Magnesium: 2 mg/dL (ref 1.5–2.5)

## 2022-01-15 MED ORDER — DOXYCYCLINE HYCLATE 100 MG PO TABS: 100.0000 mg | ORAL_TABLET | Freq: Two times a day (BID) | ORAL | 0 refills | Status: DC

## 2022-01-15 NOTE — Progress Notes (Signed)
Subjective:    Patient ID: George Alvarez, male    DOB: [birth year], 76 y.o.   MRN: [id number]  DOS:  01/15/2022 Type of visit - description: Acute, several concerns  Ongoing, on and off skin infections, boils. The latest episode started few days ago at the neck.  Also likes my opinion in regards biological for the treatment of asthma.  Also having cramps, random events, at the legs or arms. Denies any neck or back pain.   Review of Systems See above   Past Medical History:  Diagnosis Date   Allergy    seasonal   Anal fissure    Arthritis    Asthma, chronic 06/16/2007   Qualifier: Diagnosis of  By: Linna Darner MD, Gwyndolyn Saxon   Onset:as child Triggers (environmental, infectious, allergic): all, mainly environmental triggers Rescue inhaler MBW:GYKZLD Maintenance medications/ response:Singulair,Qvar Smoking history:never Family history pulmonary disease: no     Basal cell carcinoma of chest wall 2016   1.8 cm, treated with electrodessication and currettage   Benign paroxysmal positional vertigo 11/19/2012   Diagnosed at [facility], [place]  Physical therapy appointment pending    BPH (benign prostatic hyperplasia)    With urinary obstruction   Coronary artery disease    a. 02/2012 Cath/PCI: LM 10, LAD min irregs, LCX large, OM1 sm, 40 ost, OM2 95p (5.0x16 Veriflex & 4.5x12 Veriflex BMS'), RCA 30p, 28m 30d, PDA/PLA min irregs, EF 65%   Diabetes mellitus without complication ([place]    Diverticulosis    Erectile dysfunction due to arterial insufficiency    Fundic gland polyps of stomach, benign 2010   GERD (gastroesophageal reflux disease)    History of elevated PSA    History of kidney stones    Hyperlipidemia    Hypertension    Nocturia    Perennial allergic rhinitis    Peyronie's disease    Skin cancer    Basal and squamous cell cancers, greater than 20   [place]   Tubular adenoma of colon 2010    Past Surgical History:  Procedure Laterality Date    COLONOSCOPY  2016   COLONOSCOPY  06/2020   CORONARY ANGIOPLASTY WITH STENT PLACEMENT  02/06/2012   OM2  bare metal    EPIDURAL BLOCK INJECTION  04-2015   Dr BBrien Few  epidural steroids  2007, 2009   X 2 @ cervical &, lumbar)   HERNIA REPAIR     INTRAVASCULAR ULTRASOUND  02/06/2012   Procedure: INTRAVASCULAR ULTRASOUND;  Surgeon: CBurnell Blanks MD;  [facility];  Service: Cardiovascular;;   LEFT HEART CATH AND CORONARY ANGIOGRAPHY N/A 09/30/2019   Procedure: LEFT HEART CATH AND CORONARY ANGIOGRAPHY;  Surgeon: MBurnell Blanks MD;  [place] [facility];  Service: Cardiovascular;  Laterality: N/A;   LEFT HEART CATHETERIZATION WITH CORONARY ANGIOGRAM N/A 02/06/2012   Procedure: LEFT HEART CATHETERIZATION WITH CORONARY ANGIOGRAM;  Surgeon: CBurnell Blanks MD;  [facility];  Service: Cardiovascular;  Laterality: N/A;   LUMBAR LAMINECTOMY/DECOMPRESSION MICRODISCECTOMY N/A 10/21/2017   Procedure: Lumbar Two-Three, Lumbar Four-Five Discectomy;  Surgeon: CAshok Pall MD;  [place]  Service: Neurosurgery;  Laterality: N/A;   PERCUTANEOUS CORONARY STENT INTERVENTION (PCI-S)  02/06/2012   Procedure: PERCUTANEOUS CORONARY STENT INTERVENTION (PCI-S);  Surgeon: CBurnell Blanks MD;  [facility];  Service: Cardiovascular;;   septoplasty     TONSILLECTOMY AND ADENOIDECTOMY     TRIGGER FINGER RELEASE     UPPER GASTROINTESTINAL ENDOSCOPY  2011   gastric polyps   VASECTOMY      Current Outpatient Medications  Medication Instructions   albuterol (VENTOLIN HFA) 108 (90 Base) MCG/ACT inhaler 2 puffs, Inhalation, Every 4 hours PRN   aspirin EC 81 mg, Oral, Daily   azelastine (ASTELIN) 0.1 % nasal spray 2 sprays, Each Nare, At bedtime PRN   budesonide-formoterol (SYMBICORT) 160-4.5 MCG/ACT inhaler 2 puffs, Inhalation, 2 times daily   cetirizine (ZYRTEC) 10 mg, Oral, Daily   chlorhexidine (HIBICLENS) 4 % external liquid Topical, Daily  PRN   cyclobenzaprine (FLEXERIL) 5 mg, Oral, 3 times daily PRN   EPINEPHrine (EPIPEN 2-PAK) 0.3 mg/0.3 mL IJ SOAJ injection Use as directed for severe allergic reactions   Fish Oil 1,200 mg, Oral, Daily   fluticasone (FLONASE) 50 MCG/ACT nasal spray One spray each nostril twice a day if needed for stuffy nose.   hydrocortisone 2.5 % cream 1 application , Topical, Daily PRN   ketoconazole (NIZORAL) 2 % shampoo 1 application , Topical, Weekly   metoprolol tartrate (LOPRESSOR) 25 MG tablet TAKE 1/2 TABLET BY MOUTH TWICE A DAY   montelukast (SINGULAIR) 10 mg, Oral, Daily at bedtime   Multiple Vitamin (MULTIVITAMIN WITH MINERALS) TABS tablet 1 tablet, Oral, Daily   mupirocin ointment (BACTROBAN) 2 % 1 Application, Topical, 2 times daily   olmesartan-hydrochlorothiazide (BENICAR HCT) 20-12.5 MG tablet 1 tablet, Oral, Daily   pantoprazole (PROTONIX) 40 mg, Oral, 2 times daily   rosuvastatin (CRESTOR) 40 MG tablet TAKE 1 TABLET BY MOUTH EVERY DAY   tadalafil (CIALIS) 5 mg, Oral, Daily PRN   tamsulosin (FLOMAX) 0.4 mg, Oral, Daily   triamcinolone cream (KENALOG) 0.1 % 1 application , Topical, Daily PRN       Objective:   Physical Exam BP 126/76   Pulse 60   Temp 98.1 F (36.7 C) (Oral)   Resp 18   Ht '5\' 10"'$  (1.778 m)   Wt 228 lb 4 oz (103.5 kg)   SpO2 93%   BMI 32.75 kg/m  General:   Well developed, NAD, BMI noted. HEENT:  Normocephalic . Face symmetric, atraumatic Skin: Left posterior neck, has a 1.5 cm induration without a opening.  Yellow discharge is easily obtained.   Neurologic:  alert & oriented X3.  Speech normal, gait appropriate for age and unassisted Psych--  Cognition and judgment appear intact.  Cooperative with normal attention span and concentration.  Behavior appropriate. No anxious or depressed appearing.      Assessment     Assessment  DM (A1C 6.09 September 2020) HTN Hyperlipidemia CV: Dr Angelena Form --CAD --Stroke, seen in a MRI Asthma- per allergist   MSK --DJD knees used to see Dr Len Childs 2016, local injections --Spinal stenosis (neck-back) - Dr Cyndy Freeze dx ~ 2008 via MRI neck, back, had local injections 2008; then 04-2015 GERD BPV, off balance (seen by neuro 2020)  GU: --Elevated PSA -- Dr Jeffie Pollock --Peyronie's  Disease Skin cancer : [facility] Dr Susie Cassette   PLAN: Recurrent staph infections. Having skin staph infections frequently this year, patient is extremely concerned. Currently has a boil at the left neck, discharge was obtained and sent for culture. Has a number of questions that I tried to answer to the best of my ability. He wonders if this would cause bloodstream infection, that would be uncommon but not impossible.  We will need to seek medical attention if he ever has malaise, fever or chills. For the current infection, will prescribe doxycycline For prevention we will do again: nasal mupirocin  and chlorhexidine.  See AVS. The patient still has doubts and concerns, thus refer  to ID for further advice. Asthma: Recently had a flareup, saw his allergist, they noted increase eosinophils and suggested possibly biological treatments, he asked my opinion.  I made clear I am not a specialist however if he is relatively well controlled with traditional medications, I do not see urgent need to use biologicals. Cramps: Having random cramps as described above, on diuretic, check a BMP and magnesium HTN: Well-controlled on Benicar HCT.  Checking labs. RTC CPX 03/2022     Extensive discussion about recurrent staph infections, careful discussion about prevention.  Eventually the patient was not completely satisfied and he was still concerned thus referred to ID.  level 4

## 2022-01-15 NOTE — Patient Instructions (Addendum)
For your current staph infection: Take doxycycline for 1 week  To prevent future infections:  Mupirocin in both sides of the nose twice daily for 1 week  Apply chlorhexidine (HIBICLENS) wash daily for 1 week. Apply no better with skin from neck to toes, especially in the skin folds.  Leave them for a minute then rinse with soap and water.  We will refer you to the infectious diseases doctors.     GO TO THE LAB : Get the blood work     [place], [place] Come back for   for a physical exam by December 2023

## 2022-01-16 NOTE — Assessment & Plan Note (Signed)
Recurrent staph infections. Having skin staph infections frequently this year, patient is extremely concerned. Currently has a boil at the left neck, discharge was obtained and sent for culture. Has a number of questions that I tried to answer to the best of my ability. He wonders if this would cause bloodstream infection, that would be uncommon but not impossible.  We will need to seek medical attention if he ever has malaise, fever or chills. For the current infection, will prescribe doxycycline For prevention we will do again: nasal mupirocin and chlorhexidine.  See AVS. The patient still has doubts and concerns, thus refer  to ID for further advice. Asthma: Recently had a flareup, saw his allergist, they noted increase eosinophils and suggested possibly biological treatments, he asked my opinion.  I made clear I am not a specialist however if he is relatively well controlled with traditional medications, I do not see urgent need to use biologicals. Cramps: Having random cramps as described above, on diuretic, check a BMP and magnesium HTN: Well-controlled on Benicar HCT.  Checking labs. RTC CPX 03/2022

## 2022-01-17 ENCOUNTER — Encounter: Payer: Self-pay | Admitting: Internal Medicine

## 2022-01-18 LAB — WOUND CULTURE
MICRO NUMBER:: 14037288
SPECIMEN QUALITY:: ADEQUATE

## 2022-01-21 ENCOUNTER — Other Ambulatory Visit (HOSPITAL_COMMUNITY): Payer: Self-pay

## 2022-01-23 ENCOUNTER — Ambulatory Visit: Payer: PPO | Admitting: Internal Medicine

## 2022-01-23 ENCOUNTER — Other Ambulatory Visit: Payer: Self-pay

## 2022-01-23 ENCOUNTER — Encounter: Payer: Self-pay | Admitting: Internal Medicine

## 2022-01-23 VITALS — BP 131/82 | HR 67 | Temp 98.0°F | Ht 70.0 in | Wt 227.0 lb

## 2022-01-23 DIAGNOSIS — L0390 Cellulitis, unspecified: Secondary | ICD-10-CM

## 2022-01-23 NOTE — Progress Notes (Signed)
[facility] for Infectious Disease  Reason for Consult:recurrent skin boils Referring Provider: Larose Kells    Patient Active Problem List   Diagnosis Date Noted   TMJ (temporomandibular joint disorder) 09/17/2021   MRSA (methicillin resistant Staphylococcus aureus) infection 09/17/2021   Rash 08/16/2021   Diabetes mellitus without complication ([place]) 58/12/9831   Other atopic dermatitis 11/22/2019   Chest pain of uncertain etiology    Viral upper respiratory tract infection with cough 07/06/2017   Current use of beta blocker 08/09/2015   Moderate persistent asthma without complication 82/50/5397   Other allergic rhinitis 02/20/2015   PCP NOTES >>> 01/11/2015   Cerebellar infarct ([place]) 06/26/2014   Annual physical exam 02/20/2014   Benign paroxysmal positional vertigo 11/19/2012   CAD (coronary artery disease) 02/07/2012   Gastroesophageal reflux disease without esophagitis 01/01/2009   SKIN CANCER, HX OF 12/28/2007   Hyperlipidemia 06/16/2007   Essential hypertension 06/16/2007   Asthma, chronic 06/16/2007   Spinal stenosis 03/01/2007   NEPHROLITHIASIS, HX OF 05/14/2006      HPI: PEARSON PICOU is a 76 y.o. male asthma, eczema referred here by pcp for recurrent boils  Patient has recurrent skin abscesses since 08/2021 These lesions would be on different of his body including trunk, extremities, neck. They are not consistently at the same location   He went to the ed 01/15/22 for I&D and this was the only time. He was given doxycycline at that time for a week  Previously he also has doxycycline 4 times since 08/2021  He did the decolonization x2 (June and this last time 01/2022)  He had no fever/chill  Right now the lesion on the neck had resolved.   Lives with his wife. His wife doesn't have issue with skin rash  He gets allergy shots but otherwise no injection or medication for immunomodulation  He has eczema but no current lesions now  Review of  Systems: ROS All other ros negative       Past Medical History:  Diagnosis Date   Allergy    seasonal   Anal fissure    Arthritis    Asthma, chronic 06/16/2007   Qualifier: Diagnosis of  By: Linna Darner MD, Gwyndolyn Saxon   Onset:as child Triggers (environmental, infectious, allergic): all, mainly environmental triggers Rescue inhaler QBH:ALPFXT Maintenance medications/ response:Singulair,Qvar Smoking history:never Family history pulmonary disease: no     Basal cell carcinoma of chest wall 2016   1.8 cm, treated with electrodessication and currettage   Benign paroxysmal positional vertigo 11/19/2012   Diagnosed at [facility], [place]  Physical therapy appointment pending    BPH (benign prostatic hyperplasia)    With urinary obstruction   Coronary artery disease    a. 02/2012 Cath/PCI: LM 10, LAD min irregs, LCX large, OM1 sm, 40 ost, OM2 95p (5.0x16 Veriflex & 4.5x12 Veriflex BMS'), RCA 30p, 40m 30d, PDA/PLA min irregs, EF 65%   Diabetes mellitus without complication ([place]    Diverticulosis    Erectile dysfunction due to arterial insufficiency    Fundic gland polyps of stomach, benign 2010   GERD (gastroesophageal reflux disease)    History of elevated PSA    History of kidney stones    Hyperlipidemia    Hypertension    Nocturia    Perennial allergic rhinitis    Peyronie's disease    Skin cancer    Basal and squamous cell cancers, greater than 20   [place]   Tubular adenoma of colon  2010    Social History   Tobacco Use   Smoking status: Never   Smokeless tobacco: Never  Vaping Use   Vaping Use: Never used  Substance Use Topics   Alcohol use: Not Currently    Comment: Wine occasionally   Drug use: No    Family History  Problem Relation Age of Onset   Heart attack Father 38       died @ 13   Heart disease Mother    Breast cancer Mother    Esophageal cancer Mother        Barrett's   Heart attack Maternal Grandmother        after 22   Dementia  Maternal Grandmother        CVA   Breast cancer Maternal Grandmother    Diabetes Maternal Grandmother    Stroke Maternal Grandmother        in 102s   Heart attack Paternal Uncle 23   Breast cancer Maternal Aunt    Colon cancer Neg Hx    Prostate cancer Neg Hx    Colon polyps Neg Hx    Rectal cancer Neg Hx    Stomach cancer Neg Hx     Allergies  Allergen Reactions   Nifedipine Dermatitis and Rash    edema    OBJECTIVE: Vitals:   01/23/22 1001  BP: 131/82  Pulse: 67  Temp: 98 F (36.7 C)  TempSrc: Oral  SpO2: 98%  Weight: 227 lb (103 kg)  Height: '5\' 10"'$  (1.778 m)   Body mass index is 32.57 kg/m.   Physical Exam General/constitutional: no distress, pleasant HEENT: Normocephalic, PER, Conj Clear, EOMI, Oropharynx clear Neck supple CV: rrr no mrg Lungs: clear to auscultation, normal respiratory effort Abd: Soft, Nontender Ext: no edema Skin: No Rash Neuro: nonfocal MSK: no peripheral joint swelling/tenderness/warmth; back spines nontender   Lab:  Microbiology:  Serology:  Imaging:   Assessment/plan: Problem List Items Addressed This Visit   None Visit Diagnoses     Recurrent cellulitis    -  Primary         Discuss the pathogenesis/risk of recurrent skin infection with staph aureus/mrsa. He has had mssa on culture only Discuss the data in terms of decolonization for inpatient and outpatient setting. While inpatient does work for reducing line/device/wound infection, it is unclear in terms of outpatient recurrence of skin infection  For now I have advised him to continue monthly decolonization with muciporin/chlorhexidine. I will ask his wife to do the same to time with him, and also for them to wash everything in their house and bleach down surface twice weekly during decolonization procedure  Tapered procedure for the decolonization discussed for the next 2 years   Call me if another boil episode occur otherwise, we can do systemic abx     F/u as needed   I have spent a total of 65 minutes of face-to-face and non-face-to-face time, excluding clinical staff time, preparing to see patient, ordering tests and/or medications, and provide counseling the patient     Follow-up: Return in about 6 months (around 07/25/2022).  Jabier Mutton, [place] [facility] 01/23/2022, 10:07 AM

## 2022-01-23 NOTE — Patient Instructions (Signed)
Start another 5 days of decolonization. Ask your wife to do the same and time it so both are doing at same time   Wash in hot laundry all your bedsheets/clothing twice the week of decolonization and also bleach down frequently used surfaces at home   Repeat this procedure monthly for now for the next 3 months. If no episodes the next 3 months, you can do this every 3 months twice, and then 6 months twice and then maybe yearly (if even needed). Each time is 5 days  If concern for outbreak let us know  Follow up with me in 6 months to see how things go otherwise

## 2022-01-25 ENCOUNTER — Encounter: Payer: Self-pay | Admitting: Internal Medicine

## 2022-02-11 ENCOUNTER — Other Ambulatory Visit: Payer: Self-pay | Admitting: Internal Medicine

## 2022-02-13 ENCOUNTER — Ambulatory Visit: Payer: PPO | Admitting: Internal Medicine

## 2022-02-24 ENCOUNTER — Ambulatory Visit: Payer: PPO | Admitting: Internal Medicine

## 2022-02-24 DIAGNOSIS — J3089 Other allergic rhinitis: Secondary | ICD-10-CM

## 2022-02-24 DIAGNOSIS — J4541 Moderate persistent asthma with (acute) exacerbation: Secondary | ICD-10-CM

## 2022-02-24 DIAGNOSIS — J4540 Moderate persistent asthma, uncomplicated: Secondary | ICD-10-CM | POA: Diagnosis not present

## 2022-02-24 DIAGNOSIS — J302 Other seasonal allergic rhinitis: Secondary | ICD-10-CM

## 2022-02-24 DIAGNOSIS — H1045 Other chronic allergic conjunctivitis: Secondary | ICD-10-CM | POA: Diagnosis not present

## 2022-02-24 DIAGNOSIS — L2084 Intrinsic (allergic) eczema: Secondary | ICD-10-CM

## 2022-02-24 MED ORDER — METHYLPREDNISOLONE ACETATE 40 MG/ML IJ SUSP
40.0000 mg | Freq: Once | INTRAMUSCULAR | Status: AC
  Administered 2022-02-24: 40 mg via INTRAMUSCULAR

## 2022-02-24 NOTE — Patient Instructions (Addendum)
Moderate persistent asthma  -  not well controlled Breathing tests: significant inflammation in your lungs  - Continue Symbicort '160mg'$  2 puffs twice a day, increase to 3 puffs twice a day for 2 weeks during flares or until symptoms resolve  - Continue Montelukast '10mg'$  daily  - Use spacer with inhaler, exhale fully before each puff - We will continue with albuterol as needed. - Call us with any issues  - I have emailed Tammy our Biologic coordinator to assess ccoveraged for faesenra  -40 mg IM methylprednisolone given in clinic, plan to start 20 mg prednisone tomorrow for 4 days and 10 mg on day 5   2. Allergic rhinitis (dust mites, pollens, mold)  Skin test positive to Tree, [employer], DM  Skin test negative to grass, cat, dog, roach, horse, mouse, tobacco leaf  - Continue avoidance measures for dust mite, molds, trees, weed  - Continue  Flonase 12mg 1 spray per nostril twice a day  - Continue Astelin (azelastine) use 1-2 sprays in each nostril up to three times daily as needed  - Continue montelukast '10mg'$  daily. - Use your nose sprays as needed as you are doing.  - continue allergy injections per protocol, do NOT get your injection until asthma flare resolved   3.Atopic dermatitis - Continue with moisturizing as needed.  - Follow up with Dermatology and continue current skin care regimen    4. Gastroesophageal reflux disease - Continue with Protonix daily.  Follow up: Keep previously scheduled follow up appointment.   Thank you so much for letting me partake in your care today.  Don't hesitate to reach out if you have any additional concerns!  ERoney Marion MD  [facility] [place]

## 2022-02-24 NOTE — Addendum Note (Signed)
Addended by: Felipa Emory on: 02/24/2022 04:47 PM   Modules accepted: Orders
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
## 2022-02-25 ENCOUNTER — Encounter: Payer: Self-pay | Admitting: Internal Medicine

## 2022-02-26 ENCOUNTER — Other Ambulatory Visit: Payer: Self-pay | Admitting: *Deleted

## 2022-02-26 MED ORDER — AZITHROMYCIN 250 MG PO TABS: ORAL_TABLET | ORAL | 0 refills | Status: DC

## 2022-02-26 MED ORDER — BUDESONIDE-FORMOTEROL FUMARATE 160-4.5 MCG/ACT IN AERO: 2.0000 | INHALATION_SPRAY | Freq: Two times a day (BID) | RESPIRATORY_TRACT | 5 refills | Status: DC

## 2022-02-26 NOTE — Telephone Encounter (Signed)
We can definitely try some antibiotics at this point.  We sent in the symbicort.  So sorry about the mistake.  We will send in a z-pack now.

## 2022-02-26 NOTE — Addendum Note (Signed)
Addended by: Katherina Right D on: 02/26/2022 04:30 PM   Modules accepted: Orders

## 2022-03-11 ENCOUNTER — Encounter: Payer: Self-pay | Admitting: Internal Medicine

## 2022-03-11 ENCOUNTER — Ambulatory Visit: Payer: PPO | Admitting: Internal Medicine

## 2022-03-11 VITALS — BP 118/70 | HR 63 | Temp 99.3°F | Resp 18

## 2022-03-11 DIAGNOSIS — J0181 Other acute recurrent sinusitis: Secondary | ICD-10-CM | POA: Diagnosis not present

## 2022-03-11 DIAGNOSIS — L2084 Intrinsic (allergic) eczema: Secondary | ICD-10-CM

## 2022-03-11 DIAGNOSIS — A4902 Methicillin resistant Staphylococcus aureus infection, unspecified site: Secondary | ICD-10-CM

## 2022-03-11 DIAGNOSIS — J4550 Severe persistent asthma, uncomplicated: Secondary | ICD-10-CM | POA: Diagnosis not present

## 2022-03-11 DIAGNOSIS — K219 Gastro-esophageal reflux disease without esophagitis: Secondary | ICD-10-CM

## 2022-03-11 DIAGNOSIS — J3089 Other allergic rhinitis: Secondary | ICD-10-CM

## 2022-03-11 MED ORDER — BREZTRI AEROSPHERE 160-9-4.8 MCG/ACT IN AERO: 2.0000 | INHALATION_SPRAY | Freq: Two times a day (BID) | RESPIRATORY_TRACT | 5 refills | Status: DC

## 2022-03-11 MED ORDER — BENRALIZUMAB 30 MG/ML ~~LOC~~ SOSY
30.0000 mg | PREFILLED_SYRINGE | Freq: Once | SUBCUTANEOUS | Status: AC
  Administered 2022-03-11 – 2022-05-06 (×3): 30 mg via SUBCUTANEOUS

## 2022-03-11 MED ORDER — AZELASTINE HCL 0.1 % NA SOLN: 2.0000 | Freq: Every evening | NASAL | 5 refills | Status: DC | PRN

## 2022-03-11 MED ORDER — DOXYCYCLINE MONOHYDRATE 100 MG PO TABS: 100.0000 mg | ORAL_TABLET | Freq: Two times a day (BID) | ORAL | 0 refills | Status: AC

## 2022-03-11 NOTE — Progress Notes (Signed)
Follow Up Note  RE: George Alvarez MRN: [id number] DOB: 01-30-1946 Date of Office Visit: 03/11/2022  Referring provider: Colon Branch, MD Primary care provider: Colon Branch, MD  Chief Complaint: Other ([place] visit,  pt states he is still wheezing) and Wheezing  History of Present Illness: I had the pleasure of seeing George Alvarez for a follow up visit at the [facility] [place] [facility] [place] on 03/11/2022. He is a 76 y.o. male, who is being followed for persistent asthma, allergic rhinitis, atopic dermaitis and GERD . His previous allergy office visit was on 02/24/22 with Dr. Edison Pace. Today is a regular follow up visit.  History obtained from patient, chart review .  At last visit he was treated with 40 mg of IM prednisolone, prednisone taper for asthma flare.  FEV1 2.06 L, 67%.  Contacted clinic on 02/25/2022 for persistent symptoms.  Z-Pak was placed.  He was also resubmitted for fasenra. He reports not having contact in regards to [place] yet.   Today he reports initial improvement in some symptoms  He still has "wheezy cough" which is exacerbated by use of inhalers and talking. Denies any productive cough.  Wife has commented on "heavy breathing"  He is exercising but is limited by coughing.   He has increase Symbicort to 4 puffs twice a day without good response. Denies any fevers but has had some sinus tenderness. Does report a history of MRSA skin infections which is resolved with doxycycline and topical mupirocin.  Assessment and Plan: George Alvarez is a 76 y.o. male with: Severe persistent asthma without complication - Plan: Spirometry with Graph  Other acute recurrent sinusitis  Other allergic rhinitis  Intrinsic atopic dermatitis  Gastroesophageal reflux disease without esophagitis  MRSA (methicillin resistant Staphylococcus aureus) infection Plan: Patient Instructions  Severe persistent asthma  -   Breathing tests: showed persistent inflammation in your lungs that is  not fully reversible  - change symbicort to Breztri 2 puffs twice a day (sample given today) -Start Fasenra 30 mg injected under the skin every 4 weeks for first 3 injections  -Sample given today - Continue Montelukast '10mg'$  daily  - Use spacer with inhaler, exhale fully before each puff - We will continue with albuterol as needed. - Call us with any issues  - Start doxycycline 100 mg twice a day for 7 days   2. Allergic rhinitis (dust mites, pollens, mold)  Skin test positive to Tree, [employer], DM  Skin test negative to grass, cat, dog, roach, horse, mouse, tobacco leaf  - Continue avoidance measures for dust mite, molds, trees, weed  - Continue  Flonase 101mg 1 spray per nostril twice a day  - Continue Astelin (azelastine) use 1-2 sprays in each nostril up to three times daily as needed  - Continue montelukast '10mg'$  daily. - Use your nose sprays as needed as you are doing.  - continue allergy injections per protocol, do NOT get your injection until asthma flare resolved   3.Atopic dermatitis - Continue with moisturizing as needed.  - Follow up with Dermatology and continue current skin care regimen    4. Gastroesophageal reflux disease - Continue with Protonix daily.  Follow up: 4 weeks    Thank you so much for letting me partake in your care today.  Don't hesitate to reach out if you have any additional concerns!  ERoney Marion MD  [facility] [place] [place]  No follow-ups on file.  Meds ordered this encounter  Medications   Benralizumab SOSY 30 mg   doxycycline (ADOXA) 100 MG tablet    Sig: Take 1 tablet (100 mg total) by mouth 2 (two) times daily for 7 days.    Dispense:  14 tablet    Refill:  0   Budeson-Glycopyrrol-Formoterol (BREZTRI AEROSPHERE) 160-9-4.8 MCG/ACT AERO    Sig: Inhale 2 puffs into the lungs in the morning and at bedtime.    Dispense:  5.9 g    Refill:  5   azelastine (ASTELIN) 0.1 % nasal spray    Sig: Place 2 sprays into both  nostrils at bedtime as needed for rhinitis or allergies.    Dispense:  30 mL    Refill:  5    Lab Orders  No laboratory test(s) ordered today   Diagnostics: Spirometry:  Tracings reviewed. His effort: Good reproducible efforts. FVC: 2.78 L FEV1: 2.12 L, 69% predicted FEV1/FVC ratio: 76% Interpretation: Spirometry consistent with mixed obstructive and restrictive disease.  To 4 puffs of albuterol FEV1 increased by 70 cc and 3%, FVC increased by 80 cc and 3% this is no significant postbronchodilator response Please see scanned spirometry results for details.  .   Medication List:  Current Outpatient Medications  Medication Sig Dispense Refill   albuterol (VENTOLIN HFA) 108 (90 Base) MCG/ACT inhaler Inhale 2 puffs into the lungs every 4 (four) hours as needed for wheezing or shortness of breath. 18 g 1   aspirin EC 81 MG tablet Take 1 tablet (81 mg total) by mouth daily. 90 tablet 3   Budeson-Glycopyrrol-Formoterol (BREZTRI AEROSPHERE) 160-9-4.8 MCG/ACT AERO Inhale 2 puffs into the lungs in the morning and at bedtime. 5.9 g 5   budesonide-formoterol (SYMBICORT) 160-4.5 MCG/ACT inhaler Inhale 2 puffs into the lungs 2 (two) times daily. 1 each 5   cetirizine (ZYRTEC) 10 MG tablet Take 10 mg by mouth daily.     cyclobenzaprine (FLEXERIL) 5 MG tablet Take 1 tablet (5 mg total) by mouth 3 (three) times daily as needed for muscle spasms. 30 tablet 1   doxycycline (ADOXA) 100 MG tablet Take 1 tablet (100 mg total) by mouth 2 (two) times daily for 7 days. 14 tablet 0   fluticasone (FLONASE) 50 MCG/ACT nasal spray One spray each nostril twice a day if needed for stuffy nose. 16 g 5   hydrocortisone 2.5 % cream Apply 1 application topically daily as needed (Eczema).     ketoconazole (NIZORAL) 2 % shampoo Apply 1 application topically once a week.     metoprolol tartrate (LOPRESSOR) 25 MG tablet TAKE 1/2 TABLET BY MOUTH TWICE A DAY 90 tablet 1   montelukast (SINGULAIR) 10 MG tablet TAKE 1 TABLET  BY MOUTH EVERYDAY AT BEDTIME 90 tablet 1   Multiple Vitamin (MULTIVITAMIN WITH MINERALS) TABS tablet Take 1 tablet by mouth daily.     olmesartan-hydrochlorothiazide (BENICAR HCT) 20-12.5 MG tablet Take 1 tablet by mouth daily. 90 tablet 0   Omega-3 Fatty Acids (FISH OIL) 1200 MG CAPS Take 1,200 mg by mouth daily.     pantoprazole (PROTONIX) 40 MG tablet Take 1 tablet (40 mg total) by mouth 2 (two) times daily. 180 tablet 3   rosuvastatin (CRESTOR) 40 MG tablet TAKE 1 TABLET BY MOUTH EVERY DAY 90 tablet 1   tadalafil (CIALIS) 5 MG tablet Take 5 mg by mouth daily as needed.     tamsulosin (FLOMAX) 0.4 MG CAPS capsule Take 1 capsule (0.4 mg total) by mouth daily. 15 capsule 0   triamcinolone cream (KENALOG)  0.1 % Apply 1 application topically daily as needed (Eczema).      azelastine (ASTELIN) 0.1 % nasal spray Place 2 sprays into both nostrils at bedtime as needed for rhinitis or allergies. 30 mL 5   azithromycin (ZITHROMAX) 250 MG tablet Two tablets on day 1 then one talet daily on day 2 through day 5. (Patient not taking: Reported on 03/11/2022) 6 each 0   chlorhexidine (HIBICLENS) 4 % external liquid Apply topically daily as needed. (Patient not taking: Reported on 01/15/2022) 120 mL 0   doxycycline (VIBRA-TABS) 100 MG tablet Take 1 tablet (100 mg total) by mouth 2 (two) times daily. (Patient not taking: Reported on 03/11/2022) 14 tablet 0   EPINEPHrine (EPIPEN 2-PAK) 0.3 mg/0.3 mL IJ SOAJ injection Use as directed for severe allergic reactions (Patient not taking: Reported on 01/15/2022) 2 each 3   mupirocin ointment (BACTROBAN) 2 % Apply 1 Application topically 2 (two) times daily. (Patient not taking: Reported on 01/15/2022) 22 g 0   No current facility-administered medications for this visit.   Allergies: Allergies  Allergen Reactions   Nifedipine Dermatitis and Rash    edema   I reviewed his past medical history, social history, family history, and environmental history and no  significant changes have been reported from his previous visit.  ROS: All others negative except as noted per HPI.   Objective: BP 118/70   Pulse 63   Temp 99.3 F (37.4 C) (Temporal)   Resp 18   SpO2 98%  There is no height or weight on file to calculate BMI. General Appearance:  Alert, cooperative, no distress, appears stated age  Head:  Normocephalic, without obvious abnormality, atraumatic  Eyes:  Conjunctiva clear, EOM's intact  Nose: Nares normal,  yellow rhinnorhea, hypertrophic turbinates, no visible anterior polyps, and septum midline  Throat: Lips, tongue normal; teeth and gums normal, no tonsillar exudate and + cobblestoning  Neck: Supple, symmetrical  Lungs:   end-expiratory wheezing, Respirations unlabored, intermittent dry coughing  Heart:  regular rate and rhythm and no murmur, Appears well perfused  Extremities: No edema  Skin: Skin color, texture, turgor normal, no rashes or lesions on visualized portions of skin   Neurologic: No gross deficits   Previous notes and tests were reviewed. The plan was reviewed with the patient/family, and all questions/concerned were addressed.  It was my pleasure to see George Alvarez today and participate in his care. Please feel free to contact me with any questions or concerns.  Sincerely,  Roney Marion, MD  Allergy & Immunology  [facility] [place] [facility] Office: [phone]

## 2022-03-11 NOTE — Progress Notes (Signed)
Immunotherapy   Patient Details  Name: George Alvarez MRN: [id number] Date of Birth: May 23, 1945  03/11/2022  [name] Started Fasenra 30 mg today sample given Frequency: every 4 wks for first 3 shots and every 8 weeks after Epi-Pen:Epi-Pen Available  Consent signed and patient instructions given. Signed   Marcos Eke 03/11/2022, 4:17 PM

## 2022-03-11 NOTE — Patient Instructions (Addendum)
Severe persistent asthma  -   Breathing tests: showed persistent inflammation in your lungs that is not fully reversible  - change symbicort to Breztri 2 puffs twice a day (sample given today) -Start Fasenra 30 mg injected under the skin every 4 weeks for first 3 injections  -Sample given today - Continue Montelukast '10mg'$  daily  - Use spacer with inhaler, exhale fully before each puff - We will continue with albuterol as needed. - Call us with any issues  - Start doxycycline 100 mg twice a day for 7 days   2. Allergic rhinitis (dust mites, pollens, mold)  Skin test positive to Tree, [employer], DM  Skin test negative to grass, cat, dog, roach, horse, mouse, tobacco leaf  - Continue avoidance measures for dust mite, molds, trees, weed  - Continue  Flonase 33mg 1 spray per nostril twice a day  - Continue Astelin (azelastine) use 1-2 sprays in each nostril up to three times daily as needed  - Continue montelukast '10mg'$  daily. - Use your nose sprays as needed as you are doing.  - continue allergy injections per protocol, do NOT get your injection until asthma flare resolved   3.Atopic dermatitis - Continue with moisturizing as needed.  - Follow up with Dermatology and continue current skin care regimen    4. Gastroesophageal reflux disease - Continue with Protonix daily.  Follow up: 4 weeks    Thank you so much for letting me partake in your care today.  Don't hesitate to reach out if you have any additional concerns!  ERoney Marion MD  [facility] [place] [place]

## 2022-03-19 ENCOUNTER — Telehealth: Payer: Self-pay | Admitting: *Deleted

## 2022-03-19 MED ORDER — FASENRA PEN 30 MG/ML ~~LOC~~ SOAJ: 30.0000 mg | SUBCUTANEOUS | 8 refills | Status: DC

## 2022-03-19 NOTE — Telephone Encounter (Signed)
Spoke to patient and advised approval from [facility] and Me for patient assistance from now to 04/07/23 for George Alvarez. Will reach out to patient next week once Rx rcvd and ready through [facility] so patient can order same. I did go over delivery and storage for same and he will bring pen to clinic for the next 2 doses for admin isntrux

## 2022-03-19 NOTE — Telephone Encounter (Signed)
-----   Message from Roney Marion, MD sent at 02/24/2022 10:51 AM EST ----- Can we see about starting Berna Bue for this patient for his asthma?  This is his second flare requiring prednisone in past 6 months

## 2022-03-27 DIAGNOSIS — R972 Elevated prostate specific antigen [PSA]: Secondary | ICD-10-CM | POA: Diagnosis not present

## 2022-03-27 LAB — PSA: PSA: 3.98

## 2022-04-03 DIAGNOSIS — N401 Enlarged prostate with lower urinary tract symptoms: Secondary | ICD-10-CM | POA: Diagnosis not present

## 2022-04-03 DIAGNOSIS — R972 Elevated prostate specific antigen [PSA]: Secondary | ICD-10-CM | POA: Diagnosis not present

## 2022-04-03 DIAGNOSIS — R350 Frequency of micturition: Secondary | ICD-10-CM | POA: Diagnosis not present

## 2022-04-03 DIAGNOSIS — N3941 Urge incontinence: Secondary | ICD-10-CM | POA: Diagnosis not present

## 2022-04-08 ENCOUNTER — Ambulatory Visit (INDEPENDENT_AMBULATORY_CARE_PROVIDER_SITE_OTHER): Payer: PPO | Admitting: Internal Medicine

## 2022-04-08 ENCOUNTER — Encounter: Payer: Self-pay | Admitting: Internal Medicine

## 2022-04-08 ENCOUNTER — Ambulatory Visit: Payer: PPO

## 2022-04-08 VITALS — BP 118/68 | HR 76 | Temp 97.9°F | Resp 18

## 2022-04-08 DIAGNOSIS — H109 Unspecified conjunctivitis: Secondary | ICD-10-CM | POA: Insufficient documentation

## 2022-04-08 DIAGNOSIS — L2084 Intrinsic (allergic) eczema: Secondary | ICD-10-CM | POA: Diagnosis not present

## 2022-04-08 DIAGNOSIS — H1045 Other chronic allergic conjunctivitis: Secondary | ICD-10-CM

## 2022-04-08 DIAGNOSIS — J4550 Severe persistent asthma, uncomplicated: Secondary | ICD-10-CM | POA: Diagnosis not present

## 2022-04-08 DIAGNOSIS — J3089 Other allergic rhinitis: Secondary | ICD-10-CM

## 2022-04-08 DIAGNOSIS — J302 Other seasonal allergic rhinitis: Secondary | ICD-10-CM

## 2022-04-08 DIAGNOSIS — K219 Gastro-esophageal reflux disease without esophagitis: Secondary | ICD-10-CM

## 2022-04-08 NOTE — Progress Notes (Signed)
Follow Up Note  RE: George Alvarez MRN: [id number] DOB: 08-Jun-1945 Date of Office Visit: 04/08/2022  Referring provider: Colon Branch, MD Primary care provider: Colon Branch, MD  Chief Complaint: No chief complaint on file.  History of Present Illness: I had the pleasure of seeing George Alvarez for a follow up visit at the [facility] [place] [facility] [place] on 04/08/2022. He is a 77 y.o. male, who is being followed for severe persistent asthma, allergic rhinitis, . His previous allergy office visit was on 03/11/22 with Dr. Edison Pace. Today is a regular follow up visit.  History obtained from patient, chart review.  ***  Assessment and Plan: George Alvarez is a 77 y.o. male with: Severe persistent asthma without complication Plan: There are no Patient Instructions on file for this visit. No follow-ups on file.  No orders of the defined types were placed in this encounter.   Lab Orders  No laboratory test(s) ordered today   Diagnostics: Spirometry:  Tracings reviewed. His effort: {Blank single:19197::"Good reproducible efforts.","It was hard to get consistent efforts and there is a question as to whether this reflects a maximal maneuver.","Poor effort, data can not be interpreted."} FVC: ***L FEV1: ***L, ***% predicted FEV1/FVC ratio: ***% Interpretation: {Blank single:19197::"Spirometry consistent with mild obstructive disease","Spirometry consistent with moderate obstructive disease","Spirometry consistent with severe obstructive disease","Spirometry consistent with possible restrictive disease","Spirometry consistent with mixed obstructive and restrictive disease","Spirometry uninterpretable due to technique","Spirometry consistent with normal pattern","No overt abnormalities noted given today's efforts"}.  Please see scanned spirometry results for details.  Skin Testing: {Blank single:19197::"Select foods","Environmental allergy panel","Environmental allergy panel and select  foods","Food allergy panel","None","Deferred due to recent antihistamines use"}. *** Results interpreted by myself during this encounter and discussed with patient/family.   Medication List:  Current Outpatient Medications  Medication Sig Dispense Refill   albuterol (VENTOLIN HFA) 108 (90 Base) MCG/ACT inhaler Inhale 2 puffs into the lungs every 4 (four) hours as needed for wheezing or shortness of breath. 18 g 1   aspirin EC 81 MG tablet Take 1 tablet (81 mg total) by mouth daily. 90 tablet 3   azelastine (ASTELIN) 0.1 % nasal spray Place 2 sprays into both nostrils at bedtime as needed for rhinitis or allergies. 30 mL 5   Benralizumab (FASENRA PEN) 30 MG/ML SOAJ Inject 1 mL (30 mg total) into the skin every 28 (twenty-eight) days. For 2 more doses (1st dose 12/5 /23) then every 8 weeks 1 mL 8   Budeson-Glycopyrrol-Formoterol (BREZTRI AEROSPHERE) 160-9-4.8 MCG/ACT AERO Inhale 2 puffs into the lungs in the morning and at bedtime. 5.9 g 5   budesonide-formoterol (SYMBICORT) 160-4.5 MCG/ACT inhaler Inhale 2 puffs into the lungs 2 (two) times daily. 1 each 5   cetirizine (ZYRTEC) 10 MG tablet Take 10 mg by mouth daily.     cyclobenzaprine (FLEXERIL) 5 MG tablet Take 1 tablet (5 mg total) by mouth 3 (three) times daily as needed for muscle spasms. 30 tablet 1   EPINEPHrine (EPIPEN 2-PAK) 0.3 mg/0.3 mL IJ SOAJ injection Use as directed for severe allergic reactions 2 each 3   fluticasone (FLONASE) 50 MCG/ACT nasal spray One spray each nostril twice a day if needed for stuffy nose. 16 g 5   hydrocortisone 2.5 % cream Apply 1 application topically daily as needed (Eczema).     ketoconazole (NIZORAL) 2 % shampoo Apply 1 application topically once a week.     metoprolol tartrate (LOPRESSOR) 25 MG tablet TAKE 1/2 TABLET BY MOUTH TWICE A DAY 90 tablet  1   montelukast (SINGULAIR) 10 MG tablet TAKE 1 TABLET BY MOUTH EVERYDAY AT BEDTIME 90 tablet 1   Multiple Vitamin (MULTIVITAMIN WITH MINERALS) TABS tablet  Take 1 tablet by mouth daily.     olmesartan-hydrochlorothiazide (BENICAR HCT) 20-12.5 MG tablet Take 1 tablet by mouth daily. 90 tablet 0   Omega-3 Fatty Acids (FISH OIL) 1200 MG CAPS Take 1,200 mg by mouth daily.     pantoprazole (PROTONIX) 40 MG tablet Take 1 tablet (40 mg total) by mouth 2 (two) times daily. 180 tablet 3   rosuvastatin (CRESTOR) 40 MG tablet TAKE 1 TABLET BY MOUTH EVERY DAY 90 tablet 1   tadalafil (CIALIS) 5 MG tablet Take 5 mg by mouth daily as needed.     tamsulosin (FLOMAX) 0.4 MG CAPS capsule Take 1 capsule (0.4 mg total) by mouth daily. 15 capsule 0   triamcinolone cream (KENALOG) 0.1 % Apply 1 application topically daily as needed (Eczema).      azithromycin (ZITHROMAX) 250 MG tablet Two tablets on day 1 then one talet daily on day 2 through day 5. (Patient not taking: Reported on 03/11/2022) 6 each 0   chlorhexidine (HIBICLENS) 4 % external liquid Apply topically daily as needed. (Patient not taking: Reported on 01/15/2022) 120 mL 0   doxycycline (VIBRA-TABS) 100 MG tablet Take 1 tablet (100 mg total) by mouth 2 (two) times daily. (Patient not taking: Reported on 03/11/2022) 14 tablet 0   mupirocin ointment (BACTROBAN) 2 % Apply 1 Application topically 2 (two) times daily. (Patient not taking: Reported on 01/15/2022) 22 g 0   No current facility-administered medications for this visit.   Allergies: Allergies  Allergen Reactions   Nifedipine Dermatitis and Rash    edema   I reviewed his past medical history, social history, family history, and environmental history and no significant changes have been reported from his previous visit.  ROS: All others negative except as noted per HPI.   Objective: There were no vitals taken for this visit. There is no height or weight on file to calculate BMI. General Appearance:  Alert, cooperative, no distress, appears stated age  Head:  Normocephalic, without obvious abnormality, atraumatic  Eyes:  Conjunctiva clear, EOM's  intact  Nose: Nares normal, {Blank multiple:19196:a:"***","hypertrophic turbinates","normal mucosa","no visible anterior polyps","septum midline"}  Throat: Lips, tongue normal; teeth and gums normal, {Blank multiple:19196:a:"***","normal posterior oropharynx","tonsils 2+","tonsils 3+","no tonsillar exudate","+ cobblestoning"}  Neck: Supple, symmetrical  Lungs:   {Blank multiple:19196:a:"***","clear to auscultation bilaterally","end-expiratory wheezing","wheezing throughout"}, Respirations unlabored, {Blank multiple:19196:a:"***","no coughing","intermittent dry coughing"}  Heart:  {Blank multiple:19196:a:"***","regular rate and rhythm","no murmur"}, Appears well perfused  Extremities: No edema  Skin: Skin color, texture, turgor normal, no rashes or lesions on visualized portions of skin {Blank multiple:19196:a:""***"}  Neurologic: No gross deficits   Previous notes and tests were reviewed. The plan was reviewed with the patient/family, and all questions/concerned were addressed.  It was my pleasure to see George Alvarez today and participate in his care. Please feel free to contact me with any questions or concerns.  Sincerely,  Roney Marion, MD  Allergy & Immunology  [facility] [place] [facility] Office: [phone]

## 2022-04-08 NOTE — Patient Instructions (Addendum)
Severe persistent asthma  -   Breathing tests: looked stable  - Continue Breztri 2 puffs twice a day with spacer   -wash mouth out of use  -Continue Fasenra 30 mg injected under the skin every 4 weeks for first 3 injections  - Your 3rd injection in on JAN 30th, at that point you next injection will be 8 weeks later on March 26th  - Continue Montelukast '10mg'$  daily  - Use spacer with inhaler, exhale fully before each puff - We will continue with albuterol as needed. - Call us with any issues     2. Allergic rhinitis (dust mites, pollens, mold)  Skin test positive to Tree, [employer], DM  Skin test negative to grass, cat, dog, roach, horse, mouse, tobacco leaf  - Continue avoidance measures for dust mite, molds, trees, weed  - Continue  Flonase 8mg 1 spray per nostril twice a day  - Continue Astelin (azelastine) use 1-2 sprays in each nostril up to three times daily as needed  - Continue montelukast '10mg'$  daily. - Use your nose sprays as needed as you are doing.  - RUSH codes provided.  This is a better option for you as will you back to where you were in a faster time.  However like to hold off on rash until you have been on Fasenra and asthma is better controlled -If RUSH not covered recommend following up with standard buildup as soon as works logistically   3.Atopic dermatitis - Continue with moisturizing as needed.  - Follow up with Dermatology and continue current skin care regimen    4. Gastroesophageal reflux disease - Continue with Protonix daily.  Follow up: in 4 months  Give [place] call if RUSH is not covered and we can restart start with standard build up   Thank you so much for letting me partake in your care today.  Don't hesitate to reach out if you have any additional concerns!  ERoney Marion MD  [facility] [place]

## 2022-04-11 ENCOUNTER — Other Ambulatory Visit: Payer: Self-pay | Admitting: Internal Medicine

## 2022-04-15 ENCOUNTER — Telehealth: Payer: Self-pay

## 2022-04-15 NOTE — Telephone Encounter (Signed)
Patient had called this morning letting us know his insurance company would not take his cpt code from him, it would have to come from the Dr.'s office. Cpt code given. I called Medicare health team advantage and gave them all patient's information and No PA needed and patient will be responsible for 20%. Patient is aware. Patient is already scheduled for 04/28/2022 with Dr. Edison Pace. Vira Agar will mail out premedication sheet for patient to premedicate the night before and the day of his rush appointment. Patient aware to bring plenty to do and food to eat for the 8 hours he will be in office doing the rush.

## 2022-04-15 NOTE — Telephone Encounter (Signed)
Left message for patient to call office to get him scheduled for rush.

## 2022-04-15 NOTE — Telephone Encounter (Signed)
Let patient know he was not scheduled for rush and Vira Agar will call patient to schedule him for rush and pauline will send out the premedication sheet. He is scheduled for fasenra for 05/06/2022

## 2022-04-16 ENCOUNTER — Encounter: Payer: PPO | Admitting: Internal Medicine

## 2022-04-21 ENCOUNTER — Encounter: Payer: Self-pay | Admitting: Internal Medicine

## 2022-04-22 ENCOUNTER — Other Ambulatory Visit: Payer: Self-pay | Admitting: Internal Medicine

## 2022-04-22 MED ORDER — PREDNISONE 10 MG PO TABS: ORAL_TABLET | ORAL | 0 refills | Status: DC

## 2022-04-22 MED ORDER — DOXYCYCLINE MONOHYDRATE 100 MG PO TABS: 100.0000 mg | ORAL_TABLET | Freq: Two times a day (BID) | ORAL | 0 refills | Status: AC

## 2022-04-22 NOTE — Telephone Encounter (Signed)
I can call in prednisone and Antbiotics but would like him to be seen on Friday.  I also need a pharamcy address/phone number in [place] to send it too.   For now lets push off the [facility] appointment.  The plan was to [facility] in a couple months after asthma is better controlled.  Thanks!

## 2022-04-22 NOTE — Telephone Encounter (Signed)
Prescriptipns are placed, he should get an appointment for Friday to be seen.  Thanks!

## 2022-04-28 ENCOUNTER — Ambulatory Visit (INDEPENDENT_AMBULATORY_CARE_PROVIDER_SITE_OTHER): Payer: PPO | Admitting: Internal Medicine

## 2022-04-28 VITALS — BP 142/72 | HR 63 | Temp 98.2°F | Resp 18

## 2022-04-28 DIAGNOSIS — K219 Gastro-esophageal reflux disease without esophagitis: Secondary | ICD-10-CM | POA: Diagnosis not present

## 2022-04-28 DIAGNOSIS — L2084 Intrinsic (allergic) eczema: Secondary | ICD-10-CM | POA: Diagnosis not present

## 2022-04-28 DIAGNOSIS — J4550 Severe persistent asthma, uncomplicated: Secondary | ICD-10-CM | POA: Diagnosis not present

## 2022-04-28 DIAGNOSIS — J3089 Other allergic rhinitis: Secondary | ICD-10-CM | POA: Insufficient documentation

## 2022-04-28 MED ORDER — PANTOPRAZOLE SODIUM 40 MG PO TBEC: 40.0000 mg | DELAYED_RELEASE_TABLET | Freq: Two times a day (BID) | ORAL | 3 refills | Status: DC

## 2022-04-28 NOTE — Progress Notes (Signed)
Follow Up Note  RE: VINCENZO STAVE MRN: [id number] DOB: 15-Jun-1945 Date of Office Visit: 04/28/2022  Referring provider: Colon Branch, MD Primary care provider: Colon Branch, MD  Chief Complaint: Asthma (Post asthma flare)  History of Present Illness: I had the pleasure of seeing Jeanclaude Wentworth for a follow up visit at the [facility] [place] [facility] [place] on 04/28/2022. He is a 77 y.o. male, who is being followed for severe persistent asthma on Fasenra, allergic rhinitis. His previous allergy office visit was on 04/08/2022 with Dr. Edison Pace. Today is a regular follow up visit.  History obtained from patient, chart review.  Patient contacted the clinic on 04/21/2022 with increased wheeze, cough, congestion.  He was in [place] to take care of his mom and could not come in to be seen.  Prednisone and doxycycline was called in.  He is here today for follow-up.  Today he reports he has 1 day left on his prednisone.  He still has persistent cough but wheezing and shortness of breath is improved.  He will receive his third injection of Fasenra on the 30th and moved to every 8 weeks then.  He reports compliance with his Breztri 2 puffs twice daily.  He has been using his albuterol 1-2 times a day over the past week, but this has decreased with prednisone.  Denies any persistent sinus pressure or congestion.  He ultimately would still like to restart his allergy injections however plan is to allow Berna Bue time to work get asthma under better control before restarting.  Last allergy injection was September 2023 and he received 0.05 of the gold vial. Ait started 06/26/21: He is receiving 1 vial containing tree, weed, mold and dust mite pollen.   GERD is well-controlled but he needs a refill of his Protonix.  Not required any topical steroids for his eczema.  Assessment and Plan: Rubel is a 77 y.o. male with: No diagnosis found. Plan: Patient Instructions  Severe persistent asthma  with acute  exacebration: slowly improving  Continue prednisone as prescribed - Continue Breztri 2 puffs twice a day with spacer   -wash mouth out of use  -Continue Fasenra 30 mg injected under the skin every 4 weeks for first 3 injections  - Your 3rd injection in on JAN 30th, at that point you next injection will be 8 weeks later on March 26th  - Continue Montelukast '10mg'$  daily  - Use spacer with inhaler, exhale fully before each puff - We will continue with albuterol as needed.     2. Allergic rhinitis (dust mites, pollens, mold)  Skin test positive to Tree, [employer], DM  Skin test negative to grass, cat, dog, roach, horse, mouse, tobacco leaf  - Continue avoidance measures for dust mite, molds, trees, weed  - Continue  Flonase 69mg 1 spray per nostril twice a day  - Continue Astelin (azelastine) use 1-2 sprays in each nostril up to three times daily as needed  - Continue montelukast '10mg'$  daily. - START sinus rinses 10-15 minutes prior to nose sprays     3.Atopic dermatitis - Continue with moisturizing as needed.  - Follow up with Dermatology and continue current skin care regimen    4. Gastroesophageal reflux disease - Continue with Protonix daily.  -Refilled today   Follow up: in 4 months    Thank you so much for letting me partake in your care today.  Don't hesitate to reach out if you have any additional concerns!  EEstill Bamberg  Edison Pace, MD  [facility] [place] [place] No follow-ups on file.  Meds ordered this encounter  Medications   pantoprazole (PROTONIX) 40 MG tablet    Sig: Take 1 tablet (40 mg total) by mouth 2 (two) times daily.    Dispense:  180 tablet    Refill:  3    Lab Orders  No laboratory test(s) ordered today   Diagnostics: None done    Medication List:  Current Outpatient Medications  Medication Sig Dispense Refill   albuterol (VENTOLIN HFA) 108 (90 Base) MCG/ACT inhaler Inhale 2 puffs into the lungs every 4 (four) hours as needed for  wheezing or shortness of breath. 18 g 1   aspirin EC 81 MG tablet Take 1 tablet (81 mg total) by mouth daily. 90 tablet 3   azelastine (ASTELIN) 0.1 % nasal spray Place 2 sprays into both nostrils at bedtime as needed for rhinitis or allergies. 30 mL 5   Benralizumab (FASENRA PEN) 30 MG/ML SOAJ Inject 1 mL (30 mg total) into the skin every 28 (twenty-eight) days. For 2 more doses (1st dose 12/5 /23) then every 8 weeks 1 mL 8   Budeson-Glycopyrrol-Formoterol (BREZTRI AEROSPHERE) 160-9-4.8 MCG/ACT AERO Inhale 2 puffs into the lungs in the morning and at bedtime. 5.9 g 5   cetirizine (ZYRTEC) 10 MG tablet Take 10 mg by mouth daily.     chlorhexidine (HIBICLENS) 4 % external liquid Apply topically daily as needed. 120 mL 0   doxycycline (ADOXA) 100 MG tablet Take 1 tablet (100 mg total) by mouth 2 (two) times daily for 7 days. 14 tablet 0   EPINEPHrine (EPIPEN 2-PAK) 0.3 mg/0.3 mL IJ SOAJ injection Use as directed for severe allergic reactions 2 each 3   fluticasone (FLONASE) 50 MCG/ACT nasal spray One spray each nostril twice a day if needed for stuffy nose. 16 g 5   hydrocortisone 2.5 % cream Apply 1 application topically daily as needed (Eczema).     ketoconazole (NIZORAL) 2 % shampoo Apply 1 application topically once a week.     metoprolol tartrate (LOPRESSOR) 25 MG tablet TAKE 1/2 TABLET BY MOUTH TWICE A DAY 90 tablet 1   montelukast (SINGULAIR) 10 MG tablet TAKE 1 TABLET BY MOUTH EVERYDAY AT BEDTIME 90 tablet 1   Multiple Vitamin (MULTIVITAMIN WITH MINERALS) TABS tablet Take 1 tablet by mouth daily.     mupirocin ointment (BACTROBAN) 2 % Apply 1 Application topically 2 (two) times daily. 22 g 0   olmesartan-hydrochlorothiazide (BENICAR HCT) 20-12.5 MG tablet Take 1 tablet by mouth daily. 90 tablet 1   Omega-3 Fatty Acids (FISH OIL) 1200 MG CAPS Take 1,200 mg by mouth daily.     predniSONE (DELTASONE) 10 MG tablet Take prednisone '40mg'$  daily x 2 days, '30mg'$  daily x 2 days, '20mg'$  daily x 2 days and  '10mg'$  daily x 2 days. 20 tablet 0   rosuvastatin (CRESTOR) 40 MG tablet TAKE 1 TABLET BY MOUTH EVERY DAY 90 tablet 1   tadalafil (CIALIS) 5 MG tablet Take 5 mg by mouth daily as needed.     tamsulosin (FLOMAX) 0.4 MG CAPS capsule Take 1 capsule (0.4 mg total) by mouth daily. 15 capsule 0   triamcinolone cream (KENALOG) 0.1 % Apply 1 application topically daily as needed (Eczema).      pantoprazole (PROTONIX) 40 MG tablet Take 1 tablet (40 mg total) by mouth 2 (two) times daily. 180 tablet 3   No current facility-administered medications for this visit.  Allergies: Allergies  Allergen Reactions   Nifedipine Dermatitis and Rash    edema   I reviewed his past medical history, social history, family history, and environmental history and no significant changes have been reported from his previous visit.  ROS: All others negative except as noted per HPI.   Objective: BP (!) 142/72   Pulse 63   Temp 98.2 F (36.8 C) (Temporal)   Resp 18   SpO2 97%  There is no height or weight on file to calculate BMI. General Appearance:  Alert, cooperative, no distress, appears stated age  Head:  Normocephalic, without obvious abnormality, atraumatic  Eyes:  Conjunctiva clear, EOM's intact  Nose: Nares normal,  erythematous and edematous nasal mucosa, hypertrophic turbinates, no visible anterior polyps, and septum midline  Throat: Lips, tongue normal; teeth and gums normal, + cobblestoning  Neck: Supple, symmetrical  Lungs:   clear to auscultation bilaterally, Respirations unlabored, intermittent dry coughing  Heart:  regular rate and rhythm and no murmur, Appears well perfused  Extremities: No edema  Skin: Skin color, texture, turgor normal, no rashes or lesions on visualized portions of skin   Neurologic: No gross deficits   Previous notes and tests were reviewed. The plan was reviewed with the patient/family, and all questions/concerned were addressed.  It was my pleasure to see Cael today  and participate in his care. Please feel free to contact me with any questions or concerns.  Sincerely,  Roney Marion, MD  Allergy & Immunology  [facility] [place] [facility] Office: [phone]

## 2022-04-28 NOTE — Patient Instructions (Addendum)
Severe persistent asthma  with acute exacebration: slowly improving  Continue prednisone as prescribed - Continue Breztri 2 puffs twice a day with spacer   -wash mouth out of use  -Continue Fasenra 30 mg injected under the skin every 4 weeks for first 3 injections  - Your 3rd injection in on JAN 30th, at that point you next injection will be 8 weeks later on March 26th  - Continue Montelukast '10mg'$  daily  - Use spacer with inhaler, exhale fully before each puff - We will continue with albuterol as needed.     2. Allergic rhinitis (dust mites, pollens, mold)  Skin test positive to Tree, [employer], DM  Skin test negative to grass, cat, dog, roach, horse, mouse, tobacco leaf  - Continue avoidance measures for dust mite, molds, trees, weed  - Continue  Flonase 22mg 1 spray per nostril twice a day  - Continue Astelin (azelastine) use 1-2 sprays in each nostril up to three times daily as needed  - Continue montelukast '10mg'$  daily. - START sinus rinses 10-15 minutes prior to nose sprays     3.Atopic dermatitis - Continue with moisturizing as needed.  - Follow up with Dermatology and continue current skin care regimen    4. Gastroesophageal reflux disease - Continue with Protonix daily.  -Refilled today   Follow up: in 4 months    Thank you so much for letting me partake in your care today.  Don't hesitate to reach out if you have any additional concerns!  ERoney Marion MD  [facility] [place]

## 2022-05-01 ENCOUNTER — Ambulatory Visit (INDEPENDENT_AMBULATORY_CARE_PROVIDER_SITE_OTHER): Payer: PPO | Admitting: Podiatry

## 2022-05-01 ENCOUNTER — Encounter: Payer: Self-pay | Admitting: Podiatry

## 2022-05-01 DIAGNOSIS — B351 Tinea unguium: Secondary | ICD-10-CM | POA: Diagnosis not present

## 2022-05-01 DIAGNOSIS — M79675 Pain in left toe(s): Secondary | ICD-10-CM | POA: Diagnosis not present

## 2022-05-01 DIAGNOSIS — M79674 Pain in right toe(s): Secondary | ICD-10-CM | POA: Diagnosis not present

## 2022-05-01 DIAGNOSIS — E119 Type 2 diabetes mellitus without complications: Secondary | ICD-10-CM

## 2022-05-01 NOTE — Progress Notes (Signed)
Subjective:  Patient ID: George Alvarez, male    DOB: 08/24/1945,   MRN: [id number]  Chief Complaint  Patient presents with   Nail Problem    [facility]    77 y.o. male presents for diabetic foot exam and  concern of thickened elongated and painful nails that are difficult to trim. He did attempt to trim this morning and accidentally knicked himself on his right fourth toe.  Relates burning and tingling in their feet. Patient is diabetic and last A1c was  Lab Results  Component Value Date   HGBA1C 6.7 (H) 10/02/2021   .   PCP:  Colon Branch, MD    . Denies any other pedal complaints. Denies n/v/f/c.   Past Medical History:  Diagnosis Date   Allergy    seasonal   Anal fissure    Arthritis    Asthma, chronic 06/16/2007   Qualifier: Diagnosis of  By: Linna Darner MD, Gwyndolyn Saxon   Onset:as child Triggers (environmental, infectious, allergic): all, mainly environmental triggers Rescue inhaler JKK:[id number] Maintenance medications/ response:Singulair,Qvar Smoking history:never Family history pulmonary disease: no     Basal cell carcinoma of chest wall 2016   1.8 cm, treated with electrodessication and currettage   Benign paroxysmal positional vertigo 11/19/2012   Diagnosed at [facility] [place]  Physical therapy appointment pending    BPH (benign prostatic hyperplasia)    With urinary obstruction   Coronary artery disease    a. 02/2012 Cath/PCI: LM 10, LAD min irregs, LCX large, OM1 sm, 40 ost, OM2 95p (5.0x16 Veriflex & 4.5x12 Veriflex BMS'), RCA 30p, 41m 30d, PDA/PLA min irregs, EF 65%   Diabetes mellitus without complication ([place]    Diverticulosis    Erectile dysfunction due to arterial insufficiency    Fundic gland polyps of stomach, benign 2010   GERD (gastroesophageal reflux disease)    History of elevated PSA    History of kidney stones    Hyperlipidemia    Hypertension    Nocturia    Perennial allergic rhinitis    Peyronie's disease    Skin cancer    Basal and squamous cell  cancers, greater than 20   Stroke ([place] 2016   Tubular adenoma of colon 2010    Objective:  Physical Exam: Vascular: DP/PT pulses 2/4 bilateral. CFT <3 seconds. Absent hair growth on digits. Edema noted to bilateral lower extremities. Xerosis noted bilaterally.  Skin. No lacerations or abrasions bilateral feet. Nails 1-5 bilateral  are thickened discolored and elongated with subungual debris. Small area of hemorrhage from cutting himself while attempting to trim his nails.  Musculoskeletal: MMT 5/5 bilateral lower extremities in DF, PF, Inversion and Eversion. Deceased ROM in DF of ankle joint.  Neurological: Sensation intact to light touch. Protective sensation diminished bilateral.    Assessment:   1. Pain due to onychomycosis of toenails of both feet   2. Diabetes mellitus without complication ([place]      Plan:  Patient was evaluated and treated and all questions answered. -Discussed and educated patient on diabetic foot care, especially with  regards to the vascular, neurological and musculoskeletal systems.  -Stressed the importance of good glycemic control and the detriment of not  controlling glucose levels in relation to the foot. -Discussed supportive shoes at all times and checking feet regularly.  -Mechanically debrided all nails 1-5 bilateral using sterile nail nipper and filed with dremel without incident  -Neosporin and bandaid applied to small incident on his right fourth toe.  -Answered all patient  questions -Patient to return  in 3 months for at risk foot care -Patient advised to call the office if any problems or questions arise in the meantime.   Lorenda Peck, DPM

## 2022-05-06 ENCOUNTER — Ambulatory Visit (INDEPENDENT_AMBULATORY_CARE_PROVIDER_SITE_OTHER): Payer: PPO

## 2022-05-06 DIAGNOSIS — J4550 Severe persistent asthma, uncomplicated: Secondary | ICD-10-CM

## 2022-05-12 DIAGNOSIS — L820 Inflamed seborrheic keratosis: Secondary | ICD-10-CM | POA: Diagnosis not present

## 2022-05-12 DIAGNOSIS — L570 Actinic keratosis: Secondary | ICD-10-CM | POA: Diagnosis not present

## 2022-05-12 DIAGNOSIS — C44712 Basal cell carcinoma of skin of right lower limb, including hip: Secondary | ICD-10-CM | POA: Diagnosis not present

## 2022-05-12 DIAGNOSIS — Z859 Personal history of malignant neoplasm, unspecified: Secondary | ICD-10-CM | POA: Diagnosis not present

## 2022-05-12 DIAGNOSIS — Z129 Encounter for screening for malignant neoplasm, site unspecified: Secondary | ICD-10-CM | POA: Diagnosis not present

## 2022-05-12 DIAGNOSIS — L821 Other seborrheic keratosis: Secondary | ICD-10-CM | POA: Diagnosis not present

## 2022-06-21 ENCOUNTER — Other Ambulatory Visit: Payer: Self-pay | Admitting: Internal Medicine

## 2022-07-01 ENCOUNTER — Other Ambulatory Visit: Payer: Self-pay | Admitting: Internal Medicine

## 2022-07-01 ENCOUNTER — Ambulatory Visit (INDEPENDENT_AMBULATORY_CARE_PROVIDER_SITE_OTHER): Payer: PPO

## 2022-07-01 DIAGNOSIS — J4550 Severe persistent asthma, uncomplicated: Secondary | ICD-10-CM

## 2022-07-01 MED ORDER — BENRALIZUMAB 30 MG/ML ~~LOC~~ SOSY
30.0000 mg | PREFILLED_SYRINGE | SUBCUTANEOUS | Status: DC
  Administered 2022-07-01: 30 mg via SUBCUTANEOUS

## 2022-07-06 ENCOUNTER — Encounter: Payer: Self-pay | Admitting: Internal Medicine

## 2022-07-14 ENCOUNTER — Encounter: Payer: Self-pay | Admitting: Internal Medicine

## 2022-07-14 DIAGNOSIS — H02889 Meibomian gland dysfunction of unspecified eye, unspecified eyelid: Secondary | ICD-10-CM | POA: Diagnosis not present

## 2022-07-14 DIAGNOSIS — H5213 Myopia, bilateral: Secondary | ICD-10-CM | POA: Diagnosis not present

## 2022-07-14 DIAGNOSIS — D3132 Benign neoplasm of left choroid: Secondary | ICD-10-CM | POA: Diagnosis not present

## 2022-07-14 DIAGNOSIS — E119 Type 2 diabetes mellitus without complications: Secondary | ICD-10-CM | POA: Diagnosis not present

## 2022-07-14 DIAGNOSIS — H524 Presbyopia: Secondary | ICD-10-CM | POA: Diagnosis not present

## 2022-07-14 DIAGNOSIS — H25811 Combined forms of age-related cataract, right eye: Secondary | ICD-10-CM | POA: Diagnosis not present

## 2022-07-14 LAB — HM DIABETES EYE EXAM

## 2022-07-15 ENCOUNTER — Other Ambulatory Visit: Payer: Self-pay

## 2022-07-15 ENCOUNTER — Encounter: Payer: Self-pay | Admitting: Internal Medicine

## 2022-07-15 ENCOUNTER — Ambulatory Visit (INDEPENDENT_AMBULATORY_CARE_PROVIDER_SITE_OTHER): Payer: PPO | Admitting: Internal Medicine

## 2022-07-15 VITALS — BP 124/77 | HR 67 | Temp 98.3°F | Resp 16 | Ht 70.0 in | Wt 228.2 lb

## 2022-07-15 DIAGNOSIS — L0390 Cellulitis, unspecified: Secondary | ICD-10-CM

## 2022-07-15 NOTE — Patient Instructions (Signed)
Doing well after 3 months decolonization   Can go off decolonization now and call me if cellulitis happens again

## 2022-07-15 NOTE — Progress Notes (Signed)
[facility]     Patient Active Problem List   Diagnosis Date Noted   Other allergic rhinitis 04/28/2022   Severe persistent asthma without complication 04/08/2022   Conjunctivitis 04/08/2022   TMJ (temporomandibular joint disorder) 09/17/2021   MRSA (methicillin resistant Staphylococcus aureus) infection 09/17/2021   Rash 08/16/2021   Diabetes mellitus without complication 04/03/2021   Intrinsic atopic dermatitis 11/22/2019   Chest pain of uncertain etiology    Viral upper respiratory tract infection with cough 07/06/2017   Current use of beta blocker 08/09/2015   Moderate persistent asthma without complication 02/20/2015   Seasonal and perennial allergic rhinitis 02/20/2015   PCP NOTES >>> 01/11/2015   Cerebellar infarct 06/26/2014   Annual physical exam 02/20/2014   Benign paroxysmal positional vertigo 11/19/2012   CAD (coronary artery disease) 02/07/2012   Gastroesophageal reflux disease without esophagitis 01/01/2009   SKIN CANCER, HX OF 12/28/2007   Hyperlipidemia 06/16/2007   Essential hypertension 06/16/2007   Asthma, chronic 06/16/2007   Spinal stenosis 03/01/2007   NEPHROLITHIASIS, HX OF 05/14/2006    Cc -- recurrent cellulitis  HPI: George Alvarez is a 77 y.o. male asthma, eczema referred here by pcp for recurrent boils  07/15/22 id clinic f/u Doing well since 3 months decolonization. No cellulitis. That's the longest he had gone since seeing me 01/2022 No other concern    Initial rcid visit 01/2022: ------------- Patient has recurrent skin abscesses since 08/2021 These lesions would be on different of his body including trunk, extremities, neck. They are not consistently at the same location   He went to the ed 01/15/22 for I&D and this was the only time. He was given doxycycline at that time for a week  Previously he also has doxycycline 4 times since 08/2021  He did the decolonization x2 (June and this last time  01/2022)  He had no fever/chill  Right now the lesion on the neck had resolved.   Lives with his wife. His wife doesn't have issue with skin rash  He gets allergy shots but otherwise no injection or medication for immunomodulation  He has eczema but no current lesions now  Review of Systems: ROS All other ros negative       Past Medical History:  Diagnosis Date   Allergy    seasonal   Anal fissure    Arthritis    Asthma, chronic 06/16/2007   Qualifier: Diagnosis of  By: Alwyn RenHopper MD, Chrissie NoaWilliam   Onset:as child Triggers (environmental, infectious, allergic): all, mainly environmental triggers Rescue inhaler ZOX:WRUEAVuse:rarely Maintenance medications/ response:Singulair,Qvar Smoking history:never Family history pulmonary disease: no     Basal cell carcinoma of chest wall 2016   1.8 cm, treated with electrodessication and currettage   Benign paroxysmal positional vertigo 11/19/2012   [facility] [place]  Physical therapy appointment pending    BPH (benign prostatic hyperplasia)    With urinary obstruction   Coronary artery disease    a. 02/2012 Cath/PCI: LM 10, LAD min irregs, LCX large, OM1 sm, 40 ost, OM2 95p (5.0x16 Veriflex & 4.5x12 Veriflex BMS'), RCA 30p, 3447m, 30d, PDA/PLA min irregs, EF 65%   Diabetes mellitus without complication    Diverticulosis    Erectile dysfunction due to arterial insufficiency    Fundic gland polyps of stomach, benign 2010   GERD (gastroesophageal reflux disease)    History of elevated PSA    History of kidney stones    Hyperlipidemia  Hypertension    Nocturia    Perennial allergic rhinitis    Peyronie's disease    Skin cancer    Basal and squamous cell cancers, greater than 20   Stroke 2016   Tubular adenoma of colon 2010    Social History   Tobacco Use   Smoking status: Never   Smokeless tobacco: Never  Vaping Use   Vaping Use: Never used  Substance Use Topics   Alcohol use: Not Currently    Comment: Wine  occasionally   Drug use: No    Family History  Problem Relation Age of Onset   Heart attack Father 33       died @ 28   Heart disease Mother    Breast cancer Mother    Esophageal cancer Mother        Barrett's   Heart attack Maternal Grandmother        after 44   Dementia Maternal Grandmother        CVA   Breast cancer Maternal Grandmother    Diabetes Maternal Grandmother    Stroke Maternal Grandmother        in 54s   Heart attack Paternal Uncle 39   Breast cancer Maternal Aunt    Colon cancer Neg Hx    Prostate cancer Neg Hx    Colon polyps Neg Hx    Rectal cancer Neg Hx    Stomach cancer Neg Hx     Allergies  Allergen Reactions   Nifedipine Dermatitis and Rash    edema    OBJECTIVE: Vitals:   07/15/22 1028  BP: 124/77  Pulse: 67  Resp: 16  Temp: 98.3 F (36.8 C)  TempSrc: Oral  SpO2: 96%  Weight: 228 lb 3.2 oz (103.5 kg)  Height: 5\' 10"  (1.778 m)   Body mass index is 32.74 kg/m.   Physical Exam General/constitutional: no distress, pleasant HEENT: Normocephalic, PER, Conj Clear, EOMI, Oropharynx clear Neck supple CV: rrr no mrg Lungs: clear to auscultation, normal respiratory effort Abd: Soft, Nontender Ext: no edema Skin: No Rash Neuro: nonfocal MSK: no peripheral joint swelling/tenderness/warmth; back spines nontender   Lab:  Microbiology:  Serology:  Imaging:   Assessment/plan: Problem List Items Addressed This Visit   None Visit Diagnoses     Recurrent cellulitis    -  Primary        Discuss the pathogenesis/risk of recurrent skin infection with staph aureus/mrsa. He has had mssa on culture only Discuss the data in terms of decolonization for inpatient and outpatient setting. While inpatient does work for reducing line/device/wound infection, it is unclear in terms of outpatient recurrence of skin infection  For now I have advised him to continue monthly decolonization with muciporin/chlorhexidine. I will ask his wife to do  the same to time with him, and also for them to wash everything in their house and bleach down surface twice weekly during decolonization procedure  Tapered procedure for the decolonization discussed for the next 2 years   Call me if another boil episode occur otherwise, we can do systemic abx    F/u as needed   07/15/22 id assessment He has had no issue with recurrent cellulitis after 3 months decolonization Discussed he can stop doing now which he did 3 months ago  If recurrent cellulitis can f/u with our clinic. Will consider restarting decolonization trial again  I have spent a total of 30 minutes of face-to-face and non-face-to-face time, excluding clinical staff time, preparing to see patient,  ordering tests and/or medications, and provide counseling the patient   Follow-up: Return if symptoms worsen or fail to improve.  Raymondo Band, MD [facility] 07/15/2022, 10:43 AM

## 2022-07-22 ENCOUNTER — Ambulatory Visit: Payer: PPO | Admitting: Nurse Practitioner

## 2022-07-23 ENCOUNTER — Other Ambulatory Visit: Payer: Self-pay | Admitting: Internal Medicine

## 2022-07-24 ENCOUNTER — Ambulatory Visit: Payer: PPO | Admitting: Internal Medicine

## 2022-07-29 NOTE — Progress Notes (Unsigned)
Office Visit    Patient Name: George Alvarez [facility] Date of Encounter: 07/30/2022  Primary Care Provider:  Wanda Plump, MD Primary Cardiologist:  George Carrow, MD Primary Electrophysiologist: None   Past Medical History    Past Medical History:  Diagnosis Date   Allergy    seasonal   Anal fissure    Arthritis    Asthma, chronic 06/16/2007   Qualifier: Diagnosis of  By: George Ren MD, George Alvarez   Onset:as child Triggers (environmental, infectious, allergic): all, mainly environmental triggers Rescue inhaler ZOX:WRUEAV Maintenance medications/ response:Singulair,Qvar Smoking history:never Family history pulmonary disease: no     Basal cell carcinoma of chest wall 2016   1.8 cm, treated with electrodessication and currettage   Benign paroxysmal positional vertigo 11/19/2012   Diagnosed at [facility], [place]  Physical therapy appointment pending    BPH (benign prostatic hyperplasia)    With urinary obstruction   Coronary artery disease    a. 02/2012 Cath/PCI: LM 10, LAD min irregs, LCX large, OM1 sm, 40 ost, OM2 95p (5.0x16 Veriflex & 4.5x12 Veriflex BMS'), RCA 30p, 81m, 30d, PDA/PLA min irregs, EF 65%   Diabetes mellitus without complication    Diverticulosis    Erectile dysfunction due to arterial insufficiency    Fundic gland polyps of stomach, benign 2010   GERD (gastroesophageal reflux disease)    History of elevated PSA    History of kidney stones    Hyperlipidemia    Hypertension    Nocturia    Perennial allergic rhinitis    Peyronie's disease    Skin cancer    Basal and squamous cell cancers, greater than 20   Stroke 2016   Tubular adenoma of colon 2010   Past Surgical History:  Procedure Laterality Date   COLONOSCOPY  2016   COLONOSCOPY  06/2020   CORONARY ANGIOPLASTY WITH STENT PLACEMENT  02/06/2012   OM2  bare metal    EPIDURAL BLOCK INJECTION  04-2015   Dr George Alvarez   epidural steroids  2007, 2009   X 2 @ cervical &, lumbar)   HERNIA REPAIR      INTRAVASCULAR ULTRASOUND  02/06/2012   Procedure: INTRAVASCULAR ULTRASOUND;  Surgeon: George Hazel, MD;  Location: [facility] CATH LAB;  Service: Cardiovascular;;   LEFT HEART CATH AND CORONARY ANGIOGRAPHY N/A 09/30/2019   Procedure: LEFT HEART CATH AND CORONARY ANGIOGRAPHY;  Surgeon: George Hazel, MD;  Location: [facility] INVASIVE CV LAB;  Service: Cardiovascular;  Laterality: N/A;   LEFT HEART CATHETERIZATION WITH CORONARY ANGIOGRAM N/A 02/06/2012   Procedure: LEFT HEART CATHETERIZATION WITH CORONARY ANGIOGRAM;  Surgeon: George Hazel, MD;  Location: [facility] CATH LAB;  Service: Cardiovascular;  Laterality: N/A;   LUMBAR LAMINECTOMY/DECOMPRESSION MICRODISCECTOMY N/A 10/21/2017   Procedure: Lumbar Two-Three, Lumbar Four-Five Discectomy;  Surgeon: George Memos, MD;  Location: [facility] OR;  Service: Neurosurgery;  Laterality: N/A;   PERCUTANEOUS CORONARY STENT INTERVENTION (PCI-S)  02/06/2012   Procedure: PERCUTANEOUS CORONARY STENT INTERVENTION (PCI-S);  Surgeon: George Hazel, MD;  Location: [facility] CATH LAB;  Service: Cardiovascular;;   septoplasty     TONSILLECTOMY AND ADENOIDECTOMY     TRIGGER FINGER RELEASE     UPPER GASTROINTESTINAL ENDOSCOPY  2011   gastric polyps   VASECTOMY      Allergies  Allergies  Allergen Reactions   Nifedipine Dermatitis and Rash    edema     History of Present Illness    George Alvarez  is a 77 year old male with a  PMH of CAD s/p LHC 02/06/2012 with 95% proximal OM2 lesion treated with BMS x 2 and medical management for 6% mid RCA lesion, CVA, HTN, HLD, GERD who presents today for annual follow-up of CAD.  George Alvarez has been followed by George Alvarez since 2013 when he underwent LHC due to abnormal Myoview. OM2 was very large in caliber and was treated with bare-metal stents x 2. Medical management of RCA stenosis which was felt to be moderate.  He had a repeat Myoview and 03/2014 that was normal.  He underwent a repeat LHC in 09/2019 for complaint of  chest pain that showed patent obtuse marginal stents, mild LAD disease and moderate RCA disease which was unchanged and not felt to be flow limiting. Normal LV systolic function.  He was last seen by George Alvarez 07/01/2021 for follow-up visit.  During visit patient reported no chest pain and blood pressure was well-controlled.  He had no medication changes made during visit.  George Alvarez presents today for 1 year follow-up alone.  Since last being seen in the office patient reports that he is doing well since previous visit but does note some occasional episodes of chest heaviness that occurs with increased stress.  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  He is experiencing tremendous amount of role strain with having to travel back and forth and has also experienced additional health concerns with Mohs surgery recently.  He is also undergoing decolonization program here at [facility] due to MRSA.  His blood pressure today is well-controlled at 110/72 and heart rate was 57 bpm.  He had recently taken his medications prior to his visit today.  He denies any medication abnormalities are adverse reactions.  He has been compliant with all of his medications since his previous visit.  He is also undergoing Fasenra injections for his asthma.  Patient denies chest pain, palpitations, dyspnea, PND, orthopnea, nausea, vomiting, dizziness, syncope, edema, weight gain, or early satiety.   Home Medications    Current Outpatient Medications  Medication Sig Dispense Refill   albuterol (VENTOLIN HFA) 108 (90 Base) MCG/ACT inhaler Inhale 2 puffs into the lungs every 4 (four) hours as needed for wheezing or shortness of breath. 18 g 1   aspirin EC 81 MG tablet Take 1 tablet (81 mg total) by mouth daily. 90 tablet 3   azelastine (ASTELIN) 0.1 % nasal spray Place 2 sprays into both nostrils at bedtime as needed for rhinitis or allergies. 30 mL 5   Benralizumab (FASENRA PEN) 30 MG/ML SOAJ  Inject 1 mL (30 mg total) into the skin every 28 (twenty-eight) days. For 2 more doses (1st dose 12/5 /23) then every 8 weeks 1 mL 8   Budeson-Glycopyrrol-Formoterol (BREZTRI AEROSPHERE) 160-9-4.8 MCG/ACT AERO Inhale 2 puffs into the lungs in the morning and at bedtime. 5.9 g 5   cetirizine (ZYRTEC) 10 MG tablet Take 10 mg by mouth daily.     chlorhexidine (HIBICLENS) 4 % external liquid Apply topically daily as needed. 120 mL 0   EPINEPHrine (EPIPEN 2-PAK) 0.3 mg/0.3 mL IJ SOAJ injection Use as directed for severe allergic reactions 2 each 3   fluticasone (FLONASE) 50 MCG/ACT nasal spray ONE SPRAY EACH NOSTRIL TWICE A DAY IF NEEDED FOR STUFFY NOSE. 48 mL 1   hydrocortisone 2.5 % cream Apply 1 application topically daily as needed (Eczema).     ketoconazole (NIZORAL) 2 % shampoo Apply 1 application topically once a week.  metoprolol tartrate (LOPRESSOR) 25 MG tablet Take 0.5 tablets (12.5 mg total) by mouth 2 (two) times daily. 90 tablet 1   montelukast (SINGULAIR) 10 MG tablet TAKE 1 TABLET BY MOUTH EVERYDAY AT BEDTIME 90 tablet 1   Multiple Vitamin (MULTIVITAMIN WITH MINERALS) TABS tablet Take 1 tablet by mouth daily.     mupirocin ointment (BACTROBAN) 2 % Apply 1 Application topically 2 (two) times daily. 22 g 0   olmesartan-hydrochlorothiazide (BENICAR HCT) 20-12.5 MG tablet Take 1 tablet by mouth daily. 90 tablet 1   Omega-3 Fatty Acids (FISH OIL) 1200 MG CAPS Take 1,200 mg by mouth daily.     pantoprazole (PROTONIX) 40 MG tablet Take 1 tablet (40 mg total) by mouth 2 (two) times daily. 180 tablet 3   rosuvastatin (CRESTOR) 40 MG tablet Take 1 tablet (40 mg total) by mouth daily. 90 tablet 1   tadalafil (CIALIS) 5 MG tablet Take 5 mg by mouth daily as needed.     tamsulosin (FLOMAX) 0.4 MG CAPS capsule Take 1 capsule (0.4 mg total) by mouth daily. 15 capsule 0   triamcinolone cream (KENALOG) 0.1 % Apply 1 application topically daily as needed (Eczema).      Current Facility-Administered  Medications  Medication Dose Route Frequency Provider Last Rate Last Admin   Benralizumab SOSY 30 mg  30 mg Subcutaneous Q8 weeks Ferol Luz, MD   30 mg at 07/01/22 1104     Review of Systems  Please see the history of present illness.    (+) Chest pressure (+) Chronic shortness of breath  All other systems reviewed and are otherwise negative except as noted above.  Physical Exam    Wt Readings from Last 3 Encounters:  07/30/22 232 lb 3.2 oz (105.3 kg)  07/15/22 228 lb 3.2 oz (103.5 kg)  01/23/22 227 lb (103 kg)   VS: Vitals:   07/30/22 1026  BP: 110/72  Pulse: (!) 57  SpO2: 95%  ,Body mass index is 33.32 kg/m.  Constitutional:      Appearance: Healthy appearance. Not in distress.  Neck:     Vascular: JVD normal.  Pulmonary:     Effort: Pulmonary effort is normal.     Breath sounds: No wheezing. No rales. Diminished in the bases Cardiovascular:     Normal rate. Regular rhythm. Normal S1. Normal S2.      Murmurs: There is no murmur.  Edema:    Peripheral edema absent.  Abdominal:     Palpations: Abdomen is soft non tender. There is no hepatomegaly.  Skin:    General: Skin is warm and dry.  Neurological:     General: No focal deficit present.     Mental Status: Alert and oriented to person, place and time.     Cranial Nerves: Cranial nerves are intact.  EKG/LABS/ Recent Cardiac Studies    ECG personally reviewed by me today -sinus bradycardia with first-degree AVB and rate of 57 bpm with T WI in leads III and aVF  Cardiac Studies & Procedures   CARDIAC CATHETERIZATION  CARDIAC CATHETERIZATION 09/30/2019  Narrative  Prox RCA-1 lesion is 30% stenosed.  Prox RCA-2 lesion is 60% stenosed.  Mid RCA lesion is 40% stenosed.  Mid LAD lesion is 30% stenosed.  2nd Mrg lesion is 20% stenosed.  1. Patent OM stents with minimal restenosis 2. Mild LAD disease 3. Moderate mid RCA stenosis, unchanged from last cath in 2013. This does not appear to be flow  limiting. 4. Normal LV systolic function  Recommendations: Continue medical management of CAD  Findings Coronary Findings Diagnostic  Dominance: Right  Left Anterior Descending Vessel is large. Mid LAD lesion is 30% stenosed.  Left Circumflex Vessel is large.  Second Obtuse Marginal Branch Vessel is large in size. 2nd Mrg lesion is 20% stenosed. The lesion was previously treated using a bare metal stent over 2 years ago.  Right Coronary Artery Vessel is large. Prox RCA-1 lesion is 30% stenosed. The lesion is calcified. Prox RCA-2 lesion is 60% stenosed. Mid RCA lesion is 40% stenosed. The lesion is calcified.  Intervention  No interventions have been documented.     ECHOCARDIOGRAM  ECHOCARDIOGRAM COMPLETE 04/29/2019  Narrative ECHOCARDIOGRAM REPORT    Patient Name:   REINALDO HELT Date of Exam: 04/29/2019 Medical Rec #:  [id number]        Height:       70.0 in Accession #:    [id number]       Weight:       219.8 lb Date of Birth:  05-Jul-1945       BSA:          2.17 m Patient Age:    77 years         BP:           108/68 mmHg Patient Gender: M                HR:           66 bpm. Exam Location:  [address]  Procedure: 2D Echo, Cardiac Doppler, Color Doppler and 3D Echo  Indications:    I25.10 CAD  History:        Patient has no prior history of Echocardiogram examinations. CAD, Stroke, Arrythmias:PVC; Risk Factors:Hypertension, Dyslipidemia and Family History of Coronary Artery Disease. Asthma.  Sonographer:    Farrel Conners RDCS Referring Phys: [id number] CHRISTOPHER D MCALHANY  IMPRESSIONS   1. Left ventricular ejection fraction, by visual estimation, is 60 to 65%. The left ventricle has normal function. There is no left ventricular hypertrophy. 2. Left ventricular diastolic parameters are consistent with Grade I diastolic dysfunction (impaired relaxation). 3. The left ventricle has no regional wall motion abnormalities. 4. Global right ventricle  has normal systolic function.The right ventricular size is normal. 5. Left atrial size was mildly dilated. 6. Right atrial size was normal. 7. The mitral valve is normal in structure. Trivial mitral valve regurgitation. No evidence of mitral stenosis. 8. The tricuspid valve is normal in structure. 9. The tricuspid valve is normal in structure. Tricuspid valve regurgitation is mild. 10. The aortic valve is tricuspid. Aortic valve regurgitation is not visualized. Mild aortic valve sclerosis without stenosis. 11. The pulmonic valve was not well visualized. Pulmonic valve regurgitation is trivial. 12. Aortic dilatation noted. 13. There is mild dilatation of the ascending aorta measuring 42 mm. 14. Normal pulmonary artery systolic pressure. 15. The inferior vena cava is normal in size with greater than 50% respiratory variability, suggesting right atrial pressure of 3 mmHg. 16. Normal LV systolic function; grade 1 diastolic dysfunction; mildly dilated ascending aorta; mild LAE.  FINDINGS Left Ventricle: Left ventricular ejection fraction, by visual estimation, is 60 to 65%. The left ventricle has normal function. The left ventricle has no regional wall motion abnormalities. There is no left ventricular hypertrophy. Left ventricular diastolic parameters are consistent with Grade I diastolic dysfunction (impaired relaxation). Normal left atrial pressure.  Right Ventricle: The right ventricular size is normal.Global RV systolic function is has normal systolic function.  The tricuspid regurgitant velocity is 2.16 m/s, and with an assumed right atrial pressure of 3 mmHg, the estimated right ventricular systolic pressure is normal at 21.6 mmHg.  Left Atrium: Left atrial size was mildly dilated.  Right Atrium: Right atrial size was normal in size  Pericardium: There is no evidence of pericardial effusion.  Mitral Valve: The mitral valve is normal in structure. Trivial mitral valve regurgitation. No  evidence of mitral valve stenosis by observation.  Tricuspid Valve: The tricuspid valve is normal in structure. Tricuspid valve regurgitation is mild.  Aortic Valve: The aortic valve is tricuspid. Aortic valve regurgitation is not visualized. Mild aortic valve sclerosis is present, with no evidence of aortic valve stenosis.  Pulmonic Valve: The pulmonic valve was not well visualized. Pulmonic valve regurgitation is trivial. Pulmonic regurgitation is trivial.  Aorta: Aortic dilatation noted. There is mild dilatation of the ascending aorta measuring 42 mm.  Venous: The inferior vena cava is normal in size with greater than 50% respiratory variability, suggesting right atrial pressure of 3 mmHg.  IAS/Shunts: No atrial level shunt detected by color flow Doppler.  Additional Comments: Normal LV systolic function; grade 1 diastolic dysfunction; mildly dilated ascending aorta; mild LAE.  LEFT VENTRICLE PLAX 2D LVIDd:         4.61 cm  Diastology LVIDs:         2.81 cm  LV e' lateral:   7.40 cm/s LV PW:         0.98 cm  LV E/e' lateral: 7.3 LV IVS:        0.98 cm  LV e' medial:    8.16 cm/s LVOT diam:     2.40 cm  LV E/e' medial:  6.6 LV SV:         68 ml LV SV Index:   30.15 LVOT Area:     4.52 cm   RIGHT VENTRICLE RV S prime:     14.00 cm/s TAPSE (M-mode): 2.8 cm  LEFT ATRIUM             Index       RIGHT ATRIUM           Index LA diam:        4.10 cm 1.89 cm/m  RA Area:     21.30 cm LA Vol (A2C):   66.6 ml 30.65 ml/m RA Volume:   66.10 ml  30.42 ml/m LA Vol (A4C):   84.7 ml 38.98 ml/m LA Biplane Vol: 82.3 ml 37.88 ml/m AORTIC VALVE LVOT Vmax:   127.00 cm/s LVOT Vmean:  82.200 cm/s LVOT VTI:    0.271 m  AORTA Ao Root diam: 3.60 cm Ao Asc diam:  4.20 cm  MITRAL VALVE                        TRICUSPID VALVE MV Area (PHT): cm                  TR Peak grad:   18.6 mmHg MV PHT:        msec                 TR Vmax:        231.00 cm/s MV Decel Time: 387 msec MV E velocity:  53.80 cm/s 103 cm/s  SHUNTS MV A velocity: 70.80 cm/s 70.3 cm/s Systemic VTI:  0.27 m MV E/A ratio:  0.76       1.5  Systemic Diam: 2.40 cm   Olga Millers MD Electronically signed by Olga Millers MD Signature Date/Time: 04/29/2019/12:54:27 PM    Final             Risk Assessment/Calculations:            Lab Results  Component Value Date   WBC 5.9 04/03/2021   HGB 14.4 04/03/2021   HCT 42.5 04/03/2021   MCV 89.6 04/03/2021   PLT 131.0 (L) 04/03/2021   Lab Results  Component Value Date   CREATININE 1.22 01/15/2022   BUN 20 01/15/2022   NA 137 01/15/2022   K 4.1 01/15/2022   CL 101 01/15/2022   CO2 28 01/15/2022   Lab Results  Component Value Date   ALT 25 04/03/2021   AST 27 04/03/2021   ALKPHOS 50 04/03/2021   BILITOT 0.7 04/03/2021   Lab Results  Component Value Date   CHOL 117 04/03/2021   HDL 34.30 (L) 04/03/2021   LDLCALC 42 04/12/2019   LDLDIRECT 59.0 04/03/2021   TRIG 252.0 (H) 04/03/2021   CHOLHDL 3 04/03/2021    Lab Results  Component Value Date   HGBA1C 6.7 (H) 10/02/2021     Assessment & Plan    1.  Coronary artery disease/stable angina: -s/p BMS x 2 to 95% OM2 lesion in 2013 and recent left heart cath 09/2019 showing patent stent and no flow-limiting lesions. -Today patient reports some increased chest pressure with stressful situations and events.  He has not required any as needed Nitrostat for his discomfort.  He also has noted some TWI in leads III and aVF which are new compared to his previous EKG. -We will send patient for [facility] for evaluation -Continue GDMT with ASA 81 mg daily 12.5 mg twice daily Crestor 40 mg daily -We will add Nitrostat 0.4 mg as needed and patient was provided ED return precautions if chest discomfort changes in intensity and is not relieved with Nitrostat.  2.  Essential hypertension: -Patient's blood pressure today was well-controlled today at 110/72 -Continue Lopressor 12.5 mg twice daily  and Benicar 20-12.5 mg p.o. daily  3.  Hyperlipidemia: -Patient's LDL cholesterol was 42 -Continue Crestor 40 mg daily  4.  DM type II: -Patient's last hemoglobin A1c was 6.7 -Continue current treatment plan per PCP  5.  History of asthma: -Patient recently started on Fasenra pen and is followed by PCP   Disposition: Follow-up with George Carrow, MD or APP in 6 months Shared Decision Making/Informed Consent The risks [chest pain, shortness of breath, cardiac arrhythmias, dizziness, blood pressure fluctuations, myocardial infarction, stroke/transient ischemic attack, nausea, vomiting, allergic reaction, radiation exposure, metallic taste sensation and life-threatening complications (estimated to be 1 in 10,000)], benefits (risk stratification, diagnosing coronary artery disease, treatment guidance) and alternatives of a nuclear stress test were discussed in detail with Mr. Wey and he agrees to proceed.   Medication Adjustments/Labs and Tests Ordered: Current medicines are reviewed at length with the patient today.  Concerns regarding medicines are outlined above.   Signed, Napoleon Form, Leodis Rains, NP 07/30/2022, 11:05 AM [facility]

## 2022-07-30 ENCOUNTER — Ambulatory Visit: Payer: PPO | Attending: Nurse Practitioner | Admitting: Nurse Practitioner

## 2022-07-30 ENCOUNTER — Encounter: Payer: Self-pay | Admitting: Nurse Practitioner

## 2022-07-30 VITALS — BP 110/72 | HR 57 | Ht 70.0 in | Wt 232.2 lb

## 2022-07-30 DIAGNOSIS — J45909 Unspecified asthma, uncomplicated: Secondary | ICD-10-CM | POA: Diagnosis not present

## 2022-07-30 DIAGNOSIS — I25118 Atherosclerotic heart disease of native coronary artery with other forms of angina pectoris: Secondary | ICD-10-CM

## 2022-07-30 DIAGNOSIS — E785 Hyperlipidemia, unspecified: Secondary | ICD-10-CM

## 2022-07-30 DIAGNOSIS — I2510 Atherosclerotic heart disease of native coronary artery without angina pectoris: Secondary | ICD-10-CM

## 2022-07-30 DIAGNOSIS — I10 Essential (primary) hypertension: Secondary | ICD-10-CM

## 2022-07-30 DIAGNOSIS — E119 Type 2 diabetes mellitus without complications: Secondary | ICD-10-CM

## 2022-07-30 DIAGNOSIS — I2089 Other forms of angina pectoris: Secondary | ICD-10-CM

## 2022-07-30 MED ORDER — NITROGLYCERIN 0.4 MG SL SUBL: 0.4000 mg | SUBLINGUAL_TABLET | SUBLINGUAL | 3 refills | Status: DC | PRN

## 2022-07-30 NOTE — Patient Instructions (Signed)
Medication Instructions:  START Nitroglycerin 0.4mg  Take 1 as needed for emergency chest pain. Take first dose for emergency chest pain; WAIT 5 minutes and if still having chest pain CALL 911 and then take 2nd dose. IF still having pain wait an additional 5 minutes before taking final dose. Do not take more than 3 doses in a day. *If you need a refill on your cardiac medications before your next appointment, please call your pharmacy*   Lab Work: None ordered   Testing/Procedures: Your physician has requested that you have an exercise stress myoview. For further information please visit [email or web address] Please follow instruction sheet, as given.   Follow-Up: At [facility], you and your health needs are our priority.  As part of our continuing mission to provide you with exceptional heart care, we have created designated Provider Care Teams.  These Care Teams include your primary Cardiologist (physician) and Advanced Practice Providers (APPs -  Physician Assistants and Nurse Practitioners) who all work together to provide you with the care you need, when you need it.  We recommend signing up for the patient portal called "MyChart".  Sign up information is provided on this After Visit Summary.  MyChart is used to connect with patients for Virtual Visits (Telemedicine).  Patients are able to view lab/test results, encounter notes, upcoming appointments, etc.  Non-urgent messages can be sent to your provider as well.   To learn more about what you can do with MyChart, go to [email or web address].    Your next appointment:   6 month(s)  Provider:   Verne Carrow, MD     Other Instructions

## 2022-07-31 ENCOUNTER — Telehealth (HOSPITAL_COMMUNITY): Payer: Self-pay | Admitting: *Deleted

## 2022-07-31 ENCOUNTER — Ambulatory Visit (INDEPENDENT_AMBULATORY_CARE_PROVIDER_SITE_OTHER): Payer: PPO | Admitting: Podiatry

## 2022-07-31 ENCOUNTER — Encounter: Payer: Self-pay | Admitting: Podiatry

## 2022-07-31 DIAGNOSIS — M79675 Pain in left toe(s): Secondary | ICD-10-CM

## 2022-07-31 DIAGNOSIS — M79674 Pain in right toe(s): Secondary | ICD-10-CM

## 2022-07-31 DIAGNOSIS — B351 Tinea unguium: Secondary | ICD-10-CM | POA: Diagnosis not present

## 2022-07-31 DIAGNOSIS — E119 Type 2 diabetes mellitus without complications: Secondary | ICD-10-CM

## 2022-07-31 NOTE — Progress Notes (Signed)
Subjective:  Patient ID: George Alvarez, male    DOB: May 12, 1945,   MRN: [id number]  Chief Complaint  Patient presents with   Nail Problem    Patient came in today for routine foot care, nail trim     77 y.o. male presents for diabetic foot exam and  concern of thickened elongated and painful nails that are difficult to trim.   Relates burning and tingling in their feet. Patient is diabetic and last A1c was  Lab Results  Component Value Date   HGBA1C 6.7 (H) 10/02/2021   .   PCP:  Wanda Plump, MD    . Denies any other pedal complaints. Denies n/v/f/c.   Past Medical History:  Diagnosis Date   Allergy    seasonal   Anal fissure    Arthritis    Asthma, chronic 06/16/2007   Qualifier: Diagnosis of  By: Alwyn Ren MD, Chrissie Noa   Onset:as child Triggers (environmental, infectious, allergic): all, mainly environmental triggers Rescue inhaler WUJ:[id number] Maintenance medications/ response:Singulair,Qvar Smoking history:never Family history pulmonary disease: no     Basal cell carcinoma of chest wall 2016   1.8 cm, treated with electrodessication and currettage   Benign paroxysmal positional vertigo 11/19/2012   Diagnosed at [facility] [place]  Physical therapy appointment pending    BPH (benign prostatic hyperplasia)    With urinary obstruction   Coronary artery disease    a. 02/2012 Cath/PCI: LM 10, LAD min irregs, LCX large, OM1 sm, 40 ost, OM2 95p (5.0x16 Veriflex & 4.5x12 Veriflex BMS'), RCA 30p, 76m, 30d, PDA/PLA min irregs, EF 65%   Diabetes mellitus without complication    Diverticulosis    Erectile dysfunction due to arterial insufficiency    Fundic gland polyps of stomach, benign 2010   GERD (gastroesophageal reflux disease)    History of elevated PSA    History of kidney stones    Hyperlipidemia    Hypertension    Nocturia    Perennial allergic rhinitis    Peyronie's disease    Skin cancer    Basal and squamous cell cancers, greater than 20   Stroke 2016    Tubular adenoma of colon 2010    Objective:  Physical Exam: Vascular: DP/PT pulses 2/4 bilateral. CFT <3 seconds. Absent hair growth on digits. Edema noted to bilateral lower extremities. Xerosis noted bilaterally.  Skin. No lacerations or abrasions bilateral feet. Nails 1-5 bilateral  are thickened discolored and elongated with subungual debris. Small area of hemorrhage from cutting himself while attempting to trim his nails.  Musculoskeletal: MMT 5/5 bilateral lower extremities in DF, PF, Inversion and Eversion. Deceased ROM in DF of ankle joint.  Neurological: Sensation intact to light touch. Protective sensation diminished bilateral.    Assessment:   1. Pain due to onychomycosis of toenails of both feet   2. Diabetes mellitus without complication       Plan:  Patient was evaluated and treated and all questions answered. -Discussed and educated patient on diabetic foot care, especially with  regards to the vascular, neurological and musculoskeletal systems.  -Stressed the importance of good glycemic control and the detriment of not  controlling glucose levels in relation to the foot. -Discussed supportive shoes at all times and checking feet regularly.  -Mechanically debrided all nails 1-5 bilateral using sterile nail nipper and filed with dremel without incident  -Neosporin and bandaid applied to small incident on his right fourth toe.  -Answered all patient questions -Patient to return  in 3  months for at risk foot care -Patient advised to call the office if any problems or questions arise in the meantime.   Louann Sjogren, DPM

## 2022-07-31 NOTE — Telephone Encounter (Signed)
Patient given detailed instructions per Myocardial Perfusion Study Information Sheet for the test on 08/04/2022 at 10:45. Patient notified to arrive 15 minutes early and that it is imperative to arrive on time for appointment to keep from having the test rescheduled.  If you need to cancel or reschedule your appointment, please call the office within 24 hours of your appointment. . Patient verbalized understanding.Daneil Dolin

## 2022-08-04 ENCOUNTER — Ambulatory Visit (HOSPITAL_COMMUNITY): Payer: PPO | Attending: Cardiology

## 2022-08-04 DIAGNOSIS — E785 Hyperlipidemia, unspecified: Secondary | ICD-10-CM

## 2022-08-04 DIAGNOSIS — J45909 Unspecified asthma, uncomplicated: Secondary | ICD-10-CM | POA: Diagnosis not present

## 2022-08-04 DIAGNOSIS — I10 Essential (primary) hypertension: Secondary | ICD-10-CM | POA: Insufficient documentation

## 2022-08-04 DIAGNOSIS — E119 Type 2 diabetes mellitus without complications: Secondary | ICD-10-CM | POA: Diagnosis not present

## 2022-08-04 DIAGNOSIS — I2510 Atherosclerotic heart disease of native coronary artery without angina pectoris: Secondary | ICD-10-CM | POA: Insufficient documentation

## 2022-08-04 LAB — MYOCARDIAL PERFUSION IMAGING
Estimated workload: 7
Exercise duration (min): 6 min
Exercise duration (sec): 0 s
LV dias vol: 86 mL (ref 62–150)
LV sys vol: 27 mL
MPHR: 146 {beats}/min
Nuc Stress EF: 68 %
Peak HR: 130 {beats}/min
Percent HR: 89 %
Rest HR: 65 {beats}/min
Rest Nuclear Isotope Dose: 10.4 mCi
SDS: 0
SRS: 0
SSS: 0
ST Depression (mm): 0 mm
Stress Nuclear Isotope Dose: 31.3 mCi
TID: 0.86

## 2022-08-04 MED ORDER — TECHNETIUM TC 99M TETROFOSMIN IV KIT
31.3000 | PACK | Freq: Once | INTRAVENOUS | Status: AC | PRN
  Administered 2022-08-04: 31.3 via INTRAVENOUS

## 2022-08-04 MED ORDER — TECHNETIUM TC 99M TETROFOSMIN IV KIT
10.4000 | PACK | Freq: Once | INTRAVENOUS | Status: AC | PRN
  Administered 2022-08-04: 10.4 via INTRAVENOUS

## 2022-08-07 ENCOUNTER — Other Ambulatory Visit: Payer: Self-pay | Admitting: Internal Medicine

## 2022-08-20 ENCOUNTER — Ambulatory Visit (INDEPENDENT_AMBULATORY_CARE_PROVIDER_SITE_OTHER): Payer: PPO | Admitting: Internal Medicine

## 2022-08-20 ENCOUNTER — Encounter: Payer: Self-pay | Admitting: Internal Medicine

## 2022-08-20 VITALS — BP 126/82 | HR 68 | Temp 98.1°F | Resp 18 | Ht 70.0 in | Wt 226.0 lb

## 2022-08-20 DIAGNOSIS — I10 Essential (primary) hypertension: Secondary | ICD-10-CM | POA: Diagnosis not present

## 2022-08-20 DIAGNOSIS — E119 Type 2 diabetes mellitus without complications: Secondary | ICD-10-CM | POA: Diagnosis not present

## 2022-08-20 DIAGNOSIS — Z0001 Encounter for general adult medical examination with abnormal findings: Secondary | ICD-10-CM

## 2022-08-20 DIAGNOSIS — Z0000 Encounter for general adult medical examination without abnormal findings: Secondary | ICD-10-CM

## 2022-08-20 DIAGNOSIS — M4800 Spinal stenosis, site unspecified: Secondary | ICD-10-CM | POA: Diagnosis not present

## 2022-08-20 DIAGNOSIS — R202 Paresthesia of skin: Secondary | ICD-10-CM | POA: Diagnosis not present

## 2022-08-20 DIAGNOSIS — E785 Hyperlipidemia, unspecified: Secondary | ICD-10-CM

## 2022-08-20 LAB — LIPID PANEL
Cholesterol: 103 mg/dL (ref 0–200)
HDL: 31.3 mg/dL — ABNORMAL LOW (ref >39.00)
NonHDL: 71.73
Total CHOL/HDL Ratio: 3
Triglycerides: 218 mg/dL — ABNORMAL HIGH (ref 0.0–149.0)
VLDL: 43.6 mg/dL — ABNORMAL HIGH (ref 0.0–40.0)

## 2022-08-20 LAB — LDL CHOLESTEROL, DIRECT: Direct LDL: 49 mg/dL

## 2022-08-20 LAB — MICROALBUMIN / CREATININE URINE RATIO
Creatinine,U: 74.8 mg/dL
Microalb Creat Ratio: 0.9 mg/g (ref 0.0–30.0)
Microalb, Ur: <0.7 mg/dL (ref 0.0–1.9)

## 2022-08-20 LAB — CBC WITH DIFFERENTIAL/PLATELET
Basophils Absolute: 0 10*3/uL (ref 0.0–0.1)
Basophils Relative: 0.1 % (ref 0.0–3.0)
Eosinophils Absolute: 0 10*3/uL (ref 0.0–0.7)
Eosinophils Relative: 0 % (ref 0.0–5.0)
HCT: 43.1 % (ref 39.0–52.0)
Hemoglobin: 14.7 g/dL (ref 13.0–17.0)
Lymphocytes Relative: 33 % (ref 12.0–46.0)
Lymphs Abs: 2.1 10*3/uL (ref 0.7–4.0)
MCHC: 34 g/dL (ref 30.0–36.0)
MCV: 87.8 fl (ref 78.0–100.0)
Monocytes Absolute: 0.5 10*3/uL (ref 0.1–1.0)
Monocytes Relative: 7.8 % (ref 3.0–12.0)
Neutro Abs: 3.8 10*3/uL (ref 1.4–7.7)
Neutrophils Relative %: 59.1 % (ref 43.0–77.0)
Platelets: 153 10*3/uL (ref 150.0–400.0)
RBC: 4.91 Mil/uL (ref 4.22–5.81)
RDW: 14.4 % (ref 11.5–15.5)
WBC: 6.4 10*3/uL (ref 4.0–10.5)

## 2022-08-20 LAB — COMPREHENSIVE METABOLIC PANEL
ALT: 29 U/L (ref 0–53)
AST: 34 U/L (ref 0–37)
Albumin: 4.4 g/dL (ref 3.5–5.2)
Alkaline Phosphatase: 59 U/L (ref 39–117)
BUN: 16 mg/dL (ref 6–23)
CO2: 26 mEq/L (ref 19–32)
Calcium: 9.4 mg/dL (ref 8.4–10.5)
Chloride: 102 mEq/L (ref 96–112)
Creatinine, Ser: 1.36 mg/dL (ref 0.40–1.50)
GFR: 51.28 mL/min — ABNORMAL LOW (ref >60.00)
Glucose, Bld: 122 mg/dL — ABNORMAL HIGH (ref 70–99)
Potassium: 4.2 mEq/L (ref 3.5–5.1)
Sodium: 138 mEq/L (ref 135–145)
Total Bilirubin: 0.7 mg/dL (ref 0.2–1.2)
Total Protein: 6.7 g/dL (ref 6.0–8.3)

## 2022-08-20 LAB — TSH: TSH: 3 u[IU]/mL (ref 0.35–5.50)

## 2022-08-20 LAB — B12 AND FOLATE PANEL
Folate: 16 ng/mL (ref >5.9)
Vitamin B-12: 332 pg/mL (ref 211–911)

## 2022-08-20 LAB — HEMOGLOBIN A1C: Hgb A1c MFr Bld: 7 % — ABNORMAL HIGH (ref 4.6–6.5)

## 2022-08-20 NOTE — Patient Instructions (Addendum)
Please reach out to Dr. Franky Macho.  Please read the information about fall prevention.   Vaccines I recommend: RSV COVID booster if not done in the last few months Flu shot every fall  Check the  blood pressure regularly BP GOAL is between 110/65 and  135/85. If it is consistently higher or lower, let me know     GO TO THE LAB : Get the blood work     GO TO THE FRONT DESK, PLEASE SCHEDULE YOUR APPOINTMENTS Come back for a checkup in 4 months.

## 2022-08-20 NOTE — Progress Notes (Unsigned)
Subjective:    Patient ID: George Alvarez, male    DOB: 09-06-1945, 77 y.o.   MRN: [id number]  DOS:  08/20/2022 Type of visit - description: cpx  Here for CPX. Recently saw cardiology, note and tests reviewed. Had a URI last week, feeling better, mild persisting cough but otherwise doing well.  He is concerned about the following symptoms: For the last year his dexterity has diminished and he frequently drops things. He has a long history for spinal stenosis but currently no neck pain.  He does have some occasional left back pain. Also is not very steady and occasionally feels wobbly.  No falls. Occasionally has paresthesias more at the feet that the hands but they are usually triggered by the staying in the same position for long peers of time.  States that he "does not have control of the bowels".  Usually in the mornings when he feels the need to have a BM it happens almost immediately but denies actual stool leaking. Has occasional bladder leaking but no incontinence per se  Review of Systems See above   Past Medical History:  Diagnosis Date   Allergy    seasonal   Anal fissure    Arthritis    Asthma, chronic 06/16/2007   Qualifier: Diagnosis of  By: Alwyn Ren MD, Chrissie Noa   Onset:as child Triggers (environmental, infectious, allergic): all, mainly environmental triggers Rescue inhaler OZH:[id number] Maintenance medications/ response:Singulair,Qvar Smoking history:never Family history pulmonary disease: no     Basal cell carcinoma of chest wall 2016   1.8 cm, treated with electrodessication and currettage   Benign paroxysmal positional vertigo 11/19/2012   Diagnosed at [facility], [place]  Physical therapy appointment pending    BPH (benign prostatic hyperplasia)    With urinary obstruction   Coronary artery disease    a. 02/2012 Cath/PCI: LM 10, LAD min irregs, LCX large, OM1 sm, 40 ost, OM2 95p (5.0x16 Veriflex & 4.5x12 Veriflex BMS'), RCA 30p, 67m, 30d, PDA/PLA min  irregs, EF 65%   Diabetes mellitus without complication (HCC)    Diverticulosis    Erectile dysfunction due to arterial insufficiency    Fundic gland polyps of stomach, benign 2010   GERD (gastroesophageal reflux disease)    History of elevated PSA    History of kidney stones    Hyperlipidemia    Hypertension    Nocturia    Perennial allergic rhinitis    Peyronie's disease    Skin cancer    Basal and squamous cell cancers, greater than 20   Stroke (HCC) 2016   Tubular adenoma of colon 2010    Past Surgical History:  Procedure Laterality Date   COLONOSCOPY  2016   COLONOSCOPY  06/2020   CORONARY ANGIOPLASTY WITH STENT PLACEMENT  02/06/2012   OM2  bare metal    EPIDURAL BLOCK INJECTION  04-2015   Dr Murray Hodgkins   epidural steroids  2007, 2009   X 2 @ cervical &, lumbar)   HERNIA REPAIR     INTRAVASCULAR ULTRASOUND  02/06/2012   Procedure: INTRAVASCULAR ULTRASOUND;  Surgeon: Kathleene Hazel, MD;  Location: [facility] CATH LAB;  Service: Cardiovascular;;   LEFT HEART CATH AND CORONARY ANGIOGRAPHY N/A 09/30/2019   Procedure: LEFT HEART CATH AND CORONARY ANGIOGRAPHY;  Surgeon: Kathleene Hazel, MD;  Location: [facility] INVASIVE CV LAB;  Service: Cardiovascular;  Laterality: N/A;   LEFT HEART CATHETERIZATION WITH CORONARY ANGIOGRAM N/A 02/06/2012   Procedure: LEFT HEART CATHETERIZATION WITH CORONARY ANGIOGRAM;  Surgeon: Nile Dear  Clifton James, MD;  Location: [facility] CATH LAB;  Service: Cardiovascular;  Laterality: N/A;   LUMBAR LAMINECTOMY/DECOMPRESSION MICRODISCECTOMY N/A 10/21/2017   Procedure: Lumbar Two-Three, Lumbar Four-Five Discectomy;  Surgeon: Coletta Memos, MD;  Location[facility] OR;  Service: Neurosurgery;  Laterality: N/A;   PERCUTANEOUS CORONARY STENT INTERVENTION (PCI-S)  02/06/2012   Procedure: PERCUTANEOUS CORONARY STENT INTERVENTION (PCI-S);  Surgeon: Kathleene Hazel, MD;  Location: [facility] CATH LAB;  Service: Cardiovascular;;   septoplasty     TONSILLECTOMY AND ADENOIDECTOMY      TRIGGER FINGER RELEASE     UPPER GASTROINTESTINAL ENDOSCOPY  2011   gastric polyps   VASECTOMY      Current Outpatient Medications  Medication Instructions   albuterol (VENTOLIN HFA) 108 (90 Base) MCG/ACT inhaler 2 puffs, Inhalation, Every 4 hours PRN   aspirin EC 81 mg, Oral, Daily   azelastine (ASTELIN) 0.1 % nasal spray 2 sprays, Each Nare, At bedtime PRN   Budeson-Glycopyrrol-Formoterol (BREZTRI AEROSPHERE) 160-9-4.8 MCG/ACT AERO 2 puffs, Inhalation, 2 times daily   cetirizine (ZYRTEC) 10 mg, Oral, Daily   chlorhexidine (HIBICLENS) 4 % external liquid Topical, Daily PRN   EPINEPHrine (EPIPEN 2-PAK) 0.3 mg/0.3 mL IJ SOAJ injection Use as directed for severe allergic reactions   Fasenra Pen 30 mg, Subcutaneous, Every 28 days, For 2 more doses (1st dose 12/5 /23) then every 8 weeks   Fish Oil 1,200 mg, Oral, Daily   fluticasone (FLONASE) 50 MCG/ACT nasal spray ONE SPRAY EACH NOSTRIL TWICE A DAY IF NEEDED FOR STUFFY NOSE.   hydrocortisone 2.5 % cream 1 application , Topical, Daily PRN   ketoconazole (NIZORAL) 2 % shampoo 1 application , Topical, Weekly   metoprolol tartrate (LOPRESSOR) 12.5 mg, Oral, 2 times daily   montelukast (SINGULAIR) 10 mg, Oral, Daily at bedtime   Multiple Vitamin (MULTIVITAMIN WITH MINERALS) TABS tablet 1 tablet, Oral, Daily   mupirocin ointment (BACTROBAN) 2 % 1 Application, Topical, 2 times daily   nitroGLYCERIN (NITROSTAT) 0.4 mg, Sublingual, Every 5 min PRN   olmesartan-hydrochlorothiazide (BENICAR HCT) 20-12.5 MG tablet 1 tablet, Oral, Daily   pantoprazole (PROTONIX) 40 mg, Oral, 2 times daily   rosuvastatin (CRESTOR) 40 mg, Oral, Daily   tadalafil (CIALIS) 5 mg, Oral, Daily PRN   tamsulosin (FLOMAX) 0.4 mg, Oral, Daily   triamcinolone cream (KENALOG) 0.1 % 1 application , Topical, Daily PRN       Objective:   Physical Exam BP 126/82   Pulse 68   Temp 98.1 F (36.7 C) (Oral)   Resp 18   Ht 5\' 10"  (1.778 m)   Wt 226 lb (102.5 kg)   SpO2 91%    BMI 32.43 kg/m  General: Well developed, NAD, BMI noted Neck: No  thyromegaly  HEENT:  Normocephalic . Face symmetric, atraumatic Lungs:  CTA B Normal respiratory effort, no intercostal retractions, no accessory muscle use. Heart: RRR,  no murmur.  Abdomen:  Not distended, soft, non-tender. No rebound or rigidity.   Lower extremities: no pretibial edema bilaterally  Skin: Exposed areas without rash. Not pale. Not jaundice Neurologic:  alert & oriented X3.  Speech normal, gait appropriate for age and unassisted Strength symmetric and appropriate for age. DTRs: Symmetric Psych: Cognition and judgment appear intact.  Cooperative with normal attention span and concentration.  Behavior appropriate. No anxious or depressed appearing.     Assessment    Assessment  DM (A1C 6.09 September 2020) HTN Hyperlipidemia CV: Dr Clifton James --CAD --Stroke, seen in a MRI Asthma- per allergist  [facility] --DJD knees  used to see Dr Thamas Jaegers 2016, local injections --Spinal stenosis (neck-back) - Dr Mikal Plane dx ~ 2008 via MRI neck, back, had local injections 2008; then 04-2015 -- lumbar laminectomy 2019 -- low back R nerve ablation ~ 2022 (helped w/ radiculopathy) GERD BPV, off balance (seen by neuro 2020)  GU: --Elevated PSA -- Dr Annabell Howells --Peyronie's  Disease Skin cancer : [facility] Dr Lenis Dickinson   PLAN: Here for CPX, see separate documentation DM: Diet controlled, check A1c HTN: BP today satisfactory, continue metoprolol Benicar HCT and check labs. High cholesterol: Continue Crestor, checking labs CAD: Saw cardiology 07/30/2022, note reviewed, had some chest discomfort, Myoview ordered, no ischemia noted, had a hypertensive response.  Recurrent cellulitis: Saw ID twice, was Rx  decolonization with mupirocin/chlorhexidine periodically. . Asthma: Sees the allergist regularly.  Medications were changed, doing well. Decreased dexterity, wobbly, poor control of BMs? Has a number of symptoms, the patient suspects  could be related to spinal stenosis.  Neurological exam with no red flags.  We agreed that he will reach out to Dr. Franky Macho, if he does not think symptoms are related to spinal stenosis will refer to neurology.  Check labs. RTC 4 months.  -Td 2019 - Pneumonia shot 2013 ;  Prevnar 2015;  PNM 20: 2022  - zostavax 2013;  s/p shingrex -Vaccines I recommend: RSV, COVID booster if not done by 12-2021, flu shot every fall. - Prostate Ca screening: per urology  -Colonoscopy in 2010, 08-2014,  C-scope 05/30/2020, next per GI - Labs: CMP FLP CBC A1c TSH B12 folic acid -ACP: Documents on file     di

## 2022-08-21 ENCOUNTER — Encounter: Payer: Self-pay | Admitting: Internal Medicine

## 2022-08-21 MED ORDER — METFORMIN HCL ER 500 MG PO TB24: 1000.0000 mg | ORAL_TABLET | Freq: Every day | ORAL | 1 refills | Status: DC

## 2022-08-21 NOTE — Assessment & Plan Note (Signed)
-  Td 2019 - Pneumonia shot 2013 ;  Prevnar 2015;  PNM 20: 2022  - zostavax 2013;  s/p shingrex -Vaccines I recommend: RSV, COVID booster if not done by 12-2021, flu shot every fall. - Prostate Ca screening: per urology  -Colonoscopy in 2010, 08-2014,  C-scope 05/30/2020, next per GI - Labs: CMP FLP CBC A1c TSH B12 folic acid -ACP: Documents on file

## 2022-08-21 NOTE — Assessment & Plan Note (Signed)
Here for CPX, see separate documentation DM: Diet controlled, check A1c HTN: BP today satisfactory, continue metoprolol Benicar HCT and check labs. High cholesterol: Continue Crestor, checking labs CAD: Saw cardiology 07/30/2022, note reviewed, had some chest discomfort, Myoview done: no ischemia  but had a hypertensive response.currently asx. Recurrent cellulitis: Saw ID twice, was Rx  decolonization with mupirocin/chlorhexidine periodically. Asthma: Sees the allergist regularly.  Medications were changed, doing well. Decreased dexterity, wobbly, poor control of BMs? Has a number of sxs, the patient suspects could be related to spinal stenosis.  Neurological exam with no red flags.  We agreed that he will reach out to Dr. Franky Macho, if he does not think symptoms are related to spinal stenosis will refer to neurology.  Check labs. RTC 4 months.

## 2022-08-26 ENCOUNTER — Ambulatory Visit (INDEPENDENT_AMBULATORY_CARE_PROVIDER_SITE_OTHER): Payer: PPO

## 2022-08-26 DIAGNOSIS — J4550 Severe persistent asthma, uncomplicated: Secondary | ICD-10-CM

## 2022-08-26 MED ORDER — BENRALIZUMAB 30 MG/ML ~~LOC~~ SOSY
30.0000 mg | PREFILLED_SYRINGE | SUBCUTANEOUS | Status: AC
  Administered 2022-08-26 – 2024-04-21 (×12): 30 mg via SUBCUTANEOUS

## 2022-09-10 DIAGNOSIS — L821 Other seborrheic keratosis: Secondary | ICD-10-CM | POA: Diagnosis not present

## 2022-09-10 DIAGNOSIS — D485 Neoplasm of uncertain behavior of skin: Secondary | ICD-10-CM | POA: Diagnosis not present

## 2022-09-10 DIAGNOSIS — Z85828 Personal history of other malignant neoplasm of skin: Secondary | ICD-10-CM | POA: Diagnosis not present

## 2022-09-10 DIAGNOSIS — L570 Actinic keratosis: Secondary | ICD-10-CM | POA: Diagnosis not present

## 2022-09-10 DIAGNOSIS — B078 Other viral warts: Secondary | ICD-10-CM | POA: Diagnosis not present

## 2022-09-11 ENCOUNTER — Ambulatory Visit (HOSPITAL_BASED_OUTPATIENT_CLINIC_OR_DEPARTMENT_OTHER)
Admission: RE | Admit: 2022-09-11 | Discharge: 2022-09-11 | Disposition: A | Discharge status: Home / Self Care | Payer: PPO | Source: Ambulatory Visit | Attending: Family | Admitting: Family

## 2022-09-11 ENCOUNTER — Ambulatory Visit (INDEPENDENT_AMBULATORY_CARE_PROVIDER_SITE_OTHER): Payer: PPO | Admitting: Family

## 2022-09-11 ENCOUNTER — Encounter: Payer: Self-pay | Admitting: Internal Medicine

## 2022-09-11 VITALS — BP 120/68 | HR 59 | Ht 70.0 in | Wt 223.0 lb

## 2022-09-11 DIAGNOSIS — R591 Generalized enlarged lymph nodes: Secondary | ICD-10-CM | POA: Diagnosis not present

## 2022-09-11 DIAGNOSIS — R590 Localized enlarged lymph nodes: Secondary | ICD-10-CM

## 2022-09-11 NOTE — Progress Notes (Signed)
George Alvarez is a 77 y.o. male with the following history as recorded in EpicCare:  Patient Active Problem List   Diagnosis Date Noted   Other allergic rhinitis 04/28/2022   Severe persistent asthma without complication 04/08/2022   Conjunctivitis 04/08/2022   TMJ (temporomandibular joint disorder) 09/17/2021   MRSA (methicillin resistant Staphylococcus aureus) infection 09/17/2021   Rash 08/16/2021   Diabetes mellitus without complication (HCC) 04/03/2021   Intrinsic atopic dermatitis 11/22/2019   Chest pain of uncertain etiology    Viral upper respiratory tract infection with cough 07/06/2017   Current use of beta blocker 08/09/2015   Moderate persistent asthma without complication 02/20/2015   Seasonal and perennial allergic rhinitis 02/20/2015   PCP NOTES >>> 01/11/2015   Cerebellar infarct (HCC) 06/26/2014   Annual physical exam 02/20/2014   Benign paroxysmal positional vertigo 11/19/2012   CAD (coronary artery disease) 02/07/2012   Gastroesophageal reflux disease without esophagitis 01/01/2009   SKIN CANCER, HX OF 12/28/2007   Hyperlipidemia 06/16/2007   Essential hypertension 06/16/2007   Asthma, chronic 06/16/2007   Spinal stenosis 03/01/2007   NEPHROLITHIASIS, HX OF 05/14/2006    Current Outpatient Medications  Medication Sig Dispense Refill   albuterol (VENTOLIN HFA) 108 (90 Base) MCG/ACT inhaler Inhale 2 puffs into the lungs every 4 (four) hours as needed for wheezing or shortness of breath. 18 g 1   aspirin EC 81 MG tablet Take 1 tablet (81 mg total) by mouth daily. 90 tablet 3   azelastine (ASTELIN) 0.1 % nasal spray Place 2 sprays into both nostrils at bedtime as needed for rhinitis or allergies. 30 mL 5   Benralizumab (FASENRA PEN) 30 MG/ML SOAJ Inject 1 mL (30 mg total) into the skin every 28 (twenty-eight) days. For 2 more doses (1st dose 12/5 /23) then every 8 weeks 1 mL 8   Budeson-Glycopyrrol-Formoterol (BREZTRI AEROSPHERE) 160-9-4.8 MCG/ACT AERO Inhale 2  puffs into the lungs in the morning and at bedtime. 5.9 g 5   cetirizine (ZYRTEC) 10 MG tablet Take 10 mg by mouth daily.     chlorhexidine (HIBICLENS) 4 % external liquid Apply topically daily as needed. 120 mL 0   doxycycline (ADOXA) 100 MG tablet Take 100 mg by mouth 2 (two) times daily.     EPINEPHrine (EPIPEN 2-PAK) 0.3 mg/0.3 mL IJ SOAJ injection Use as directed for severe allergic reactions 2 each 3   fluticasone (FLONASE) 50 MCG/ACT nasal spray ONE SPRAY EACH NOSTRIL TWICE A DAY IF NEEDED FOR STUFFY NOSE. 48 mL 1   hydrocortisone 2.5 % cream Apply 1 application topically daily as needed (Eczema).     ketoconazole (NIZORAL) 2 % shampoo Apply 1 application topically once a week.     metFORMIN (GLUCOPHAGE-XR) 500 MG 24 hr tablet Take 2 tablets (1,000 mg total) by mouth daily with breakfast. 180 tablet 1   metoprolol tartrate (LOPRESSOR) 25 MG tablet Take 0.5 tablets (12.5 mg total) by mouth 2 (two) times daily. 90 tablet 1   montelukast (SINGULAIR) 10 MG tablet Take 1 tablet (10 mg total) by mouth at bedtime. 90 tablet 1   Multiple Vitamin (MULTIVITAMIN WITH MINERALS) TABS tablet Take 1 tablet by mouth daily.     mupirocin ointment (BACTROBAN) 2 % Apply 1 Application topically 2 (two) times daily. 22 g 0   nitroGLYCERIN (NITROSTAT) 0.4 MG SL tablet Place 1 tablet (0.4 mg total) under the tongue every 5 (five) minutes as needed for chest pain. 25 tablet 3   olmesartan-hydrochlorothiazide (BENICAR HCT) 20-12.5 MG  tablet Take 1 tablet by mouth daily. 90 tablet 1   Omega-3 Fatty Acids (FISH OIL) 1200 MG CAPS Take 1,200 mg by mouth daily.     pantoprazole (PROTONIX) 40 MG tablet Take 1 tablet (40 mg total) by mouth 2 (two) times daily. 180 tablet 3   rosuvastatin (CRESTOR) 40 MG tablet Take 1 tablet (40 mg total) by mouth daily. 90 tablet 1   tadalafil (CIALIS) 5 MG tablet Take 5 mg by mouth daily as needed.     tamsulosin (FLOMAX) 0.4 MG CAPS capsule Take 1 capsule (0.4 mg total) by mouth daily.  15 capsule 0   triamcinolone cream (KENALOG) 0.1 % Apply 1 application topically daily as needed (Eczema).      Current Facility-Administered Medications  Medication Dose Route Frequency Provider Last Rate Last Admin   Benralizumab SOSY 30 mg  30 mg Subcutaneous Q8 Heath Lark, MD   30 mg at 08/26/22 1117    Allergies: Nifedipine  Past Medical History:  Diagnosis Date   Allergy    seasonal   Anal fissure    Arthritis    Asthma, chronic 06/16/2007   Qualifier: Diagnosis of  By: Alwyn Ren MD, Chrissie Noa   Onset:as child Triggers (environmental, infectious, allergic): all, mainly environmental triggers Rescue inhaler GEX:[id number] Maintenance medications/ response:Singulair,Qvar Smoking history:never Family history pulmonary disease: no     Basal cell carcinoma of chest wall 2016   1.8 cm, treated with electrodessication and currettage   Benign paroxysmal positional vertigo 11/19/2012   Diagnosed at [facility], [place]  Physical therapy appointment pending    BPH (benign prostatic hyperplasia)    With urinary obstruction   Coronary artery disease    a. 02/2012 Cath/PCI: LM 10, LAD min irregs, LCX large, OM1 sm, 40 ost, OM2 95p (5.0x16 Veriflex & 4.5x12 Veriflex BMS'), RCA 30p, 25m, 30d, PDA/PLA min irregs, EF 65%   Diabetes mellitus without complication (HCC)    Diverticulosis    Erectile dysfunction due to arterial insufficiency    Fundic gland polyps of stomach, benign 2010   GERD (gastroesophageal reflux disease)    History of elevated PSA    History of kidney stones    Hyperlipidemia    Hypertension    Nocturia    Perennial allergic rhinitis    Peyronie's disease    Skin cancer    Basal and squamous cell cancers, greater than 20   Stroke (HCC) 2016   Tubular adenoma of colon 2010    Past Surgical History:  Procedure Laterality Date   COLONOSCOPY  2016   COLONOSCOPY  06/2020   CORONARY ANGIOPLASTY WITH STENT PLACEMENT  02/06/2012   OM2  bare metal    EPIDURAL  BLOCK INJECTION  04-2015   Dr Torry Adamczak Hodgkins   epidural steroids  2007, 2009   X 2 @ cervical &, lumbar)   HERNIA REPAIR     INTRAVASCULAR ULTRASOUND  02/06/2012   Procedure: INTRAVASCULAR ULTRASOUND;  Surgeon: Kathleene Hazel, MD;  Location: [facility] CATH LAB;  Service: Cardiovascular;;   LEFT HEART CATH AND CORONARY ANGIOGRAPHY N/A 09/30/2019   Procedure: LEFT HEART CATH AND CORONARY ANGIOGRAPHY;  Surgeon: Kathleene Hazel, MD;  Location: [facility] INVASIVE CV LAB;  Service: Cardiovascular;  Laterality: N/A;   LEFT HEART CATHETERIZATION WITH CORONARY ANGIOGRAM N/A 02/06/2012   Procedure: LEFT HEART CATHETERIZATION WITH CORONARY ANGIOGRAM;  Surgeon: Kathleene Hazel, MD;  Location: [facility] CATH LAB;  Service: Cardiovascular;  Laterality: N/A;   LUMBAR LAMINECTOMY/DECOMPRESSION MICRODISCECTOMY N/A 10/21/2017  Procedure: Lumbar Two-Three, Lumbar Four-Five Discectomy;  Surgeon: Coletta Memos, MD;  Location: [facility] OR;  Service: Neurosurgery;  Laterality: N/A;   PERCUTANEOUS CORONARY STENT INTERVENTION (PCI-S)  02/06/2012   Procedure: PERCUTANEOUS CORONARY STENT INTERVENTION (PCI-S);  Surgeon: Kathleene Hazel, MD;  Location: [facility] CATH LAB;  Service: Cardiovascular;;   septoplasty     TONSILLECTOMY AND ADENOIDECTOMY     TRIGGER FINGER RELEASE     UPPER GASTROINTESTINAL ENDOSCOPY  2011   gastric polyps   VASECTOMY      Family History  Problem Relation Age of Onset   Heart attack Father 48       died @ 7   Heart disease Mother    Breast cancer Mother    Esophageal cancer Mother        Barrett's   Heart attack Maternal Grandmother        after 28   Dementia Maternal Grandmother        CVA   Breast cancer Maternal Grandmother    Diabetes Maternal Grandmother    Stroke Maternal Grandmother        in 7s   Heart attack Paternal Uncle 44   Breast cancer Maternal Aunt    Colon cancer Neg Hx    Prostate cancer Neg Hx    Colon polyps Neg Hx    Rectal cancer Neg Hx    Stomach cancer Neg Hx      Social History   Tobacco Use   Smoking status: Never   Smokeless tobacco: Never  Substance Use Topics   Alcohol use: Not Currently    Comment: Wine occasionally    Subjective:   Markedly swollen lymph node x 24 hours; is being treated for MRSA infection- has been back on Doxycycline and Bactroban x 24 hours; no fever; does not typically have lymph node involvement with MRSA infection- concerned about atypical presentation.   Objective:  Vitals:   09/11/22 1456  BP: 120/68  Pulse: (!) 59  SpO2: 96%  Weight: 223 lb (101.2 kg)  Height: 5\' 10"  (1.778 m)    General: Well developed, well nourished, in no acute distress  Skin : Warm and dry.  Head: Normocephalic and atraumatic  Eyes: Sclera and conjunctiva clear; pupils round and reactive to light; extraocular movements intact  Ears: External normal; canals clear; tympanic membranes normal  Oropharynx: Pink, supple. No suspicious lesions  Neck: Supple without thyromegaly, + cervical adenopathy  Lungs: Respirations unlabored; clear to auscultation bilaterally without wheeze, rales, rhonchi  CVS exam: normal rate and regular rhythm.  Neurologic: Alert and oriented; speech intact; face symmetrical; moves all extremities well; CNII-XII intact without focal deficit  Assessment:  1. Lymphadenopathy, cervical     Plan:  Suspect reactive to MRSA; will update cervical ultrasound and CXR due to atypical presentation; continue current antibiotic regimen; if lymph node persists, to consider ENT evaluation for further treatment.   No follow-ups on file.  Orders Placed This Encounter  Procedures   US Soft Tissue Head/Neck (NON-THYROID)    Standing Status:   Future    Number of Occurrences:   1    Standing Expiration Date:   09/11/2023    Order Specific Question:   Reason for Exam (SYMPTOM  OR DIAGNOSIS REQUIRED)    Answer:   reactive lymphadenopathy    Order Specific Question:   Preferred imaging location?    Answer:   [facility]    DG Chest 2 View    Standing Status:   Future  Number of Occurrences:   1    Standing Expiration Date:   09/11/2023    Order Specific Question:   Reason for Exam (SYMPTOM  OR DIAGNOSIS REQUIRED)    Answer:   cervical lymphadenopathy    Order Specific Question:   Preferred imaging location?    Answer:   [facility]   CBC with Differential/Platelet   Comp Met (CMET)    Requested Prescriptions    No prescriptions requested or ordered in this encounter

## 2022-09-12 LAB — COMPREHENSIVE METABOLIC PANEL
ALT: 18 U/L (ref 0–53)
AST: 23 U/L (ref 0–37)
Albumin: 4.4 g/dL (ref 3.5–5.2)
Alkaline Phosphatase: 47 U/L (ref 39–117)
BUN: 19 mg/dL (ref 6–23)
CO2: 25 mEq/L (ref 19–32)
Calcium: 9.5 mg/dL (ref 8.4–10.5)
Chloride: 104 mEq/L (ref 96–112)
Creatinine, Ser: 1.35 mg/dL (ref 0.40–1.50)
GFR: 51.71 mL/min — ABNORMAL LOW (ref >60.00)
Glucose, Bld: 120 mg/dL — ABNORMAL HIGH (ref 70–99)
Potassium: 4.1 mEq/L (ref 3.5–5.1)
Sodium: 138 mEq/L (ref 135–145)
Total Bilirubin: 0.7 mg/dL (ref 0.2–1.2)
Total Protein: 6.6 g/dL (ref 6.0–8.3)

## 2022-09-12 LAB — CBC WITH DIFFERENTIAL/PLATELET
Basophils Absolute: 0 10*3/uL (ref 0.0–0.1)
Basophils Relative: 0.2 % (ref 0.0–3.0)
Eosinophils Absolute: 0 10*3/uL (ref 0.0–0.7)
Eosinophils Relative: 0 % (ref 0.0–5.0)
HCT: 42.2 % (ref 39.0–52.0)
Hemoglobin: 14 g/dL (ref 13.0–17.0)
Lymphocytes Relative: 28.2 % (ref 12.0–46.0)
Lymphs Abs: 2 10*3/uL (ref 0.7–4.0)
MCHC: 33.1 g/dL (ref 30.0–36.0)
MCV: 88.5 fl (ref 78.0–100.0)
Monocytes Absolute: 0.6 10*3/uL (ref 0.1–1.0)
Monocytes Relative: 8.7 % (ref 3.0–12.0)
Neutro Abs: 4.4 10*3/uL (ref 1.4–7.7)
Neutrophils Relative %: 62.9 % (ref 43.0–77.0)
Platelets: 149 10*3/uL — ABNORMAL LOW (ref 150.0–400.0)
RBC: 4.76 Mil/uL (ref 4.22–5.81)
RDW: 14.7 % (ref 11.5–15.5)
WBC: 7 10*3/uL (ref 4.0–10.5)

## 2022-09-15 ENCOUNTER — Ambulatory Visit: Payer: PPO | Admitting: Internal Medicine

## 2022-09-15 ENCOUNTER — Other Ambulatory Visit: Payer: Self-pay | Admitting: Internal Medicine

## 2022-10-02 DIAGNOSIS — R972 Elevated prostate specific antigen [PSA]: Secondary | ICD-10-CM | POA: Diagnosis not present

## 2022-10-07 ENCOUNTER — Ambulatory Visit (INDEPENDENT_AMBULATORY_CARE_PROVIDER_SITE_OTHER): Payer: PPO

## 2022-10-07 DIAGNOSIS — J4550 Severe persistent asthma, uncomplicated: Secondary | ICD-10-CM | POA: Diagnosis not present

## 2022-10-18 ENCOUNTER — Other Ambulatory Visit: Payer: Self-pay | Admitting: Internal Medicine

## 2022-10-30 ENCOUNTER — Ambulatory Visit: Payer: PPO | Admitting: Podiatry

## 2022-11-18 ENCOUNTER — Other Ambulatory Visit: Payer: Self-pay | Admitting: Internal Medicine

## 2022-11-20 ENCOUNTER — Encounter (INDEPENDENT_AMBULATORY_CARE_PROVIDER_SITE_OTHER): Payer: Self-pay

## 2022-11-20 ENCOUNTER — Ambulatory Visit: Payer: PPO | Admitting: Podiatry

## 2022-12-02 ENCOUNTER — Ambulatory Visit: Payer: PPO

## 2022-12-03 ENCOUNTER — Ambulatory Visit: Payer: PPO

## 2022-12-05 ENCOUNTER — Ambulatory Visit (INDEPENDENT_AMBULATORY_CARE_PROVIDER_SITE_OTHER): Payer: PPO

## 2022-12-05 DIAGNOSIS — J4550 Severe persistent asthma, uncomplicated: Secondary | ICD-10-CM | POA: Diagnosis not present

## 2022-12-16 ENCOUNTER — Other Ambulatory Visit (HOSPITAL_BASED_OUTPATIENT_CLINIC_OR_DEPARTMENT_OTHER): Payer: Self-pay | Admitting: Neurosurgery

## 2022-12-16 ENCOUNTER — Encounter (HOSPITAL_BASED_OUTPATIENT_CLINIC_OR_DEPARTMENT_OTHER): Payer: Self-pay | Admitting: Neurosurgery

## 2022-12-16 DIAGNOSIS — M4802 Spinal stenosis, cervical region: Secondary | ICD-10-CM

## 2022-12-16 DIAGNOSIS — Z6833 Body mass index (BMI) 33.0-33.9, adult: Secondary | ICD-10-CM | POA: Diagnosis not present

## 2022-12-16 DIAGNOSIS — M47816 Spondylosis without myelopathy or radiculopathy, lumbar region: Secondary | ICD-10-CM | POA: Diagnosis not present

## 2022-12-21 ENCOUNTER — Ambulatory Visit (HOSPITAL_COMMUNITY)
Admission: RE | Admit: 2022-12-21 | Discharge: 2022-12-21 | Disposition: A | Discharge status: Home / Self Care | Payer: PPO | Source: Ambulatory Visit | Attending: Neurosurgery | Admitting: Neurosurgery

## 2022-12-21 DIAGNOSIS — M47816 Spondylosis without myelopathy or radiculopathy, lumbar region: Secondary | ICD-10-CM | POA: Insufficient documentation

## 2022-12-21 DIAGNOSIS — M50221 Other cervical disc displacement at C4-C5 level: Secondary | ICD-10-CM | POA: Diagnosis not present

## 2022-12-21 DIAGNOSIS — M47812 Spondylosis without myelopathy or radiculopathy, cervical region: Secondary | ICD-10-CM | POA: Diagnosis not present

## 2022-12-21 DIAGNOSIS — M4802 Spinal stenosis, cervical region: Secondary | ICD-10-CM

## 2022-12-21 DIAGNOSIS — M50222 Other cervical disc displacement at C5-C6 level: Secondary | ICD-10-CM | POA: Diagnosis not present

## 2022-12-21 DIAGNOSIS — M5135 Other intervertebral disc degeneration, thoracolumbar region: Secondary | ICD-10-CM | POA: Diagnosis not present

## 2022-12-21 DIAGNOSIS — M48061 Spinal stenosis, lumbar region without neurogenic claudication: Secondary | ICD-10-CM | POA: Diagnosis not present

## 2022-12-21 DIAGNOSIS — M5126 Other intervertebral disc displacement, lumbar region: Secondary | ICD-10-CM | POA: Diagnosis not present

## 2022-12-21 MED ORDER — GADOBUTROL 1 MMOL/ML IV SOLN
10.0000 mL | Freq: Once | INTRAVENOUS | Status: AC | PRN
  Administered 2022-12-21: 10 mL via INTRAVENOUS

## 2022-12-22 ENCOUNTER — Encounter: Payer: Self-pay | Admitting: Internal Medicine

## 2022-12-22 ENCOUNTER — Ambulatory Visit (INDEPENDENT_AMBULATORY_CARE_PROVIDER_SITE_OTHER): Payer: PPO | Admitting: Internal Medicine

## 2022-12-22 VITALS — BP 132/84 | HR 55 | Temp 98.5°F | Resp 18 | Ht 70.0 in | Wt 219.4 lb

## 2022-12-22 DIAGNOSIS — R152 Fecal urgency: Secondary | ICD-10-CM | POA: Diagnosis not present

## 2022-12-22 DIAGNOSIS — Z7984 Long term (current) use of oral hypoglycemic drugs: Secondary | ICD-10-CM | POA: Diagnosis not present

## 2022-12-22 DIAGNOSIS — R159 Full incontinence of feces: Secondary | ICD-10-CM | POA: Diagnosis not present

## 2022-12-22 DIAGNOSIS — R2232 Localized swelling, mass and lump, left upper limb: Secondary | ICD-10-CM | POA: Diagnosis not present

## 2022-12-22 DIAGNOSIS — E119 Type 2 diabetes mellitus without complications: Secondary | ICD-10-CM | POA: Diagnosis not present

## 2022-12-22 LAB — HEMOGLOBIN A1C: Hgb A1c MFr Bld: 6.8 % — ABNORMAL HIGH (ref 4.6–6.5)

## 2022-12-22 NOTE — Patient Instructions (Addendum)
Vaccines I recommend: Covid booster- new this fall Flu shot this fall  We are referring you to the gastroenterologist regarding her bowel movements.  [phone] [id number]  If the lump in your left arm gets harder, larger: Let me know   Check the  blood pressure regularly Blood pressure goal:  between 110/65 and  135/85. If it is consistently higher or lower, let me know     GO TO THE LAB : Get the blood work     Next visit with me in 3 to 4 months Please schedule it at the front desk

## 2022-12-22 NOTE — Assessment & Plan Note (Signed)
DM: Last A1c was elevated, recommended to start metformin but has not done that just yet.  Has improved his diet and lost some weight.  We had a long discussion about what his diabetes, how to treat it etc.  Plan: Check A1c, start medications with results (unless A1c has improved significantly). Decreased dexterity, wobbly, poor control of BMs: See LOV, he reported above sxs, saw neurosurgery Dr. Franky Macho, he was recommended to see GI ref   stool incontinence.  He did notice his gait was unsteady.  Patient also complaining of left buttock pain.  Neurosurgery ordered cervical and lumbar MRIs which are pending. Plan  Refer to GI.  See above. Lump, L arm: new, Etiology unclear, he just noticed it yesterday.  For now recommend observation. Right parotid infection?  Was seen by Vernona Rieger few weeks ago, see [name], sxs resolved. Vaccine advice provided. RTC 4 months

## 2022-12-22 NOTE — Progress Notes (Signed)
Subjective:    Patient ID: George Alvarez, male    DOB: 03-24-1946, 77 y.o.   MRN: [id number]  DOS:  12/22/2022 Type of visit - description: rov  Today we talk about his chronic medical problems and follow-up on his concerns from the last visit. He saw neurosurgery, I did not find an office visit note in the chart.  Was seen with a right parotid inflammation: Completely resolved.  Also, noted lump at the left arm yesterday.  Not painful.  Review of Systems See above   Past Medical History:  Diagnosis Date   Allergy    seasonal   Anal fissure    Arthritis    Asthma, chronic 06/16/2007   Qualifier: Diagnosis of  By: Alwyn Ren MD, Chrissie Noa   Onset:as child Triggers (environmental, infectious, allergic): all, mainly environmental triggers Rescue inhaler VOZ:DGUYQI Maintenance medications/ response:Singulair,Qvar Smoking history:never Family history pulmonary disease: no     Basal cell carcinoma of chest wall 2016   1.8 cm, treated with electrodessication and currettage   Benign paroxysmal positional vertigo 11/19/2012   Diagnosed at [facility], [place]  Physical therapy appointment pending    BPH (benign prostatic hyperplasia)    With urinary obstruction   Coronary artery disease    a. 02/2012 Cath/PCI: LM 10, LAD min irregs, LCX large, OM1 sm, 40 ost, OM2 95p (5.0x16 Veriflex & 4.5x12 Veriflex BMS'), RCA 30p, 28m, 30d, PDA/PLA min irregs, EF 65%   Diabetes mellitus without complication (HCC)    Diverticulosis    Erectile dysfunction due to arterial insufficiency    Fundic gland polyps of stomach, benign 2010   GERD (gastroesophageal reflux disease)    History of elevated PSA    History of kidney stones    Hyperlipidemia    Hypertension    Nocturia    Perennial allergic rhinitis    Peyronie's disease    Skin cancer    Basal and squamous cell cancers, greater than 20   Stroke (HCC) 2016   Tubular adenoma of colon 2010    Past Surgical History:  Procedure  Laterality Date   COLONOSCOPY  2016   COLONOSCOPY  06/2020   CORONARY ANGIOPLASTY WITH STENT PLACEMENT  02/06/2012   OM2  bare metal    EPIDURAL BLOCK INJECTION  04-2015   Dr Murray Hodgkins   epidural steroids  2007, 2009   X 2 @ cervical &, lumbar)   HERNIA REPAIR     INTRAVASCULAR ULTRASOUND  02/06/2012   Procedure: INTRAVASCULAR ULTRASOUND;  Surgeon: Kathleene Hazel, MD;  Location: [facility] CATH LAB;  Service: Cardiovascular;;   LEFT HEART CATH AND CORONARY ANGIOGRAPHY N/A 09/30/2019   Procedure: LEFT HEART CATH AND CORONARY ANGIOGRAPHY;  Surgeon: Kathleene Hazel, MD;  Location: [facility] INVASIVE CV LAB;  Service: Cardiovascular;  Laterality: N/A;   LEFT HEART CATHETERIZATION WITH CORONARY ANGIOGRAM N/A 02/06/2012   Procedure: LEFT HEART CATHETERIZATION WITH CORONARY ANGIOGRAM;  Surgeon: Kathleene Hazel, MD;  Location: [facility] CATH LAB;  Service: Cardiovascular;  Laterality: N/A;   LUMBAR LAMINECTOMY/DECOMPRESSION MICRODISCECTOMY N/A 10/21/2017   Procedure: Lumbar Two-Three, Lumbar Four-Five Discectomy;  Surgeon: Coletta Memos, MD;  Location: [facility] OR;  Service: Neurosurgery;  Laterality: N/A;   PERCUTANEOUS CORONARY STENT INTERVENTION (PCI-S)  02/06/2012   Procedure: PERCUTANEOUS CORONARY STENT INTERVENTION (PCI-S);  Surgeon: Kathleene Hazel, MD;  Location: [facility] CATH LAB;  Service: Cardiovascular;;   septoplasty     TONSILLECTOMY AND ADENOIDECTOMY     TRIGGER FINGER RELEASE     UPPER  GASTROINTESTINAL ENDOSCOPY  2011   gastric polyps   VASECTOMY      Current Outpatient Medications  Medication Instructions   albuterol (VENTOLIN HFA) 108 (90 Base) MCG/ACT inhaler 2 puffs, Inhalation, Every 4 hours PRN   aspirin EC 81 mg, Oral, Daily   Azelastine HCl 137 MCG/SPRAY SOLN PLACE 2 SPRAYS INTO BOTH NOSTRILS AT BEDTIME AS NEEDED FOR RHINITIS OR ALLERGIES.   Budeson-Glycopyrrol-Formoterol (BREZTRI AEROSPHERE) 160-9-4.8 MCG/ACT AERO 2 puffs, Inhalation, 2 times daily   cetirizine (ZYRTEC) 10 mg,  Oral, Daily   chlorhexidine (HIBICLENS) 4 % external liquid Topical, Daily PRN   doxycycline (ADOXA) 100 mg, Oral, 2 times daily   EPINEPHrine (EPIPEN 2-PAK) 0.3 mg/0.3 mL IJ SOAJ injection Use as directed for severe allergic reactions   Fasenra Pen 30 mg, Subcutaneous, Every 28 days, For 2 more doses (1st dose 12/5 /23) then every 8 weeks   Fish Oil 1,200 mg, Oral, Daily   fluticasone (FLONASE) 50 MCG/ACT nasal spray ONE SPRAY EACH NOSTRIL TWICE A DAY IF NEEDED FOR STUFFY NOSE.   hydrocortisone 2.5 % cream 1 application , Topical, Daily PRN   ketoconazole (NIZORAL) 2 % shampoo 1 application , Topical, Weekly   metFORMIN (GLUCOPHAGE-XR) 1,000 mg, Oral, Daily with breakfast   metoprolol tartrate (LOPRESSOR) 12.5 mg, Oral, 2 times daily   montelukast (SINGULAIR) 10 mg, Oral, Daily at bedtime   Multiple Vitamin (MULTIVITAMIN WITH MINERALS) TABS tablet 1 tablet, Oral, Daily   mupirocin ointment (BACTROBAN) 2 % 1 Application, Topical, 2 times daily   nitroGLYCERIN (NITROSTAT) 0.4 mg, Sublingual, Every 5 min PRN   olmesartan-hydrochlorothiazide (BENICAR HCT) 20-12.5 MG tablet 1 tablet, Oral, Daily   pantoprazole (PROTONIX) 40 mg, Oral, 2 times daily   rosuvastatin (CRESTOR) 40 mg, Oral, Daily   tadalafil (CIALIS) 5 mg, Oral, Daily PRN   tamsulosin (FLOMAX) 0.4 mg, Oral, Daily   triamcinolone cream (KENALOG) 0.1 % 1 application , Topical, Daily PRN       Objective:   Physical Exam BP 132/84   Pulse (!) 55   Temp 98.5 F (36.9 C) (Oral)   Resp 18   Ht 5\' 10"  (1.778 m)   Wt 219 lb 6 oz (99.5 kg)   SpO2 97%   BMI 31.48 kg/m  General:   Well developed, NAD, BMI noted. HEENT:  Normocephalic . Face symmetric, atraumatic Parotids: Normal to palpation. Upper extremities. Has a soft, nontender lump at the left elbow, flexure area.  Does not seem to be attached to deeper structures.  Does not seem to be attached to any tendons.  See picture. Lower extremities: no pretibial edema bilaterally   Skin: Not pale. Not jaundice Neurologic:  alert & oriented X3.  Speech normal, gait appropriate for age and unassisted Psych--  Cognition and judgment appear intact.  Cooperative with normal attention span and concentration.  Behavior appropriate. No anxious or depressed appearing.      Assessment     Assessment  DM (A1C 6.09 September 2020) HTN Hyperlipidemia CV: Dr Clifton James --CAD --Stroke, seen in a MRI Asthma- per allergist  [facility] --DJD knees used to see Dr Thamas Jaegers 2016, local injections --Spinal stenosis (neck-back) - Dr Mikal Plane dx ~ 2008 via MRI neck, back, had local injections 2008; then 04-2015 -- lumbar laminectomy 2019 -- low back R nerve ablation ~ 2022 (helped w/ radiculopathy) GERD BPV, off balance (seen by neuro 2020)  GU: --Elevated PSA -- Dr Annabell Howells --Peyronie's  Disease Skin cancer : [facility] Dr Lenis Dickinson   PLAN: DM: Last  A1c was elevated, recommended to start metformin but has not done that just yet.  Has improved his diet and lost some weight.  We had a long discussion about what his diabetes, how to treat it etc.  Plan: Check A1c, start medications with results (unless A1c has improved significantly). Decreased dexterity, wobbly, poor control of BMs: See LOV, he reported above sxs, saw neurosurgery Dr. Franky Macho, he was recommended to see GI ref   stool incontinence.  He did notice his gait was unsteady.  Patient also complaining of left buttock pain.  Neurosurgery ordered cervical and lumbar MRIs which are pending. Plan  Refer to GI.  See above. Lump, L arm: new, Etiology unclear, he just noticed it yesterday.  For now recommend observation. Right parotid infection?  Was seen by Vernona Rieger few weeks ago, see [name], sxs resolved. Vaccine advice provided. RTC 4 months

## 2022-12-23 ENCOUNTER — Other Ambulatory Visit: Payer: Self-pay | Admitting: Internal Medicine

## 2022-12-24 ENCOUNTER — Other Ambulatory Visit: Payer: Self-pay | Admitting: Internal Medicine

## 2023-01-10 ENCOUNTER — Encounter: Payer: Self-pay | Admitting: Internal Medicine

## 2023-01-11 NOTE — Progress Notes (Unsigned)
No chief complaint on file.  History of Present Illness: 77 yo male with history of HTN, HLD, PVCs and CAD who is here today for follow up. Cardiac cath on 02/06/12 with 95% proximal OM2 lesion and a 60% mid RCA lesion. EF was 65%. His OM2 was very large in caliber and was treated with bare-metal stents x 2. Medical management of RCA stenosis which was not felt to be flow limiting. He has chronic dizziness and is felt to have vertigo. Normal stress myoview December 2015. He has been limited by chronic back pain due to spinal stenosis but following back surgery in July 2019 his pain improved. Plavix was stopped in January 2021 and ASA was started. Echo January 2021 with LVEF=60-65%. No significant valve disease. Possible aortic root dilatation. Follow up chest CTA with no evidence of dilation of the ascending aorta. He was seen in our office June 2021 by Norma Fredrickson, NP and reported dyspnea and chest pain in the setting of an upper respiratory infection. Cardiac cath June 2021 with patent obtuse marginal stents, mild LAD disease and moderate RCA disease which was unchanged and not felt to be flow limiting. Normal LV systolic function. Nuclear stress test in April 2024 with no ischemia.   He is here today for follow up. The patient denies any chest pain, dyspnea, palpitations, lower extremity edema, orthopnea, PND, dizziness, near syncope or syncope.   Primary Care Physician: Wanda Plump, MD  Past Medical History:  Diagnosis Date   Allergy    seasonal   Anal fissure    Arthritis    Asthma, chronic 06/16/2007   Qualifier: Diagnosis of  By: Alwyn Ren MD, Chrissie Noa   Onset:as child Triggers (environmental, infectious, allergic): all, mainly environmental triggers Rescue inhaler [id number] Maintenance medications/ response:Singulair,Qvar Smoking history:never Family history pulmonary disease: no     Basal cell carcinoma of chest wall 2016   1.8 cm, treated with electrodessication and currettage    Benign paroxysmal positional vertigo 11/19/2012   Diagnosed at [facility], [place]  Physical therapy appointment pending    BPH (benign prostatic hyperplasia)    With urinary obstruction   Coronary artery disease    a. 02/2012 Cath/PCI: LM 10, LAD min irregs, LCX large, OM1 sm, 40 ost, OM2 95p (5.0x16 Veriflex & 4.5x12 Veriflex BMS'), RCA 30p, 49m, 30d, PDA/PLA min irregs, EF 65%   Diabetes mellitus without complication (HCC)    Diverticulosis    Erectile dysfunction due to arterial insufficiency    Fundic gland polyps of stomach, benign 2010   GERD (gastroesophageal reflux disease)    History of elevated PSA    History of kidney stones    Hyperlipidemia    Hypertension    Nocturia    Perennial allergic rhinitis    Peyronie's disease    Skin cancer    Basal and squamous cell cancers, greater than 20   Stroke (HCC) 2016   Tubular adenoma of colon 2010    Past Surgical History:  Procedure Laterality Date   COLONOSCOPY  2016   COLONOSCOPY  06/2020   CORONARY ANGIOPLASTY WITH STENT PLACEMENT  02/06/2012   OM2  bare metal    EPIDURAL BLOCK INJECTION  04-2015   Dr Murray Hodgkins   epidural steroids  2007, 2009   X 2 @ cervical &, lumbar)   HERNIA REPAIR     INTRAVASCULAR ULTRASOUND  02/06/2012   Procedure: INTRAVASCULAR ULTRASOUND;  Surgeon: Kathleene Hazel, MD;  Location: [facility] CATH LAB;  Service:  Cardiovascular;;   LEFT HEART CATH AND CORONARY ANGIOGRAPHY N/A 09/30/2019   Procedure: LEFT HEART CATH AND CORONARY ANGIOGRAPHY;  Surgeon: Kathleene Hazel, MD;  Location: [facility] INVASIVE CV LAB;  Service: Cardiovascular;  Laterality: N/A;   LEFT HEART CATHETERIZATION WITH CORONARY ANGIOGRAM N/A 02/06/2012   Procedure: LEFT HEART CATHETERIZATION WITH CORONARY ANGIOGRAM;  Surgeon: Kathleene Hazel, MD;  Location: [facility] CATH LAB;  Service: Cardiovascular;  Laterality: N/A;   LUMBAR LAMINECTOMY/DECOMPRESSION MICRODISCECTOMY N/A 10/21/2017   Procedure: Lumbar Two-Three, Lumbar  Four-Five Discectomy;  Surgeon: Coletta Memos, MD;  Location: [facility] OR;  Service: Neurosurgery;  Laterality: N/A;   PERCUTANEOUS CORONARY STENT INTERVENTION (PCI-S)  02/06/2012   Procedure: PERCUTANEOUS CORONARY STENT INTERVENTION (PCI-S);  Surgeon: Kathleene Hazel, MD;  Location: [facility] CATH LAB;  Service: Cardiovascular;;   septoplasty     TONSILLECTOMY AND ADENOIDECTOMY     TRIGGER FINGER RELEASE     UPPER GASTROINTESTINAL ENDOSCOPY  2011   gastric polyps   VASECTOMY      Current Outpatient Medications  Medication Sig Dispense Refill   albuterol (VENTOLIN HFA) 108 (90 Base) MCG/ACT inhaler Inhale 2 puffs into the lungs every 4 (four) hours as needed for wheezing or shortness of breath. 18 g 1   aspirin EC 81 MG tablet Take 1 tablet (81 mg total) by mouth daily. 90 tablet 3   Azelastine HCl 137 MCG/SPRAY SOLN PLACE 2 SPRAYS INTO BOTH NOSTRILS AT BEDTIME AS NEEDED FOR RHINITIS OR ALLERGIES. 30 mL 5   Benralizumab (FASENRA PEN) 30 MG/ML SOAJ Inject 1 mL (30 mg total) into the skin every 28 (twenty-eight) days. For 2 more doses (1st dose 12/5 /23) then every 8 weeks 1 mL 8   Budeson-Glycopyrrol-Formoterol (BREZTRI AEROSPHERE) 160-9-4.8 MCG/ACT AERO Inhale 2 puffs into the lungs in the morning and at bedtime. 5.9 g 5   cetirizine (ZYRTEC) 10 MG tablet Take 10 mg by mouth daily.     chlorhexidine (HIBICLENS) 4 % external liquid Apply topically daily as needed. 120 mL 0   doxycycline (ADOXA) 100 MG tablet Take 100 mg by mouth 2 (two) times daily.     EPINEPHrine (EPIPEN 2-PAK) 0.3 mg/0.3 mL IJ SOAJ injection Use as directed for severe allergic reactions (Patient not taking: Reported on 12/22/2022) 2 each 3   fluticasone (FLONASE) 50 MCG/ACT nasal spray ONE SPRAY EACH NOSTRIL TWICE A DAY IF NEEDED FOR STUFFY NOSE. 48 mL 1   hydrocortisone 2.5 % cream Apply 1 application topically daily as needed (Eczema).     ketoconazole (NIZORAL) 2 % shampoo Apply 1 application topically once a week.      metFORMIN (GLUCOPHAGE-XR) 500 MG 24 hr tablet Take 2 tablets (1,000 mg total) by mouth daily with breakfast. 180 tablet 1   metoprolol tartrate (LOPRESSOR) 25 MG tablet Take 0.5 tablets (12.5 mg total) by mouth 2 (two) times daily. 90 tablet 1   montelukast (SINGULAIR) 10 MG tablet Take 1 tablet (10 mg total) by mouth at bedtime. 90 tablet 1   Multiple Vitamin (MULTIVITAMIN WITH MINERALS) TABS tablet Take 1 tablet by mouth daily.     mupirocin ointment (BACTROBAN) 2 % Apply 1 Application topically 2 (two) times daily. 22 g 0   nitroGLYCERIN (NITROSTAT) 0.4 MG SL tablet Place 1 tablet (0.4 mg total) under the tongue every 5 (five) minutes as needed for chest pain. (Patient not taking: Reported on 12/22/2022) 25 tablet 3   olmesartan-hydrochlorothiazide (BENICAR HCT) 20-12.5 MG tablet Take 1 tablet by mouth daily. 90 tablet 1  Omega-3 Fatty Acids (FISH OIL) 1200 MG CAPS Take 1,200 mg by mouth daily.     pantoprazole (PROTONIX) 40 MG tablet Take 1 tablet (40 mg total) by mouth 2 (two) times daily. 180 tablet 3   rosuvastatin (CRESTOR) 40 MG tablet Take 1 tablet (40 mg total) by mouth daily. 90 tablet 1   tadalafil (CIALIS) 5 MG tablet Take 5 mg by mouth daily as needed.     tamsulosin (FLOMAX) 0.4 MG CAPS capsule Take 1 capsule (0.4 mg total) by mouth daily. 15 capsule 0   triamcinolone cream (KENALOG) 0.1 % Apply 1 application topically daily as needed (Eczema).      Current Facility-Administered Medications  Medication Dose Route Frequency Provider Last Rate Last Admin   Benralizumab SOSY 30 mg  30 mg Subcutaneous Q8 Heath Lark, MD   30 mg at 12/05/22 1610    Allergies  Allergen Reactions   Nifedipine Dermatitis and Rash    edema    Social History   Socioeconomic History   Marital status: Married    Spouse name: Not on file   Number of children: 3   Years of education: Not on file   Highest education level: Bachelor's degree (e.g., BA, AB, BS)  Occupational History    Occupation: Firefighter   Tobacco Use   Smoking status: Never   Smokeless tobacco: Never  Vaping Use   Vaping status: Never Used  Substance and Sexual Activity   Alcohol use: Not Currently    Comment: Wine occasionally   Drug use: No   Sexual activity: Yes    Partners: Female  Other Topics Concern   Not on file  Social History Narrative   3 daughters, 5 g-children   lifes w/ wife in [place]   Right handed   Two story home   Social Determinants of Health   Financial Resource Strain: Low Risk  (09/11/2022)   Overall Financial Resource Strain (CARDIA)    Difficulty of Paying Living Expenses: Not hard at all  Food Insecurity: No Food Insecurity (09/11/2022)   Hunger Vital Sign    Worried About Running Out of Food in the Last Year: Never true    Ran Out of Food in the Last Year: Never true  Transportation Needs: No Transportation Needs (09/11/2022)   PRAPARE - Administrator, Civil Service (Medical): No    Lack of Transportation (Non-Medical): No  Physical Activity: Insufficiently Active (09/11/2022)   Exercise Vital Sign    Days of Exercise per Week: 3 days    Minutes of Exercise per Session: 40 min  Stress: Stress Concern Present (09/11/2022)   [employer] of Occupational Health - Occupational Stress Questionnaire    Feeling of Stress : To some extent  Social Connections: Unknown (09/11/2022)   Social Connection and Isolation Panel [NHANES]    Frequency of Communication with Friends and Family: More than three times a week    Frequency of Social Gatherings with Friends and Family: Once a week    Attends Religious Services: Patient declined    Database administrator or Organizations: Yes    Attends Engineer, structural: More than 4 times per year    Marital Status: Married  Catering manager Violence: Not on file    Family History  Problem Relation Age of Onset   Heart attack Father 48       died @ 49   Heart disease Mother     Breast cancer Mother  Esophageal cancer Mother        Barrett's   Heart attack Maternal Grandmother        after 47   Dementia Maternal Grandmother        CVA   Breast cancer Maternal Grandmother    Diabetes Maternal Grandmother    Stroke Maternal Grandmother        in 64s   Heart attack Paternal Uncle 37   Breast cancer Maternal Aunt    Colon cancer Neg Hx    Prostate cancer Neg Hx    Colon polyps Neg Hx    Rectal cancer Neg Hx    Stomach cancer Neg Hx     Review of Systems:  As stated in the HPI and otherwise negative.   There were no vitals taken for this visit.  Physical Examination:  General: Well developed, well nourished, NAD  HEENT: OP clear, mucus membranes moist  SKIN: warm, dry. No rashes. Neuro: No focal deficits  Musculoskeletal: Muscle strength 5/5 all ext  Psychiatric: Mood and affect normal  Neck: No JVD, no carotid bruits, no thyromegaly, no lymphadenopathy.  Lungs:Clear bilaterally, no wheezes, rhonci, crackles Cardiovascular: Regular rate and rhythm. No murmurs, gallops or rubs. Abdomen:Soft. Bowel sounds present. Non-tender.  Extremities: No lower extremity edema. Pulses are 2 + in the bilateral DP/PT.  EKG:  EKG is *** ordered today. The ekg ordered today demonstrates   Recent Labs: 01/15/2022: Magnesium 2.0 08/20/2022: TSH 3.00 09/11/2022: ALT 18; BUN 19; Creatinine, Ser 1.35; Hemoglobin 14.0; Platelets 149.0; Potassium 4.1; Sodium 138   Lipid Panel    Component Value Date/Time   CHOL 103 08/20/2022 1337   TRIG 218.0 (H) 08/20/2022 1337   HDL 31.30 (L) 08/20/2022 1337   CHOLHDL 3 08/20/2022 1337   VLDL 43.6 (H) 08/20/2022 1337   LDLCALC 42 04/12/2019 0801   LDLDIRECT 49.0 08/20/2022 1337     Wt Readings from Last 3 Encounters:  12/22/22 99.5 kg  09/11/22 101.2 kg  08/20/22 102.5 kg    Assessment and Plan:   1. CAD without angina: No chest pain. Cardiac cath June 2021 with stable CAD. Nuclear stress April 2024 with no ischemia.  Continue ASA, statin and beta blocker.    2. HTN: BP is well controlled. No changes  3. Hyperlipidemia: LDL 49 in May 2024. Continue statin.    Labs/ tests ordered today include:  No orders of the defined types were placed in this encounter.  Disposition:   F/U with me in 12  months  Signed, Verne Carrow, MD 01/11/2023 6:46 PM    [facility] [address] [place], [place], [place]  [address] Phone: [phone]; Fax: [phone]

## 2023-01-12 ENCOUNTER — Encounter: Payer: Self-pay | Admitting: Cardiovascular Disease

## 2023-01-12 ENCOUNTER — Ambulatory Visit: Payer: PPO | Attending: Cardiovascular Disease | Admitting: Cardiovascular Disease

## 2023-01-12 VITALS — BP 126/70 | HR 56 | Ht 70.0 in | Wt 220.0 lb

## 2023-01-12 DIAGNOSIS — E7800 Pure hypercholesterolemia, unspecified: Secondary | ICD-10-CM | POA: Diagnosis not present

## 2023-01-12 DIAGNOSIS — I10 Essential (primary) hypertension: Secondary | ICD-10-CM | POA: Diagnosis not present

## 2023-01-12 DIAGNOSIS — I2510 Atherosclerotic heart disease of native coronary artery without angina pectoris: Secondary | ICD-10-CM

## 2023-01-12 NOTE — Patient Instructions (Signed)

## 2023-01-13 ENCOUNTER — Encounter: Payer: Self-pay | Admitting: Internal Medicine

## 2023-01-15 ENCOUNTER — Other Ambulatory Visit: Payer: Self-pay | Admitting: Internal Medicine

## 2023-01-28 ENCOUNTER — Ambulatory Visit: Payer: PPO | Admitting: Internal Medicine

## 2023-01-28 ENCOUNTER — Encounter: Payer: Self-pay | Admitting: Internal Medicine

## 2023-01-28 ENCOUNTER — Ambulatory Visit: Payer: PPO

## 2023-01-28 VITALS — BP 118/70 | HR 65 | Temp 97.7°F | Resp 18 | Wt 225.0 lb

## 2023-01-28 DIAGNOSIS — J4550 Severe persistent asthma, uncomplicated: Secondary | ICD-10-CM

## 2023-01-28 DIAGNOSIS — L2084 Intrinsic (allergic) eczema: Secondary | ICD-10-CM | POA: Diagnosis not present

## 2023-01-28 DIAGNOSIS — K219 Gastro-esophageal reflux disease without esophagitis: Secondary | ICD-10-CM | POA: Diagnosis not present

## 2023-01-28 DIAGNOSIS — J3089 Other allergic rhinitis: Secondary | ICD-10-CM | POA: Diagnosis not present

## 2023-01-28 MED ORDER — MONTELUKAST SODIUM 10 MG PO TABS: 10.0000 mg | ORAL_TABLET | Freq: Every day | ORAL | 1 refills | Status: DC

## 2023-01-28 NOTE — Progress Notes (Signed)
Follow Up Note  RE: George Alvarez MRN: [id number] DOB: 02/25/1946 Date of Office Visit: 01/28/2023  Referring provider: Wanda Plump, MD Primary care provider: Wanda Plump, MD  Chief Complaint: Follow-up and Asthma  History of Present Illness: I had the pleasure of seeing George Alvarez for a follow up visit at the [facility] on 01/28/2023. He is a 77 y.o. male, who is being followed for severe persistent asthma on Fasenra, allergic rhinitis. His previous allergy office visit was on 04/28/22 with Dr. Marlynn Perking. Today is a regular follow up visit.  History obtained from patient, chart review.  The patient, with a history of asthma, has been doing well since starting [place], with no reported episodes since initiation of the medication. He reports the best pulmonary function test results he's ever had. However, he expresses concern about the re-enrollment process for the medication through [place] [facility], as he has received letters indicating he needs to contact providers for payment approval and submit documentation. He is unsure of how to proceed with this process.  He has not discussed with Tammy yet.   The patient also reports intermittent issues with eczema, which he manages with the help of Dermatology. The condition seems to be influenced by environmental and stress factors, with flare-ups occurring during changes in location and weather.  In addition to his respiratory and skin conditions, the patient has been recently diagnosed with diabetes based on an elevated A1c level. Despite making dietary changes and seeing a decrease in his A1c level, the diagnosis remains. He reports no symptoms related to diabetes and tests for diabetic complications have been negative. He is also on Protonix for heartburn, which is well controlled.   The patient is also on Breztri for his asthma and has been adhering to the prescribed regimen. He reports no issues with the Fasenra injections  or any side effects. He expresses satisfaction with his current treatment plan and hopes to continue it.  Last allergy injection was September 2023 and he received 0.05 of the gold vial. Ait started 06/26/21: Not intertested in restarting  He  was receiving 1 vial containing tree, weed, mold and dust mite pollen.     Assessment and Plan: George Alvarez is a 77 y.o. male with: Other allergic rhinitis  Severe persistent asthma without complication - Plan: Spirometry with Graph  Gastroesophageal reflux disease without esophagitis  Intrinsic atopic dermatitis Plan: Patient Instructions  Severe persistent asthma  with acute exacebration: slowly improving  Continue prednisone as prescribed - Continue Breztri 2 puffs twice a day with spacer   -wash mouth out of use  -Continue Fasenra 30 mg injected under the skin every 4 weeks for first 3 injections  - Continue Montelukast 10mg  daily  - Use spacer with inhaler, exhale fully before each puff - We will continue with albuterol as needed.     2. Allergic rhinitis (dust mites, pollens, mold)  Skin test positive to Tree, George Alvarez, DM  - Continue avoidance measures for dust mite, molds, trees, weed  - Continue  Flonase 1 spray per nostril twice a day  - Continue Astelin (azelastine) use 1-2 sprays in each nostril up to three times daily as needed  - Continue montelukast 10mg  daily. - Continue  sinus rinses 10-15 minutes prior to nose sprays  -Continue allergy injections per protocol    3.Atopic dermatitis - Continue with moisturizing as needed.  - Follow up with Dermatology and continue current skin care regimen  4. Gastroesophageal reflux disease - Continue with Protonix daily.  Follow up: in 4 months    Thank you so much for letting me partake in your care today.  Don't hesitate to reach out if you have any additional concerns!  Ferol Luz, MD  [facility] [place] [place] No follow-ups on file.  Meds  ordered this encounter  Medications   montelukast (SINGULAIR) 10 MG tablet    Sig: Take 1 tablet (10 mg total) by mouth at bedtime.    Dispense:  90 tablet    Refill:  1    Lab Orders  No laboratory test(s) ordered today   Diagnostics: Spirometry:  Tracings reviewed. His effort: Good reproducible efforts. FVC: 2.94 L FEV1: 2.20 L, 72% predicted FEV1/FVC ratio: 75% Interpretation: Spirometry consistent with possible restrictive disease.  Please see scanned spirometry results for details.    Medication List:  Current Outpatient Medications  Medication Sig Dispense Refill   albuterol (VENTOLIN HFA) 108 (90 Base) MCG/ACT inhaler Inhale 2 puffs into the lungs every 4 (four) hours as needed for wheezing or shortness of breath. 18 g 1   aspirin EC 81 MG tablet Take 1 tablet (81 mg total) by mouth daily. 90 tablet 3   Azelastine HCl 137 MCG/SPRAY SOLN PLACE 2 SPRAYS INTO BOTH NOSTRILS AT BEDTIME AS NEEDED FOR RHINITIS OR ALLERGIES. 30 mL 5   Benralizumab (FASENRA PEN) 30 MG/ML SOAJ Inject 1 mL (30 mg total) into the skin every 28 (twenty-eight) days. For 2 more doses (1st dose 12/5 /23) then every 8 weeks 1 mL 8   Budeson-Glycopyrrol-Formoterol (BREZTRI AEROSPHERE) 160-9-4.8 MCG/ACT AERO Inhale 2 puffs into the lungs in the morning and at bedtime. 5.9 g 5   cetirizine (ZYRTEC) 10 MG tablet Take 10 mg by mouth daily.     chlorhexidine (HIBICLENS) 4 % external liquid Apply topically daily as needed. 120 mL 0   EPINEPHrine (EPIPEN 2-PAK) 0.3 mg/0.3 mL IJ SOAJ injection Use as directed for severe allergic reactions 2 each 3   fluticasone (FLONASE) 50 MCG/ACT nasal spray ONE SPRAY EACH NOSTRIL TWICE A DAY IF NEEDED FOR STUFFY NOSE. 48 mL 1   hydrocortisone 2.5 % cream Apply 1 application topically daily as needed (Eczema).     ketoconazole (NIZORAL) 2 % shampoo Apply 1 application topically once a week.     metFORMIN (GLUCOPHAGE-XR) 500 MG 24 hr tablet Take 2 tablets (1,000 mg total) by mouth  daily with breakfast. (Patient taking differently: Take 500 mg by mouth daily with breakfast.) 180 tablet 1   metoprolol tartrate (LOPRESSOR) 25 MG tablet Take 0.5 tablets (12.5 mg total) by mouth 2 (two) times daily. 90 tablet 1   Multiple Vitamin (MULTIVITAMIN WITH MINERALS) TABS tablet Take 1 tablet by mouth daily.     mupirocin ointment (BACTROBAN) 2 % Apply 1 Application topically 2 (two) times daily. 22 g 0   olmesartan-hydrochlorothiazide (BENICAR HCT) 20-12.5 MG tablet Take 1 tablet by mouth daily. 90 tablet 1   Omega-3 Fatty Acids (FISH OIL) 1200 MG CAPS Take 1,200 mg by mouth daily.     pantoprazole (PROTONIX) 40 MG tablet Take 1 tablet (40 mg total) by mouth 2 (two) times daily. 180 tablet 3   rosuvastatin (CRESTOR) 40 MG tablet Take 1 tablet (40 mg total) by mouth daily. 90 tablet 1   tadalafil (CIALIS) 5 MG tablet Take 5 mg by mouth daily as needed.     tamsulosin (FLOMAX) 0.4 MG CAPS capsule Take 1 capsule (  0.4 mg total) by mouth daily. 15 capsule 0   triamcinolone cream (KENALOG) 0.1 % Apply 1 application topically daily as needed (Eczema).      montelukast (SINGULAIR) 10 MG tablet Take 1 tablet (10 mg total) by mouth at bedtime. 90 tablet 1   Current Facility-Administered Medications  Medication Dose Route Frequency Provider Last Rate Last Admin   Benralizumab SOSY 30 mg  30 mg Subcutaneous Q8 Heath Lark, MD   30 mg at 01/28/23 1038   Allergies: Allergies  Allergen Reactions   Nifedipine Dermatitis and Rash    edema   I reviewed his past medical history, social history, family history, and environmental history and no significant changes have been reported from his previous visit.  ROS: All others negative except as noted per HPI.   Objective: BP 118/70   Pulse 65   Temp 97.7 F (36.5 C) (Temporal)   Resp 18   Wt 225 lb (102.1 kg)   SpO2 97%   BMI 32.28 kg/m  Body mass index is 32.28 kg/m. General Appearance:  Alert, cooperative, no distress, appears  stated age  Head:  Normocephalic, without obvious abnormality, atraumatic  Eyes:  Conjunctiva clear, EOM's intact  Nose: Nares normal, hypertrophic turbinates, normal mucosa, no visible anterior polyps, and septum midline  Throat: Lips, tongue normal; teeth and gums normal, normal posterior oropharynx  Neck: Supple, symmetrical  Lungs:   clear to auscultation bilaterally, Respirations unlabored, no coughing  Heart:  regular rate and rhythm and no murmur, Appears well perfused  Extremities: No edema  Skin: Skin color, texture, turgor normal, no rashes or lesions on visualized portions of skin   Neurologic: No gross deficits   Previous notes and tests were reviewed. The plan was reviewed with the patient/family, and all questions/concerned were addressed.  It was my pleasure to see Daquann today and participate in his care. Please feel free to contact me with any questions or concerns.  Sincerely,  Ferol Luz, MD  Allergy & Immunology  [facility] Office: [phone]

## 2023-01-28 NOTE — Patient Instructions (Signed)
Severe persistent asthma  with acute exacebration: slowly improving  Continue prednisone as prescribed - Continue Breztri 2 puffs twice a day with spacer   -wash mouth out of use  -Continue Fasenra 30 mg injected under the skin every 4 weeks for first 3 injections  - Continue Montelukast 10mg  daily  - Use spacer with inhaler, exhale fully before each puff - We will continue with albuterol as needed.     2. Allergic rhinitis (dust mites, pollens, mold)  Skin test positive to Tree, [employer], DM  - Continue avoidance measures for dust mite, molds, trees, weed  - Continue  Flonase 1 spray per nostril twice a day  - Continue Astelin (azelastine) use 1-2 sprays in each nostril up to three times daily as needed  - Continue montelukast 10mg  daily. - Continue  sinus rinses 10-15 minutes prior to nose sprays  -Continue allergy injections per protocol    3.Atopic dermatitis - Continue with moisturizing as needed.  - Follow up with Dermatology and continue current skin care regimen    4. Gastroesophageal reflux disease - Continue with Protonix daily.  Follow up: in 4 months    Thank you so much for letting me partake in your care today.  Don't hesitate to reach out if you have any additional concerns!  Ferol Luz, MD  [facility] [place] [place]

## 2023-02-05 ENCOUNTER — Telehealth: Payer: Self-pay | Admitting: *Deleted

## 2023-02-05 NOTE — Telephone Encounter (Signed)
-----   Message from George Alvarez sent at 01/28/2023  1:18 PM EDT ----- George Alvarez got a letter from [facility] and me requesting him to re-enroll.  He doesn't remember doing this in the past for fasenra.  He was hopin you could reach out and give him some advuce.

## 2023-02-05 NOTE — Telephone Encounter (Signed)
T/c to patient to advised to go ahead with link to see if he will qualify for Fasenra PAP for next year this is new this year

## 2023-02-10 DIAGNOSIS — M47816 Spondylosis without myelopathy or radiculopathy, lumbar region: Secondary | ICD-10-CM | POA: Diagnosis not present

## 2023-02-10 DIAGNOSIS — M48062 Spinal stenosis, lumbar region with neurogenic claudication: Secondary | ICD-10-CM | POA: Diagnosis not present

## 2023-02-17 ENCOUNTER — Other Ambulatory Visit: Payer: Self-pay | Admitting: *Deleted

## 2023-02-17 MED ORDER — FASENRA PEN 30 MG/ML ~~LOC~~ SOAJ: 30.0000 mg | SUBCUTANEOUS | 8 refills | Status: DC

## 2023-02-22 ENCOUNTER — Other Ambulatory Visit: Payer: Self-pay | Admitting: Internal Medicine

## 2023-02-23 ENCOUNTER — Telehealth: Payer: Self-pay | Admitting: Internal Medicine

## 2023-02-23 MED ORDER — ALBUTEROL SULFATE HFA 108 (90 BASE) MCG/ACT IN AERS: 2.0000 | INHALATION_SPRAY | RESPIRATORY_TRACT | 1 refills | Status: AC | PRN

## 2023-02-23 NOTE — Telephone Encounter (Signed)
Sent in albuterol to [facility] on [address]

## 2023-02-23 NOTE — Telephone Encounter (Signed)
Patient called about an inhaler and he needed a refill and he didn't know the name of it and requested a call back

## 2023-02-24 MED ORDER — FASENRA PEN 30 MG/ML ~~LOC~~ SOAJ: 30.0000 mg | SUBCUTANEOUS | 6 refills | Status: DC

## 2023-02-24 NOTE — Addendum Note (Signed)
Addended by: Devoria Glassing on: 02/24/2023 09:00 AM   Modules accepted: Orders

## 2023-02-26 ENCOUNTER — Other Ambulatory Visit: Payer: Self-pay | Admitting: Internal Medicine

## 2023-03-15 ENCOUNTER — Other Ambulatory Visit: Payer: Self-pay | Admitting: Internal Medicine

## 2023-03-18 DIAGNOSIS — D3132 Benign neoplasm of left choroid: Secondary | ICD-10-CM | POA: Diagnosis not present

## 2023-03-18 DIAGNOSIS — H02889 Meibomian gland dysfunction of unspecified eye, unspecified eyelid: Secondary | ICD-10-CM | POA: Diagnosis not present

## 2023-03-18 DIAGNOSIS — H524 Presbyopia: Secondary | ICD-10-CM | POA: Diagnosis not present

## 2023-03-18 DIAGNOSIS — H00022 Hordeolum internum right lower eyelid: Secondary | ICD-10-CM | POA: Diagnosis not present

## 2023-03-18 DIAGNOSIS — H5213 Myopia, bilateral: Secondary | ICD-10-CM | POA: Diagnosis not present

## 2023-03-18 DIAGNOSIS — H25811 Combined forms of age-related cataract, right eye: Secondary | ICD-10-CM | POA: Diagnosis not present

## 2023-03-18 DIAGNOSIS — E119 Type 2 diabetes mellitus without complications: Secondary | ICD-10-CM | POA: Diagnosis not present

## 2023-03-25 ENCOUNTER — Ambulatory Visit: Payer: PPO

## 2023-03-25 DIAGNOSIS — J4550 Severe persistent asthma, uncomplicated: Secondary | ICD-10-CM | POA: Diagnosis not present

## 2023-03-27 DIAGNOSIS — R972 Elevated prostate specific antigen [PSA]: Secondary | ICD-10-CM | POA: Diagnosis not present

## 2023-04-03 DIAGNOSIS — N3941 Urge incontinence: Secondary | ICD-10-CM | POA: Diagnosis not present

## 2023-04-03 DIAGNOSIS — N5201 Erectile dysfunction due to arterial insufficiency: Secondary | ICD-10-CM | POA: Diagnosis not present

## 2023-04-03 DIAGNOSIS — N401 Enlarged prostate with lower urinary tract symptoms: Secondary | ICD-10-CM | POA: Diagnosis not present

## 2023-04-03 DIAGNOSIS — R972 Elevated prostate specific antigen [PSA]: Secondary | ICD-10-CM | POA: Diagnosis not present

## 2023-04-03 DIAGNOSIS — R3912 Poor urinary stream: Secondary | ICD-10-CM | POA: Diagnosis not present

## 2023-04-09 ENCOUNTER — Other Ambulatory Visit: Payer: Self-pay | Admitting: *Deleted

## 2023-04-09 MED ORDER — FASENRA PEN 30 MG/ML ~~LOC~~ SOAJ: 30.0000 mg | SUBCUTANEOUS | 6 refills | Status: DC

## 2023-04-21 ENCOUNTER — Other Ambulatory Visit: Payer: Self-pay

## 2023-04-21 MED ORDER — BREZTRI AEROSPHERE 160-9-4.8 MCG/ACT IN AERO: 2.0000 | INHALATION_SPRAY | Freq: Two times a day (BID) | RESPIRATORY_TRACT | 0 refills | Status: DC

## 2023-04-24 ENCOUNTER — Ambulatory Visit (INDEPENDENT_AMBULATORY_CARE_PROVIDER_SITE_OTHER): Payer: PPO | Admitting: Internal Medicine

## 2023-04-24 VITALS — BP 124/62 | HR 65 | Temp 97.7°F | Resp 18 | Ht 70.0 in | Wt 218.1 lb

## 2023-04-24 DIAGNOSIS — Z7984 Long term (current) use of oral hypoglycemic drugs: Secondary | ICD-10-CM | POA: Diagnosis not present

## 2023-04-24 DIAGNOSIS — I10 Essential (primary) hypertension: Secondary | ICD-10-CM

## 2023-04-24 DIAGNOSIS — E785 Hyperlipidemia, unspecified: Secondary | ICD-10-CM | POA: Diagnosis not present

## 2023-04-24 DIAGNOSIS — R2232 Localized swelling, mass and lump, left upper limb: Secondary | ICD-10-CM | POA: Diagnosis not present

## 2023-04-24 DIAGNOSIS — E119 Type 2 diabetes mellitus without complications: Secondary | ICD-10-CM

## 2023-04-24 LAB — LIPID PANEL
Cholesterol: 115 mg/dL (ref 0–200)
HDL: 37.5 mg/dL — ABNORMAL LOW (ref >39.00)
LDL Cholesterol: 44 mg/dL (ref 0–99)
NonHDL: 77.06
Total CHOL/HDL Ratio: 3
Triglycerides: 166 mg/dL — ABNORMAL HIGH (ref 0.0–149.0)
VLDL: 33.2 mg/dL (ref 0.0–40.0)

## 2023-04-24 LAB — BASIC METABOLIC PANEL
BUN: 24 mg/dL — ABNORMAL HIGH (ref 6–23)
CO2: 28 meq/L (ref 19–32)
Calcium: 9.5 mg/dL (ref 8.4–10.5)
Chloride: 104 meq/L (ref 96–112)
Creatinine, Ser: 1.11 mg/dL (ref 0.40–1.50)
GFR: 65.12 mL/min (ref >60.00)
Glucose, Bld: 89 mg/dL (ref 70–99)
Potassium: 4.3 meq/L (ref 3.5–5.1)
Sodium: 140 meq/L (ref 135–145)

## 2023-04-24 LAB — MICROALBUMIN / CREATININE URINE RATIO
Creatinine,U: 121.1 mg/dL
Microalb Creat Ratio: 0.6 mg/g (ref 0.0–30.0)
Microalb, Ur: 0.7 mg/dL (ref 0.0–1.9)

## 2023-04-24 LAB — HEMOGLOBIN A1C: Hgb A1c MFr Bld: 6.6 % — ABNORMAL HIGH (ref 4.6–6.5)

## 2023-04-24 MED ORDER — ONETOUCH ULTRASOFT LANCETS MISC: 12 refills | Status: AC

## 2023-04-24 MED ORDER — ONETOUCH VERIO W/DEVICE KIT: PACK | 0 refills | Status: AC

## 2023-04-24 MED ORDER — GLUCOSE BLOOD VI STRP: ORAL_STRIP | 12 refills | Status: AC

## 2023-04-24 NOTE — Progress Notes (Signed)
Subjective:    Patient ID: George Alvarez, male    DOB: 12-30-1945, 78 y.o.   MRN: [id number]  DOS:  04/24/2023 Type of visit - description: Follow-up  Since the last office visit is doing well. Saw cardiology, note reviewed. Ambulatory BPs are WNL. Has a lump at the left arm, unchanged.   Review of Systems See above   Past Medical History:  Diagnosis Date   Allergy    seasonal   Anal fissure    Arthritis    Asthma, chronic 06/16/2007   Qualifier: Diagnosis of  By: Alwyn Ren MD, Chrissie Noa   Onset:as child Triggers (environmental, infectious, allergic): all, mainly environmental triggers Rescue inhaler NWG:NFAOZH Maintenance medications/ response:Singulair,Qvar Smoking history:never Family history pulmonary disease: no     Basal cell carcinoma of chest wall 2016   1.8 cm, treated with electrodessication and currettage   Benign paroxysmal positional vertigo 11/19/2012   Diagnosed at [facility], [place]  Physical therapy appointment pending    BPH (benign prostatic hyperplasia)    With urinary obstruction   Coronary artery disease    a. 02/2012 Cath/PCI: LM 10, LAD min irregs, LCX large, OM1 sm, 40 ost, OM2 95p (5.0x16 Veriflex & 4.5x12 Veriflex BMS'), RCA 30p, 12m, 30d, PDA/PLA min irregs, EF 65%   Diabetes mellitus without complication (HCC)    Diverticulosis    Erectile dysfunction due to arterial insufficiency    Fundic gland polyps of stomach, benign 2010   GERD (gastroesophageal reflux disease)    History of elevated PSA    History of kidney stones    Hyperlipidemia    Hypertension    Nocturia    Perennial allergic rhinitis    Peyronie's disease    Skin cancer    Basal and squamous cell cancers, greater than 20   Stroke (HCC) 2016   Tubular adenoma of colon 2010    Past Surgical History:  Procedure Laterality Date   COLONOSCOPY  2016   COLONOSCOPY  06/2020   CORONARY ANGIOPLASTY WITH STENT PLACEMENT  02/06/2012   OM2  bare metal    EPIDURAL BLOCK  INJECTION  04-2015   Dr Murray Hodgkins   epidural steroids  2007, 2009   X 2 @ cervical &, lumbar)   HERNIA REPAIR     INTRAVASCULAR ULTRASOUND  02/06/2012   Procedure: INTRAVASCULAR ULTRASOUND;  Surgeon: Kathleene Hazel, MD;  Location: [facility] CATH LAB;  Service: Cardiovascular;;   LEFT HEART CATH AND CORONARY ANGIOGRAPHY N/A 09/30/2019   Procedure: LEFT HEART CATH AND CORONARY ANGIOGRAPHY;  Surgeon: Kathleene Hazel, MD;  Location: [facility] INVASIVE CV LAB;  Service: Cardiovascular;  Laterality: N/A;   LEFT HEART CATHETERIZATION WITH CORONARY ANGIOGRAM N/A 02/06/2012   Procedure: LEFT HEART CATHETERIZATION WITH CORONARY ANGIOGRAM;  Surgeon: Kathleene Hazel, MD;  Location: [facility] CATH LAB;  Service: Cardiovascular;  Laterality: N/A;   LUMBAR LAMINECTOMY/DECOMPRESSION MICRODISCECTOMY N/A 10/21/2017   Procedure: Lumbar Two-Three, Lumbar Four-Five Discectomy;  Surgeon: Coletta Memos, MD;  Location: [facility] OR;  Service: Neurosurgery;  Laterality: N/A;   PERCUTANEOUS CORONARY STENT INTERVENTION (PCI-S)  02/06/2012   Procedure: PERCUTANEOUS CORONARY STENT INTERVENTION (PCI-S);  Surgeon: Kathleene Hazel, MD;  Location: [facility] CATH LAB;  Service: Cardiovascular;;   septoplasty     TONSILLECTOMY AND ADENOIDECTOMY     TRIGGER FINGER RELEASE     UPPER GASTROINTESTINAL ENDOSCOPY  2011   gastric polyps   VASECTOMY      Current Outpatient Medications  Medication Instructions   albuterol (VENTOLIN HFA) 108 (  90 Base) MCG/ACT inhaler 2 puffs, Inhalation, Every 4 hours PRN   aspirin EC 81 mg, Oral, Daily   Azelastine HCl 137 MCG/SPRAY SOLN PLACE 2 SPRAYS INTO BOTH NOSTRILS AT BEDTIME AS NEEDED FOR RHINITIS OR ALLERGIES.   Blood Glucose Monitoring Suppl (ONETOUCH VERIO) w/Device KIT Check blood sugars once daily   Budeson-Glycopyrrol-Formoterol (BREZTRI AEROSPHERE) 160-9-4.8 MCG/ACT AERO 2 puffs, Inhalation, 2 times daily   cetirizine (ZYRTEC) 10 mg, Daily   chlorhexidine (HIBICLENS) 4 % external liquid Topical,  Daily PRN   EPINEPHrine (EPIPEN 2-PAK) 0.3 mg/0.3 mL IJ SOAJ injection Use as directed for severe allergic reactions   Fasenra Pen 30 mg, Subcutaneous, Every 8 weeks   Fish Oil 1,200 mg, Daily   fluticasone (FLONASE) 50 MCG/ACT nasal spray ONE SPRAY EACH NOSTRIL TWICE A DAY IF NEEDED FOR STUFFY NOSE.   glucose blood test strip Check blood sugars once daily   hydrocortisone 2.5 % cream 1 application , Daily PRN   ketoconazole (NIZORAL) 2 % shampoo 1 application , Weekly   Lancets (ONETOUCH ULTRASOFT) lancets Check blood sugars once daily   metFORMIN (GLUCOPHAGE-XR) 500 mg, Oral, Daily with breakfast   metoprolol tartrate (LOPRESSOR) 12.5 mg, Oral, 2 times daily   montelukast (SINGULAIR) 10 mg, Oral, Daily at bedtime   Multiple Vitamin (MULTIVITAMIN WITH MINERALS) TABS tablet 1 tablet, Daily   mupirocin ointment (BACTROBAN) 2 % 1 Application, Topical, 2 times daily   olmesartan-hydrochlorothiazide (BENICAR HCT) 20-12.5 MG tablet 1 tablet, Oral, Daily   pantoprazole (PROTONIX) 40 mg, Oral, 2 times daily   rosuvastatin (CRESTOR) 40 mg, Oral, Daily   tadalafil (CIALIS) 5 mg, Daily PRN   tamsulosin (FLOMAX) 0.4 mg, Oral, Daily   triamcinolone cream (KENALOG) 0.1 % 1 application , Daily PRN       Objective:   Physical Exam BP 124/62   Pulse 65   Temp 97.7 F (36.5 C) (Oral)   Resp 18   Ht 5\' 10"  (1.778 m)   Wt 218 lb 2 oz (98.9 kg)   SpO2 98%   BMI 31.30 kg/m  General:   Well developed, NAD, BMI noted. HEENT:  Normocephalic . Face symmetric, atraumatic Lungs:  CTA B Normal respiratory effort, no intercostal retractions, no accessory muscle use. Heart: RRR,  no murmur.  Lower extremities: no pretibial edema bilaterally L arm: Lump is unchanged from previous visit.  Not tender, not red, not firm Skin: Not pale. Not jaundice Neurologic:  alert & oriented X3.  Speech normal, gait appropriate for age and unassisted Psych--  Cognition and judgment appear intact.  Cooperative  with normal attention span and concentration.  Behavior appropriate. No anxious or depressed appearing.      Assessment    Problem List DM (A1C 6.09 September 2020) HTN Hyperlipidemia CV: Dr Clifton James --CAD --Stroke, seen in a MRI Asthma- per allergist  [facility] --DJD knees used to see Dr Thamas Jaegers 2016, local injections --Spinal stenosis (neck-back) - Dr Mikal Plane dx ~ 2008 via MRI neck, back, had local injections 2008; then 04-2015 -- lumbar laminectomy 2019 -- low back R nerve ablation ~ 2022 (helped w/ radiculopathy) GERD BPV, off balance (seen by neuro 2020)  GU: --Elevated PSA -- Dr Annabell Howells --Peyronie's  Disease Skin cancer : [facility] Dr Lenis Dickinson    PLAN: DM: on metformin once daily, no ambulatory CBGs, diet and exercise have improved.  Plan: Check A1c,  likes to start checking CBGs: Glucometer prescription sent, goals provided. HTN: On metoprolol, Benicar HCT, check BMP High cholesterol: Good compliance with  Crestor, check FLP. CAD: No symptoms, cardiology OV 01/12/2023, no changes made.   Lamp, L arm: Unchanged, continue observation Decreased dexterity, wobbly, poor control of BMs: Had MRIs, saw neurosurgery, Dr. Franky Macho February 10, 2023, notes not available, rx conservative treatment  To see GI soon, BM control slt better  Elevated PSA: Saw [facility] 04/03/2023  Rhinophyma:  At the end of the visit he pointed to his nose, he has mild bilateral rhinophyma, plans to discuss with dermatology Preventive care: Had a flu and COVID-vaccine. RTC 4 months

## 2023-04-24 NOTE — Assessment & Plan Note (Signed)
DM: on metformin once daily, no ambulatory CBGs, diet and exercise have improved.  Plan: Check A1c,  likes to start checking CBGs: Glucometer prescription sent, goals provided. HTN: On metoprolol, Benicar HCT, check BMP High cholesterol: Good compliance with Crestor, check FLP. CAD: No symptoms, cardiology OV 01/12/2023, no changes made.   Lamp, L arm: Unchanged, continue observation Decreased dexterity, wobbly, poor control of BMs: Had MRIs, saw neurosurgery, Dr. Franky Macho February 10, 2023, notes not available, rx conservative treatment  To see GI soon, BM control slt better  Elevated PSA: Saw [facility] 04/03/2023  Rhinophyma:  At the end of the visit he pointed to his nose, he has mild bilateral rhinophyma, plans to discuss with dermatology Preventive care: Had a flu and COVID-vaccine. RTC 4 months

## 2023-04-24 NOTE — Patient Instructions (Addendum)
  Check the  blood pressure regularly Blood pressure goal:  between 110/65 and  135/85. If it is consistently higher or lower, let me know   Diabetes: You can check your sugars at different times  - early in AM fasting  ( blood sugar goal 70-130) - 2 hours after a meal (blood sugar goal less than 180)     GO TO THE LAB : Get the blood work     Next visit with me  4 months , physical exam   Please schedule it at the front desk

## 2023-04-26 ENCOUNTER — Encounter: Payer: Self-pay | Admitting: Internal Medicine

## 2023-05-19 ENCOUNTER — Other Ambulatory Visit: Payer: Self-pay | Admitting: Internal Medicine

## 2023-05-19 MED ORDER — FLUTICASONE PROPIONATE 50 MCG/ACT NA SUSP: 2.0000 | Freq: Every day | NASAL | 5 refills | Status: DC

## 2023-05-20 ENCOUNTER — Ambulatory Visit: Payer: PPO

## 2023-05-20 DIAGNOSIS — D0471 Carcinoma in situ of skin of right lower limb, including hip: Secondary | ICD-10-CM | POA: Diagnosis not present

## 2023-05-20 DIAGNOSIS — L2089 Other atopic dermatitis: Secondary | ICD-10-CM | POA: Diagnosis not present

## 2023-05-20 DIAGNOSIS — C44511 Basal cell carcinoma of skin of breast: Secondary | ICD-10-CM | POA: Diagnosis not present

## 2023-05-20 DIAGNOSIS — D485 Neoplasm of uncertain behavior of skin: Secondary | ICD-10-CM | POA: Diagnosis not present

## 2023-05-20 DIAGNOSIS — L570 Actinic keratosis: Secondary | ICD-10-CM | POA: Diagnosis not present

## 2023-05-20 DIAGNOSIS — L218 Other seborrheic dermatitis: Secondary | ICD-10-CM | POA: Diagnosis not present

## 2023-05-20 DIAGNOSIS — B078 Other viral warts: Secondary | ICD-10-CM | POA: Diagnosis not present

## 2023-05-20 DIAGNOSIS — J4550 Severe persistent asthma, uncomplicated: Secondary | ICD-10-CM

## 2023-05-20 DIAGNOSIS — L711 Rhinophyma: Secondary | ICD-10-CM | POA: Diagnosis not present

## 2023-06-02 NOTE — Progress Notes (Unsigned)
 [facility] Initial Consultation   Referring Provider Wanda Plump, MD [address] [place],  [place] [address]  Primary Care Provider Wanda Plump, MD  Patient Profile: George Alvarez is a 78 y.o. male who is seen in consultation in the [facility] at the request of Dr. Drue Novel for evaluation and management of the problem(s) noted below.  Problem List: Fecal urgency/incontinence GERD Family history of Barrett's esophagus and esophageal cancer History of adenomatous colon polyps   History of Present Illness   George Alvarez is a 78 y.o. male with a history of HTN, CAD, asthma, T2DM, HLD, spinal stenosis status post surgery who is referred to the gastroenterology office for evaluation and management of fecal urgency/incontinence, GERD and history of adenomatous colon polyps.  Fecal urgency/incontinence -- Reports morning urgency to have bowel movement -- Experienced episodes of incontinence when walking around -- Movements tend to be formed -denies diarrhea -- No anorectal pain or discomfort -- No melena or hematochezia -- Describes a previous history of urinary incontinence he associates with his prostate -- Underwent spinal surgery for spinal stenosis in 2019 but denies incontinence related to this -- No pelvic surgeries  GERD -- Reports longstanding history of GERD for many years -- Currently maintained on Protonix 40 mg p.o. twice daily which controls symptoms of reflux -- Dysphagia or odynophagia -- Last EGD was performed in 2010 -irregular Z-line and gastric polyps -- Reports a family history of Barrett's esophagus and his daughter and mother -- Mother also had esophageal cancer  History of adenomatous polyps -- Last colonoscopy 2022 -7 polyps removed with pathology showing tubular adenomas -this year for colonoscopy -- No family history of colorectal cancer polyps  Last colonoscopy:  05/2020 - 7 polyps (TA). Left colon diverticula,  IH Last endoscopy: 2010 - irregular Z-line and gastric polyps  Last Abd CT/CTE/MRE: None recent  GI Review of Symptoms Significant for fecal incontinence, urgency. Otherwise negative.  General Review of Systems  Review of systems is significant for the pertinent positives and negatives as listed per the HPI.  Full ROS is otherwise negative.  Past Medical History   Past Medical History:  Diagnosis Date   Allergy    seasonal   Anal fissure    Arthritis    Asthma, chronic 06/16/2007   Qualifier: Diagnosis of  By: Alwyn Ren MD, Chrissie Noa   Onset:as child Triggers (environmental, infectious, allergic): all, mainly environmental triggers Rescue inhaler WNU:UVOZDG Maintenance medications/ response:Singulair,Qvar Smoking history:never Family history pulmonary disease: no     Basal cell carcinoma of chest wall 2016   1.8 cm, treated with electrodessication and currettage   Benign paroxysmal positional vertigo 11/19/2012   Diagnosed at [facility], [place]  Physical therapy appointment pending    BPH (benign prostatic hyperplasia)    With urinary obstruction   Coronary artery disease    a. 02/2012 Cath/PCI: LM 10, LAD min irregs, LCX large, OM1 sm, 40 ost, OM2 95p (5.0x16 Veriflex & 4.5x12 Veriflex BMS'), RCA 30p, 48m, 30d, PDA/PLA min irregs, EF 65%   Diabetes mellitus without complication (HCC)    Diverticulosis    Erectile dysfunction due to arterial insufficiency    Fundic gland polyps of stomach, benign 2010   GERD (gastroesophageal reflux disease)    History of elevated PSA    History of kidney stones    Hyperlipidemia    Hypertension    Nocturia    Perennial allergic rhinitis    Peyronie's disease  Skin cancer    Basal and squamous cell cancers, greater than 20   Stroke [facility]) 2016   Tubular adenoma of colon 2010     Past Surgical History   Past Surgical History:  Procedure Laterality Date   COLONOSCOPY  2016   COLONOSCOPY  06/2020   CORONARY ANGIOPLASTY WITH  STENT PLACEMENT  02/06/2012   OM2  bare metal    EPIDURAL BLOCK INJECTION  04-2015   Dr Murray Hodgkins   epidural steroids  2007, 2009   X 2 @ cervical &, lumbar)   HERNIA REPAIR     INTRAVASCULAR ULTRASOUND  02/06/2012   Procedure: INTRAVASCULAR ULTRASOUND;  Surgeon: Kathleene Hazel, MD;  Location: [facility] CATH LAB;  Service: Cardiovascular;;   LEFT HEART CATH AND CORONARY ANGIOGRAPHY N/A 09/30/2019   Procedure: LEFT HEART CATH AND CORONARY ANGIOGRAPHY;  Surgeon: Kathleene Hazel, MD;  Location: [facility] INVASIVE CV LAB;  Service: Cardiovascular;  Laterality: N/A;   LEFT HEART CATHETERIZATION WITH CORONARY ANGIOGRAM N/A 02/06/2012   Procedure: LEFT HEART CATHETERIZATION WITH CORONARY ANGIOGRAM;  Surgeon: Kathleene Hazel, MD;  Location: [facility] CATH LAB;  Service: Cardiovascular;  Laterality: N/A;   LUMBAR LAMINECTOMY/DECOMPRESSION MICRODISCECTOMY N/A 10/21/2017   Procedure: Lumbar Two-Three, Lumbar Four-Five Discectomy;  Surgeon: Coletta Memos, MD;  Location: [facility] OR;  Service: Neurosurgery;  Laterality: N/A;   PERCUTANEOUS CORONARY STENT INTERVENTION (PCI-S)  02/06/2012   Procedure: PERCUTANEOUS CORONARY STENT INTERVENTION (PCI-S);  Surgeon: Kathleene Hazel, MD;  Location: [facility] CATH LAB;  Service: Cardiovascular;;   septoplasty     TONSILLECTOMY AND ADENOIDECTOMY     TRIGGER FINGER RELEASE     UPPER GASTROINTESTINAL ENDOSCOPY  2011   gastric polyps   VASECTOMY       Allergies and Medications   Allergies  Allergen Reactions   Nifedipine Dermatitis and Rash    edema   Current Meds  Medication Sig   albuterol (VENTOLIN HFA) 108 (90 Base) MCG/ACT inhaler Inhale 2 puffs into the lungs every 4 (four) hours as needed for wheezing or shortness of breath.   aspirin EC 81 MG tablet Take 1 tablet (81 mg total) by mouth daily.   azelastine (ASTELIN) 0.1 % nasal spray Place 2 sprays into both nostrils 2 (two) times daily. Use in each nostril as directed   benralizumab (FASENRA PEN) 30 MG/ML  prefilled autoinjector Inject 1 mL (30 mg total) into the skin every 8 (eight) weeks.   Blood Glucose Monitoring Suppl (ONETOUCH VERIO) w/Device KIT Check blood sugars once daily   Budeson-Glycopyrrol-Formoterol (BREZTRI AEROSPHERE) 160-9-4.8 MCG/ACT AERO Inhale 2 puffs into the lungs in the morning and at bedtime.   cetirizine (ZYRTEC) 10 MG tablet Take 10 mg by mouth daily.   chlorhexidine (HIBICLENS) 4 % external liquid Apply topically daily as needed.   fluticasone (FLONASE) 50 MCG/ACT nasal spray ONE SPRAY EACH NOSTRIL TWICE A DAY IF NEEDED FOR STUFFY NOSE.   fluticasone (FLONASE) 50 MCG/ACT nasal spray Place 2 sprays into both nostrils daily.   glucose blood test strip Check blood sugars once daily   hydrocortisone 2.5 % cream Apply 1 application topically daily as needed (Eczema).   ketoconazole (NIZORAL) 2 % shampoo Apply 1 application topically once a week.   Lancets (ONETOUCH ULTRASOFT) lancets Check blood sugars once daily   metFORMIN (GLUCOPHAGE-XR) 500 MG 24 hr tablet Take 1 tablet (500 mg total) by mouth daily with breakfast.   metoprolol tartrate (LOPRESSOR) 25 MG tablet Take 0.5 tablets (12.5 mg total) by mouth 2 (two) times  daily.   montelukast (SINGULAIR) 10 MG tablet Take 1 tablet (10 mg total) by mouth at bedtime.   Multiple Vitamin (MULTIVITAMIN WITH MINERALS) TABS tablet Take 1 tablet by mouth daily.   mupirocin ointment (BACTROBAN) 2 % Apply 1 Application topically 2 (two) times daily.   olmesartan-hydrochlorothiazide (BENICAR HCT) 20-12.5 MG tablet Take 1 tablet by mouth daily.   Omega-3 Fatty Acids (FISH OIL) 1200 MG CAPS Take 1,200 mg by mouth daily.   pantoprazole (PROTONIX) 40 MG tablet TAKE 1 TABLET BY MOUTH TWICE A DAY   [EXPIRED] PEG 3350-KCl-NaCl-NaSulf-MgSul (SUFLAVE) 178.7 g SOLR Take 1 kit by mouth once for 1 dose.   rosuvastatin (CRESTOR) 40 MG tablet Take 1 tablet (40 mg total) by mouth daily.   tadalafil (CIALIS) 5 MG tablet Take 5 mg by mouth daily as  needed.   tamsulosin (FLOMAX) 0.4 MG CAPS capsule Take 1 capsule (0.4 mg total) by mouth daily.   Current Facility-Administered Medications for the 06/03/23 encounter (Office Visit) with Ottie Glazier, MD  Medication   Benralizumab SOSY 30 mg     Family History   Family History  Problem Relation Age of Onset   Heart disease Mother    Breast cancer Mother    Esophageal cancer Mother        Barrett's   Heart attack Father 35       died @ 48   Heart attack Maternal Grandmother        after 9   Dementia Maternal Grandmother        CVA   Breast cancer Maternal Grandmother    Diabetes Maternal Grandmother    Stroke Maternal Grandmother        in 46s   Breast cancer Maternal Aunt    Heart attack Paternal Uncle 38   Colon cancer Neg Hx    Liver disease Neg Hx      Social History   Social History   Tobacco Use   Smoking status: Never   Smokeless tobacco: Never  Vaping Use   Vaping status: Never Used  Substance Use Topics   Alcohol use: Not Currently    Comment: Wine occasionally   Drug use: No   Jorden reports that he has never smoked. He has never used smokeless tobacco. He reports that he does not currently use alcohol. He reports that he does not use drugs.  Vital Signs and Physical Examination   Vitals:   06/03/23 1412  BP: 120/62  Pulse: 67   Body mass index is 31.42 kg/m. Weight: 219 lb (99.3 kg)  General: Well developed, well nourished, no acute distress Head: Normocephalic and atraumatic Mouth: No deformities or lesions noted Lungs: Clear throughout to auscultation Heart: Regular rate and rhythm; No murmurs, rubs or bruits Abdomen: Soft, non tender and non distended. No masses, hepatosplenomegaly or hernias noted. Normal Bowel sounds Rectal: Deferred Musculoskeletal: Symmetrical with no gross deformities    Review of Data  The following data was reviewed at the time of this encounter:  Laboratory Studies      Latest Ref Rng & Units  09/11/2022    3:25 PM 08/20/2022    1:37 PM 04/03/2021   10:34 AM  CBC  WBC 4.0 - 10.5 K/uL 7.0  6.4  5.9   Hemoglobin 13.0 - 17.0 g/dL 16.1  09.6  04.5   Hematocrit 39.0 - 52.0 % 42.2  43.1  42.5   Platelets 150.0 - 400.0 K/uL 149.0  153.0  131.0  No results found for: "LIPASE"    Latest Ref Rng & Units 04/24/2023   10:52 AM 09/11/2022    3:25 PM 08/20/2022    1:37 PM  CMP  Glucose 70 - 99 mg/dL 89  161  096   BUN 6 - 23 mg/dL 24  19  16    Creatinine 0.40 - 1.50 mg/dL 0.45  4.09  8.11   Sodium 135 - 145 mEq/L 140  138  138   Potassium 3.5 - 5.1 mEq/L 4.3  4.1  4.2   Chloride 96 - 112 mEq/L 104  104  102   CO2 19 - 32 mEq/L 28  25  26    Calcium 8.4 - 10.5 mg/dL 9.5  9.5  9.4   Total Protein 6.0 - 8.3 g/dL  6.6  6.7   Total Bilirubin 0.2 - 1.2 mg/dL  0.7  0.7   Alkaline Phos 39 - 117 U/L  47  59   AST 0 - 37 U/L  23  34   ALT 0 - 53 U/L  18  29      Imaging Studies  None recent  GI Procedures and Studies  Colonoscopy 05/2020  7 polyps (TA). Left colon diverticula, IH  Colonoscopy 08/2014 2 sessile polyps in DC, AC (TA),  left colon diverticulosis, IH  EGD/Colonoscopy 01/2009 Irregular Z-line and gastric polyps 4 polyps, left colon diverticulosis, IH Path: Mildly inflamed GE junction mucosa consistent with reflux, fundic gland polyps; colon polyps TA and HP   Clinical Impression  It is my clinical impression that George Alvarez is a 78 y.o. male with;  Fecal urgency/incontinence GERD Family history of Barrett's esophagus and esophageal cancer History of adenomatous colon polyps  George Alvarez is referred to the office with concern for recent onset fecal urgency as well as incontinence.  States that his bowel movements are overall formed and denies diarrhea.  He has a pertinent past medical history of spinal stenosis for which he underwent surgery in 2019.  He has had intermittent urinary incontinence that he associates with his prostate but denies any issues of fecal  incontinence related to his spinal surgery.  The differential diagnosis for his change in bowel habits could include hemorrhoids, constipation with overflow, rectal lesion, weak anal sphincter tone, pelvic floor dysfunction.  He is coming due for a surveillance colonoscopy due to a history of multiple tubular adenomas.  We have discussed proceeding with colonoscopy for further evaluation and additional workup can be coordinated pending that result.  He also has a history of longstanding GERD.  This is well-controlled with Protonix 40 mg p.o. twice daily.  He has a pertinent family history of Barrett's esophagus and his daughter and mother and his mother also had esophageal cancer.  His last upper endoscopy was in 2010.  Given his longstanding GERD and family history of esophageal cancer discussed that it would be appropriate to proceed with upper endoscopy at the same time as his colonoscopy.  Plan  Schedule EGD and colonoscopy at [facility] Continue Protonix 40 mg p.o. twice daily Handout provided regarding augmentation of fiber in diet for symptoms of bowel incontinence  Planned Follow Up TBD pending results of EGD and colonoscopy  The patient or caregiver verbalized understanding of the material covered, with no barriers to understanding. All questions were answered. Patient or caregiver is agreeable with the plan outlined above.    It was a pleasure to see George Alvarez.  If you have any questions or concerns regarding this evaluation, do  not hesitate to contact me.  Maren Beach, MD [facility]   I spent total of 45 minutes in both face-to-face and non-face-to-face activities, excluding procedures performed, for the visit on the date of this encounter.

## 2023-06-03 ENCOUNTER — Ambulatory Visit: Payer: PPO | Admitting: Pediatrics

## 2023-06-03 ENCOUNTER — Encounter: Payer: Self-pay | Admitting: Pediatrics

## 2023-06-03 VITALS — BP 120/62 | HR 67 | Ht 70.0 in | Wt 219.0 lb

## 2023-06-03 DIAGNOSIS — Z8379 Family history of other diseases of the digestive system: Secondary | ICD-10-CM | POA: Diagnosis not present

## 2023-06-03 DIAGNOSIS — Z860101 Personal history of adenomatous and serrated colon polyps: Secondary | ICD-10-CM | POA: Diagnosis not present

## 2023-06-03 DIAGNOSIS — Z860100 Personal history of colon polyps, unspecified: Secondary | ICD-10-CM

## 2023-06-03 DIAGNOSIS — R159 Full incontinence of feces: Secondary | ICD-10-CM | POA: Diagnosis not present

## 2023-06-03 DIAGNOSIS — K219 Gastro-esophageal reflux disease without esophagitis: Secondary | ICD-10-CM

## 2023-06-03 DIAGNOSIS — R152 Fecal urgency: Secondary | ICD-10-CM | POA: Diagnosis not present

## 2023-06-03 DIAGNOSIS — Z800 Family history of malignant neoplasm of digestive organs: Secondary | ICD-10-CM | POA: Diagnosis not present

## 2023-06-03 MED ORDER — SUFLAVE 178.7 G PO SOLR: 1.0000 | Freq: Once | ORAL | 0 refills | Status: AC

## 2023-06-03 NOTE — Patient Instructions (Signed)
 You have been scheduled for a colonoscopy. Please follow written instructions given to you at your visit today.   If you use inhalers (even only as needed), please bring them with you on the day of your procedure.  DO NOT TAKE 7 DAYS PRIOR TO TEST- Trulicity (dulaglutide) Ozempic, Wegovy (semaglutide) Mounjaro (tirzepatide) Bydureon Bcise (exanatide extended release)  DO NOT TAKE 1 DAY PRIOR TO YOUR TEST Rybelsus (semaglutide) Adlyxin (lixisenatide) Victoza (liraglutide) Byetta (exanatide) ___________________________________________________________________________  [name] will receive your bowel preparation through [facility], which ensures the lowest copay and home delivery, with outreach via text or call from an [phone] number. Please respond promptly to avoid rescheduling of your procedure. If you are interested in alternative options or have any questions regarding your prep, please contact them at [phone] ____________________________________________________________________________  Your Provider Has Sent Your Bowel Prep Regimen To [facility]   [facility] will contact you to verify your information and collect your copay, if applicable. Enjoy the comfort of your home while your prescription is mailed to you, FREE of any shipping charges.   [facility] accepts all major insurance benefits and applies discounts & coupons.  Have additional questions?   Chat: [email or web address] Call: [phone] Email: [email or web address] NCPDP: [id number]  How will [facility] contact you?  With a Welcome phone call,  a Welcome text and a checkout link in text form.  Texts you receive from [phone] Are NOT Spam.  *To set up delivery, you must complete the checkout process via link or speak to one of the patient care representatives. If [facility] is unable to reach you, your prescription may be delayed.  To avoid long hold times on the phone, you may also utilize the secure chat  feature on the [facility] website to request that they call you back for transaction completion or to expedite your concerns.   _______________________________________________________  If your blood pressure at your visit was 140/90 or greater, please contact your primary care physician to follow up on this.  _______________________________________________________  If you are age 97 or older, your body mass index should be between 23-30. Your Body mass index is 31.42 kg/m. If this is out of the aforementioned range listed, please consider follow up with your Primary Care Provider.  If you are age 38 or younger, your body mass index should be between 19-25. Your Body mass index is 31.42 kg/m. If this is out of the aformentioned range listed, please consider follow up with your Primary Care Provider.   ________________________________________________________  The [facility] providers would like to encourage you to use [facility] to communicate with providers for non-urgent requests or questions.  Due to long hold times on the telephone, sending your provider a message by [facility] may be a faster and more efficient way to get a response.  Please allow 48 business hours for a response.  Please remember that this is for non-urgent requests.  _______________________________________________________   It was a pleasure to see you today!  Thank you for trusting me with your gastrointestinal care!

## 2023-06-04 DIAGNOSIS — C44511 Basal cell carcinoma of skin of breast: Secondary | ICD-10-CM | POA: Diagnosis not present

## 2023-06-04 DIAGNOSIS — L570 Actinic keratosis: Secondary | ICD-10-CM | POA: Diagnosis not present

## 2023-06-04 DIAGNOSIS — D0471 Carcinoma in situ of skin of right lower limb, including hip: Secondary | ICD-10-CM | POA: Diagnosis not present

## 2023-06-09 ENCOUNTER — Other Ambulatory Visit: Payer: Self-pay | Admitting: Internal Medicine

## 2023-06-22 ENCOUNTER — Other Ambulatory Visit: Payer: Self-pay | Admitting: Internal Medicine

## 2023-06-27 ENCOUNTER — Other Ambulatory Visit: Payer: Self-pay | Admitting: Family Medicine

## 2023-06-29 ENCOUNTER — Other Ambulatory Visit: Payer: Self-pay | Admitting: Internal Medicine

## 2023-06-29 NOTE — Telephone Encounter (Signed)
Patient needs an appointment for future refills. 

## 2023-07-06 ENCOUNTER — Encounter: Payer: Self-pay | Admitting: Pediatrics

## 2023-07-13 NOTE — Progress Notes (Unsigned)
  Gastroenterology History and Physical   Primary Care Physician:  Wanda Plump, MD   Reason for Procedure:  GERD, family history of Barrett's esophagus and esophageal cancer, history of adenomatous colon polyps, fecal incontinence and urgency  Plan:    Upper endoscopy and colonoscopy   HPI: George Alvarez is a 78 y.o. male undergoing upper endoscopy and colonoscopy:  Fecal urgency/incontinence -- Reports morning urgency to have bowel movement -- Experienced episodes of incontinence when walking around -- Movements tend to be formed -denies diarrhea -- No anorectal pain or discomfort -- No melena or hematochezia -- Describes a previous history of urinary incontinence he associates with his prostate -- Underwent spinal surgery for spinal stenosis in 2019 but denies incontinence related to this -- No pelvic surgeries   GERD -- Reports longstanding history of GERD for many years -- Currently maintained on Protonix 40 mg p.o. twice daily which controls symptoms of reflux -- No Dysphagia or odynophagia -- Last EGD was performed in 2010 -irregular Z-line and gastric polyps -- Reports a family history of Barrett's esophagus and his daughter and mother -- Mother also had esophageal cancer   History of adenomatous polyps -- Last colonoscopy 2022 -7 polyps removed with pathology showing tubular adenomas - due this year for colonoscopy -- No family history of colorectal cancer polyps  Past Medical History:  Diagnosis Date   Allergy    seasonal   Anal fissure    Arthritis    Asthma, chronic 06/16/2007   Qualifier: Diagnosis of  By: Alwyn Ren MD, Chrissie Noa   Onset:as child Triggers (environmental, infectious, allergic): all, mainly environmental triggers Rescue inhaler FAO:ZHYQMV Maintenance medications/ response:Singulair,Qvar Smoking history:never Family history pulmonary disease: no     Basal cell carcinoma of chest wall 2016   1.8 cm, treated with electrodessication and  currettage   Benign paroxysmal positional vertigo 11/19/2012   Diagnosed at [facility], [place]  Physical therapy appointment pending    BPH (benign prostatic hyperplasia)    With urinary obstruction   Coronary artery disease    a. 02/2012 Cath/PCI: LM 10, LAD min irregs, LCX large, OM1 sm, 40 ost, OM2 95p (5.0x16 Veriflex & 4.5x12 Veriflex BMS'), RCA 30p, 44m, 30d, PDA/PLA min irregs, EF 65%   Diabetes mellitus without complication (HCC)    Diverticulosis    Erectile dysfunction due to arterial insufficiency    Fundic gland polyps of stomach, benign 2010   GERD (gastroesophageal reflux disease)    History of elevated PSA    History of kidney stones    Hyperlipidemia    Hypertension    Nocturia    Perennial allergic rhinitis    Peyronie's disease    Skin cancer    Basal and squamous cell cancers, greater than 20   Stroke (HCC) 2016   Tubular adenoma of colon 2010    Past Surgical History:  Procedure Laterality Date   COLONOSCOPY  2016   COLONOSCOPY  06/2020   CORONARY ANGIOPLASTY WITH STENT PLACEMENT  02/06/2012   OM2  bare metal    EPIDURAL BLOCK INJECTION  04-2015   Dr Murray Hodgkins   epidural steroids  2007, 2009   X 2 @ cervical &, lumbar)   HERNIA REPAIR     INTRAVASCULAR ULTRASOUND  02/06/2012   Procedure: INTRAVASCULAR ULTRASOUND;  Surgeon: Kathleene Hazel, MD;  Location: [facility] CATH LAB;  Service: Cardiovascular;;   LEFT HEART CATH AND CORONARY ANGIOGRAPHY N/A 09/30/2019   Procedure: LEFT HEART CATH AND CORONARY ANGIOGRAPHY;  Surgeon: Kathleene Hazel, MD;  Location: [facility] INVASIVE CV LAB;  Service: Cardiovascular;  Laterality: N/A;   LEFT HEART CATHETERIZATION WITH CORONARY ANGIOGRAM N/A 02/06/2012   Procedure: LEFT HEART CATHETERIZATION WITH CORONARY ANGIOGRAM;  Surgeon: Kathleene Hazel, MD;  Location: [facility] CATH LAB;  Service: Cardiovascular;  Laterality: N/A;   LUMBAR LAMINECTOMY/DECOMPRESSION MICRODISCECTOMY N/A 10/21/2017   Procedure: Lumbar Two-Three,  Lumbar Four-Five Discectomy;  Surgeon: Coletta Memos, MD;  Location: [facility] OR;  Service: Neurosurgery;  Laterality: N/A;   PERCUTANEOUS CORONARY STENT INTERVENTION (PCI-S)  02/06/2012   Procedure: PERCUTANEOUS CORONARY STENT INTERVENTION (PCI-S);  Surgeon: Kathleene Hazel, MD;  Location: [facility] CATH LAB;  Service: Cardiovascular;;   septoplasty     TONSILLECTOMY AND ADENOIDECTOMY     TRIGGER FINGER RELEASE     UPPER GASTROINTESTINAL ENDOSCOPY  2011   gastric polyps   VASECTOMY      Prior to Admission medications   Medication Sig Start Date End Date Taking? Authorizing Provider  albuterol (VENTOLIN HFA) 108 (90 Base) MCG/ACT inhaler Inhale 2 puffs into the lungs every 4 (four) hours as needed for wheezing or shortness of breath. 02/23/23   Ferol Luz, MD  aspirin EC 81 MG tablet Take 1 tablet (81 mg total) by mouth daily. 04/18/19   Kathleene Hazel, MD  azelastine (ASTELIN) 0.1 % nasal spray Place 2 sprays into both nostrils 2 (two) times daily. Use in each nostril as directed 05/19/23   Ferol Luz, MD  benralizumab [facility] PEN) 30 MG/ML prefilled autoinjector Inject 1 mL (30 mg total) into the skin every 8 (eight) weeks. 04/09/23   Ferol Luz, MD  Blood Glucose Monitoring Suppl [facility] VERIO) w/Device KIT Check blood sugars once daily 04/24/23   Wanda Plump, MD  budeson-glycopyrrolate-formoterol (BREZTRI AEROSPHERE) 160-9-4.8 MCG/ACT AERO INHALE 2 PUFFS INTO THE LUNGS IN THE MORNING AND AT BEDTIME. 06/29/23   Ferol Luz, MD  cetirizine (ZYRTEC) 10 MG tablet Take 10 mg by mouth daily.    [provider]  chlorhexidine (HIBICLENS) 4 % external liquid Apply topically daily as needed. 08/16/21   Donato Schultz, DO  EPINEPHrine (EPIPEN 2-PAK) 0.3 mg/0.3 mL IJ SOAJ injection Use as directed for severe allergic reactions Patient not taking: Reported on 06/03/2023 06/26/21   Ferol Luz, MD  fluticasone [facility]) 50 MCG/ACT nasal spray ONE SPRAY EACH  NOSTRIL TWICE A DAY IF NEEDED FOR STUFFY NOSE. 02/23/23   Ferol Luz, MD  fluticasone (FLONASE) 50 MCG/ACT nasal spray Place 2 sprays into both nostrils daily. 05/19/23   Ferol Luz, MD  glucose blood test strip Check blood sugars once daily 04/24/23   Wanda Plump, MD  hydrocortisone 2.5 % cream Apply 1 application topically daily as needed (Eczema).    [provider]  ketoconazole (NIZORAL) 2 % shampoo Apply 1 application topically once a week.    [provider]  Lancets [facility] ULTRASOFT) lancets Check blood sugars once daily 04/24/23   Wanda Plump, MD  metFORMIN (GLUCOPHAGE-XR) 500 MG 24 hr tablet Take 1 tablet (500 mg total) by mouth daily with breakfast. 04/24/23   Wanda Plump, MD  metoprolol tartrate (LOPRESSOR) 25 MG tablet Take 0.5 tablets (12.5 mg total) by mouth 2 (two) times daily. 06/09/23   Wanda Plump, MD  montelukast (SINGULAIR) 10 MG tablet Take 1 tablet (10 mg total) by mouth at bedtime. 01/28/23   Ferol Luz, MD  Multiple Vitamin (MULTIVITAMIN WITH MINERALS) TABS tablet Take 1 tablet by mouth daily.  [provider]  mupirocin ointment (BACTROBAN) 2 % Apply 1 Application topically 2 (two) times daily. 10/02/21   Wanda Plump, MD  olmesartan-hydrochlorothiazide (BENICAR HCT) 20-12.5 MG tablet Take 1 tablet by mouth daily. 03/16/23   Wanda Plump, MD  Omega-3 Fatty Acids (FISH OIL) 1200 MG CAPS Take 1,200 mg by mouth daily.    [provider]  pantoprazole (PROTONIX) 40 MG tablet TAKE 1 TABLET BY MOUTH TWICE A DAY 06/29/23   Ambs, Norvel Richards, FNP  rosuvastatin (CRESTOR) 40 MG tablet Take 1 tablet (40 mg total) by mouth daily. 06/22/23   Wanda Plump, MD  tadalafil (CIALIS) 5 MG tablet Take 5 mg by mouth daily as needed.    [provider]  tamsulosin (FLOMAX) 0.4 MG CAPS capsule Take 1 capsule (0.4 mg total) by mouth daily. 10/27/17   Coletta Memos, MD  triamcinolone cream (KENALOG) 0.1 % Apply 1 application topically daily as needed  (Eczema).  Patient not taking: Reported on 06/03/2023    [provider]    Current Outpatient Medications  Medication Sig Dispense Refill   albuterol (VENTOLIN HFA) 108 (90 Base) MCG/ACT inhaler Inhale 2 puffs into the lungs every 4 (four) hours as needed for wheezing or shortness of breath. 18 g 1   aspirin EC 81 MG tablet Take 1 tablet (81 mg total) by mouth daily. 90 tablet 3   azelastine (ASTELIN) 0.1 % nasal spray Place 2 sprays into both nostrils 2 (two) times daily. Use in each nostril as directed 30 mL 5   benralizumab (FASENRA PEN) 30 MG/ML prefilled autoinjector Inject 1 mL (30 mg total) into the skin every 8 (eight) weeks. 1 mL 6   Blood Glucose Monitoring Suppl (ONETOUCH VERIO) w/Device KIT Check blood sugars once daily 1 kit 0   budeson-glycopyrrolate-formoterol (BREZTRI AEROSPHERE) 160-9-4.8 MCG/ACT AERO INHALE 2 PUFFS INTO THE LUNGS IN THE MORNING AND AT BEDTIME. 10.7 each 1   cetirizine (ZYRTEC) 10 MG tablet Take 10 mg by mouth daily.     chlorhexidine (HIBICLENS) 4 % external liquid Apply topically daily as needed. 120 mL 0   EPINEPHrine (EPIPEN 2-PAK) 0.3 mg/0.3 mL IJ SOAJ injection Use as directed for severe allergic reactions (Patient not taking: Reported on 06/03/2023) 2 each 3   fluticasone (FLONASE) 50 MCG/ACT nasal spray ONE SPRAY EACH NOSTRIL TWICE A DAY IF NEEDED FOR STUFFY NOSE. 48 mL 1   fluticasone (FLONASE) 50 MCG/ACT nasal spray Place 2 sprays into both nostrils daily. 16 g 5   glucose blood test strip Check blood sugars once daily 100 each 12   hydrocortisone 2.5 % cream Apply 1 application topically daily as needed (Eczema).     ketoconazole (NIZORAL) 2 % shampoo Apply 1 application topically once a week.     Lancets (ONETOUCH ULTRASOFT) lancets Check blood sugars once daily 100 each 12   metFORMIN (GLUCOPHAGE-XR) 500 MG 24 hr tablet Take 1 tablet (500 mg total) by mouth daily with breakfast.     metoprolol tartrate (LOPRESSOR) 25 MG tablet Take 0.5  tablets (12.5 mg total) by mouth 2 (two) times daily. 90 tablet 1   montelukast (SINGULAIR) 10 MG tablet Take 1 tablet (10 mg total) by mouth at bedtime. 90 tablet 1   Multiple Vitamin (MULTIVITAMIN WITH MINERALS) TABS tablet Take 1 tablet by mouth daily.     mupirocin ointment (BACTROBAN) 2 % Apply 1 Application topically 2 (two) times daily. 22 g 0   olmesartan-hydrochlorothiazide (BENICAR HCT) 20-12.5 MG tablet  Take 1 tablet by mouth daily. 90 tablet 1   Omega-3 Fatty Acids (FISH OIL) 1200 MG CAPS Take 1,200 mg by mouth daily.     pantoprazole (PROTONIX) 40 MG tablet TAKE 1 TABLET BY MOUTH TWICE A DAY 180 tablet 2   rosuvastatin (CRESTOR) 40 MG tablet Take 1 tablet (40 mg total) by mouth daily. 90 tablet 1   tadalafil (CIALIS) 5 MG tablet Take 5 mg by mouth daily as needed.     tamsulosin (FLOMAX) 0.4 MG CAPS capsule Take 1 capsule (0.4 mg total) by mouth daily. 15 capsule 0   triamcinolone cream (KENALOG) 0.1 % Apply 1 application topically daily as needed (Eczema).  (Patient not taking: Reported on 06/03/2023)     Current Facility-Administered Medications  Medication Dose Route Frequency Provider Last Rate Last Admin   Benralizumab SOSY 30 mg  30 mg Subcutaneous Q8 Heath Lark, MD   30 mg at 05/20/23 1111    Allergies as of 07/14/2023 - Review Complete 06/03/2023  Allergen Reaction Noted   Nifedipine Dermatitis and Rash 01/08/2012    Family History  Problem Relation Age of Onset   Heart disease Mother    Breast cancer Mother    Esophageal cancer Mother        Barrett's   Heart attack Father 59       died @ 50   Heart attack Maternal Grandmother        after 7   Dementia Maternal Grandmother        CVA   Breast cancer Maternal Grandmother    Diabetes Maternal Grandmother    Stroke Maternal Grandmother        in 23s   Breast cancer Maternal Aunt    Heart attack Paternal Uncle 61   Colon cancer Neg Hx    Liver disease Neg Hx     Social History    Socioeconomic History   Marital status: Married    Spouse name: Not on file   Number of children: 3   Years of education: Not on file   Highest education level: Bachelor's degree (e.g., BA, AB, BS)  Occupational History   Occupation: Firefighter   Tobacco Use   Smoking status: Never   Smokeless tobacco: Never  Vaping Use   Vaping status: Never Used  Substance and Sexual Activity   Alcohol use: Not Currently    Comment: Wine occasionally   Drug use: No   Sexual activity: Yes    Partners: Female  Other Topics Concern   Not on file  Social History Narrative   3 daughters, 5 g-children   lifes w/ wife in [place]   Right handed   Two story home   Social Drivers of Health   Financial Resource Strain: Low Risk  (04/24/2023)   Overall Financial Resource Strain (CARDIA)    Difficulty of Paying Living Expenses: Not hard at all  Food Insecurity: No Food Insecurity (04/24/2023)   Hunger Vital Sign    Worried About Running Out of Food in the Last Year: Never true    Ran Out of Food in the Last Year: Never true  Transportation Needs: No Transportation Needs (04/24/2023)   PRAPARE - Administrator, Civil Service (Medical): No    Lack of Transportation (Non-Medical): No  Physical Activity: Insufficiently Active (04/24/2023)   Exercise Vital Sign    Days of Exercise per Week: 2 days    Minutes of Exercise per Session: 50 min  Stress:  No Stress Concern Present (04/24/2023)   [employer] of Occupational Health - Occupational Stress Questionnaire    Feeling of Stress : Only a little  Social Connections: Unknown (04/24/2023)   Social Connection and Isolation Panel [NHANES]    Frequency of Communication with Friends and Family: More than three times a week    Frequency of Social Gatherings with Friends and Family: Once a week    Attends Religious Services: Patient declined    Database administrator or Organizations: Yes    Attends Hospital doctor: More than 4 times per year    Marital Status: Married  Catering manager Violence: Not on file    Review of Systems:  All other review of systems negative except as mentioned in the HPI.  Physical Exam: Vital signs There were no vitals taken for this visit.  General:   Alert,  Well-developed, well-nourished, pleasant and cooperative in NAD Airway:  Mallampati  Lungs:  Clear throughout to auscultation.   Heart:  Regular rate and rhythm; no murmurs, clicks, rubs,  or gallops. Abdomen:  Soft, nontender and nondistended. Normal bowel sounds.   Neuro/Psych:  Normal mood and affect. A and O x 3  Maren Beach, MD [facility] Gastroenterology

## 2023-07-14 ENCOUNTER — Encounter: Payer: Self-pay | Admitting: Pediatrics

## 2023-07-14 ENCOUNTER — Ambulatory Visit: Payer: PPO | Admitting: Pediatrics

## 2023-07-14 VITALS — BP 111/69 | HR 67 | Temp 97.2°F | Resp 13 | Ht 70.0 in | Wt 219.0 lb

## 2023-07-14 DIAGNOSIS — K2289 Other specified disease of esophagus: Secondary | ICD-10-CM

## 2023-07-14 DIAGNOSIS — K317 Polyp of stomach and duodenum: Secondary | ICD-10-CM

## 2023-07-14 DIAGNOSIS — K5730 Diverticulosis of large intestine without perforation or abscess without bleeding: Secondary | ICD-10-CM | POA: Diagnosis not present

## 2023-07-14 DIAGNOSIS — Z860100 Personal history of colon polyps, unspecified: Secondary | ICD-10-CM

## 2023-07-14 DIAGNOSIS — Z1211 Encounter for screening for malignant neoplasm of colon: Secondary | ICD-10-CM

## 2023-07-14 DIAGNOSIS — R152 Fecal urgency: Secondary | ICD-10-CM

## 2023-07-14 DIAGNOSIS — K2270 Barrett's esophagus without dysplasia: Secondary | ICD-10-CM | POA: Diagnosis not present

## 2023-07-14 DIAGNOSIS — K6289 Other specified diseases of anus and rectum: Secondary | ICD-10-CM | POA: Diagnosis not present

## 2023-07-14 DIAGNOSIS — D122 Benign neoplasm of ascending colon: Secondary | ICD-10-CM

## 2023-07-14 DIAGNOSIS — Z8379 Family history of other diseases of the digestive system: Secondary | ICD-10-CM

## 2023-07-14 DIAGNOSIS — Z860101 Personal history of adenomatous and serrated colon polyps: Secondary | ICD-10-CM | POA: Diagnosis not present

## 2023-07-14 DIAGNOSIS — K648 Other hemorrhoids: Secondary | ICD-10-CM | POA: Diagnosis not present

## 2023-07-14 DIAGNOSIS — I10 Essential (primary) hypertension: Secondary | ICD-10-CM | POA: Diagnosis not present

## 2023-07-14 DIAGNOSIS — K219 Gastro-esophageal reflux disease without esophagitis: Secondary | ICD-10-CM | POA: Diagnosis not present

## 2023-07-14 DIAGNOSIS — E119 Type 2 diabetes mellitus without complications: Secondary | ICD-10-CM | POA: Diagnosis not present

## 2023-07-14 DIAGNOSIS — Z800 Family history of malignant neoplasm of digestive organs: Secondary | ICD-10-CM | POA: Diagnosis not present

## 2023-07-14 MED ORDER — SODIUM CHLORIDE 0.9 % IV SOLN
500.0000 mL | Freq: Once | INTRAVENOUS | Status: DC
Start: 2023-07-14 — End: 2023-07-14

## 2023-07-14 NOTE — Op Note (Signed)
 [facility] Patient Name: George Alvarez Procedure Date: 07/14/2023 1:30 PM MRN: [id number] Endoscopist: Maren Beach , MD, [id number] Age: 78 Referring MD:  Date of Birth: 01-Jul-1945 Gender: Male Account #: [id number] Procedure:                Colonoscopy Indications:              High risk colon cancer surveillance: Personal                            history of multiple (3 or more) adenomas, Last                            colonoscopy: 2022, incidental notation of fecal                            urgency and incontinence Medicines:                Monitored Anesthesia Care Procedure:                Pre-Anesthesia Assessment:                           - Prior to the procedure, a History and Physical                            was performed, and patient medications and                            allergies were reviewed. The patient's tolerance of                            previous anesthesia was also reviewed. The risks                            and benefits of the procedure and the sedation                            options and risks were discussed with the patient.                            All questions were answered, and informed consent                            was obtained. Prior Anticoagulants: The patient has                            taken no anticoagulant or antiplatelet agents. ASA                            Grade Assessment: III - A patient with severe                            systemic disease. After reviewing the risks and  benefits, the patient was deemed in satisfactory                            condition to undergo the procedure.                           After obtaining informed consent, the colonoscope                            was passed under direct vision. Throughout the                            procedure, the patient's blood pressure, pulse, and                            oxygen saturations were monitored  continuously. The                            Olympus CF-[id number] ([id number]) Colonoscope was                            introduced through the anus and advanced to the                            cecum, identified by appendiceal orifice and                            ileocecal valve. The colonoscopy was performed                            without difficulty. The patient tolerated the                            procedure well. The quality of the bowel                            preparation was adequate. The ileocecal valve,                            appendiceal orifice, and rectum were photographed. Scope In: 2:03:43 PM Scope Out: 2:22:27 PM Scope Withdrawal Time: 0 hours 13 minutes 57 seconds  Total Procedure Duration: 0 hours 18 minutes 44 seconds  Findings:                 Hemorrhoids were found on perianal exam.                           The digital rectal exam findings include decreased                            sphincter tone.                           Multiple small-mouthed diverticula were found in  the sigmoid colon.                           A 4 mm polyp was found in the ascending colon. The                            polyp was sessile. The polyp was removed with a                            cold biopsy forceps. Resection and retrieval were                            complete.                           Internal hemorrhoids were found during retroflexion. Complications:            No immediate complications. Estimated blood loss:                            Minimal. Estimated Blood Loss:     Estimated blood loss was minimal. Impression:               - Hemorrhoids found on perianal exam.                           - Decreased sphincter tone found on digital rectal                            exam. This may be contributing to fecal urgency and                            incontinence.                           - Diverticulosis in the sigmoid colon.                            - One 4 mm polyp in the ascending colon, removed                            with a cold biopsy forceps. Resected and retrieved.                           - Internal hemorrhoids. Recommendation:           - Discharge patient to home (ambulatory).                           - Await pathology results.                           - Based upon age can likely cease further                            surveillance colonoscopies.                           -  Recommend use of fiber to bulk stools in the                            setting of fecal urgency and incontinence.                           - The findings and recommendations were discussed                            with the patient's family. Maren Beach, MD 07/14/2023 2:34:11 PM This report has been signed electronically.

## 2023-07-14 NOTE — Op Note (Signed)
 [facility] Patient Name: George Alvarez Procedure Date: 07/14/2023 1:31 PM MRN: [id number] Endoscopist: Maren Beach , MD, [id number] Age: 78 Referring MD:  Date of Birth: 11-04-1945 Gender: Male Account #: [id number] Procedure:                Upper GI endoscopy Indications:              Follow-up of gastro-esophageal reflux disease,                            Family history of esophageal cancer, Family history                            of Barrett's esophagus Medicines:                Monitored Anesthesia Care Procedure:                Pre-Anesthesia Assessment:                           - Prior to the procedure, a History and Physical                            was performed, and patient medications and                            allergies were reviewed. The patient's tolerance of                            previous anesthesia was also reviewed. The risks                            and benefits of the procedure and the sedation                            options and risks were discussed with the patient.                            All questions were answered, and informed consent                            was obtained. Prior Anticoagulants: The patient has                            taken no anticoagulant or antiplatelet agents. ASA                            Grade Assessment: III - A patient with severe                            systemic disease. After reviewing the risks and                            benefits, the patient was deemed in satisfactory  condition to undergo the procedure.                           After obtaining informed consent, the endoscope was                            passed under direct vision. Throughout the                            procedure, the patient's blood pressure, pulse, and                            oxygen saturations were monitored continuously. The                            GIF [id number] #[id number] was  introduced through the                            mouth, and advanced to the second part of duodenum.                            The upper GI endoscopy was accomplished without                            difficulty. The patient tolerated the procedure                            well. Scope In: Scope Out: Findings:                 The upper third of the esophagus and middle third                            of the esophagus were normal.                           The Z-line was irregular with one mucosal island.                            Mucosa was biopsied with a cold forceps for                            histology in the lower third of the esophagus. Rule                            out Barrett's esophagus.                           Multiple 4 to 7 mm sessile polyps were found in the                            cardia, in the gastric fundus and in the gastric  body. Biopsies were taken with a cold forceps for                            histology. The gastric tissue otherwise appeared                            normal.                           The cardia and gastric fundus were normal. No                            hiatal hernia seen.                           The duodenal bulb and second portion of the                            duodenum were normal. Complications:            No immediate complications. Estimated blood loss:                            Minimal. Estimated Blood Loss:     Estimated blood loss was minimal. Impression:               - Normal upper third of esophagus and middle third                            of esophagus.                           - Z-line irregular. Biopsied. Rule out Barrett's                            esophagus.                           - Multiple gastric polyps. Biopsied.                           - Normal cardia and gastric fundus.                           - Normal duodenal bulb and second portion of the                             duodenum. Recommendation:           - Await pathology results.                           - Perform a colonoscopy today.                           - The findings and recommendations were discussed  with the patient's family. Maren Beach, MD 07/14/2023 2:27:54 PM This report has been signed electronically.

## 2023-07-14 NOTE — Progress Notes (Signed)
 Vss nad trans to pacu

## 2023-07-14 NOTE — Patient Instructions (Signed)
 Handouts provided on polyps, diverticulosis, hemorrhoids, high-fiber diet and fiber supplements.  Resume previous diet.  Continue present medications..  Await pathology results.  Recommend use of fiber to bulk stools in the setting of fecal urgency and incontinence.   YOU HAD AN ENDOSCOPIC PROCEDURE TODAY AT THE [facility]:   Refer to the procedure report that was given to you for any specific questions about what was found during the examination.  If the procedure report does not answer your questions, please call your gastroenterologist to clarify.  If you requested that your care partner not be given the details of your procedure findings, then the procedure report has been included in a sealed envelope for you to review at your convenience later.  YOU SHOULD EXPECT: Some feelings of bloating in the abdomen. Passage of more gas than usual.  Walking can help get rid of the air that was put into your GI tract during the procedure and reduce the bloating. If you had a lower endoscopy (such as a colonoscopy or flexible sigmoidoscopy) you may notice spotting of blood in your stool or on the toilet paper. If you underwent a bowel prep for your procedure, you may not have a normal bowel movement for a few days.  Please Note:  You might notice some irritation and congestion in your nose or some drainage.  This is from the oxygen used during your procedure.  There is no need for concern and it should clear up in a day or so.  SYMPTOMS TO REPORT IMMEDIATELY:  Following lower endoscopy (colonoscopy or flexible sigmoidoscopy):  Excessive amounts of blood in the stool  Significant tenderness or worsening of abdominal pains  Swelling of the abdomen that is new, acute  Fever of 100F or higher  Following upper endoscopy (EGD)  Vomiting of blood or coffee ground material  New chest pain or pain under the shoulder blades  Painful or persistently difficult swallowing  New shortness of  breath  Fever of 100F or higher  Black, tarry-looking stools  For urgent or emergent issues, a gastroenterologist can be reached at any hour by calling [phone]. Do not use MyChart messaging for urgent concerns.    DIET:  We do recommend a small meal at first, but then you may proceed to your regular diet.  Drink plenty of fluids but you should avoid alcoholic beverages for 24 hours.  ACTIVITY:  You should plan to take it easy for the rest of today and you should NOT DRIVE or use heavy machinery until tomorrow (because of the sedation medicines used during the test).    FOLLOW UP: Our staff will call the number listed on your records the next business day following your procedure.  We will call around 7:15- 8:00 am to check on you and address any questions or concerns that you may have regarding the information given to you following your procedure. If we do not reach you, we will leave a message.     If any biopsies were taken you will be contacted by phone or by letter within the next 1-3 weeks.  Please call us at [phone] if you have not heard about the biopsies in 3 weeks.    SIGNATURES/CONFIDENTIALITY: You and/or your care partner have signed paperwork which will be entered into your electronic medical record.  These signatures attest to the fact that that the information above on your After Visit Summary has been reviewed and is understood.  Full responsibility of the confidentiality  of this discharge information lies with you and/or your care-partner.

## 2023-07-14 NOTE — Progress Notes (Signed)
 Called to room to assist during endoscopic procedure.  Patient ID and intended procedure confirmed with present staff. Received instructions for my participation in the procedure from the performing physician.

## 2023-07-15 ENCOUNTER — Telehealth: Payer: Self-pay

## 2023-07-15 ENCOUNTER — Ambulatory Visit: Payer: PPO

## 2023-07-15 DIAGNOSIS — J4550 Severe persistent asthma, uncomplicated: Secondary | ICD-10-CM | POA: Diagnosis not present

## 2023-07-15 NOTE — Telephone Encounter (Signed)
 Left message on answering machine.

## 2023-07-20 LAB — SURGICAL PATHOLOGY

## 2023-07-21 ENCOUNTER — Encounter: Payer: Self-pay | Admitting: Pediatrics

## 2023-07-25 ENCOUNTER — Encounter: Payer: Self-pay | Admitting: Pediatrics

## 2023-07-27 MED ORDER — SUCRALFATE 1 G PO TABS: 1.0000 g | ORAL_TABLET | Freq: Three times a day (TID) | ORAL | 1 refills | Status: DC

## 2023-07-27 NOTE — Telephone Encounter (Signed)
 Carafate  RX sent to pharmacy on file.

## 2023-08-07 ENCOUNTER — Encounter: Payer: Self-pay | Admitting: Podiatry

## 2023-08-07 ENCOUNTER — Ambulatory Visit: Admitting: Podiatry

## 2023-08-07 DIAGNOSIS — M79674 Pain in right toe(s): Secondary | ICD-10-CM | POA: Diagnosis not present

## 2023-08-07 DIAGNOSIS — B351 Tinea unguium: Secondary | ICD-10-CM

## 2023-08-07 DIAGNOSIS — M79675 Pain in left toe(s): Secondary | ICD-10-CM | POA: Diagnosis not present

## 2023-08-07 DIAGNOSIS — E119 Type 2 diabetes mellitus without complications: Secondary | ICD-10-CM | POA: Diagnosis not present

## 2023-08-07 NOTE — Progress Notes (Signed)
  Subjective:  Patient ID: George Alvarez, male    DOB: [birth year],   MRN: [id number]  No chief complaint on file.   78 y.o. male presents for diabetic foot exam and  concern of thickened elongated and painful nails that are difficult to trim.   Relates burning and tingling in their feet. Patient is diabetic and last A1c was  Lab Results  Component Value Date   HGBA1C 6.6 (H) 04/24/2023   .   PCP:  Ezell Hollow, MD    . Denies any other pedal complaints. Denies n/v/f/c.   Past Medical History:  Diagnosis Date   Allergy    seasonal   Anal fissure    Arthritis    Asthma, chronic 06/16/2007   Qualifier: Diagnosis of  By: Donnice Gale MD, Sammie Crigler   Onset:as child Triggers (environmental, infectious, allergic): all, mainly environmental triggers Rescue inhaler WUJ:WJXBJY Maintenance medications/ response:Singulair ,Qvar  Smoking history:never Family history pulmonary disease: no     Basal cell carcinoma of chest wall 2016   1.8 cm, treated with electrodessication and currettage   Benign paroxysmal positional vertigo 11/19/2012   Diagnosed at [facility], [place]  Physical therapy appointment pending    BPH (benign prostatic hyperplasia)    With urinary obstruction   Coronary artery disease    a. 02/2012 Cath/PCI: LM 10, LAD min irregs, LCX large, OM1 sm, 40 ost, OM2 95p (5.0x16 Veriflex & 4.5x12 Veriflex BMS'), RCA 30p, 24m, 30d, PDA/PLA min irregs, EF 65%   Diabetes mellitus without complication (HCC)    Diverticulosis    Erectile dysfunction due to arterial insufficiency    Fundic gland polyps of stomach, benign 2010   GERD (gastroesophageal reflux disease)    History of elevated PSA    History of kidney stones    Hyperlipidemia    Hypertension    Nocturia    Perennial allergic rhinitis    Peyronie's disease    Skin cancer    Basal and squamous cell cancers, greater than 20   Stroke (HCC) 2016   Tubular adenoma of colon 2010    Objective:  Physical Exam: Vascular: DP/PT  pulses 2/4 bilateral. CFT <3 seconds. Absent hair growth on digits. Edema noted to bilateral lower extremities. Xerosis noted bilaterally.  Skin. No lacerations or abrasions bilateral feet. Nails 1-5 bilateral  are thickened discolored and elongated with subungual debris. Small area of hemorrhage from cutting himself while attempting to trim his nails.  Musculoskeletal: MMT 5/5 bilateral lower extremities in DF, PF, Inversion and Eversion. Deceased ROM in DF of ankle joint.  Neurological: Sensation intact to light touch. Protective sensation diminished bilateral.    Assessment:   1. Pain due to onychomycosis of toenails of both feet   2. Diabetes mellitus without complication (HCC)       Plan:  Patient was evaluated and treated and all questions answered. -Discussed and educated patient on diabetic foot care, especially with  regards to the vascular, neurological and musculoskeletal systems.  -Stressed the importance of good glycemic control and the detriment of not  controlling glucose levels in relation to the foot. -Discussed supportive shoes at all times and checking feet regularly.  -Mechanically debrided all nails 1-5 bilateral using sterile nail nipper and filed with dremel without incident  -Answered all patient questions -Patient to return  in 3 months for at risk foot care -Patient advised to call the office if any problems or questions arise in the meantime.   Jennefer Moats, DPM

## 2023-08-17 ENCOUNTER — Encounter (HOSPITAL_COMMUNITY): Payer: Self-pay

## 2023-08-24 ENCOUNTER — Ambulatory Visit: Payer: PPO | Admitting: Internal Medicine

## 2023-08-24 ENCOUNTER — Encounter: Payer: Self-pay | Admitting: Internal Medicine

## 2023-08-24 ENCOUNTER — Ambulatory Visit (INDEPENDENT_AMBULATORY_CARE_PROVIDER_SITE_OTHER): Payer: PPO | Admitting: Internal Medicine

## 2023-08-24 VITALS — BP 126/80 | HR 63 | Temp 97.9°F | Resp 12 | Ht 70.0 in | Wt 218.6 lb

## 2023-08-24 DIAGNOSIS — I10 Essential (primary) hypertension: Secondary | ICD-10-CM

## 2023-08-24 DIAGNOSIS — E785 Hyperlipidemia, unspecified: Secondary | ICD-10-CM | POA: Diagnosis not present

## 2023-08-24 DIAGNOSIS — Z0000 Encounter for general adult medical examination without abnormal findings: Secondary | ICD-10-CM

## 2023-08-24 DIAGNOSIS — Z0001 Encounter for general adult medical examination with abnormal findings: Secondary | ICD-10-CM

## 2023-08-24 DIAGNOSIS — Z7984 Long term (current) use of oral hypoglycemic drugs: Secondary | ICD-10-CM | POA: Diagnosis not present

## 2023-08-24 DIAGNOSIS — E1142 Type 2 diabetes mellitus with diabetic polyneuropathy: Secondary | ICD-10-CM | POA: Diagnosis not present

## 2023-08-24 DIAGNOSIS — E119 Type 2 diabetes mellitus without complications: Secondary | ICD-10-CM

## 2023-08-24 NOTE — Assessment & Plan Note (Signed)
 Here for CPX - Td 2019 - Pneumonia shot 2013 ;  Prevnar 2015;  PNM 20: 2022  - zostavax 2013;  s/p shingrex; s/p RSV -Vaccines I recommend:  COVID booster if not done by 12-2022, flu shot every fall. - Prostate Ca screening: per urology  -Colonoscopy in 2010, 08-2014,  C-scope 05/30/2020, Colonoscopy 07/14/2023, next per GI - Labs: CMP CBC A1c -ACP: Documents on file

## 2023-08-24 NOTE — Progress Notes (Addendum)
 Subjective:    Patient ID: George Alvarez, male    DOB: 1945/11/26, 78 y.o.   MRN: [id number]  DOS:  08/24/2023 Type of visit - description: Here for CPX  Here for CPX. Feeling well.  Denies chest pain or difficulty breathing. LUTS: Symptoms at baseline. Specifically denies blood in the stools.  Admits to some numbness on the feet mostly at night.  Review of Systems  Other than above, a 14 point review of systems is negative     Past Medical History:  Diagnosis Date   Allergy    seasonal   Anal fissure    Arthritis    Asthma, chronic 06/16/2007   Qualifier: Diagnosis of  By: Donnice Gale MD, Sammie Crigler   Onset:as child Triggers (environmental, infectious, allergic): all, mainly environmental triggers Rescue inhaler WUJ:WJXBJY Maintenance medications/ response:Singulair ,Qvar  Smoking history:never Family history pulmonary disease: no     Basal cell carcinoma of chest wall 2016   1.8 cm, treated with electrodessication and currettage   Benign paroxysmal positional vertigo 11/19/2012   Diagnosed at [facility], [place]  Physical therapy appointment pending    BPH (benign prostatic hyperplasia)    With urinary obstruction   Coronary artery disease    a. 02/2012 Cath/PCI: LM 10, LAD min irregs, LCX large, OM1 sm, 40 ost, OM2 95p (5.0x16 Veriflex & 4.5x12 Veriflex BMS'), RCA 30p, 68m, 30d, PDA/PLA min irregs, EF 65%   Diabetes mellitus without complication (HCC)    Diverticulosis    Erectile dysfunction due to arterial insufficiency    Fundic gland polyps of stomach, benign 2010   GERD (gastroesophageal reflux disease)    History of elevated PSA    History of kidney stones    Hyperlipidemia    Hypertension    Nocturia    Perennial allergic rhinitis    Peyronie's disease    Skin cancer    Basal and squamous cell cancers, greater than 20   Stroke (HCC) 2016   Tubular adenoma of colon 2010    Past Surgical History:  Procedure Laterality Date   COLONOSCOPY  2016    COLONOSCOPY  06/2020   CORONARY ANGIOPLASTY WITH STENT PLACEMENT  02/06/2012   OM2  bare metal    EPIDURAL BLOCK INJECTION  04-2015   Dr Eben Golds   epidural steroids  2007, 2009   X 2 @ cervical &, lumbar)   HERNIA REPAIR     INTRAVASCULAR ULTRASOUND  02/06/2012   Procedure: INTRAVASCULAR ULTRASOUND;  Surgeon: Odie Benne, MD;  Location: [facility] CATH LAB;  Service: Cardiovascular;;   LEFT HEART CATH AND CORONARY ANGIOGRAPHY N/A 09/30/2019   Procedure: LEFT HEART CATH AND CORONARY ANGIOGRAPHY;  Surgeon: Odie Benne, MD;  Location: [facility] INVASIVE CV LAB;  Service: Cardiovascular;  Laterality: N/A;   LEFT HEART CATHETERIZATION WITH CORONARY ANGIOGRAM N/A 02/06/2012   Procedure: LEFT HEART CATHETERIZATION WITH CORONARY ANGIOGRAM;  Surgeon: Odie Benne, MD;  Location: [facility] CATH LAB;  Service: Cardiovascular;  Laterality: N/A;   LUMBAR LAMINECTOMY/DECOMPRESSION MICRODISCECTOMY N/A 10/21/2017   Procedure: Lumbar Two-Three, Lumbar Four-Five Discectomy;  Surgeon: Audie Bleacher, MD;  Location: [facility] OR;  Service: Neurosurgery;  Laterality: N/A;   PERCUTANEOUS CORONARY STENT INTERVENTION (PCI-S)  02/06/2012   Procedure: PERCUTANEOUS CORONARY STENT INTERVENTION (PCI-S);  Surgeon: Odie Benne, MD;  Location: [facility] CATH LAB;  Service: Cardiovascular;;   septoplasty     TONSILLECTOMY AND ADENOIDECTOMY     TRIGGER FINGER RELEASE     UPPER GASTROINTESTINAL ENDOSCOPY  2011  gastric polyps   VASECTOMY      Current Outpatient Medications  Medication Instructions   albuterol  (VENTOLIN  HFA) 108 (90 Base) MCG/ACT inhaler 2 puffs, Inhalation, Every 4 hours PRN   aspirin  EC 81 mg, Oral, Daily   azelastine  (ASTELIN ) 0.1 % nasal spray 2 sprays, Each Nare, 2 times daily, Use in each nostril as directed   Blood Glucose Monitoring Suppl (ONETOUCH VERIO) w/Device KIT Check blood sugars once daily   budeson-glycopyrrolate-formoterol  (BREZTRI  AEROSPHERE) 160-9-4.8 MCG/ACT AERO 2 puffs,  Inhalation, 2 times daily   cetirizine (ZYRTEC) 10 mg, Daily   chlorhexidine  (HIBICLENS ) 4 % external liquid Topical, Daily PRN   EPINEPHrine  (EPIPEN  2-PAK) 0.3 mg/0.3 mL IJ SOAJ injection Use as directed for severe allergic reactions   Fasenra  Pen 30 mg, Subcutaneous, Every 8 weeks   Fish Oil 1,200 mg, Daily   fluticasone  (FLONASE ) 50 MCG/ACT nasal spray 2 sprays, Each Nare, Daily   glucose blood test strip Check blood sugars once daily   hydrocortisone  2.5 % cream 1 application , Daily PRN   ketoconazole  (NIZORAL ) 2 % shampoo 1 application , Weekly   Lancets (ONETOUCH ULTRASOFT) lancets Check blood sugars once daily   metFORMIN  (GLUCOPHAGE -XR) 500 mg, Daily with breakfast   metoprolol  tartrate (LOPRESSOR ) 12.5 mg, Oral, 2 times daily   montelukast  (SINGULAIR ) 10 mg, Oral, Daily at bedtime   Multiple Vitamin (MULTIVITAMIN WITH MINERALS) TABS tablet 1 tablet, Daily   mupirocin  ointment (BACTROBAN ) 2 % 1 Application, Topical, 2 times daily   olmesartan -hydrochlorothiazide  (BENICAR  HCT) 20-12.5 MG tablet 1 tablet, Oral, Daily   pantoprazole  (PROTONIX ) 40 mg, Oral, 2 times daily   rosuvastatin  (CRESTOR ) 40 mg, Oral, Daily   tadalafil  (CIALIS ) 5 mg, Daily PRN   tamsulosin  (FLOMAX ) 0.4 mg, Oral, Daily   triamcinolone  cream (KENALOG ) 0.1 % 1 application , Daily PRN       Objective:   Physical Exam BP 126/80 (BP Location: Right Arm, Patient Position: Sitting, Cuff Size: Normal)   Pulse 63   Temp 97.9 F (36.6 C) (Oral)   Resp 12   Ht 5\' 10"  (1.778 m)   Wt 218 lb 9.6 oz (99.2 kg)   SpO2 95%   BMI 31.37 kg/m  General: Well developed, NAD, BMI noted Neck: No  thyromegaly  HEENT:  Normocephalic . Face symmetric, atraumatic Lungs:  CTA B Normal respiratory effort, no intercostal retractions, no accessory muscle use. Heart: RRR,  no murmur.  Abdomen:  Not distended, soft, non-tender. No rebound or rigidity.   DM foot exam: No edema, good pedal pulses, pinprick examination: Slightly  diminished distally Skin: Exposed areas without rash. Not pale. Not jaundice Neurologic:  alert & oriented X3.  Speech normal, gait appropriate for age and unassisted Strength symmetric and appropriate for age.  Psych: Cognition and judgment appear intact.  Cooperative with normal attention span and concentration.  Behavior appropriate. No anxious or depressed appearing.     Assessment   Problem List DM (A1C 6.09 September 2020) Neuropathy: Dx 08/2023 HTN Hyperlipidemia GERD , EGD 07/2023: Barrett's per bx, next  EGD 3 years CV: Dr Abel Hoe --CAD --Stroke, seen in a MRI Asthma- per allergist  [facility] --DJD knees used to see Dr Rosamond Comes 2016, local injections --Spinal stenosis (neck-back) - Dr Benedetta Bradley dx ~ 2008 via MRI neck, back, had local injections 2008; then 04-2015 -- lumbar laminectomy 2019 -- low back R nerve ablation ~ 2022 (helped w/ radiculopathy) BPV, off balance (seen by neuro 2020)  GU: --Elevated PSA -- Dr Inga Manges --  Peyronie's  Disease Skin cancer : [facility] Dr Raynaldo Call    PLAN: Here for CPX - Td 2019 - Pneumonia shot 2013 ;  Prevnar 2015;  PNM 20: 2022  - zostavax 2013;  s/p shingrex; s/p RSV -Vaccines I recommend:  COVID booster if not done by 12-2022, flu shot every fall. - Prostate Ca screening: per urology  -Colonoscopy in 2010, 08-2014,  C-scope 05/30/2020, Colonoscopy 07/14/2023, next per GI - Labs: CMP CBC A1c -ACP: Documents on file  Other issues :  DM: Ambulatory CBG okay, yesterday 117, continue metformin . Check A1c. Neuropathy: new. Likely from diabetes, see physical exam, feet care discussed. HTN: Ambulatory BPs when checked always WNL.  Continue metoprolol , Benicar  HCT, check CMP and CBC High cholesterol: On Crestor , last LDL at goal. CAD: Denies angina. RTC 4 to 5 months

## 2023-08-24 NOTE — Assessment & Plan Note (Signed)
 Here for CPX   Other issues :  DM: Ambulatory CBG okay, yesterday 117, continue metformin . Check A1c. Neuropathy: new. Likely from diabetes, see physical exam, feet care discussed. HTN: Ambulatory BPs when checked always WNL.  Continue metoprolol , Benicar  HCT, check CMP and CBC High cholesterol: On Crestor , last LDL at goal. CAD: Denies angina. RTC 4 to 5 months

## 2023-08-24 NOTE — Patient Instructions (Signed)
 Recommend a flu shot every fall. A COVID booster if not done by 12-2022   Check the  blood pressure regularly Blood pressure goal:  between 110/65 and  135/85. If it is consistently higher or lower, let me know  Diabetes: You can check your sugars at different times,  - early in AM fasting  ( blood sugar goal 70-130) - 2 hours after a meal (blood sugar goal less than 180) - bedtime (goal 90-150)       GO TO THE LAB : Get the blood work     Next office visit for a checkup in 4 to 5 months Please make an appointment before you leave today   Diabetes Mellitus and Foot Care Diabetes, also called diabetes mellitus, may cause problems with your feet and legs because of poor blood flow (circulation). Poor circulation may make your skin: Become thinner and drier. Break more easily. Heal more slowly. Peel and crack. You may also have nerve damage (neuropathy). This can cause decreased feeling in your legs and feet. This means that you may not notice minor injuries to your feet that could lead to more serious problems. Finding and treating problems early is the best way to prevent future foot problems. How to care for your feet Foot hygiene  Wash your feet daily with warm water and mild soap. Do not use hot water. Then, pat your feet and the areas between your toes until they are fully dry. Do not soak your feet. This can dry your skin. Trim your toenails straight across. Do not dig under them or around the cuticle. File the edges of your nails with an emery board or nail file. Apply a moisturizing lotion or petroleum jelly to the skin on your feet and to dry, brittle toenails. Use lotion that does not contain alcohol and is unscented. Do not apply lotion between your toes. Shoes and socks Wear clean socks or stockings every day. Make sure they are not too tight. Do not wear knee-high stockings. These may decrease blood flow to your legs. Wear shoes that fit well and have enough  cushioning. Always look in your shoes before you put them on to be sure there are no objects inside. To break in new shoes, wear them for just a few hours a day. This prevents injuries on your feet. Wounds, scrapes, corns, and calluses  Check your feet daily for blisters, cuts, bruises, sores, and redness. If you cannot see the bottom of your feet, use a mirror or ask someone for help. Do not cut off corns or calluses or try to remove them with medicine. If you find a minor scrape, cut, or break in the skin on your feet, keep it and the skin around it clean and dry. You may clean these areas with mild soap and water. Do not clean the area with peroxide, alcohol, or iodine. If you have a wound, scrape, corn, or callus on your foot, look at it several times a day to make sure it is healing and not infected. Check for: Redness, swelling, or pain. Fluid or blood. Warmth. Pus or a bad smell. General tips Do not cross your legs. This may decrease blood flow to your feet. Do not use heating pads or hot water bottles on your feet. They may burn your skin. If you have lost feeling in your feet or legs, you may not know this is happening until it is too late. Protect your feet from hot and cold by wearing  shoes, such as at the beach or on hot pavement. Schedule a complete foot exam at least once a year or more often if you have foot problems. Report any cuts, sores, or bruises to your health care provider right away. Where to find more information American Diabetes Association: diabetes.org Association of Diabetes Care & Education Specialists: diabeteseducator.org Contact a health care provider if: You have a condition that increases your risk of infection, and you have any cuts, sores, or bruises on your feet. You have an injury that is not healing. You have redness on your legs or feet. You feel burning or tingling in your legs or feet. You have pain or cramps in your legs and feet. Your legs or  feet are numb. Your feet always feel cold. You have pain around any toenails. Get help right away if: You have a wound, scrape, corn, or callus on your foot and: You have signs of infection. You have a fever. You have a red line going up your leg. This information is not intended to replace advice given to you by your health care provider. Make sure you discuss any questions you have with your health care provider. Document Revised: 09/25/2021 Document Reviewed: 09/25/2021 Elsevier Patient Education  2024 [employer].

## 2023-08-25 LAB — CBC WITH DIFFERENTIAL/PLATELET
Basophils Absolute: 0 10*3/uL (ref 0.0–0.1)
Basophils Relative: 0.5 % (ref 0.0–3.0)
Eosinophils Absolute: 0 10*3/uL (ref 0.0–0.7)
Eosinophils Relative: 0 % (ref 0.0–5.0)
HCT: 42.3 % (ref 39.0–52.0)
Hemoglobin: 14.3 g/dL (ref 13.0–17.0)
Lymphocytes Relative: 25.6 % (ref 12.0–46.0)
Lymphs Abs: 1.6 10*3/uL (ref 0.7–4.0)
MCHC: 33.9 g/dL (ref 30.0–36.0)
MCV: 87.7 fl (ref 78.0–100.0)
Monocytes Absolute: 0.5 10*3/uL (ref 0.1–1.0)
Monocytes Relative: 7.9 % (ref 3.0–12.0)
Neutro Abs: 4.2 10*3/uL (ref 1.4–7.7)
Neutrophils Relative %: 66 % (ref 43.0–77.0)
Platelets: 143 10*3/uL — ABNORMAL LOW (ref 150.0–400.0)
RBC: 4.82 Mil/uL (ref 4.22–5.81)
RDW: 14.1 % (ref 11.5–15.5)
WBC: 6.4 10*3/uL (ref 4.0–10.5)

## 2023-08-25 LAB — COMPREHENSIVE METABOLIC PANEL WITH GFR
ALT: 19 U/L (ref 0–53)
AST: 22 U/L (ref 0–37)
Albumin: 4.5 g/dL (ref 3.5–5.2)
Alkaline Phosphatase: 44 U/L (ref 39–117)
BUN: 23 mg/dL (ref 6–23)
CO2: 21 meq/L (ref 19–32)
Calcium: 9.4 mg/dL (ref 8.4–10.5)
Chloride: 106 meq/L (ref 96–112)
Creatinine, Ser: 1.34 mg/dL (ref 0.40–1.50)
GFR: 51.83 mL/min — ABNORMAL LOW (ref >60.00)
Glucose, Bld: 118 mg/dL — ABNORMAL HIGH (ref 70–99)
Potassium: 4.3 meq/L (ref 3.5–5.1)
Sodium: 141 meq/L (ref 135–145)
Total Bilirubin: 0.5 mg/dL (ref 0.2–1.2)
Total Protein: 6.4 g/dL (ref 6.0–8.3)

## 2023-08-25 LAB — HEMOGLOBIN A1C: Hgb A1c MFr Bld: 6.6 % — ABNORMAL HIGH (ref 4.6–6.5)

## 2023-08-27 ENCOUNTER — Ambulatory Visit: Payer: Self-pay | Admitting: Internal Medicine

## 2023-09-09 ENCOUNTER — Ambulatory Visit

## 2023-09-09 DIAGNOSIS — J4550 Severe persistent asthma, uncomplicated: Secondary | ICD-10-CM | POA: Diagnosis not present

## 2023-09-18 ENCOUNTER — Other Ambulatory Visit: Payer: Self-pay | Admitting: Internal Medicine

## 2023-10-02 ENCOUNTER — Other Ambulatory Visit: Payer: Self-pay | Admitting: Internal Medicine

## 2023-10-07 DIAGNOSIS — Z86008 Personal history of in-situ neoplasm of other site: Secondary | ICD-10-CM | POA: Diagnosis not present

## 2023-10-07 DIAGNOSIS — Z129 Encounter for screening for malignant neoplasm, site unspecified: Secondary | ICD-10-CM | POA: Diagnosis not present

## 2023-10-07 DIAGNOSIS — L570 Actinic keratosis: Secondary | ICD-10-CM | POA: Diagnosis not present

## 2023-10-07 DIAGNOSIS — B078 Other viral warts: Secondary | ICD-10-CM | POA: Diagnosis not present

## 2023-10-07 DIAGNOSIS — Z85828 Personal history of other malignant neoplasm of skin: Secondary | ICD-10-CM | POA: Diagnosis not present

## 2023-11-04 ENCOUNTER — Ambulatory Visit (INDEPENDENT_AMBULATORY_CARE_PROVIDER_SITE_OTHER)

## 2023-11-04 DIAGNOSIS — J4550 Severe persistent asthma, uncomplicated: Secondary | ICD-10-CM | POA: Diagnosis not present

## 2023-11-06 ENCOUNTER — Ambulatory Visit: Admitting: Podiatry

## 2023-11-06 ENCOUNTER — Encounter: Payer: Self-pay | Admitting: Podiatry

## 2023-11-06 DIAGNOSIS — M79674 Pain in right toe(s): Secondary | ICD-10-CM

## 2023-11-06 DIAGNOSIS — M79675 Pain in left toe(s): Secondary | ICD-10-CM | POA: Diagnosis not present

## 2023-11-06 DIAGNOSIS — B351 Tinea unguium: Secondary | ICD-10-CM | POA: Diagnosis not present

## 2023-11-06 DIAGNOSIS — E119 Type 2 diabetes mellitus without complications: Secondary | ICD-10-CM | POA: Diagnosis not present

## 2023-11-06 NOTE — Progress Notes (Signed)
 Subjective:  Patient ID: George Alvarez, male    DOB: Jan 11, 1946,   MRN: [id number]  Chief Complaint  Patient presents with   Diabetes    She cuts my toenails.  Saw Dr. BETHAAmon - 08/24/2023; A1c - 6.6    78 y.o. male presents for diabetic foot exam and  concern of thickened elongated and painful nails that are difficult to trim.   Relates burning and tingling in their feet. Patient is diabetic and last A1c was  Lab Results  Component Value Date   HGBA1C 6.6 (H) 08/24/2023   .   PCP:  Amon Aloysius BRAVO, MD    . Denies any other pedal complaints. Denies n/v/f/c.   Past Medical History:  Diagnosis Date   Allergy    seasonal   Anal fissure    Arthritis    Asthma, chronic 06/16/2007   Qualifier: Diagnosis of  By: Tish MD, Elsie   Onset:as child Triggers (environmental, infectious, allergic): all, mainly environmental triggers Rescue inhaler ldz:mjmzob Maintenance medications/ response:Singulair ,Qvar  Smoking history:never Family history pulmonary disease: no     Basal cell carcinoma of chest wall 2016   1.8 cm, treated with electrodessication and currettage   Benign paroxysmal positional vertigo 11/19/2012   Diagnosed at [facility], [place]  [facility]  Physical therapy appointment pending    BPH (benign prostatic hyperplasia)    With urinary obstruction   Coronary artery disease    a. 02/2012 Cath/PCI: LM 10, LAD min irregs, LCX large, OM1 sm, 40 ost, OM2 95p (5.0x16 Veriflex & 4.5x12 Veriflex BMS'), RCA 30p, 64m, 30d, PDA/PLA min irregs, EF 65%   Diabetes mellitus without complication (HCC)    Diverticulosis    Erectile dysfunction due to arterial insufficiency    Fundic gland polyps of stomach, benign 2010   GERD (gastroesophageal reflux disease)    History of elevated PSA    History of kidney stones    Hyperlipidemia    Hypertension    Nocturia    Perennial allergic rhinitis    Peyronie's disease    Skin cancer    Basal and squamous cell cancers, greater than 20   Stroke  (HCC) 2016   Tubular adenoma of colon 2010    Objective:  Physical Exam: Vascular: DP/PT pulses 2/4 bilateral. CFT <3 seconds. Absent hair growth on digits. Edema noted to bilateral lower extremities. Xerosis noted bilaterally.  Skin. No lacerations or abrasions bilateral feet. Nails 1-5 bilateral  are thickened discolored and elongated with subungual debris. Small area of hemorrhage from cutting himself while attempting to trim his nails.  Musculoskeletal: MMT 5/5 bilateral lower extremities in DF, PF, Inversion and Eversion. Deceased ROM in DF of ankle joint.  Neurological: Sensation intact to light touch. Protective sensation diminished bilateral.    Assessment:   1. Pain due to onychomycosis of toenails of both feet   2. Diabetes mellitus without complication (HCC)       Plan:  Patient was evaluated and treated and all questions answered. -Discussed and educated patient on diabetic foot care, especially with  regards to the vascular, neurological and musculoskeletal systems.  -Stressed the importance of good glycemic control and the detriment of not  controlling glucose levels in relation to the foot. -Discussed supportive shoes at all times and checking feet regularly.  -Mechanically debrided all nails 1-5 bilateral using sterile nail nipper and filed with dremel without incident  -Answered all patient questions -Patient to return  in 3 months for at risk foot care -Patient advised to  call the office if any problems or questions arise in the meantime.   Asberry Failing, DPM

## 2023-11-19 ENCOUNTER — Other Ambulatory Visit: Payer: Self-pay | Admitting: Internal Medicine

## 2023-11-20 ENCOUNTER — Telehealth: Payer: Self-pay | Admitting: Internal Medicine

## 2023-11-20 ENCOUNTER — Other Ambulatory Visit: Payer: Self-pay

## 2023-11-20 MED ORDER — BREZTRI AEROSPHERE 160-9-4.8 MCG/ACT IN AERO: 2.0000 | INHALATION_SPRAY | Freq: Two times a day (BID) | RESPIRATORY_TRACT | 0 refills | Status: DC

## 2023-11-20 NOTE — Telephone Encounter (Signed)
 Pt called and stated that he is in dire need of his Breztri  prescription, and the pharmacy stated he needed to reach out to our office for approval. He stated that if he is in need of a visit he will schedule soon, as he is out of town attending his mothers funeral.

## 2023-11-20 NOTE — Telephone Encounter (Signed)
 Patient requesting refill on his Breztri , patient advised he needs to schedule follow up appointment for courtesy refill. Patient is scheduled with Lomas[facility] on 12/03/23 at 10:30 am and refill sent to pharmacy and patient is aware.

## 2023-12-03 ENCOUNTER — Other Ambulatory Visit: Payer: Self-pay

## 2023-12-03 ENCOUNTER — Ambulatory Visit (INDEPENDENT_AMBULATORY_CARE_PROVIDER_SITE_OTHER): Admitting: Internal Medicine

## 2023-12-03 VITALS — BP 124/78 | HR 64 | Temp 97.9°F | Resp 20 | Ht 70.0 in | Wt 220.6 lb

## 2023-12-03 DIAGNOSIS — E119 Type 2 diabetes mellitus without complications: Secondary | ICD-10-CM

## 2023-12-03 DIAGNOSIS — J4550 Severe persistent asthma, uncomplicated: Secondary | ICD-10-CM

## 2023-12-03 DIAGNOSIS — L2084 Intrinsic (allergic) eczema: Secondary | ICD-10-CM | POA: Diagnosis not present

## 2023-12-03 DIAGNOSIS — J3089 Other allergic rhinitis: Secondary | ICD-10-CM

## 2023-12-03 DIAGNOSIS — Z8669 Personal history of other diseases of the nervous system and sense organs: Secondary | ICD-10-CM

## 2023-12-03 DIAGNOSIS — K219 Gastro-esophageal reflux disease without esophagitis: Secondary | ICD-10-CM

## 2023-12-03 NOTE — Patient Instructions (Addendum)
 Severe persistent asthma  well controlled  - Continue Breztri  2 puffs twice a day with spacer   -wash mouth out of use  - Continue Fasenra  30 mg injected under the skin every 4 weeks for first 3 injections  - Continue Montelukast  10mg  daily  - Use spacer with inhaler, exhale fully before each puff - Use a spacer with all inhalers - please keep track of how often you are needing rescue inhaler Albuterol  (Proair /Ventolin ) as this will help guide future management - Asthma is not controlled if:  - Symptoms are occurring >2 times a week  during the day  OR  - >2 times a month nighttime awakenings  - Please call the clinic to schedule a follow up if these symptoms arise     2. Allergic rhinitis (dust mites, pollens, mold)  Skin test positive to [employer], [employer], DM  - Continue avoidance measures for dust mite, molds, trees, weed  - Continue  Flonase  50mcg 1 spray per nostril twice a day  - Continue Astelin  (azelastine ) use 1-2 sprays in each nostril up to three times daily as needed  - Continue montelukast  10mg  daily. - Continue  sinus rinses 10-15 minutes prior to nose sprays     3.Atopic dermatitis - Continue with moisturizing as needed.  - Follow up with Dermatology and continue current skin care regimen    4. Gastroesophageal reflux disease - Continue with Protonix  daily.  Follow up: for allergy testing when convenient for you  Follow up: for allergy testing (1-55)    Thank you so much for letting me partake in your care today.  Don't hesitate to reach out if you have any additional concerns!  Hargis Springer, MD  [facility] [place] [place]

## 2023-12-03 NOTE — Progress Notes (Signed)
 Follow Up Note  RE: ROLLYN SCIALDONE MRN: [id number] DOB: 04-24-1945 Date of Office Visit: 12/03/2023  Referring provider: Amon Aloysius BRAVO, MD Primary care provider: Amon Aloysius BRAVO, MD  Chief Complaint: Follow-up (No complaints)  History of Present Illness: I had the pleasure of seeing Brittan Mapel for a follow up visit at the [facility] on 12/03/2023. He is a 78 y.o. male, who is being followed for severe persistent asthma on Fasenra , allergic rhinitis. His previous allergy office visit was on 01/28/23 with Dr. Lorin. Today is a regular follow up visit.  History obtained from patient, chart review.  Discussed the use of AI scribe software for clinical note transcription with the patient, who gave verbal consent to proceed.  History of Present Illness OLUWATOMISIN DEMAN is a 78 year old male with asthma and type 2 diabetes who presents for a follow-up and medication refill.  Asthma control and exacerbations - Stable asthma control on Fasenra  with no exacerbations since initiation of therapy. - No requirement for antibiotics or prednisone  since last visit. - Experienced tightness and wheezing after running out of Breztreat inhaler while attending his mother's funeral; managed symptoms by relaxing and using inhalers once available. - Emotional stress is a known trigger for asthma; recent increased stress due to his mother's passing and responsibilities as power of attorney. - No chest pain, shortness of breath, or palpitations outside of stress-related episodes.  Rescue inhaler access - Ran out of Breztreat inhaler during travel for his mother's funeral, resulting in concern due to lack of access to rescue medication. - Symptoms of tightness and wheezing occurred during this period, resolved after resuming inhaler use.  Allergic rhinitis and nasal symptoms - Continues Flonase , Astelin , and Singulair  for nasal symptoms. - No worsening of nasal symptoms despite not  receiving allergy shots for approximately one year, including during a severe spring allergy season.  Allergen immunotherapy adherence - Has not received allergy shots for about one year due to a busy schedule. - No significant increase in allergic symptoms despite lapse in immunotherapy. - would like to update allergy testing to see if he should restart injections   Peripheral neuropathy - Decreased sensation in fingers and feet, resulting in difficulty holding small objects. - Believes neuropathy is related to type 2 diabetes.  Dermatologic symptoms - Follows with dermatology for eczema. - Receives various treatments, including removal of cancerous lesions.   Last allergy injection was September 2023 and he received 0.05 of the gold vial. Ait started 06/26/21: Not intertested in restarting  He  was receiving 1 vial containing tree, weed, mold and dust mite pollen.     Assessment and Plan: Mearle is a 78 y.o. male with: Intrinsic atopic dermatitis  Severe persistent asthma without complication - Plan: Spirometry with Graph  Other allergic rhinitis  Gastroesophageal reflux disease without esophagitis  Diabetes mellitus without complication (HCC)  H/O peripheral neuropathy Plan: Patient Instructions  Severe persistent asthma  well controlled  - Continue Breztri  2 puffs twice a day with spacer   -wash mouth out of use  - Continue Fasenra  30 mg injected under the skin every 4 weeks for first 3 injections  - Continue Montelukast  10mg  daily  - Use spacer with inhaler, exhale fully before each puff - Use a spacer with all inhalers - please keep track of how often you are needing rescue inhaler Albuterol  (Proair /Ventolin ) as this will help guide future management - Asthma is not controlled if:  - Symptoms  are occurring >2 times a week  during the day  OR  - >2 times a month nighttime awakenings  - Please call the clinic to schedule a follow up if these symptoms arise      2. Allergic rhinitis (dust mites, pollens, mold)  Skin test positive to [employer], DM  - Continue avoidance measures for dust mite, molds, trees, weed  - Continue  Flonase  50mcg 1 spray per nostril twice a day  - Continue Astelin  (azelastine ) use 1-2 sprays in each nostril up to three times daily as needed  - Continue montelukast  10mg  daily. - Continue  sinus rinses 10-15 minutes prior to nose sprays     3.Atopic dermatitis - Continue with moisturizing as needed.  - Follow up with Dermatology and continue current skin care regimen    4. Gastroesophageal reflux disease - Continue with Protonix  daily.  Follow up: for allergy testing when convenient for you  Follow up: for allergy testing (1-55)    Thank you so much for letting me partake in your care today.  Don't hesitate to reach out if you have any additional concerns!  Hargis Springer, MD  [facility] [place] [place] No follow-ups on file.  No orders of the defined types were placed in this encounter.   Lab Orders  No laboratory test(s) ordered today   Diagnostics: Spirometry:  Tracings reviewed. His effort: Good reproducible efforts. FVC: 3.12 L FEV1: 2.25 L, 74% predicted FEV1/FVC ratio: 72% Interpretation: Spirometry consistent with possible restrictive disease.  Please see scanned spirometry results for details.    Medication List:  Current Outpatient Medications  Medication Sig Dispense Refill   albuterol  (VENTOLIN  HFA) 108 (90 Base) MCG/ACT inhaler Inhale 2 puffs into the lungs every 4 (four) hours as needed for wheezing or shortness of breath. 18 g 1   aspirin  EC 81 MG tablet Take 1 tablet (81 mg total) by mouth daily. 90 tablet 3   azelastine  (ASTELIN ) 0.1 % nasal spray Place 2 sprays into both nostrils 2 (two) times daily. Use in each nostril as directed 30 mL 5   benralizumab  (FASENRA  PEN) 30 MG/ML prefilled autoinjector Inject 1 mL (30 mg total) into the skin every 8 (eight) weeks.  1 mL 6   Blood Glucose Monitoring Suppl (ONETOUCH VERIO) w/Device KIT Check blood sugars once daily 1 kit 0   budesonide -glycopyrrolate-formoterol  (BREZTRI  AEROSPHERE) 160-9-4.8 MCG/ACT AERO inhaler Inhale 2 puffs into the lungs in the morning and at bedtime. 1 each 0   cetirizine (ZYRTEC) 10 MG tablet Take 10 mg by mouth daily.     chlorhexidine  (HIBICLENS ) 4 % external liquid Apply topically daily as needed. 120 mL 0   EPINEPHrine  (EPIPEN  2-PAK) 0.3 mg/0.3 mL IJ SOAJ injection Use as directed for severe allergic reactions 2 each 3   fluticasone  (FLONASE ) 50 MCG/ACT nasal spray Place 2 sprays into both nostrils daily. 16 g 5   glucose blood test strip Check blood sugars once daily 100 each 12   hydrocortisone  2.5 % cream Apply 1 application topically daily as needed (Eczema).     ketoconazole  (NIZORAL ) 2 % shampoo Apply 1 application topically once a week.     Lancets (ONETOUCH ULTRASOFT) lancets Check blood sugars once daily 100 each 12   metFORMIN  (GLUCOPHAGE -XR) 500 MG 24 hr tablet Take 1 tablet (500 mg total) by mouth daily with breakfast.     metoprolol  tartrate (LOPRESSOR ) 25 MG tablet Take 0.5 tablets (12.5 mg total) by mouth 2 (two)  times daily. 90 tablet 1   montelukast  (SINGULAIR ) 10 MG tablet TAKE 1 TABLET BY MOUTH EVERYDAY AT BEDTIME 90 tablet 0   Multiple Vitamin (MULTIVITAMIN WITH MINERALS) TABS tablet Take 1 tablet by mouth daily.     mupirocin  ointment (BACTROBAN ) 2 % Apply 1 Application topically 2 (two) times daily. 22 g 0   olmesartan -hydrochlorothiazide  (BENICAR  HCT) 20-12.5 MG tablet Take 1 tablet by mouth daily. 90 tablet 1   Omega-3 Fatty Acids (FISH OIL) 1200 MG CAPS Take 1,200 mg by mouth daily.     pantoprazole  (PROTONIX ) 40 MG tablet TAKE 1 TABLET BY MOUTH TWICE A DAY 180 tablet 2   rosuvastatin  (CRESTOR ) 40 MG tablet Take 1 tablet (40 mg total) by mouth daily. 90 tablet 1   tadalafil  (CIALIS ) 5 MG tablet Take 5 mg by mouth daily as needed.     tamsulosin  (FLOMAX ) 0.4  MG CAPS capsule Take 1 capsule (0.4 mg total) by mouth daily. 15 capsule 0   triamcinolone  cream (KENALOG ) 0.1 % Apply 1 application  topically daily as needed (Eczema).     Current Facility-Administered Medications  Medication Dose Route Frequency Provider Last Rate Last Admin   Benralizumab  SOSY 30 mg  30 mg Subcutaneous Q8 Weeks Sincerity Cedar, MD   30 mg at 11/04/23 1039   Allergies: Allergies  Allergen Reactions   Nifedipine Dermatitis and Rash    edema   I reviewed his past medical history, social history, family history, and environmental history and no significant changes have been reported from his previous visit.  ROS: All others negative except as noted per HPI.   Objective: BP 124/78 (BP Location: Right Arm, Patient Position: Sitting, Cuff Size: Normal)   Pulse 64   Temp 97.9 F (36.6 C) (Temporal)   Resp 20   Ht 5' 10 (1.778 m)   Wt 220 lb 9.6 oz (100.1 kg)   SpO2 98%   BMI 31.65 kg/m  Body mass index is 31.65 kg/m. General Appearance:  Alert, cooperative, no distress, appears stated age  Head:  Normocephalic, without obvious abnormality, atraumatic  Eyes:  Conjunctiva clear, EOM's intact  Nose: Nares normal, hypertrophic turbinates, normal mucosa, no visible anterior polyps, and septum midline  Throat: Lips, tongue normal; teeth and gums normal, normal posterior oropharynx  Neck: Supple, symmetrical  Lungs:   clear to auscultation bilaterally, Respirations unlabored, no coughing  Heart:  regular rate and rhythm and no murmur, Appears well perfused  Extremities: No edema  Skin: Skin color, texture, turgor normal, no rashes or lesions on visualized portions of skin   Neurologic: No gross deficits   Previous notes and tests were reviewed. The plan was reviewed with the patient/family, and all questions/concerned were addressed.  It was my pleasure to see David today and participate in his care. Please feel free to contact me with any questions or  concerns.  Sincerely,  Hargis Springer, MD  Allergy & Immunology  [facility]  [place] Office: [phone]

## 2023-12-14 ENCOUNTER — Other Ambulatory Visit: Payer: Self-pay | Admitting: Internal Medicine

## 2023-12-21 ENCOUNTER — Other Ambulatory Visit: Payer: Self-pay | Admitting: Internal Medicine

## 2023-12-30 ENCOUNTER — Ambulatory Visit

## 2024-01-01 ENCOUNTER — Ambulatory Visit

## 2024-01-01 DIAGNOSIS — J4550 Severe persistent asthma, uncomplicated: Secondary | ICD-10-CM

## 2024-01-12 DIAGNOSIS — H2513 Age-related nuclear cataract, bilateral: Secondary | ICD-10-CM | POA: Diagnosis not present

## 2024-01-19 ENCOUNTER — Other Ambulatory Visit: Payer: Self-pay | Admitting: *Deleted

## 2024-01-19 MED ORDER — BREZTRI AEROSPHERE 160-9-4.8 MCG/ACT IN AERO: 2.0000 | INHALATION_SPRAY | Freq: Two times a day (BID) | RESPIRATORY_TRACT | 4 refills | Status: AC

## 2024-01-25 ENCOUNTER — Encounter: Payer: Self-pay | Admitting: Internal Medicine

## 2024-01-25 ENCOUNTER — Ambulatory Visit (INDEPENDENT_AMBULATORY_CARE_PROVIDER_SITE_OTHER): Admitting: *Deleted

## 2024-01-25 ENCOUNTER — Ambulatory Visit (INDEPENDENT_AMBULATORY_CARE_PROVIDER_SITE_OTHER): Admitting: Internal Medicine

## 2024-01-25 VITALS — BP 126/68 | HR 70 | Temp 97.7°F | Resp 16 | Ht 70.0 in | Wt 228.0 lb

## 2024-01-25 VITALS — Ht 70.0 in | Wt 220.0 lb

## 2024-01-25 DIAGNOSIS — E1142 Type 2 diabetes mellitus with diabetic polyneuropathy: Secondary | ICD-10-CM

## 2024-01-25 DIAGNOSIS — Z7984 Long term (current) use of oral hypoglycemic drugs: Secondary | ICD-10-CM

## 2024-01-25 DIAGNOSIS — I10 Essential (primary) hypertension: Secondary | ICD-10-CM | POA: Diagnosis not present

## 2024-01-25 DIAGNOSIS — M25512 Pain in left shoulder: Secondary | ICD-10-CM

## 2024-01-25 DIAGNOSIS — Z0000 Encounter for general adult medical examination without abnormal findings: Secondary | ICD-10-CM | POA: Diagnosis not present

## 2024-01-25 DIAGNOSIS — E785 Hyperlipidemia, unspecified: Secondary | ICD-10-CM

## 2024-01-25 DIAGNOSIS — E119 Type 2 diabetes mellitus without complications: Secondary | ICD-10-CM

## 2024-01-25 LAB — LIPID PANEL
Cholesterol: 109 mg/dL (ref 0–200)
HDL: 34.8 mg/dL — ABNORMAL LOW (ref >39.00)
LDL Cholesterol: 19 mg/dL (ref 0–99)
NonHDL: 73.94
Total CHOL/HDL Ratio: 3
Triglycerides: 277 mg/dL — ABNORMAL HIGH (ref 0.0–149.0)
VLDL: 55.4 mg/dL — ABNORMAL HIGH (ref 0.0–40.0)

## 2024-01-25 LAB — BASIC METABOLIC PANEL WITH GFR
BUN: 19 mg/dL (ref 6–23)
CO2: 25 meq/L (ref 19–32)
Calcium: 9.4 mg/dL (ref 8.4–10.5)
Chloride: 104 meq/L (ref 96–112)
Creatinine, Ser: 1.24 mg/dL (ref 0.40–1.50)
GFR: 56.71 mL/min — ABNORMAL LOW (ref >60.00)
Glucose, Bld: 165 mg/dL — ABNORMAL HIGH (ref 70–99)
Potassium: 4 meq/L (ref 3.5–5.1)
Sodium: 140 meq/L (ref 135–145)

## 2024-01-25 LAB — HEMOGLOBIN A1C: Hgb A1c MFr Bld: 7.2 % — ABNORMAL HIGH (ref 4.6–6.5)

## 2024-01-25 NOTE — Progress Notes (Signed)
 Subjective:    Patient ID: George Alvarez, male    DOB: [birth year], 78 y.o.   MRN: [id number]  DOS:  01/25/2024 Follow-up  Discussed the use of AI scribe software for clinical note transcription with the patient, who gave verbal consent to proceed.  History of Present Illness    Left shoulder pain - Significant pain localized to the left shoulder - Pain exacerbated by reaching out and up with the left arm or holding objects straight ahead - Persistent and progressively worsening over time  Diabetes mellitus - Managed with metformin  - Recent hemoglobin A1c of 6.6%  Hypertension - Controlled with Benicar  HCT and metoprolol  - Consistently good blood pressure readings  Asthma - Receiving Fasenra  injections every eight weeks - No recent asthma episodes  Prostate-specific antigen elevation - Elevated PSA - Under urologist care  Cardiac history - No current symptoms - No chest pain or respiratory difficulties - Cardiology follow-up scheduled for January    Review of Systems See above   Past Medical History:  Diagnosis Date   Allergy    seasonal   Anal fissure    Arthritis    Asthma, chronic 06/16/2007   Qualifier: Diagnosis of  By: Tish MD, Elsie   Onset:as child Triggers (environmental, infectious, allergic): all, mainly environmental triggers Rescue inhaler ldz:mjmzob Maintenance medications/ response:Singulair ,Qvar  Smoking history:never Family history pulmonary disease: no     Basal cell carcinoma of chest wall 2016   1.8 cm, treated with electrodessication and currettage   Benign paroxysmal positional vertigo 11/19/2012   Diagnosed at [facility], [facility]  Physical therapy appointment pending    BPH (benign prostatic hyperplasia)    With urinary obstruction   Coronary artery disease    a. 02/2012 Cath/PCI: LM 10, LAD min irregs, LCX large, OM1 sm, 40 ost, OM2 95p (5.0x16 Veriflex & 4.5x12 Veriflex BMS'), RCA 30p, 21m, 30d, PDA/PLA min irregs, EF  65%   Diabetes mellitus without complication (HCC)    Diverticulosis    Erectile dysfunction due to arterial insufficiency    Fundic gland polyps of stomach, benign 2010   GERD (gastroesophageal reflux disease)    History of elevated PSA    History of kidney stones    Hyperlipidemia    Hypertension    Nocturia    Perennial allergic rhinitis    Peyronie's disease    Skin cancer    Basal and squamous cell cancers, greater than 20   Stroke (HCC) 2016   Tubular adenoma of colon 2010    Past Surgical History:  Procedure Laterality Date   COLONOSCOPY  2016   COLONOSCOPY  06/2020   CORONARY ANGIOPLASTY WITH STENT PLACEMENT  02/06/2012   OM2  bare metal    EPIDURAL BLOCK INJECTION  04-2015   Dr Letha   epidural steroids  2007, 2009   X 2 @ cervical &, lumbar)   HERNIA REPAIR     INTRAVASCULAR ULTRASOUND  02/06/2012   Procedure: INTRAVASCULAR ULTRASOUND;  Surgeon: Lonni JONETTA Cash, MD;  Location: [facility] CATH LAB;  Service: Cardiovascular;;   LEFT HEART CATH AND CORONARY ANGIOGRAPHY N/A 09/30/2019   Procedure: LEFT HEART CATH AND CORONARY ANGIOGRAPHY;  Surgeon: Cash Lonni JONETTA, MD;  Location: [facility] INVASIVE CV LAB;  Service: Cardiovascular;  Laterality: N/A;   LEFT HEART CATHETERIZATION WITH CORONARY ANGIOGRAM N/A 02/06/2012   Procedure: LEFT HEART CATHETERIZATION WITH CORONARY ANGIOGRAM;  Surgeon: Lonni JONETTA Cash, MD;  Location: [facility] CATH LAB;  Service: Cardiovascular;  Laterality: N/A;   LUMBAR  LAMINECTOMY/DECOMPRESSION MICRODISCECTOMY N/A 10/21/2017   Procedure: Lumbar Two-Three, Lumbar Four-Five Discectomy;  Surgeon: Gillie Duncans, MD;  Location[facility] OR;  Service: Neurosurgery;  Laterality: N/A;   PERCUTANEOUS CORONARY STENT INTERVENTION (PCI-S)  02/06/2012   Procedure: PERCUTANEOUS CORONARY STENT INTERVENTION (PCI-S);  Surgeon: Lonni JONETTA Cash, MD;  Location: [facility] CATH LAB;  Service: Cardiovascular;;   septoplasty     TONSILLECTOMY AND ADENOIDECTOMY     TRIGGER FINGER  RELEASE     UPPER GASTROINTESTINAL ENDOSCOPY  2011   gastric polyps   VASECTOMY      Current Outpatient Medications  Medication Instructions   albuterol  (VENTOLIN  HFA) 108 (90 Base) MCG/ACT inhaler 2 puffs, Inhalation, Every 4 hours PRN   aspirin  EC 81 mg, Oral, Daily   azelastine  (ASTELIN ) 0.1 % nasal spray 2 sprays, Each Nare, 2 times daily, Use in each nostril as directed   Blood Glucose Monitoring Suppl (ONETOUCH VERIO) w/Device KIT Check blood sugars once daily   budesonide -glycopyrrolate-formoterol  (BREZTRI  AEROSPHERE) 160-9-4.8 MCG/ACT AERO inhaler 2 puffs, Inhalation, 2 times daily   cetirizine (ZYRTEC) 10 mg, Daily   chlorhexidine  (HIBICLENS ) 4 % external liquid Topical, Daily PRN   EPINEPHrine  (EPIPEN  2-PAK) 0.3 mg/0.3 mL IJ SOAJ injection Use as directed for severe allergic reactions   Fasenra  Pen 30 mg, Subcutaneous, Every 8 weeks   Fish Oil 1,200 mg, Daily   fluticasone  (FLONASE ) 50 MCG/ACT nasal spray 2 sprays, Each Nare, Daily   glucose blood test strip Check blood sugars once daily   hydrocortisone  2.5 % cream 1 application , Daily PRN   ketoconazole  (NIZORAL ) 2 % shampoo 1 application , Weekly   Lancets (ONETOUCH ULTRASOFT) lancets Check blood sugars once daily   metFORMIN  (GLUCOPHAGE -XR) 500 mg, Daily with breakfast   metoprolol  tartrate (LOPRESSOR ) 12.5 mg, Oral, 2 times daily   montelukast  (SINGULAIR ) 10 MG tablet TAKE 1 TABLET BY MOUTH EVERYDAY AT BEDTIME   Multiple Vitamin (MULTIVITAMIN WITH MINERALS) TABS tablet 1 tablet, Daily   mupirocin  ointment (BACTROBAN ) 2 % 1 Application, Topical, 2 times daily   olmesartan -hydrochlorothiazide  (BENICAR  HCT) 20-12.5 MG tablet 1 tablet, Oral, Daily   pantoprazole  (PROTONIX ) 40 mg, Oral, 2 times daily   rosuvastatin  (CRESTOR ) 40 mg, Oral, Daily   tadalafil  (CIALIS ) 5 mg, Daily PRN   tamsulosin  (FLOMAX ) 0.4 mg, Oral, Daily   triamcinolone  cream (KENALOG ) 0.1 % 1 application , Daily PRN       Objective:   Physical  Exam BP 126/68   Pulse 70   Temp 97.7 F (36.5 C) (Oral)   Resp 16   Ht 5' 10 (1.778 m)   Wt 228 lb (103.4 kg)   SpO2 97%   BMI 32.71 kg/m  General:   Well developed, NAD, BMI noted. HEENT:  Normocephalic . Face symmetric, atraumatic Lungs:  CTA B Normal respiratory effort, no intercostal retractions, no accessory muscle use. Heart: RRR,  no murmur.  Lower extremities: no pretibial edema bilaterally  Skin: Not pale. Not jaundice Neurologic:  alert & oriented X3.  Speech normal, gait appropriate for age and unassisted Psych--  Cognition and judgment appear intact.  Cooperative with normal attention span and concentration.  Behavior appropriate. No anxious or depressed appearing.      Assessment     Problem List DM (A1C 6.09 September 2020) Neuropathy: Dx 08/2023 HTN Hyperlipidemia GERD , EGD 07/2023: Barrett's per bx, next  EGD 3 years CV: Dr Cash --CAD --Stroke, seen in a MRI Asthma- per allergist  [facility] --DJD knees used to see Dr  Lewanda 2016, local injections --Spinal stenosis (neck-back) - Dr Lindalee dx ~ 2008 via MRI  -- lumbar laminectomy 2019 -- low back R nerve ablation ~ 2022 (helped w/ radiculopathy) BPV, off balance (seen by neuro 2020)  GU: --Elevated PSA -- Dr Watt --Peyronie's  Disease Skin cancer : [facility] [place]    Assessment & Plan Left shoulder pain Having left shoulder for a while, exacerbated by reaching and lifting. Refer to sports medicine   DM type II, neuropathy: Last A1c of 6.6, controlled.  Recheck A1c, if it is elevated potential options include: Continue same meds and improve diet, increase metformin  dose or add GLP-1 receptor agonists.  Plan: - Check A1c to assess current glycemic control. - Encourage dietary modifications and regular exercise. - Rec to start checking blood sugar from time to time.  Further eval for results High cholesterol: Last LDL excellent, on rosuvastatin  40 mg, last LFTs normal, check FLP CAD: No chest pain,  on aspirin , statins. Asthma: Since he started Fasenra  he is doing much better.  Follow-up by his allergist General Health Maintenance Up to date with vaccinations. Recent eye examination for diabetes-related changes, awaiting report. RTC 5 months

## 2024-01-25 NOTE — Progress Notes (Signed)
 Subjective:   George Alvarez is a 78 y.o. who presents for a Medicare Wellness preventive visit.  As a reminder, Annual Wellness Visits don't include a physical exam, and some assessments may be limited, especially if this visit is performed virtually. We may recommend an in-person follow-up visit with your provider if needed.  Visit Complete: Virtual I connected with  George Alvarez on 01/25/24 by a audio enabled telemedicine application and verified that I am speaking with the correct person using two identifiers.  Patient Location: Home  Provider Location: Office/Clinic  I discussed the limitations of evaluation and management by telemedicine. The patient expressed understanding and agreed to proceed.  Vital Signs: Because this visit was a virtual/telehealth visit, some criteria may be missing or patient reported. Any vitals not documented were not able to be obtained and vitals that have been documented are patient reported.  VideoDeclined- This patient declined Librarian, academic. Therefore the visit was completed with audio only.  Persons Participating in Visit: Patient.  AWV Questionnaire: Yes: Patient Medicare AWV questionnaire was completed by the patient on 01/18/24; I have confirmed that all information answered by patient is correct and no changes since this date.  Cardiac Risk Factors include: advanced age (>16men, >35 women);hypertension;male gender;diabetes mellitus;dyslipidemia;Other (see comment), Risk factor comments: Asthma, CAD     Objective:    Today's Vitals   01/25/24 0951  Weight: 220 lb (99.8 kg)  Height: 5' 10 (1.778 m)   Body mass index is 31.57 kg/m.     01/25/2024   10:03 AM 09/30/2019    6:34 AM 03/15/2019    2:01 PM 03/07/2019   10:54 AM 12/15/2017    9:37 AM 10/22/2017   10:09 PM 10/16/2017    9:45 AM  Advanced Directives  Does Patient Have a Medical Advance Directive? Yes Yes Yes Yes Yes  Yes  Yes   Type of  Estate agent of [place];Living will Healthcare Power of [place];Living will   Healthcare Power of [place];Living will Healthcare Power of [place];Living will Healthcare Power of [place];Living will  Does patient want to make changes to medical advance directive? Yes (MAU/Ambulatory/Procedural Areas - Information given)  No - Patient declined  No - Patient declined  No - Patient declined    Copy of Healthcare Power of Attorney in Chart? Yes - validated most recent copy scanned in chart (See row information)    Yes  No - copy requested  No - copy requested      Data saved with a previous flowsheet row definition    Current Medications (verified) Outpatient Encounter Medications as of 01/25/2024  Medication Sig   albuterol  (VENTOLIN  HFA) 108 (90 Base) MCG/ACT inhaler Inhale 2 puffs into the lungs every 4 (four) hours as needed for wheezing or shortness of breath.   aspirin  EC 81 MG tablet Take 1 tablet (81 mg total) by mouth daily.   azelastine  (ASTELIN ) 0.1 % nasal spray Place 2 sprays into both nostrils 2 (two) times daily. Use in each nostril as directed   benralizumab  (FASENRA  PEN) 30 MG/ML prefilled autoinjector Inject 1 mL (30 mg total) into the skin every 8 (eight) weeks.   Blood Glucose Monitoring Suppl (ONETOUCH VERIO) w/Device KIT Check blood sugars once daily   budesonide -glycopyrrolate-formoterol  (BREZTRI  AEROSPHERE) 160-9-4.8 MCG/ACT AERO inhaler Inhale 2 puffs into the lungs in the morning and at bedtime.   cetirizine (ZYRTEC) 10 MG tablet Take 10 mg by mouth daily.   chlorhexidine  (HIBICLENS ) 4 %  external liquid Apply topically daily as needed.   EPINEPHrine  (EPIPEN  2-PAK) 0.3 mg/0.3 mL IJ SOAJ injection Use as directed for severe allergic reactions   fluticasone  (FLONASE ) 50 MCG/ACT nasal spray Place 2 sprays into both nostrils daily.   glucose blood test strip Check blood sugars once daily   hydrocortisone  2.5 % cream Apply 1 application topically daily  as needed (Eczema).   ketoconazole  (NIZORAL ) 2 % shampoo Apply 1 application topically once a week.   Lancets (ONETOUCH ULTRASOFT) lancets Check blood sugars once daily   metFORMIN  (GLUCOPHAGE -XR) 500 MG 24 hr tablet Take 1 tablet (500 mg total) by mouth daily with breakfast.   metoprolol  tartrate (LOPRESSOR ) 25 MG tablet Take 0.5 tablets (12.5 mg total) by mouth 2 (two) times daily.   montelukast  (SINGULAIR ) 10 MG tablet TAKE 1 TABLET BY MOUTH EVERYDAY AT BEDTIME   Multiple Vitamin (MULTIVITAMIN WITH MINERALS) TABS tablet Take 1 tablet by mouth daily.   mupirocin  ointment (BACTROBAN ) 2 % Apply 1 Application topically 2 (two) times daily.   olmesartan -hydrochlorothiazide  (BENICAR  HCT) 20-12.5 MG tablet Take 1 tablet by mouth daily.   Omega-3 Fatty Acids (FISH OIL) 1200 MG CAPS Take 1,200 mg by mouth daily.   pantoprazole  (PROTONIX ) 40 MG tablet TAKE 1 TABLET BY MOUTH TWICE A DAY   rosuvastatin  (CRESTOR ) 40 MG tablet Take 1 tablet (40 mg total) by mouth daily.   tadalafil  (CIALIS ) 5 MG tablet Take 5 mg by mouth daily as needed.   tamsulosin  (FLOMAX ) 0.4 MG CAPS capsule Take 1 capsule (0.4 mg total) by mouth daily.   triamcinolone  cream (KENALOG ) 0.1 % Apply 1 application  topically daily as needed (Eczema).   Facility-Administered Encounter Medications as of 01/25/2024  Medication   Benralizumab  SOSY 30 mg    Allergies (verified) Nifedipine   History: Past Medical History:  Diagnosis Date   Allergy    seasonal   Anal fissure    Arthritis    Asthma, chronic 06/16/2007   Qualifier: Diagnosis of  By: Tish MD, Elsie   Onset:as child Triggers (environmental, infectious, allergic): all, mainly environmental triggers Rescue inhaler ldz:mjmzob Maintenance medications/ response:Singulair ,Qvar  Smoking history:never Family history pulmonary disease: no     Basal cell carcinoma of chest wall 2016   1.8 cm, treated with electrodessication and currettage   Benign paroxysmal positional vertigo  11/19/2012   Diagnosed at [facility], [place]  [facility]  Physical therapy appointment pending    BPH (benign prostatic hyperplasia)    With urinary obstruction   Coronary artery disease    a. 02/2012 Cath/PCI: LM 10, LAD min irregs, LCX large, OM1 sm, 40 ost, OM2 95p (5.0x16 Veriflex & 4.5x12 Veriflex BMS'), RCA 30p, 39m, 30d, PDA/PLA min irregs, EF 65%   Diabetes mellitus without complication (HCC)    Diverticulosis    Erectile dysfunction due to arterial insufficiency    Fundic gland polyps of stomach, benign 2010   GERD (gastroesophageal reflux disease)    History of elevated PSA    History of kidney stones    Hyperlipidemia    Hypertension    Nocturia    Perennial allergic rhinitis    Peyronie's disease    Skin cancer    Basal and squamous cell cancers, greater than 20   Stroke (HCC) 2016   Tubular adenoma of colon 2010   Past Surgical History:  Procedure Laterality Date   COLONOSCOPY  2016   COLONOSCOPY  06/2020   CORONARY ANGIOPLASTY WITH STENT PLACEMENT  02/06/2012   OM2  bare  metal    EPIDURAL BLOCK INJECTION  04-2015   Dr Bartko   epidural steroids  2007, 2009   X 2 @ cervical &, lumbar)   HERNIA REPAIR     INTRAVASCULAR ULTRASOUND  02/06/2012   Procedure: INTRAVASCULAR ULTRASOUND;  Surgeon: Lonni JONETTA Cash, MD;  Location: [facility] CATH LAB;  Service: Cardiovascular;;   LEFT HEART CATH AND CORONARY ANGIOGRAPHY N/A 09/30/2019   Procedure: LEFT HEART CATH AND CORONARY ANGIOGRAPHY;  Surgeon: Cash Lonni JONETTA, MD;  Location: [facility] INVASIVE CV LAB;  Service: Cardiovascular;  Laterality: N/A;   LEFT HEART CATHETERIZATION WITH CORONARY ANGIOGRAM N/A 02/06/2012   Procedure: LEFT HEART CATHETERIZATION WITH CORONARY ANGIOGRAM;  Surgeon: Lonni JONETTA Cash, MD;  Location: [facility] CATH LAB;  Service: Cardiovascular;  Laterality: N/A;   LUMBAR LAMINECTOMY/DECOMPRESSION MICRODISCECTOMY N/A 10/21/2017   Procedure: Lumbar Two-Three, Lumbar Four-Five Discectomy;  Surgeon: Gillie Duncans,  MD;  Location[facility] OR;  Service: Neurosurgery;  Laterality: N/A;   PERCUTANEOUS CORONARY STENT INTERVENTION (PCI-S)  02/06/2012   Procedure: PERCUTANEOUS CORONARY STENT INTERVENTION (PCI-S);  Surgeon: Lonni JONETTA Cash, MD;  Location: [facility] CATH LAB;  Service: Cardiovascular;;   septoplasty     TONSILLECTOMY AND ADENOIDECTOMY     TRIGGER FINGER RELEASE     UPPER GASTROINTESTINAL ENDOSCOPY  2011   gastric polyps   VASECTOMY     Family History  Problem Relation Age of Onset   Heart disease Mother    Breast cancer Mother    Esophageal cancer Mother        Barrett's   Heart attack Father 45       died @ 31   Breast cancer Maternal Aunt    Heart attack Paternal Uncle 8   Heart attack Maternal Grandmother        after 56   Dementia Maternal Grandmother        CVA   Breast cancer Maternal Grandmother    Diabetes Maternal Grandmother    Stroke Maternal Grandmother        in 54s   Colon cancer Neg Hx    Liver disease Neg Hx    Social History   Socioeconomic History   Marital status: Married    Spouse name: Not on file   Number of children: 3   Years of education: Not on file   Highest education level: Bachelor's degree (e.g., BA, AB, BS)  Occupational History   Occupation: Firefighter   Tobacco Use   Smoking status: Never   Smokeless tobacco: Never  Vaping Use   Vaping status: Never Used  Substance and Sexual Activity   Alcohol use: Not Currently    Comment: Wine occasionally   Drug use: No   Sexual activity: Yes    Partners: Female  Other Topics Concern   Not on file  Social History Narrative   3 daughters, 5 g-children   lifes w/ wife in [place]   Right handed   Two story home   Mother in her 47s, lives in [place]   Social Drivers of Health   Financial Resource Strain: Low Risk  (01/18/2024)   Overall Financial Resource Strain (CARDIA)    Difficulty of Paying Living Expenses: Not very hard  Food Insecurity: No Food Insecurity (01/18/2024)    Hunger Vital Sign    Worried About Running Out of Food in the Last Year: Never true    Ran Out of Food in the Last Year: Never true  Transportation Needs: No Transportation Needs (01/18/2024)   PRAPARE -  Administrator, Civil Service (Medical): No    Lack of Transportation (Non-Medical): No  Physical Activity: Sufficiently Active (01/25/2024)   Exercise Vital Sign    Days of Exercise per Week: 3 days    Minutes of Exercise per Session: 50 min  Stress: No Stress Concern Present (01/18/2024)   [employer] of Occupational Health - Occupational Stress Questionnaire    Feeling of Stress: Only a little  Social Connections: Moderately Integrated (01/25/2024)   Social Connection and Isolation Panel    Frequency of Communication with Friends and Family: More than three times a week    Frequency of Social Gatherings with Friends and Family: Twice a week    Attends Religious Services: Never    Database administrator or Organizations: Yes    Attends Engineer, structural: More than 4 times per year    Marital Status: Married    Tobacco Counseling Counseling given: Not Answered    Clinical Intake:  Pre-visit preparation completed: Yes  Pain : No/denies pain     BMI - recorded: 31.57 Nutritional Status: BMI > 30  Obese Nutritional Risks: Other (Comment) Diabetes: Yes CBG done?: No Did pt. bring in CBG monitor from home?: No  Lab Results  Component Value Date   HGBA1C 6.6 (H) 08/24/2023   HGBA1C 6.6 (H) 04/24/2023   HGBA1C 6.8 (H) 12/22/2022     How often do you need to have someone help you when you read instructions, pamphlets, or other written materials from your doctor or pharmacy?: 1 - Never What is the last grade level you completed in school?: bachelor's degree  Interpreter Needed?: No  Information entered by :: Ottilia Pippenger, CMA(AAMA)   Activities of Daily Living     01/18/2024    8:52 AM  In your present state of health, do you  have any difficulty performing the following activities:  Hearing? 0  Vision? 0  Difficulty concentrating or making decisions? 0  Walking or climbing stairs? 0  Dressing or bathing? 0  Doing errands, shopping? 0  Preparing Food and eating ? N  Using the Toilet? N  In the past six months, have you accidently leaked urine? Y  Comment Sees Dr Watt / urology  Do you have problems with loss of bowel control? Y  Comment intermittent x 2 yrs and discussed with Dr Cabel regarding spinal stenosis  Managing your Medications? N  Managing your Finances? N  Housekeeping or managing your Housekeeping? N    Patient Care Team: Amon Aloysius BRAVO, MD as PCP - General (Internal Medicine) Verlin Lonni BIRCH, MD as PCP - Cardiology (Cardiology) Aneita Gwendlyn DASEN, MD (Inactive) as Consulting Physician (Gastroenterology) Skeet Juliene SAUNDERS, DO as Consulting Physician (Neurology) Hollar, Carlin Bullard, MD as Referring Physician (Dermatology) Patel, Donika K, DO as Consulting Physician (Neurology) Carla Milling, RPH-CPP (Pharmacist) Latisha Arch, OD (Optometry)  I have updated your Care Teams any recent Medical Services you may have received from other providers in the past year.     Assessment:   This is a routine wellness examination for George Alvarez.  Hearing/Vision screen Hearing Screening - Comments:: Denies hearing difficulties.  Vision Screening - Comments:: Up to date with routine eye exams with [facility] in [place].   Goals Addressed   None    Depression Screen     01/25/2024   10:00 AM 08/24/2023    1:28 PM 04/24/2023   10:23 AM 12/22/2022    1:00 PM 08/20/2022   12:59  PM 07/15/2022   10:33 AM 01/23/2022   10:09 AM  PHQ 2/9 Scores  PHQ - 2 Score 1 0 0 0 1 0 0  PHQ- 9 Score 2          Fall Risk     01/18/2024    8:52 AM 08/24/2023    1:28 PM 04/24/2023   10:23 AM 12/22/2022    1:00 PM 08/20/2022   12:59 PM  Fall Risk   Falls in the past year? 0 0 0 0 0  Number falls in past yr:  0 0 0 0 0  Injury with Fall? 0 0 0 0 0  Risk for fall due to : Impaired balance/gait No Fall Risks     Risk for fall due to: Comment Hx of BPPV      Follow up Education provided Education provided;Falls evaluation completed Falls evaluation completed;Education provided Falls evaluation completed Falls evaluation completed    MEDICARE RISK AT HOME:  Medicare Risk at Home Any stairs in or around the home?: (Patient-Rptd) No If so, are there any without handrails?: (Patient-Rptd) No Home free of loose throw rugs in walkways, pet beds, electrical cords, etc?: (Patient-Rptd) Yes Adequate lighting in your home to reduce risk of falls?: (Patient-Rptd) Yes Life alert?: (Patient-Rptd) No Use of a cane, walker or w/c?: (Patient-Rptd) No Grab bars in the bathroom?: (Patient-Rptd) No Shower chair or bench in shower?: (Patient-Rptd) No Elevated toilet seat or a handicapped toilet?: (Patient-Rptd) Yes  TIMED UP AND GO:  Was the test performed?  No  Cognitive Function: 6CIT completed        01/25/2024   10:04 AM  6CIT Screen  What Year? 0 points  What month? 0 points  What time? 0 points  Count back from 20 0 points  Months in reverse 0 points  Repeat phrase 0 points  Total Score 0 points    Immunizations Immunization History  Administered Date(s) Administered   Fluad Quad(high Dose 65+) 02/07/2022   Hepatitis B 01/31/2009, 03/05/2009   INFLUENZA, HIGH DOSE SEASONAL PF 01/31/2014, 12/27/2018, 02/06/2020, 01/23/2021, 01/19/2024   Influenza Split 12/20/2014, 01/23/2021, 01/13/2023   Influenza Whole 12/28/2007, 12/28/2008   Influenza-Unspecified 01/06/2012, 12/21/2015, 11/15/2016, 12/23/2017   Moderna Covid-19 Vaccine Bivalent Booster 53yrs & up 02/01/2021, 01/15/2022   Moderna Sars-Covid-2 Vaccination 05/07/2019, 06/06/2019, 02/06/2020, 07/24/2020   Novavax(Covid-19) Vaccine 12/28/2022   PFIZER Comirnaty(Gray Top)Covid-19 Tri-Sucrose Vaccine 01/15/2022   PNEUMOCOCCAL CONJUGATE-20  04/03/2021   Pfizer(Comirnaty)Fall Seasonal Vaccine 12 years and older 01/19/2024   Pneumococcal Conjugate-13 02/20/2014   Pneumococcal Polysaccharide-23 01/22/2017   Pneumococcal-Unspecified 01/06/2012   Respiratory Syncytial Virus Vaccine,Recomb Aduvanted(Arexvy) 05/05/2022   Td 01/29/2009   Tdap 06/05/2013, 03/10/2018, 01/13/2023   Zoster Recombinant(Shingrix) 10/28/2018, 02/26/2019   Zoster, Live 01/06/2012    Screening Tests Health Maintenance  Topic Date Due   Diabetic kidney evaluation - Urine ACR  05/16/2007   Medicare Annual Wellness (AWV)  02/24/2024 (Originally 02/19/1946)   HEMOGLOBIN A1C  02/24/2024   OPHTHALMOLOGY EXAM  03/17/2024   COVID-19 Vaccine (9 - Moderna risk 2025-26 season) 07/19/2024   Diabetic kidney evaluation - eGFR measurement  08/23/2024   FOOT EXAM  08/23/2024   Colonoscopy  07/14/2026   DTaP/Tdap/Td (5 - Td or Tdap) 01/12/2033   Pneumococcal Vaccine: 50+ Years  Completed   Influenza Vaccine  Completed   Hepatitis C Screening  Completed   Zoster Vaccines- Shingrix  Completed   Meningococcal B Vaccine  Aged Out   Hepatitis B Vaccines 19-59 Average Risk  Discontinued    Health Maintenance Items Addressed: Will do urine malb today. All other HM up to date.  Additional Screening:  Vision Screening: Recommended annual ophthalmology exams for early detection of glaucoma and other disorders of the eye. Is the patient up to date with their annual eye exam?  Yes  Who is the provider or what is the name of the office in which the patient attends annual eye exams? [facility]  Dental Screening: Recommended annual dental exams for proper oral hygiene  Community Resource Referral / Chronic Care Management: CRR required this visit?  No   CCM required this visit?  No   Plan:    I have personally reviewed and noted the following in the patient's chart:   Medical and social history Use of alcohol, tobacco or illicit drugs  Current  medications and supplements including opioid prescriptions. Patient is not currently taking opioid prescriptions. Functional ability and status Nutritional status Physical activity Advanced directives List of other physicians Hospitalizations, surgeries, and ER visits in previous 12 months Vitals Screenings to include cognitive, depression, and falls Referrals and appointments  In addition, I have reviewed and discussed with patient certain preventive protocols, quality metrics, and best practice recommendations. A written personalized care plan for preventive services as well as general preventive health recommendations were provided to patient.   Lolita Libra, CMA   01/25/2024   After Visit Summary: (MyChart) Due to this being a telephonic visit, the after visit summary with patients personalized plan was offered to patient via MyChart   Notes: Nothing significant to report at this time.

## 2024-01-25 NOTE — Assessment & Plan Note (Signed)
 Left shoulder pain Having left shoulder for a while, exacerbated by reaching and lifting. Refer to sports medicine   DM type II, neuropathy: Last A1c of 6.6, controlled.  Recheck A1c, if it is elevated potential options include: Continue same meds and improve diet, increase metformin  dose or add GLP-1 receptor agonists.  Plan: - Check A1c to assess current glycemic control. - Encourage dietary modifications and regular exercise. - Rec to start checking blood sugar from time to time.  Further eval for results High cholesterol: Last LDL excellent, on rosuvastatin  40 mg, last LFTs normal, check FLP CAD: No chest pain, on aspirin , statins. Asthma: Since he started Fasenra  he is doing much better.  Follow-up by his allergist General Health Maintenance Up to date with vaccinations. Recent eye examination for diabetes-related changes, awaiting report. RTC 5 months

## 2024-01-25 NOTE — Patient Instructions (Signed)
 GO TO THE LAB :  Get the blood work    Then, go to the front desk for the checkout Please make an appointment     Continue checking your blood pressure regularly Blood pressure goal:  between 110/65 and  135/85. If it is consistently higher or lower, let me know  Diabetes  You can check your sugars at different times  - early in AM fasting  ( blood sugar goal 70-130) - 2 hours after a meal (blood sugar goal less than 180)    Please read more detailed instructions below      YOUR PLAN: LEFT SHOULDER PAIN: You have significant pain in your left shoulder, especially when reaching or lifting. -You will be referred to a sports medicine specialist for further evaluation and management.  TYPE 2 DIABETES MELLITUS: Your diabetes is currently controlled with a recent A1c of 6.6%, but there was a lapse in management over the summer. -Check your A1c to assess current glycemic control. -Make dietary modifications and engage in regular exercise. -If your A1c is above target, we may discuss adding new diabetes medication. -Monitor your blood glucose regularly.  HYPERLIPIDEMIA: Your cholesterol levels need to be reassessed. -Order a fasting lipid panel to check your cholesterol levels.

## 2024-01-25 NOTE — Patient Instructions (Signed)
 Mr. George Alvarez , Thank you for taking time out of your busy schedule to complete your Annual Wellness Visit with me. I enjoyed our conversation and look forward to speaking with you again next year. I, as well as your care team,  appreciate your ongoing commitment to your health goals. Please review the following plan we discussed and let me know if I can assist you in the future. Your Game plan/ To Do List      Follow up Visits: Next Medicare AWV with our clinical staff: 01/26/25 9:40am, telephone    Next Office Visit with your provider: 01/25/24 1pm, Dr Amon  (Ask Kaylyn for the Advanced Directive).  Also, be prepared to give a urine sample today.  Clinician Recommendations:  Aim for 30 minutes of exercise or brisk walking, 6-8 glasses of water, and 5 servings of fruits and vegetables each day.       This is a list of the screening recommended for you and due dates:  Health Maintenance  Topic Date Due   Yearly kidney health urinalysis for diabetes  05/16/2007   Hemoglobin A1C  02/24/2024   Eye exam for diabetics  03/17/2024   COVID-19 Vaccine (9 - Moderna risk 2025-26 season) 07/19/2024   Yearly kidney function blood test for diabetes  08/23/2024   Complete foot exam   08/23/2024   Medicare Annual Wellness Visit  01/24/2025   Colon Cancer Screening  07/14/2026   DTaP/Tdap/Td vaccine (5 - Td or Tdap) 01/12/2033   Pneumococcal Vaccine for age over 63  Completed   Flu Shot  Completed   Hepatitis C Screening  Completed   Zoster (Shingles) Vaccine  Completed   Meningitis B Vaccine  Aged Out   Hepatitis B Vaccine  Discontinued    Advanced directives: (Provided) Advance directive discussed with you today. I have provided a copy for you to complete at home and have notarized. Once this is complete, please bring a copy in to our office so we can scan it into your chart.  Advance Care Planning is important because it:  [x]  Makes sure you receive the medical care that is consistent with your  values, goals, and preferences  [x]  It provides guidance to your family and loved ones and reduces their decisional burden about whether or not they are making the right decisions based on your wishes.  Follow the link provided in your after visit summary or read over the paperwork we have mailed to you to help you started getting your Advance Directives in place. If you need assistance in completing these, please reach out to us  so that we can help you!  See attachments for Preventive Care and Fall Prevention Tips.

## 2024-01-26 ENCOUNTER — Ambulatory Visit: Payer: Self-pay

## 2024-01-26 ENCOUNTER — Ambulatory Visit (HOSPITAL_BASED_OUTPATIENT_CLINIC_OR_DEPARTMENT_OTHER)
Admission: RE | Admit: 2024-01-26 | Discharge: 2024-01-26 | Disposition: A | Discharge status: Home / Self Care | Source: Ambulatory Visit

## 2024-01-26 ENCOUNTER — Ambulatory Visit

## 2024-01-26 VITALS — BP 108/70 | Ht 70.0 in | Wt 200.0 lb

## 2024-01-26 DIAGNOSIS — M7542 Impingement syndrome of left shoulder: Secondary | ICD-10-CM | POA: Insufficient documentation

## 2024-01-26 DIAGNOSIS — S46012A Strain of muscle(s) and tendon(s) of the rotator cuff of left shoulder, initial encounter: Secondary | ICD-10-CM | POA: Diagnosis not present

## 2024-01-26 DIAGNOSIS — M7522 Bicipital tendinitis, left shoulder: Secondary | ICD-10-CM | POA: Insufficient documentation

## 2024-01-26 DIAGNOSIS — M19012 Primary osteoarthritis, left shoulder: Secondary | ICD-10-CM | POA: Diagnosis not present

## 2024-01-26 DIAGNOSIS — M25512 Pain in left shoulder: Secondary | ICD-10-CM | POA: Diagnosis not present

## 2024-01-26 LAB — MICROALBUMIN / CREATININE URINE RATIO
Creatinine,U: 107.4 mg/dL
Microalb Creat Ratio: 6.7 mg/g (ref 0.0–30.0)
Microalb, Ur: 0.7 mg/dL (ref 0.0–1.9)

## 2024-01-26 MED ORDER — NAPROXEN 500 MG PO TABS: 500.0000 mg | ORAL_TABLET | Freq: Two times a day (BID) | ORAL | 1 refills | Status: DC

## 2024-01-26 NOTE — Patient Instructions (Signed)
 You have impingement syndrome in your left shoulder, inflammation of the biceps tendon, and tendinosis (chronic tendinitis) in your supraspinatus and infraspinatus rotator cuff muscles.    The best way to treat this is to start physical therapy to strengthen the muscles and the tendons to make them more resistant to injury and to promote healing.  This also helps with the biomechanics of the shoulder to prevent impingement from happening.  We are starting an anti-inflammatory medicine to reduce pain and optimize your participation in physical therapy she can get the best benefit out of this.  As a backup, we will also get a schedule you right before you leave for your trip to [place]  and we can consider doing an injection at that time if you are experiencing inadequate pain relief from the anti-inflammatory medicine.

## 2024-01-26 NOTE — Progress Notes (Signed)
   Subjective:    Patient ID: George Alvarez, male    DOB: 78 y.o., 11-21-1945   MRN: [id number]  Chief Complaint: R shoulder pain  Discussed the use of AI scribe software for clinical note transcription with the patient, who gave verbal consent to proceed.  History of Present Illness George Alvarez is a 78 year old male with spinal stenosis and arthritis who presents with left shoulder pain. He was referred by his primary care doctor for evaluation of his shoulder pain.  Left shoulder pain - Progressive pain localized to the left shoulder - Pain is severe with reaching for objects or attempting to throw a ball - Intense nocturnal pain when sleeping with left arm under the pillow - Requires use of right hand to move the left arm due to pain - Movements such as curls do not exacerbate pain, but reaching up or out increases pain - No history of significant prior shoulder injury, including dislocation or separation - No prior shoulder surgeries  Activity and exacerbating factors - Increased activity with weights and resistance bands at the [facility] - No recent injuries or changes in activity identified as a trigger for shoulder pain - Played soccer for fifty years without recalling any shoulder injuries from that activity  Pain management and response to treatment - Uses BPD pain stick and acetaminophen  at night, primarily for back pain, with some relief for shoulder pain - Current pain management strategies have not been significantly effective     Objective:   Vitals:   01/26/24 1131  BP: 108/70    left Shoulder ( compared to normal ) Inspection: - swelling, - scapular dyskinesis Palpation: TTP - greater tuberosity, - AC joint, + biceps tendon, - posterior shoulder AROM/PROM: 180 forward flexion, 180 abduction, 60 external rotation, internal rotation to thoracic spine Strength: 5/5 lift off, 5/5 empty can, 5-/5 external rotation, 5/5 flexion, - drop arm test Special tests:     -Rotator Cuff: + Neer's, ++ Hawkin's, - empty can, + painful arc, + Hornblower   -Labrum: + O'brien's, - Jerk   -Biceps: + speed's, - yergason's    -AC Joint: + cross arm testing     -Instability: - external rotation/apprehension/relocation test, - sulcus sign     Assessment & Plan:   Assessment & Plan Left shoulder impingement syndrome with rotator cuff tendinopathy   He experiences chronic left shoulder pain, worsened by reaching and lifting, with severe pain when sleeping with the arm under the pillow. Tenderness over the biceps tendon and pain during specific movements suggest inflammation of the biceps tendon and possible supraspinatus and infraspinatus tendinopathy. There is no evidence of a complete rotator cuff tear. The differential includes shoulder impingement and rotator cuff tendinopathy. Prescribe a stronger NSAID for 2-3 weeks to reduce inflammation, with caution regarding potential effects on kidney, stomach, and heart. Refer to physical therapy to strengthen shoulder tendons, emphasizing that tendons heal best with some tension. Order a shoulder x-ray to assess acromion morphology. Schedule a follow-up visit before February 17, 2024, to reassess pain and consider a corticosteroid injection if necessary, especially before his trip to [place] .

## 2024-01-27 ENCOUNTER — Ambulatory Visit: Payer: Self-pay | Admitting: Internal Medicine

## 2024-01-28 MED ORDER — METFORMIN HCL ER 500 MG PO TB24: 1000.0000 mg | ORAL_TABLET | Freq: Every day | ORAL | 1 refills | Status: AC

## 2024-01-29 ENCOUNTER — Other Ambulatory Visit: Payer: Self-pay | Admitting: Internal Medicine

## 2024-02-05 ENCOUNTER — Ambulatory Visit: Admitting: Podiatry

## 2024-02-05 ENCOUNTER — Encounter: Payer: Self-pay | Admitting: Podiatry

## 2024-02-05 DIAGNOSIS — M79674 Pain in right toe(s): Secondary | ICD-10-CM | POA: Diagnosis not present

## 2024-02-05 DIAGNOSIS — B351 Tinea unguium: Secondary | ICD-10-CM | POA: Diagnosis not present

## 2024-02-05 DIAGNOSIS — E119 Type 2 diabetes mellitus without complications: Secondary | ICD-10-CM

## 2024-02-05 DIAGNOSIS — M79675 Pain in left toe(s): Secondary | ICD-10-CM | POA: Diagnosis not present

## 2024-02-05 NOTE — Progress Notes (Signed)
 Subjective:  Patient ID: George Alvarez, male    DOB: Oct 13, 1945,   MRN: [id number]  Chief Complaint  Patient presents with   Diabetes    Cut my toenails. Saw Dr. BETHAAmon - 01/25/2024; A1c - 7.2     78 y.o. male presents for diabetic foot exam and  concern of thickened elongated and painful nails that are difficult to trim.   Relates burning and tingling in their feet. Patient is diabetic and last A1c was  Lab Results  Component Value Date   HGBA1C 7.2 (H) 01/25/2024   .   PCP:  Amon Aloysius BRAVO, MD    . Denies any other pedal complaints. Denies n/v/f/c.   Past Medical History:  Diagnosis Date   Allergy    seasonal   Anal fissure    Arthritis    Asthma, chronic 06/16/2007   Qualifier: Diagnosis of  By: Tish MD, Elsie   Onset:as child Triggers (environmental, infectious, allergic): all, mainly environmental triggers Rescue inhaler ldz:mjmzob Maintenance medications/ response:Singulair ,Qvar  Smoking history:never Family history pulmonary disease: no     Basal cell carcinoma of chest wall 2016   1.8 cm, treated with electrodessication and currettage   Benign paroxysmal positional vertigo 11/19/2012   Diagnosed at [facility], [place]  Physical therapy appointment pending    BPH (benign prostatic hyperplasia)    With urinary obstruction   Coronary artery disease    a. 02/2012 Cath/PCI: LM 10, LAD min irregs, LCX large, OM1 sm, 40 ost, OM2 95p (5.0x16 Veriflex & 4.5x12 Veriflex BMS'), RCA 30p, 58m, 30d, PDA/PLA min irregs, EF 65%   Diabetes mellitus without complication (HCC)    Diverticulosis    Erectile dysfunction due to arterial insufficiency    Fundic gland polyps of stomach, benign 2010   GERD (gastroesophageal reflux disease)    History of elevated PSA    History of kidney stones    Hyperlipidemia    Hypertension    Nocturia    Perennial allergic rhinitis    Peyronie's disease    Skin cancer    Basal and squamous cell cancers, greater than 20   Stroke (HCC)  2016   Tubular adenoma of colon 2010    Objective:  Physical Exam: Vascular: DP/PT pulses 2/4 bilateral. CFT <3 seconds. Absent hair growth on digits. Edema noted to bilateral lower extremities. Xerosis noted bilaterally.  Skin. No lacerations or abrasions bilateral feet. Nails 1-5 bilateral  are thickened discolored and elongated with subungual debris. Small area of hemorrhage from cutting himself while attempting to trim his nails.  Musculoskeletal: MMT 5/5 bilateral lower extremities in DF, PF, Inversion and Eversion. Deceased ROM in DF of ankle joint.  Neurological: Sensation intact to light touch. Protective sensation diminished bilateral.    Assessment:   1. Pain due to onychomycosis of toenails of both feet   2. Diabetes mellitus without complication (HCC)       Plan:  Patient was evaluated and treated and all questions answered. -Discussed and educated patient on diabetic foot care, especially with  regards to the vascular, neurological and musculoskeletal systems.  -Stressed the importance of good glycemic control and the detriment of not  controlling glucose levels in relation to the foot. -Discussed supportive shoes at all times and checking feet regularly.  -Mechanically debrided all nails 1-5 bilateral using sterile nail nipper and filed with dremel without incident  -Answered all patient questions -Patient to return  in 3 months for at risk foot care -Patient advised to call  the office if any problems or questions arise in the meantime.   Asberry Failing, DPM

## 2024-02-12 ENCOUNTER — Ambulatory Visit

## 2024-02-12 VITALS — BP 130/80 | Ht 70.0 in | Wt 200.0 lb

## 2024-02-12 DIAGNOSIS — M7522 Bicipital tendinitis, left shoulder: Secondary | ICD-10-CM | POA: Diagnosis not present

## 2024-02-12 DIAGNOSIS — M7542 Impingement syndrome of left shoulder: Secondary | ICD-10-CM

## 2024-02-12 DIAGNOSIS — S46012A Strain of muscle(s) and tendon(s) of the rotator cuff of left shoulder, initial encounter: Secondary | ICD-10-CM

## 2024-02-12 NOTE — Progress Notes (Signed)
   Subjective:    Patient ID: George Alvarez, male    DOB: 78 y.o., June 05, 1945   MRN: [id number]  Chief Complaint: Left shoulder impingement, rotator cuff tendinopathy, biceps tendinosis  Discussed the use of AI scribe software for clinical note transcription with the patient, who gave verbal consent to proceed.  History of Present Illness George Alvarez is a 78 year old male who presents with shoulder pain for follow-up and management.  Shoulder pain - Persistent shoulder pain managed with regular use of naproxen, which provides some relief - Avoids activities that exacerbate shoulder pain and is cautious to not put stress on the shoulder - Has not started physical therapy due to scheduling conflicts; plans to begin after returning from [place]  in mid-December - Not taking Aleve while on naproxen to avoid masking symptoms  Activity modification and exercise - Participates in [employer] activities, including 'silver sneakers' and yoga - Modifies exercises to avoid shoulder strain     Objective:   Vitals:   02/12/24 1008  BP: 130/80    Const: appears well, non-toxic, well groomed Psych: affect bright, interactive, smiling EENT: EOMI intact, conjunctiva appear normal Neck: no obvious masses, appears symmetric Resp: non-labored, appears symmetric Neuro: muscle bulk appears normal Skin: no obvious rashes noted   Examination of the left shoulder was deferred today.     Assessment & Plan:   Assessment & Plan Left shoulder impingement syndrome with rotator cuff tendinopathy   Chronic left shoulder impingement syndrome with rotator cuff tendinopathy. Naproxen provides some relief. No physical therapy initiated due to travel plans. Potential for corticosteroid injection discussed if pain persists post-travel. Emphasis on home exercises to prepare for future physical therapy. Continue naproxen while traveling, taper to once daily in the last week to two weeks. Provided printout of  home exercises for shoulder conditioning. Will initiate physical therapy upon return from travel. Will consider corticosteroid injection if pain persists after tapering off naproxen.  Follow-up in approximately 8 weeks to reassess.

## 2024-02-15 ENCOUNTER — Ambulatory Visit

## 2024-02-15 DIAGNOSIS — J4550 Severe persistent asthma, uncomplicated: Secondary | ICD-10-CM | POA: Diagnosis not present

## 2024-02-23 ENCOUNTER — Ambulatory Visit

## 2024-03-03 ENCOUNTER — Other Ambulatory Visit: Payer: Self-pay

## 2024-03-03 DIAGNOSIS — S46012A Strain of muscle(s) and tendon(s) of the rotator cuff of left shoulder, initial encounter: Secondary | ICD-10-CM

## 2024-03-03 DIAGNOSIS — M7542 Impingement syndrome of left shoulder: Secondary | ICD-10-CM

## 2024-03-03 DIAGNOSIS — M7522 Bicipital tendinitis, left shoulder: Secondary | ICD-10-CM

## 2024-03-09 ENCOUNTER — Other Ambulatory Visit: Payer: Self-pay | Admitting: *Deleted

## 2024-03-09 MED ORDER — FASENRA PEN 30 MG/ML ~~LOC~~ SOAJ: 30.0000 mg | SUBCUTANEOUS | 6 refills | Status: AC

## 2024-03-17 ENCOUNTER — Ambulatory Visit

## 2024-03-17 ENCOUNTER — Other Ambulatory Visit: Payer: Self-pay

## 2024-03-17 DIAGNOSIS — M7522 Bicipital tendinitis, left shoulder: Secondary | ICD-10-CM | POA: Diagnosis not present

## 2024-03-17 DIAGNOSIS — G8929 Other chronic pain: Secondary | ICD-10-CM | POA: Insufficient documentation

## 2024-03-17 DIAGNOSIS — M25819 Other specified joint disorders, unspecified shoulder: Secondary | ICD-10-CM

## 2024-03-17 DIAGNOSIS — M25512 Pain in left shoulder: Secondary | ICD-10-CM | POA: Diagnosis present

## 2024-03-17 DIAGNOSIS — M7542 Impingement syndrome of left shoulder: Secondary | ICD-10-CM | POA: Insufficient documentation

## 2024-03-17 DIAGNOSIS — S46012A Strain of muscle(s) and tendon(s) of the rotator cuff of left shoulder, initial encounter: Secondary | ICD-10-CM | POA: Insufficient documentation

## 2024-03-17 DIAGNOSIS — R293 Abnormal posture: Secondary | ICD-10-CM | POA: Insufficient documentation

## 2024-03-17 NOTE — Therapy (Signed)
 OUTPATIENT PHYSICAL THERAPY SHOULDER EVALUATION   Patient Name: George Alvarez MRN: [id number] DOB:[birth year], 78 y.o., male Today's Date: 03/17/2024  END OF SESSION:  PT End of Session - 03/17/24 1310     Visit Number 1    Date for Recertification  05/12/24    Progress Note Due on Visit 10    PT Start Time 1310    PT Stop Time 1355    PT Time Calculation (min) 45 min    Activity Tolerance Patient tolerated treatment well    Behavior During Therapy [facility] for tasks assessed/performed          Past Medical History:  Diagnosis Date   Allergy    seasonal   Anal fissure    Arthritis    Asthma, chronic 06/16/2007   Qualifier: Diagnosis of  By: Tish MD, Elsie   Onset:as child Triggers (environmental, infectious, allergic): all, mainly environmental triggers Rescue inhaler ldz:mjmzob Maintenance medications/ response:Singulair ,Qvar  Smoking history:never Family history pulmonary disease: no     Basal cell carcinoma of chest wall 2016   1.8 cm, treated with electrodessication and currettage   Benign paroxysmal positional vertigo 11/19/2012   Diagnosed at [facility], [place]  [facility]  Physical therapy appointment pending    BPH (benign prostatic hyperplasia)    With urinary obstruction   Coronary artery disease    a. 02/2012 Cath/PCI: LM 10, LAD min irregs, LCX large, OM1 sm, 40 ost, OM2 95p (5.0x16 Veriflex & 4.5x12 Veriflex BMS'), RCA 30p, 61m, 30d, PDA/PLA min irregs, EF 65%   Diabetes mellitus without complication (HCC)    Diverticulosis    Erectile dysfunction due to arterial insufficiency    Fundic gland polyps of stomach, benign 2010   GERD (gastroesophageal reflux disease)    History of elevated PSA    History of kidney stones    Hyperlipidemia    Hypertension    Nocturia    Perennial allergic rhinitis    Peyronie's disease    Skin cancer    Basal and squamous cell cancers, greater than 20   Stroke (HCC) 2016   Tubular adenoma of colon 2010   Past Surgical  History:  Procedure Laterality Date   COLONOSCOPY  2016   COLONOSCOPY  06/2020   CORONARY ANGIOPLASTY WITH STENT PLACEMENT  02/06/2012   OM2  bare metal    EPIDURAL BLOCK INJECTION  04-2015   Dr Letha   epidural steroids  2007, 2009   X 2 @ cervical &, lumbar)   HERNIA REPAIR     INTRAVASCULAR ULTRASOUND  02/06/2012   Procedure: INTRAVASCULAR ULTRASOUND;  Surgeon: Lonni JONETTA Cash, MD;  Location: [facility] CATH LAB;  Service: Cardiovascular;;   LEFT HEART CATH AND CORONARY ANGIOGRAPHY N/A 09/30/2019   Procedure: LEFT HEART CATH AND CORONARY ANGIOGRAPHY;  Surgeon: Cash Lonni JONETTA, MD;  Location: [facility] INVASIVE CV LAB;  Service: Cardiovascular;  Laterality: N/A;   LEFT HEART CATHETERIZATION WITH CORONARY ANGIOGRAM N/A 02/06/2012   Procedure: LEFT HEART CATHETERIZATION WITH CORONARY ANGIOGRAM;  Surgeon: Lonni JONETTA Cash, MD;  Location: [facility] CATH LAB;  Service: Cardiovascular;  Laterality: N/A;   LUMBAR LAMINECTOMY/DECOMPRESSION MICRODISCECTOMY N/A 10/21/2017   Procedure: Lumbar Two-Three, Lumbar Four-Five Discectomy;  Surgeon: Gillie Duncans, MD;  Location: [facility] OR;  Service: Neurosurgery;  Laterality: N/A;   PERCUTANEOUS CORONARY STENT INTERVENTION (PCI-S)  02/06/2012   Procedure: PERCUTANEOUS CORONARY STENT INTERVENTION (PCI-S);  Surgeon: Lonni JONETTA Cash, MD;  Location: [facility] CATH LAB;  Service: Cardiovascular;;   septoplasty     TONSILLECTOMY AND  ADENOIDECTOMY     TRIGGER FINGER RELEASE     UPPER GASTROINTESTINAL ENDOSCOPY  2011   gastric polyps   VASECTOMY     Patient Active Problem List   Diagnosis Date Noted   Other allergic rhinitis 04/28/2022   Severe persistent asthma without complication (HCC) 04/08/2022   Conjunctivitis 04/08/2022   TMJ (temporomandibular joint disorder) 09/17/2021   MRSA (methicillin resistant Staphylococcus aureus) infection 09/17/2021   Rash 08/16/2021   Diabetes mellitus without complication (HCC) 04/03/2021   Intrinsic atopic dermatitis  11/22/2019   Chest pain of uncertain etiology    Viral upper respiratory tract infection with cough 07/06/2017   Current use of beta blocker 08/09/2015   Moderate persistent asthma without complication 02/20/2015   Seasonal and perennial allergic rhinitis 02/20/2015   PCP NOTES >>> 01/11/2015   Cerebellar infarct (HCC) 06/26/2014   Annual physical exam 02/20/2014   Benign paroxysmal positional vertigo 11/19/2012   CAD (coronary artery disease) 02/07/2012   Gastroesophageal reflux disease without esophagitis 01/01/2009   SKIN CANCER, HX OF 12/28/2007   Hyperlipidemia 06/16/2007   Essential hypertension 06/16/2007   Asthma, chronic 06/16/2007   Spinal stenosis 03/01/2007   NEPHROLITHIASIS, HX OF 05/14/2006    PCP: Amon Schanz, MD  REFERRING PROVIDER: Charles Redell LABOR, DO  REFERRING DIAG: L shoulder impingement  THERAPY DIAG:  No diagnosis found.  Rationale for Evaluation and Treatment: Rehabilitation  ONSET DATE:  11/2023  SUBJECTIVE:                                                                                                                                                                                      SUBJECTIVE STATEMENT: Reports he's been taking Naproxen  2 x a day and performing the ex from the doctor while on vacation in the last month.  Over the last week has reduced the naproxen  to once a day.  The combination of meds and ex have worked, pain much better L shoulder than it was when he saw Dr Charles   Hand dominance: Right  PERTINENT HISTORY: Progressive history of pain L shoulder primarily with abduction greater than shoulder height , also notes when he [facility] [employer] [facility] [employer] that   PAIN:  Are you having pain? Pain L shoulder, superior jt line with abduction greater than 90, also notes when sleeping on L side with L arm under pillow his L arm will lock up   PRECAUTIONS: None  RED FLAGS: None   WEIGHT BEARING RESTRICTIONS:  No  FALLS:  Has patient fallen in last 6 months? No  LIVING ENVIRONMENT: Lives with: lives with their spouse Lives in: House/apartment Stairs: No Has  following equipment at home: None  OCCUPATION: retired  PLOF: Independent  PATIENT GOALS:able to manage L shoulder without pain  NEXT MD VISIT:   OBJECTIVE:  Note: Objective measures were completed at Evaluation unless otherwise noted.  DIAGNOSTIC FINDINGS:  Mild AC arthritis and Type II vs Type lll acromion  PATIENT SURVEYS:  PSFS: THE PATIENT SPECIFIC FUNCTIONAL SCALE  Place score of 0-10 (0 = unable to perform activity and 10 = able to perform activity at the same level as before injury or problem)  Activity Date: 03/17/14    Sleep on L side without pain 8    2. Abduct L shoulder past 90  0    3. Be able to perform fitness classes without L shoulder pain 2    4.      Total Score 10/3:3.33      Total Score = Sum of activity scores/number of activities  Minimally Detectable Change: 3 points (for single activity); 2 points (for average score)  [facility] (nd). The Patient Specific Functional Scale . Retrieved from [email or web address]   COGNITION: Overall cognitive status: Within functional limits for tasks assessed     SENSATION: WFL  POSTURE: Healthy male, mild increased thoracic kyphosis  UPPER EXTREMITY ROM:   Active/Passive ROM Right eval Left eval  Shoulder flexion 115 115/wnl  Shoulder extension  Wnl  Shoulder abduction  90/wnl  Shoulder adduction    Shoulder internal rotation  T8/70  Shoulder external rotation  To T2/68  Elbow flexion    Elbow extension    Wrist flexion    Wrist extension    Wrist ulnar deviation    Wrist radial deviation    Wrist pronation    Wrist supination    (Blank rows = wnl)  UPPER EXTREMITY MMT:  MMT Right eval Left eval  Shoulder flexion  Wnl p!  Shoulder extension    Shoulder abduction   4+p!  Shoulder adduction    Shoulder internal rotation    Shoulder external rotation  4+ , p!  Middle trapezius  3+ p!  Lower trapezius  5   Elbow flexion    Elbow extension    Wrist flexion    Wrist extension    Wrist ulnar deviation    Wrist radial deviation    Wrist pronation    Wrist supination    Grip strength (lbs)    (Blank rows = wnl)   JOINT MOBILITY TESTING:  Decreased A/P humeral head mobility, normal scapular mobility  PALPATION:  Pt tender in muscle belly L supraspinatus and distal attachment proximal humeral head, non tender biceps non tender L infraspinatus and teres minor, subscapularis                                                                                                                             TREATMENT DATE: 03/17/24: Carlene. Instructed in 3 new exercises to improve mechanics, motor control of L shoulder and provide better spacing:  Pendulums to improve jt mobility, advised to utilize prior to his exercise sessions at [employer] over L shoulder extension with 5# Wall slides into B shoulder flexion with hands in pillow case with sustained shoulder horizontal abduction against pillow case     PATIENT EDUCATION: Education details: POC, goals Person educated: Patient Education method: Explanation, Demonstration, and Tactile cues Education comprehension: verbalized understanding, returned demonstration, verbal cues required, tactile cues required, and needs further education  HOME EXERCISE PROGRAM: Access Code: [email or web address] URL: [email or web address] Date: 03/17/2024 Prepared by: Greig Credit  Exercises - Circular Shoulder Pendulum with Table Support  - 1 x daily - 7 x weekly - 3 sets - 10 reps - Bilateral Shoulder Flexion Wall Slide with Towel  - 1 x daily - 3 x weekly - 3 sets - 10 reps - Single Arm Bent Over Shoulder Extension with Dumbbell  - 1 x daily - 3 x weekly - 3 sets - 10 reps  ASSESSMENT:  CLINICAL IMPRESSION: Patient is a  78 y.o. male who was evaluated today by physical therapy  for L shoulder impingement. He has been out of [place] for greater than one month, has been taking naproxen  and performing exercises as prescribed by the referring MD, improved since his original PT referral.  Overall pain better, but still easily replicated today with abduction over 90 and with resisted L shoulder abd and ER.  We adapted his home exercise routine and he also is going [facility] [employer] and we will re assess next visit.    OBJECTIVE IMPAIRMENTS: decreased knowledge of condition, decreased ROM, decreased strength, hypomobility, increased fascial restrictions, impaired UE functional use, postural dysfunction, and pain.   ACTIVITY LIMITATIONS: lifting, sleeping, and reach over head  PARTICIPATION LIMITATIONS: meal prep, cleaning, laundry, and community activity  PERSONAL FACTORS: Age, Behavior pattern, Education, Time since onset of injury/illness/exacerbation, and 1-2 comorbidities: arthritis, DM are also affecting patient's functional outcome.   REHAB POTENTIAL: Good  CLINICAL DECISION MAKING: Evolving/moderate complexity  EVALUATION COMPLEXITY: Moderate   GOALS: Goals reviewed with patient? Yes  SHORT TERM GOALS: Target date: 2 weeks,03/31/24  I HEP Baseline: Goal status: INITIAL  LONG TERM GOALS: Target date: 8 weeks, 05/12/24  PSFS improve from 3.33 to 7 or greater Baseline:  Goal status: INITIAL  2.  Pain free and full active L shoulder abd Baseline:  Goal status: INITIAL  3.  Strength L shoulder ER , flex, abd wnl Baseline: see above chart Goal status: INITIAL    PLAN:  PT FREQUENCY: every other week  PT DURATION: 8 weeks  PLANNED INTERVENTIONS: 97110-Therapeutic exercises, 97530- Therapeutic activity, [id number]- Neuromuscular re-education, 97535- Self Care, [id number]- Manual therapy, and Patient/Family education  PLAN FOR NEXT SESSION: reassess strength, ROM, add jt mobilizations  as needed and other manual techniques, progress with therex   Laketa Sandoz L Eleanore Junio, PT, DPT,OCS 03/17/2024, 1:19 PM

## 2024-03-21 ENCOUNTER — Ambulatory Visit

## 2024-03-22 ENCOUNTER — Ambulatory Visit: Admitting: Physical Therapy

## 2024-03-25 ENCOUNTER — Other Ambulatory Visit: Payer: Self-pay | Admitting: Internal Medicine

## 2024-03-26 ENCOUNTER — Other Ambulatory Visit: Payer: Self-pay | Admitting: Family Medicine

## 2024-04-05 ENCOUNTER — Ambulatory Visit: Admitting: Rehabilitation

## 2024-04-05 ENCOUNTER — Encounter: Payer: Self-pay | Admitting: Rehabilitation

## 2024-04-05 DIAGNOSIS — M25512 Pain in left shoulder: Secondary | ICD-10-CM | POA: Diagnosis not present

## 2024-04-05 DIAGNOSIS — R293 Abnormal posture: Secondary | ICD-10-CM

## 2024-04-05 DIAGNOSIS — M25819 Other specified joint disorders, unspecified shoulder: Secondary | ICD-10-CM

## 2024-04-05 DIAGNOSIS — G8929 Other chronic pain: Secondary | ICD-10-CM

## 2024-04-05 NOTE — Therapy (Unsigned)
 " OUTPATIENT PHYSICAL THERAPY SHOULDER TREATMENT   Patient Name: George Alvarez MRN: [id number] DOB:[birth year], 78 y.o., male Today's Date: 04/05/2024  END OF SESSION:  PT End of Session - 04/05/24 0931     Visit Number 2    Date for Recertification  05/12/24    Progress Note Due on Visit 10    PT Start Time 0932    PT Stop Time 1010    PT Time Calculation (min) 38 min    Activity Tolerance Patient tolerated treatment well    Behavior During Therapy [facility] for tasks assessed/performed          Past Medical History:  Diagnosis Date   Allergy    seasonal   Anal fissure    Arthritis    Asthma, chronic 06/16/2007   Qualifier: Diagnosis of  By: Tish MD, Elsie   Onset:as child Triggers (environmental, infectious, allergic): all, mainly environmental triggers Rescue inhaler ldz:mjmzob Maintenance medications/ response:Singulair ,Qvar  Smoking history:never Family history pulmonary disease: no     Basal cell carcinoma of chest wall 2016   1.8 cm, treated with electrodessication and currettage   Benign paroxysmal positional vertigo 11/19/2012   Diagnosed at [facility], [place]  [facility]  Physical therapy appointment pending    BPH (benign prostatic hyperplasia)    With urinary obstruction   Coronary artery disease    a. 02/2012 Cath/PCI: LM 10, LAD min irregs, LCX large, OM1 sm, 40 ost, OM2 95p (5.0x16 Veriflex & 4.5x12 Veriflex BMS'), RCA 30p, 59m, 30d, PDA/PLA min irregs, EF 65%   Diabetes mellitus without complication (HCC)    Diverticulosis    Erectile dysfunction due to arterial insufficiency    Fundic gland polyps of stomach, benign 2010   GERD (gastroesophageal reflux disease)    History of elevated PSA    History of kidney stones    Hyperlipidemia    Hypertension    Nocturia    Perennial allergic rhinitis    Peyronie's disease    Skin cancer    Basal and squamous cell cancers, greater than 20   Stroke (HCC) 2016   Tubular adenoma of colon 2010   Past Surgical  History:  Procedure Laterality Date   COLONOSCOPY  2016   COLONOSCOPY  06/2020   CORONARY ANGIOPLASTY WITH STENT PLACEMENT  02/06/2012   OM2  bare metal    EPIDURAL BLOCK INJECTION  04-2015   Dr Letha   epidural steroids  2007, 2009   X 2 @ cervical &, lumbar)   HERNIA REPAIR     INTRAVASCULAR ULTRASOUND  02/06/2012   Procedure: INTRAVASCULAR ULTRASOUND;  Surgeon: Lonni JONETTA Cash, MD;  Location: [facility] CATH LAB;  Service: Cardiovascular;;   LEFT HEART CATH AND CORONARY ANGIOGRAPHY N/A 09/30/2019   Procedure: LEFT HEART CATH AND CORONARY ANGIOGRAPHY;  Surgeon: Cash Lonni JONETTA, MD;  Location: [facility] INVASIVE CV LAB;  Service: Cardiovascular;  Laterality: N/A;   LEFT HEART CATHETERIZATION WITH CORONARY ANGIOGRAM N/A 02/06/2012   Procedure: LEFT HEART CATHETERIZATION WITH CORONARY ANGIOGRAM;  Surgeon: Lonni JONETTA Cash, MD;  Location[facility] CATH LAB;  Service: Cardiovascular;  Laterality: N/A;   LUMBAR LAMINECTOMY/DECOMPRESSION MICRODISCECTOMY N/A 10/21/2017   Procedure: Lumbar Two-Three, Lumbar Four-Five Discectomy;  Surgeon: Gillie Duncans, MD;  Location: [facility] OR;  Service: Neurosurgery;  Laterality: N/A;   PERCUTANEOUS CORONARY STENT INTERVENTION (PCI-S)  02/06/2012   Procedure: PERCUTANEOUS CORONARY STENT INTERVENTION (PCI-S);  Surgeon: Lonni JONETTA Cash, MD;  Location: [facility] CATH LAB;  Service: Cardiovascular;;   septoplasty     TONSILLECTOMY  AND ADENOIDECTOMY     TRIGGER FINGER RELEASE     UPPER GASTROINTESTINAL ENDOSCOPY  2011   gastric polyps   VASECTOMY     Patient Active Problem List   Diagnosis Date Noted   Other allergic rhinitis 04/28/2022   Severe persistent asthma without complication (HCC) 04/08/2022   Conjunctivitis 04/08/2022   TMJ (temporomandibular joint disorder) 09/17/2021   MRSA (methicillin resistant Staphylococcus aureus) infection 09/17/2021   Rash 08/16/2021   Diabetes mellitus without complication (HCC) 04/03/2021   Intrinsic atopic dermatitis  11/22/2019   Chest pain of uncertain etiology    Viral upper respiratory tract infection with cough 07/06/2017   Current use of beta blocker 08/09/2015   Moderate persistent asthma without complication 02/20/2015   Seasonal and perennial allergic rhinitis 02/20/2015   PCP NOTES >>> 01/11/2015   Cerebellar infarct (HCC) 06/26/2014   Annual physical exam 02/20/2014   Benign paroxysmal positional vertigo 11/19/2012   CAD (coronary artery disease) 02/07/2012   Gastroesophageal reflux disease without esophagitis 01/01/2009   SKIN CANCER, HX OF 12/28/2007   Hyperlipidemia 06/16/2007   Essential hypertension 06/16/2007   Asthma, chronic 06/16/2007   Spinal stenosis 03/01/2007   NEPHROLITHIASIS, HX OF 05/14/2006    PCP: Amon Schanz, MD  REFERRING PROVIDER: Charles Redell LABOR, DO  REFERRING DIAG: L shoulder impingement  THERAPY DIAG:  Chronic left shoulder pain  Abnormal posture  Impingement of shoulder  Rationale for Evaluation and Treatment: Rehabilitation  ONSET DATE:  11/2023  SUBJECTIVE:                                                                                                                                                                                      SUBJECTIVE STATEMENT: Patient reports feeling much better.  Rates L shoulder pain 0/10   Hand dominance: Right  PERTINENT HISTORY: Progressive history of pain L shoulder primarily with abduction greater than shoulder height , also notes when [facility] [employer] with [facility] that   PAIN:  Are you having pain? Pain L shoulder, superior jt line with abduction greater than 90, also notes when sleeping on L side with L arm under pillow his L arm will lock up   PRECAUTIONS: None  RED FLAGS: None   WEIGHT BEARING RESTRICTIONS: No  FALLS:  Has patient fallen in last 6 months? No  LIVING ENVIRONMENT: Lives with: lives with their spouse Lives in: House/apartment Stairs: No Has following equipment  at home: None  OCCUPATION: retired  PLOF: Independent  PATIENT GOALS:able to manage L shoulder without pain  NEXT MD VISIT:   OBJECTIVE:  Note: Objective measures were completed at Evaluation unless otherwise noted.  DIAGNOSTIC FINDINGS:  Mild AC arthritis and Type II vs Type lll acromion  PATIENT SURVEYS:  PSFS: THE PATIENT SPECIFIC FUNCTIONAL SCALE  Place score of 0-10 (0 = unable to perform activity and 10 = able to perform activity at the same level as before injury or problem)  Activity Date: 03/17/14    Sleep on L side without pain 8    2. Abduct L shoulder past 90  0    3. Be able to perform fitness classes without L shoulder pain 2    4.      Total Score 10/3:3.33      Total Score = Sum of activity scores/number of activities  Minimally Detectable Change: 3 points (for single activity); 2 points (for average score)  [employer] (nd). The Patient Specific Functional Scale . Retrieved from [email or web address]   COGNITION: Overall cognitive status: Within functional limits for tasks assessed     SENSATION: WFL  POSTURE: Healthy male, mild increased thoracic kyphosis  UPPER EXTREMITY ROM:   Active/Passive ROM Right eval Left eval  Shoulder flexion 115 115/wnl  Shoulder extension  Wnl  Shoulder abduction  90/wnl  Shoulder adduction    Shoulder internal rotation  T8/70  Shoulder external rotation  To T2/68  Elbow flexion    Elbow extension    Wrist flexion    Wrist extension    Wrist ulnar deviation    Wrist radial deviation    Wrist pronation    Wrist supination    (Blank rows = wnl)  UPPER EXTREMITY MMT:  MMT Right eval Left eval  Shoulder flexion  Wnl p!  Shoulder extension    Shoulder abduction  4+p!  Shoulder adduction    Shoulder internal rotation    Shoulder external rotation  4+ , p!  Middle trapezius  3+ p!  Lower trapezius  5   Elbow flexion    Elbow extension     Wrist flexion    Wrist extension    Wrist ulnar deviation    Wrist radial deviation    Wrist pronation    Wrist supination    Grip strength (lbs)    (Blank rows = wnl)   JOINT MOBILITY TESTING:  Decreased A/P humeral head mobility, normal scapular mobility  PALPATION:  Pt tender in muscle belly L supraspinatus and distal attachment proximal humeral head, non tender biceps non tender L infraspinatus and teres minor, subscapularis                                                                                                                             TREATMENT DATE:  04/05/24 THERAPEUTIC EXERCISE: To improve strength and ROM.  Demonstration, verbal and tactile cues throughout for technique. UBE L1 x 2.5'F/2.5B  THERAPEUTIC ACTIVITIES: To improve functional performance.  Demonstration, verbal and tactile cues throughout for technique. Corner pec stretch x 1' x 2 BUE Doorway shoulder extension stretch x 1' x 2 BUE Doorway ER stretch x  1' x 2 LUE Rechecked strength and abduction is 3+/5 and painful L shoulder Rechecked ROM and L shoulder has 60 deg ER Seated rowing 20# low grip x 20;  20# high grip x 20 BUE  NEUROMUSCULAR RE-EDUCATION: To improve kinesthesia, posture, and proprioception. Scapular clocks on elevated table from 11:00- 7:00 YTB x 6 circuits LUE Bent over high row 0# x 20 LUE    03/17/24: Eval. Instructed in 3 new exercises to improve mechanics, motor control of L shoulder and provide better spacing:  Pendulums to improve jt mobility, advised to utilize prior to his exercise sessions at [employer] over L shoulder extension with 5# Wall slides into B shoulder flexion with hands in pillow case with sustained shoulder horizontal abduction against pillow case     PATIENT EDUCATION: Education details: POC, goals Person educated: Patient Education method: Explanation, Demonstration, and Tactile cues Education comprehension: verbalized understanding, returned  demonstration, verbal cues required, tactile cues required, and needs further education  HOME EXERCISE PROGRAM: Access Code: [email or web address] URL: [email or web address] Date: 04/05/2024 Prepared by: Garnette Montclair  Exercises - Circular Shoulder Pendulum with Table Support  - 1 x daily - 7 x weekly - 3 sets - 10 reps - Bilateral Shoulder Flexion Wall Slide with Towel  - 1 x daily - 3 x weekly - 3 sets - 10 reps - Single Arm Bent Over Shoulder Extension with Dumbbell  - 1 x daily - 3 x weekly - 3 sets - 10 reps - Standing Shoulder External Rotation Stretch in Doorway  - 1 x daily - 7 x weekly - 1 sets - 1 reps - 1 min hold - Corner Pec Major Stretch  - 1 x daily - 7 x weekly - 1 sets - 1 reps - hold - Corner Pec Minor Stretch  - 1 x daily - 7 x weekly - 1 sets - 1 reps - 1 min hold - Scapular Clock in Plank Position With Resistance Band  - 1 x daily - 7 x weekly - 1 sets - 10 reps - Prone Shoulder Row in Abduction  - 1 x daily - 7 x weekly - 3 sets - 10 reps  ASSESSMENT:  CLINICAL IMPRESSION: Patient is doing better today and reports no L shoulder pain.   However, it could be because he has been resting and not doing anything over the holiday.   He still gets some discomfort with some of today's exercises, especially scapular clocks and high rows.   HE still has deficits with abduction strength with painful resisted L shoulder ABDuction.   Focused today's treatment on giving him a lot of new home exercises to continue with; esp stretching since he has tight pecs and anterior shoulder capsule.   He is advised to d/c any exercises that increase his pain.   He states he would like to try doing his exercises on his own and hold off on PT since he is feeling much better.   We will hold his chart for 30 days and D/C if he needs no further PT visits.   However, I do think he has deficits to address and would recommend continuing PT for weakness, stiffness, and pain with abduction in the L  shoulder.   We will wait to see if he calls for more appointments   EVAL: Patient is a 78 y.o. male who was evaluated today by physical therapy  for L shoulder impingement. He has been out of [place] for greater than one month, has  been taking naproxen  and performing exercises as prescribed by the referring MD, improved since his original PT referral.  Overall pain better, but still easily replicated today with abduction over 90 and with resisted L shoulder abd and ER.  We adapted his home exercise routine and he also is going [facility] [employer] and we will re assess next visit.    OBJECTIVE IMPAIRMENTS: decreased knowledge of condition, decreased ROM, decreased strength, hypomobility, increased fascial restrictions, impaired UE functional use, postural dysfunction, and pain.   ACTIVITY LIMITATIONS: lifting, sleeping, and reach over head  PARTICIPATION LIMITATIONS: meal prep, cleaning, laundry, and community activity  PERSONAL FACTORS: Age, Behavior pattern, Education, Time since onset of injury/illness/exacerbation, and 1-2 comorbidities: arthritis, DM are also affecting patient's functional outcome.   REHAB POTENTIAL: Good  CLINICAL DECISION MAKING: Evolving/moderate complexity  EVALUATION COMPLEXITY: Moderate   GOALS: Goals reviewed with patient? Yes  SHORT TERM GOALS: Target date: 2 weeks,03/31/24  I HEP Baseline: Goal status: INITIAL  LONG TERM GOALS: Target date: 8 weeks, 05/12/24  PSFS improve from 3.33 to 7 or greater Baseline:  Goal status: INITIAL  2.  Pain free and full active L shoulder abd Baseline:  Goal status: INITIAL  3.  Strength L shoulder ER , flex, abd wnl Baseline: see above chart Goal status: INITIAL    PLAN:  PT FREQUENCY: every other week  PT DURATION: 8 weeks  PLANNED INTERVENTIONS: 97110-Therapeutic exercises, 97530- Therapeutic activity, [id number]- Neuromuscular re-education, 97535- Self Care, [id number]- Manual therapy, and  Patient/Family education  PLAN FOR NEXT SESSION: Patient is going to try doing his HEP on his own and will schedule more visits only if pain returns.   If he needs further PT, then would reassess strength, ROM, add jt mobilizations as needed and other manual techniques, progress with therex   Kaylin Marcon, PT, DPT,OCS 04/05/2024, 10:11 AM  "

## 2024-04-05 NOTE — Therapy (Incomplete)
 " OUTPATIENT PHYSICAL THERAPY SHOULDER EVALUATION   Patient Name: George Alvarez MRN: [id number] DOB:[birth year], 78 y.o., male Today's Date: 04/05/2024  END OF SESSION:    Past Medical History:  Diagnosis Date   Allergy    seasonal   Anal fissure    Arthritis    Asthma, chronic 06/16/2007   Qualifier: Diagnosis of  By: Tish MD, Elsie   Onset:as child Triggers (environmental, infectious, allergic): all, mainly environmental triggers Rescue inhaler ldz:mjmzob Maintenance medications/ response:Singulair ,Qvar  Smoking history:never Family history pulmonary disease: no     Basal cell carcinoma of chest wall 2016   1.8 cm, treated with electrodessication and currettage   Benign paroxysmal positional vertigo 11/19/2012   Diagnosed at [facility] [place]  [facility]  Physical therapy appointment pending    BPH (benign prostatic hyperplasia)    With urinary obstruction   Coronary artery disease    a. 02/2012 Cath/PCI: LM 10, LAD min irregs, LCX large, OM1 sm, 40 ost, OM2 95p (5.0x16 Veriflex & 4.5x12 Veriflex BMS'), RCA 30p, 88m, 30d, PDA/PLA min irregs, EF 65%   Diabetes mellitus without complication (HCC)    Diverticulosis    Erectile dysfunction due to arterial insufficiency    Fundic gland polyps of stomach, benign 2010   GERD (gastroesophageal reflux disease)    History of elevated PSA    History of kidney stones    Hyperlipidemia    Hypertension    Nocturia    Perennial allergic rhinitis    Peyronie's disease    Skin cancer    Basal and squamous cell cancers, greater than 20   Stroke (HCC) 2016   Tubular adenoma of colon 2010   Past Surgical History:  Procedure Laterality Date   COLONOSCOPY  2016   COLONOSCOPY  06/2020   CORONARY ANGIOPLASTY WITH STENT PLACEMENT  02/06/2012   OM2  bare metal    EPIDURAL BLOCK INJECTION  04-2015   Dr Letha   epidural steroids  2007, 2009   X 2 @ cervical &, lumbar)   HERNIA REPAIR     INTRAVASCULAR ULTRASOUND  02/06/2012    Procedure: INTRAVASCULAR ULTRASOUND;  Surgeon: Lonni JONETTA Cash, MD;  Location: [facility] CATH LAB;  Service: Cardiovascular;;   LEFT HEART CATH AND CORONARY ANGIOGRAPHY N/A 09/30/2019   Procedure: LEFT HEART CATH AND CORONARY ANGIOGRAPHY;  Surgeon: Cash Lonni JONETTA, MD;  Location: [facility] INVASIVE CV LAB;  Service: Cardiovascular;  Laterality: N/A;   LEFT HEART CATHETERIZATION WITH CORONARY ANGIOGRAM N/A 02/06/2012   Procedure: LEFT HEART CATHETERIZATION WITH CORONARY ANGIOGRAM;  Surgeon: Lonni JONETTA Cash, MD;  Location: [facility] CATH LAB;  Service: Cardiovascular;  Laterality: N/A;   LUMBAR LAMINECTOMY/DECOMPRESSION MICRODISCECTOMY N/A 10/21/2017   Procedure: Lumbar Two-Three, Lumbar Four-Five Discectomy;  Surgeon: Gillie Duncans, MD;  Location: [facility] OR;  Service: Neurosurgery;  Laterality: N/A;   PERCUTANEOUS CORONARY STENT INTERVENTION (PCI-S)  02/06/2012   Procedure: PERCUTANEOUS CORONARY STENT INTERVENTION (PCI-S);  Surgeon: Lonni JONETTA Cash, MD;  Location: [facility] CATH LAB;  Service: Cardiovascular;;   septoplasty     TONSILLECTOMY AND ADENOIDECTOMY     TRIGGER FINGER RELEASE     UPPER GASTROINTESTINAL ENDOSCOPY  2011   gastric polyps   VASECTOMY     Patient Active Problem List   Diagnosis Date Noted   Other allergic rhinitis 04/28/2022   Severe persistent asthma without complication (HCC) 04/08/2022   Conjunctivitis 04/08/2022   TMJ (temporomandibular joint disorder) 09/17/2021   MRSA (methicillin resistant Staphylococcus aureus) infection 09/17/2021   Rash 08/16/2021   Diabetes  mellitus without complication (HCC) 04/03/2021   Intrinsic atopic dermatitis 11/22/2019   Chest pain of uncertain etiology    Viral upper respiratory tract infection with cough 07/06/2017   Current use of beta blocker 08/09/2015   Moderate persistent asthma without complication 02/20/2015   Seasonal and perennial allergic rhinitis 02/20/2015   PCP NOTES >>> 01/11/2015   Cerebellar infarct (HCC) 06/26/2014    Annual physical exam 02/20/2014   Benign paroxysmal positional vertigo 11/19/2012   CAD (coronary artery disease) 02/07/2012   Gastroesophageal reflux disease without esophagitis 01/01/2009   SKIN CANCER, HX OF 12/28/2007   Hyperlipidemia 06/16/2007   Essential hypertension 06/16/2007   Asthma, chronic 06/16/2007   Spinal stenosis 03/01/2007   NEPHROLITHIASIS, HX OF 05/14/2006    PCP: Amon Schanz, MD  REFERRING PROVIDER: Charles Redell LABOR, DO  REFERRING DIAG: L shoulder impingement  THERAPY DIAG:  No diagnosis found.  Rationale for Evaluation and Treatment: Rehabilitation  ONSET DATE:  11/2023  SUBJECTIVE:                                                                                                                                                                                      SUBJECTIVE STATEMENT: Reports he's been taking Naproxen  2 x a day and performing the ex from the doctor while on vacation in the last month.  Over the last week has reduced the naproxen  to once a day.  The combination of meds and ex have worked, pain much better L shoulder than it was when he saw Dr Charles   Hand dominance: Right  PERTINENT HISTORY: Progressive history of pain L shoulder primarily with abduction greater than shoulder height , also notes when [facility] [employer] [facility] [employer] that   PAIN:  Are you having pain? Pain L shoulder, superior jt line with abduction greater than 90, also notes when sleeping on L side with L arm under pillow his L arm will lock up   PRECAUTIONS: None  RED FLAGS: None   WEIGHT BEARING RESTRICTIONS: No  FALLS:  Has patient fallen in last 6 months? No  LIVING ENVIRONMENT: Lives with: lives with their spouse Lives in: House/apartment Stairs: No Has following equipment at home: None  OCCUPATION: retired  PLOF: Independent  PATIENT GOALS:able to manage L shoulder without pain  NEXT MD VISIT:   OBJECTIVE:  Note: Objective  measures were completed at Evaluation unless otherwise noted.  DIAGNOSTIC FINDINGS:  Mild AC arthritis and Type II vs Type lll acromion  PATIENT SURVEYS:  PSFS: THE PATIENT SPECIFIC FUNCTIONAL SCALE  Place score of 0-10 (0 = unable to perform activity and 10 = able to perform  activity at the same level as before injury or problem)  Activity Date: 03/17/14    Sleep on L side without pain 8    2. Abduct L shoulder past 90  0    3. Be able to perform fitness classes without L shoulder pain 2    4.      Total Score 10/3:3.33      Total Score = Sum of activity scores/number of activities  Minimally Detectable Change: 3 points (for single activity); 2 points (for average score)  [facility] (nd). The Patient Specific Functional Scale . Retrieved from [email or web address]   COGNITION: Overall cognitive status: Within functional limits for tasks assessed     SENSATION: WFL  POSTURE: Healthy male, mild increased thoracic kyphosis  UPPER EXTREMITY ROM:   Active/Passive ROM Right eval Left eval  Shoulder flexion 115 115/wnl  Shoulder extension  Wnl  Shoulder abduction  90/wnl  Shoulder adduction    Shoulder internal rotation  T8/70  Shoulder external rotation  To T2/68  Elbow flexion    Elbow extension    Wrist flexion    Wrist extension    Wrist ulnar deviation    Wrist radial deviation    Wrist pronation    Wrist supination    (Blank rows = wnl)  UPPER EXTREMITY MMT:  MMT Right eval Left eval  Shoulder flexion  Wnl p!  Shoulder extension    Shoulder abduction  4+p!  Shoulder adduction    Shoulder internal rotation    Shoulder external rotation  4+ , p!  Middle trapezius  3+ p!  Lower trapezius  5   Elbow flexion    Elbow extension    Wrist flexion    Wrist extension    Wrist ulnar deviation    Wrist radial deviation    Wrist pronation    Wrist supination    Grip strength (lbs)    (Blank  rows = wnl)   JOINT MOBILITY TESTING:  Decreased A/P humeral head mobility, normal scapular mobility  PALPATION:  Pt tender in muscle belly L supraspinatus and distal attachment proximal humeral head, non tender biceps non tender L infraspinatus and teres minor, subscapularis                                                                                                                             TREATMENT DATE: 03/17/24: Carlene. Instructed in 3 new exercises to improve mechanics, motor control of L shoulder and provide better spacing:  Pendulums to improve jt mobility, advised to utilize prior to his exercise sessions at [employer] over L shoulder extension with 5# Wall slides into B shoulder flexion with hands in pillow case with sustained shoulder horizontal abduction against pillow case     PATIENT EDUCATION: Education details: POC, goals Person educated: Patient Education method: Explanation, Demonstration, and Tactile cues Education comprehension: verbalized understanding, returned demonstration, verbal cues required, tactile cues required, and needs further education  HOME  EXERCISE PROGRAM: Access Code: [email or web address] URL: [email or web address] Date: 03/17/2024 Prepared by: Greig Credit  Exercises - Circular Shoulder Pendulum with Table Support  - 1 x daily - 7 x weekly - 3 sets - 10 reps - Bilateral Shoulder Flexion Wall Slide with Towel  - 1 x daily - 3 x weekly - 3 sets - 10 reps - Single Arm Bent Over Shoulder Extension with Dumbbell  - 1 x daily - 3 x weekly - 3 sets - 10 reps  ASSESSMENT:  CLINICAL IMPRESSION: Patient is a 78 y.o. male who was evaluated today by physical therapy  for L shoulder impingement. He has been out of [place] for greater than one month, has been taking naproxen  and performing exercises as prescribed by the referring MD, improved since his original PT referral.  Overall pain better, but still easily replicated today with abduction over 90 and  with resisted L shoulder abd and ER.  We adapted his home exercise routine and he also is going [facility] [employer] and we will re assess next visit.    OBJECTIVE IMPAIRMENTS: decreased knowledge of condition, decreased ROM, decreased strength, hypomobility, increased fascial restrictions, impaired UE functional use, postural dysfunction, and pain.   ACTIVITY LIMITATIONS: lifting, sleeping, and reach over head  PARTICIPATION LIMITATIONS: meal prep, cleaning, laundry, and community activity  PERSONAL FACTORS: Age, Behavior pattern, Education, Time since onset of injury/illness/exacerbation, and 1-2 comorbidities: arthritis, DM are also affecting patient's functional outcome.   REHAB POTENTIAL: Good  CLINICAL DECISION MAKING: Evolving/moderate complexity  EVALUATION COMPLEXITY: Moderate   GOALS: Goals reviewed with patient? Yes  SHORT TERM GOALS: Target date: 2 weeks,03/31/24  I HEP Baseline: Goal status: INITIAL  LONG TERM GOALS: Target date: 8 weeks, 05/12/24  PSFS improve from 3.33 to 7 or greater Baseline:  Goal status: INITIAL  2.  Pain free and full active L shoulder abd Baseline:  Goal status: INITIAL  3.  Strength L shoulder ER , flex, abd wnl Baseline: see above chart Goal status: INITIAL    PLAN:  PT FREQUENCY: every other week  PT DURATION: 8 weeks  PLANNED INTERVENTIONS: 97110-Therapeutic exercises, 97530- Therapeutic activity, 97112- Neuromuscular re-education, 97535- Self Care, [id number]- Manual therapy, and Patient/Family education  PLAN FOR NEXT SESSION: reassess strength, ROM, add jt mobilizations as needed and other manual techniques, progress with therex   Channie Bostick L Jalyssa Fleisher, PT, DPT,OCS 04/05/2024, 8:09 AM  "

## 2024-04-08 ENCOUNTER — Ambulatory Visit: Admitting: Medical

## 2024-04-08 VITALS — BP 128/78 | HR 65 | Temp 98.0°F | Resp 16 | Ht 70.0 in | Wt 234.8 lb

## 2024-04-08 DIAGNOSIS — J0100 Acute maxillary sinusitis, unspecified: Secondary | ICD-10-CM

## 2024-04-08 DIAGNOSIS — E119 Type 2 diabetes mellitus without complications: Secondary | ICD-10-CM | POA: Diagnosis not present

## 2024-04-08 DIAGNOSIS — J029 Acute pharyngitis, unspecified: Secondary | ICD-10-CM | POA: Diagnosis not present

## 2024-04-08 DIAGNOSIS — R051 Acute cough: Secondary | ICD-10-CM

## 2024-04-08 LAB — POC COVID19 BINAXNOW: SARS Coronavirus 2 Ag: NEGATIVE

## 2024-04-08 LAB — POCT RAPID STREP A (OFFICE): Rapid Strep A Screen: NEGATIVE

## 2024-04-08 LAB — POCT INFLUENZA A/B
Influenza A, POC: NEGATIVE
Influenza B, POC: NEGATIVE

## 2024-04-08 MED ORDER — BENZONATATE 100 MG PO CAPS: 100.0000 mg | ORAL_CAPSULE | Freq: Three times a day (TID) | ORAL | 0 refills | Status: AC | PRN

## 2024-04-08 MED ORDER — DOXYCYCLINE HYCLATE 100 MG PO TABS: 100.0000 mg | ORAL_TABLET | Freq: Two times a day (BID) | ORAL | 0 refills | Status: DC

## 2024-04-08 NOTE — Progress Notes (Signed)
 "  Subjective:    Patient ID: George Alvarez, male    DOB: 1945/12/30, 79 y.o.   MRN: [id number]  HPI FELIPE PALUCH is a 79 year old male with asthma who presents with a sore throat and sinus symptoms. Preceded by mild nasal congestion.  He has asthma and usually develops respiratory infections when exposed to others with head colds, typically treated with prednisone , albuterol , and azithromycin . After exposure to his granddaughters cold and flu he instead developed pain in left side of face, nose, and throat, consistent with a possible sinus infection.  He describes a sore, scraped sensation on the roof of his mouth and was concerned about strep or thrush from inhaler use. Rapid strep, flu, and COVID tests were negative. He has a history of MRSA skin infection and asked if this could be related.  He notes a slight wheeze and nighttime cough with mild congestion, but his asthma has not significantly flared and symptoms have not progressed to his chest.  He takes metformin  twice daily for diabetes and would like to check his blood sugar today. He was hospitalized for a staph infection about 15 years ago. He also takes his medications with food to limit gastrointestinal discomfort.      Review of Systems  Constitutional:  Negative for chills and fatigue.  HENT:  Positive for congestion, sinus pressure, sinus pain and sore throat. Negative for trouble swallowing.   Respiratory:  Positive for cough and wheezing. Negative for chest tightness and shortness of breath.        Minimal wheeze but no flare presently.  Cardiovascular:  Negative for chest pain and palpitations.  Gastrointestinal:  Negative for abdominal pain.  Genitourinary:  Negative for dysuria.  Musculoskeletal:  Negative for back pain, myalgias and neck stiffness.  Skin:  Negative for rash.  Neurological:  Negative for dizziness, weakness and light-headedness.  Hematological:  Negative for adenopathy.   Psychiatric/Behavioral:  Negative for behavioral problems and decreased concentration. The patient is not nervous/anxious.     Past Medical History:  Diagnosis Date   Allergy    seasonal   Anal fissure    Arthritis    Asthma, chronic 06/16/2007   Qualifier: Diagnosis of  By: Tish MD, Elsie   Onset:as child Triggers (environmental, infectious, allergic): all, mainly environmental triggers Rescue inhaler ldz:mjmzob Maintenance medications/ response:Singulair ,Qvar  Smoking history:never Family history pulmonary disease: no     Basal cell carcinoma of chest wall 2016   1.8 cm, treated with electrodessication and currettage   Benign paroxysmal positional vertigo 11/19/2012   Diagnosed at [facility]  Physical therapy appointment pending    BPH (benign prostatic hyperplasia)    With urinary obstruction   Coronary artery disease    a. 02/2012 Cath/PCI: LM 10, LAD min irregs, LCX large, OM1 sm, 40 ost, OM2 95p (5.0x16 Veriflex & 4.5x12 Veriflex BMS'), RCA 30p, 2m, 30d, PDA/PLA min irregs, EF 65%   Diabetes mellitus without complication (HCC)    Diverticulosis    Erectile dysfunction due to arterial insufficiency    Fundic gland polyps of stomach, benign 2010   GERD (gastroesophageal reflux disease)    History of elevated PSA    History of kidney stones    Hyperlipidemia    Hypertension    Nocturia    Perennial allergic rhinitis    Peyronie's disease    Skin cancer    Basal and squamous cell cancers, greater than 20   Stroke (HCC) 2016   Tubular  adenoma of colon 2010     Social History   Socioeconomic History   Marital status: Married    Spouse name: Not on file   Number of children: 3   Years of education: Not on file   Highest education level: Bachelor's degree (e.g., BA, AB, BS)  Occupational History   Occupation: firefighter   Tobacco Use   Smoking status: Never   Smokeless tobacco: Never  Vaping Use   Vaping status: Never Used   Substance and Sexual Activity   Alcohol use: Not Currently    Comment: Wine occasionally   Drug use: No   Sexual activity: Yes    Partners: Female  Other Topics Concern   Not on file  Social History Narrative   3 daughters, 5 g-children   lifes w/ wife in [place]   Right handed   Two story home   Mother in her 44s, lives in [place]   Social Drivers of Health   Tobacco Use: Low Risk (04/05/2024)   Patient History    Smoking Tobacco Use: Never    Smokeless Tobacco Use: Never    Passive Exposure: Not on file  Financial Resource Strain: Low Risk (01/18/2024)   Overall Financial Resource Strain (CARDIA)    Difficulty of Paying Living Expenses: Not very hard  Food Insecurity: No Food Insecurity (01/18/2024)   Epic    Worried About Programme Researcher, Broadcasting/film/video in the Last Year: Never true    Ran Out of Food in the Last Year: Never true  Transportation Needs: No Transportation Needs (01/18/2024)   Epic    Lack of Transportation (Medical): No    Lack of Transportation (Non-Medical): No  Physical Activity: Sufficiently Active (01/25/2024)   Exercise Vital Sign    Days of Exercise per Week: 3 days    Minutes of Exercise per Session: 50 min  Stress: No Stress Concern Present (01/18/2024)   [employer] of Occupational Health - Occupational Stress Questionnaire    Feeling of Stress: Only a little  Social Connections: Moderately Integrated (01/25/2024)   Social Connection and Isolation Panel    Frequency of Communication with Friends and Family: More than three times a week    Frequency of Social Gatherings with Friends and Family: Twice a week    Attends Religious Services: Never    Database Administrator or Organizations: Yes    Attends Engineer, Structural: More than 4 times per year    Marital Status: Married  Catering Manager Violence: Not At Risk (01/25/2024)   Epic    Fear of Current or Ex-Partner: No    Emotionally Abused: No    Physically Abused: No    Sexually  Abused: No  Depression (PHQ2-9): Low Risk (01/25/2024)   Depression (PHQ2-9)    PHQ-2 Score: 2  Alcohol Screen: Low Risk (01/18/2024)   Alcohol Screen    Last Alcohol Screening Score (AUDIT): 2  Housing: Low Risk (01/25/2024)   Epic    Unable to Pay for Housing in the Last Year: No    Number of Times Moved in the Last Year: 0    Homeless in the Last Year: No  Utilities: Not At Risk (01/25/2024)   Epic    Threatened with loss of utilities: No  Health Literacy: Adequate Health Literacy (01/25/2024)   B1300 Health Literacy    Frequency of need for help with medical instructions: Never    Past Surgical History:  Procedure Laterality Date   COLONOSCOPY  2016  COLONOSCOPY  06/2020   CORONARY ANGIOPLASTY WITH STENT PLACEMENT  02/06/2012   OM2  bare metal    EPIDURAL BLOCK INJECTION  04-2015   Dr Letha   epidural steroids  2007, 2009   X 2 @ cervical &, lumbar)   HERNIA REPAIR     INTRAVASCULAR ULTRASOUND  02/06/2012   Procedure: INTRAVASCULAR ULTRASOUND;  Surgeon: Lonni JONETTA Cash, MD;  Location: [facility] CATH LAB;  Service: Cardiovascular;;   LEFT HEART CATH AND CORONARY ANGIOGRAPHY N/A 09/30/2019   Procedure: LEFT HEART CATH AND CORONARY ANGIOGRAPHY;  Surgeon: Cash Lonni JONETTA, MD;  Location: [facility] INVASIVE CV LAB;  Service: Cardiovascular;  Laterality: N/A;   LEFT HEART CATHETERIZATION WITH CORONARY ANGIOGRAM N/A 02/06/2012   Procedure: LEFT HEART CATHETERIZATION WITH CORONARY ANGIOGRAM;  Surgeon: Lonni JONETTA Cash, MD;  Location: [facility] CATH LAB;  Service: Cardiovascular;  Laterality: N/A;   LUMBAR LAMINECTOMY/DECOMPRESSION MICRODISCECTOMY N/A 10/21/2017   Procedure: Lumbar Two-Three, Lumbar Four-Five Discectomy;  Surgeon: Gillie Duncans, MD;  Location: [facility] OR;  Service: Neurosurgery;  Laterality: N/A;   PERCUTANEOUS CORONARY STENT INTERVENTION (PCI-S)  02/06/2012   Procedure: PERCUTANEOUS CORONARY STENT INTERVENTION (PCI-S);  Surgeon: Lonni JONETTA Cash, MD;  Location[facility]  CATH LAB;  Service: Cardiovascular;;   septoplasty     TONSILLECTOMY AND ADENOIDECTOMY     TRIGGER FINGER RELEASE     UPPER GASTROINTESTINAL ENDOSCOPY  2011   gastric polyps   VASECTOMY      Family History  Problem Relation Age of Onset   Heart disease Mother    Breast cancer Mother    Esophageal cancer Mother        Barrett's   Heart attack Father 58       died @ 86   Breast cancer Maternal Aunt    Heart attack Paternal Uncle 67   Heart attack Maternal Grandmother        after 32   Dementia Maternal Grandmother        CVA   Breast cancer Maternal Grandmother    Diabetes Maternal Grandmother    Stroke Maternal Grandmother        in 69s   Colon cancer Neg Hx    Liver disease Neg Hx     Allergies[1]  Medications Ordered Prior to Encounter[2]  BP 128/78   Pulse 65   Temp 98 F (36.7 C) (Oral)   Resp 16   Ht 5' 10 (1.778 m)   Wt 234 lb 12.8 oz (106.5 kg)   SpO2 97%   BMI 33.69 kg/m          Objective:   Physical Exam  General Mental Status- Alert. General Appearance- Not in acute distress.   Skin General: Color- Normal Color. Moisture- Normal Moisture.  Neck No JVD.  Chest and Lung Exam Auscultation: Breath Sounds:-CTA  Cardiovascular Auscultation:Rythm- RRR Murmurs & Other Heart Sounds:Auscultation of the heart reveals- No Murmurs.  Abdomen Inspection:-Inspeection Normal. Palpation/Percussion:Note:No mass. Palpation and Percussion of the abdomen reveal- Non Tender, Non Distended + BS, no rebound or guarding.    Neurologic Cranial Nerve exam:- CN III-XII intact(No nystagmus), symmetric smile. Strength:- 5/5 equal and symmetric strength both upper and lower extremities.   Heent- left frontal and maxillary sinus pressure. Ears- canal parially bloked with wax.Portions of tm seen bilaterally normal.    Assessment & Plan:   Acute maxillary sinusitis with pharyngitis and aphthous ulcer Acute maxillary sinusitis with pharyngitis and aphthous  ulcer. Differential includes viral infection and MRSA flare-up. Negative rapid strep, flu, and  COVID tests. Doxycycline  chosen for its broad coverage. - Prescribed doxycycline  twice a day for 10 days. - Advised to stop multivitamin temporarily while on doxycycline . - Instructed to ensure food intake before taking doxycycline .  Moderate persistent asthma Moderate persistent asthma with mild wheezing and nighttime cough. Lungs clear, oxygen saturation 97%. Discussed potential for flare-up with weather changes or stress. Fasenra  injection scheduled, advised to confirm with asthma doctor due to antibiotic treatment. - Continue Breztri  and albuterol  as needed. - Monitor wheezing and report if it worsens. - Confirm Fasenra  injection with asthma doctor.  Type 2 diabetes mellitus Recent A1c in diabetic range. Discussed importance of blood sugar control, especially if steroid treatment is needed for asthma flare-up. - Ordered metabolic panel and A1c. - Advised to monitor blood sugar levels daily if wheezing worsens. - Instructed to report if blood sugar levels exceed 200 mg/dL.  History of MRSA skin infection History of MRSA skin infection. Current symptoms could be related to MRSA flare-up. Doxycycline  chosen for its coverage of community-acquired MRSA. - Monitor for signs of MRSA flare-up. - Ensure doxycycline  is taken as prescribed.  Follow up in 10 days or sooner if needed    [1]  Allergies Allergen Reactions   Nifedipine Dermatitis and Rash    edema  [2]  Current Outpatient Medications on File Prior to Visit  Medication Sig Dispense Refill   albuterol  (VENTOLIN  HFA) 108 (90 Base) MCG/ACT inhaler Inhale 2 puffs into the lungs every 4 (four) hours as needed for wheezing or shortness of breath. 18 g 1   aspirin  EC 81 MG tablet Take 1 tablet (81 mg total) by mouth daily. 90 tablet 3   azelastine  (ASTELIN ) 0.1 % nasal spray Place 2 sprays into both nostrils 2 (two) times daily. Use in each  nostril as directed 30 mL 5   benralizumab  (FASENRA  PEN) 30 MG/ML prefilled autoinjector Inject 1 mL (30 mg total) into the skin every 8 (eight) weeks. 1 mL 6   Blood Glucose Monitoring Suppl (ONETOUCH VERIO) w/Device KIT Check blood sugars once daily 1 kit 0   budesonide -glycopyrrolate-formoterol  (BREZTRI  AEROSPHERE) 160-9-4.8 MCG/ACT AERO inhaler Inhale 2 puffs into the lungs in the morning and at bedtime. 1 each 4   cetirizine (ZYRTEC) 10 MG tablet Take 10 mg by mouth daily.     chlorhexidine  (HIBICLENS ) 4 % external liquid Apply topically daily as needed. 120 mL 0   EPINEPHrine  (EPIPEN  2-PAK) 0.3 mg/0.3 mL IJ SOAJ injection Use as directed for severe allergic reactions 2 each 3   fluticasone  (FLONASE ) 50 MCG/ACT nasal spray SPRAY 2 SPRAYS INTO EACH NOSTRIL EVERY DAY 48 mL 1   glucose blood test strip Check blood sugars once daily 100 each 12   hydrocortisone  2.5 % cream Apply 1 application topically daily as needed (Eczema).     ketoconazole  (NIZORAL ) 2 % shampoo Apply 1 application topically once a week.     Lancets (ONETOUCH ULTRASOFT) lancets Check blood sugars once daily 100 each 12   metFORMIN  (GLUCOPHAGE -XR) 500 MG 24 hr tablet Take 2 tablets (1,000 mg total) by mouth daily with breakfast. 180 tablet 1   metoprolol  tartrate (LOPRESSOR ) 25 MG tablet Take 0.5 tablets (12.5 mg total) by mouth 2 (two) times daily. 90 tablet 1   montelukast  (SINGULAIR ) 10 MG tablet TAKE 1 TABLET BY MOUTH EVERYDAY AT BEDTIME 90 tablet 1   Multiple Vitamin (MULTIVITAMIN WITH MINERALS) TABS tablet Take 1 tablet by mouth daily.     mupirocin  ointment (BACTROBAN ) 2 % Apply  1 Application topically 2 (two) times daily. 22 g 0   naproxen  (NAPROSYN ) 500 MG tablet TAKE 1 TABLET BY MOUTH 2 TIMES DAILY WITH A MEAL. 30 tablet 0   olmesartan -hydrochlorothiazide  (BENICAR  HCT) 20-12.5 MG tablet Take 1 tablet by mouth daily. 90 tablet 1   Omega-3 Fatty Acids (FISH OIL) 1200 MG CAPS Take 1,200 mg by mouth daily.      pantoprazole  (PROTONIX ) 40 MG tablet TAKE 1 TABLET BY MOUTH TWICE A DAY 180 tablet 2   rosuvastatin  (CRESTOR ) 40 MG tablet Take 1 tablet (40 mg total) by mouth daily. 90 tablet 1   tadalafil  (CIALIS ) 5 MG tablet Take 5 mg by mouth daily as needed.     tamsulosin  (FLOMAX ) 0.4 MG CAPS capsule Take 1 capsule (0.4 mg total) by mouth daily. 15 capsule 0   triamcinolone  cream (KENALOG ) 0.1 % Apply 1 application  topically daily as needed (Eczema).     Current Facility-Administered Medications on File Prior to Visit  Medication Dose Route Frequency Provider Last Rate Last Admin   Benralizumab  SOSY 30 mg  30 mg Subcutaneous Q8 Weeks Lomasney, Evelyn, MD   30 mg at 02/15/24 1139   "

## 2024-04-08 NOTE — Patient Instructions (Signed)
 Acute maxillary sinusitis with pharyngitis and aphthous ulcer Acute maxillary sinusitis with pharyngitis and aphthous ulcer. Differential includes viral infection and MRSA flare-up. Negative rapid strep, flu, and COVID tests. Doxycycline  chosen for its broad coverage. - Prescribed doxycycline  twice a day for 10 days. - Advised to stop multivitamin temporarily while on doxycycline . - Instructed to ensure food intake before taking doxycycline .  Moderate persistent asthma Moderate persistent asthma with mild wheezing and nighttime cough. Lungs clear, oxygen saturation 97%. Discussed potential for flare-up with weather changes or stress. Fasenra  injection scheduled, advised to confirm with asthma doctor due to antibiotic treatment. - Continue Breztri  and albuterol  as needed. - Monitor wheezing and report if it worsens. - Confirm Fasenra  injection with asthma doctor.  Type 2 diabetes mellitus Recent A1c in diabetic range. Discussed importance of blood sugar control, especially if steroid treatment is needed for asthma flare-up. - Ordered metabolic panel and A1c. - Advised to monitor blood sugar levels daily if wheezing worsens. - Instructed to report if blood sugar levels exceed 200 mg/dL.  History of MRSA skin infection History of MRSA skin infection. Current symptoms could be related to MRSA flare-up. Doxycycline  chosen for its coverage of community-acquired MRSA. - Monitor for signs of MRSA flare-up. - Ensure doxycycline  is taken as prescribed.  Follow up in 10 days or sooner if needed

## 2024-04-09 ENCOUNTER — Encounter: Payer: Self-pay | Admitting: Internal Medicine

## 2024-04-11 ENCOUNTER — Ambulatory Visit

## 2024-04-11 NOTE — Telephone Encounter (Signed)
 My chart message sent. Yes he can have his biologic while on antibiotics or sick. Per Dr Lorin.

## 2024-04-21 ENCOUNTER — Ambulatory Visit

## 2024-04-21 ENCOUNTER — Ambulatory Visit: Admitting: Medical

## 2024-04-21 VITALS — BP 116/80 | HR 67 | Temp 97.6°F | Resp 15 | Ht 70.0 in | Wt 232.6 lb

## 2024-04-21 DIAGNOSIS — J0100 Acute maxillary sinusitis, unspecified: Secondary | ICD-10-CM | POA: Diagnosis not present

## 2024-04-21 DIAGNOSIS — J4550 Severe persistent asthma, uncomplicated: Secondary | ICD-10-CM

## 2024-04-21 DIAGNOSIS — J40 Bronchitis, not specified as acute or chronic: Secondary | ICD-10-CM

## 2024-04-21 DIAGNOSIS — J4520 Mild intermittent asthma, uncomplicated: Secondary | ICD-10-CM | POA: Diagnosis not present

## 2024-04-21 DIAGNOSIS — J029 Acute pharyngitis, unspecified: Secondary | ICD-10-CM

## 2024-04-21 NOTE — Patient Instructions (Signed)
 Mild intermittent asthma Well-controlled with  breztri , albuterol  and Fasenra . No wheezing. Oxygen saturation stable at 97-98%.  Resolved sinus infection, pharyngitis and bronchitis. No further treatment needed  Follow up with pcp as regularly scheduled.

## 2024-04-21 NOTE — Progress Notes (Signed)
" ° °  Subjective:    Patient ID: George Alvarez, male    DOB: 04-22-1945, 79 y.o.   MRN: [id number]  HPI  George Alvarez is a 79 year old male with asthma who presents for follow-up after a sinus infection, pharyngitis and bronchitis like symptoms.SABRA  He recently completed a 10-day course of doxycycline  for a sinus infection with sore throat and a mouth ulcer. The ulcer resolved in about a week, symptoms lasted about two weeks, and he now feels close to baseline.  Pt note st and cough symptoms resolved along with sinus symptoms.  He has asthma treated with Breztri  and  (albuterol ). Wheezing has resolved.  He typically has asthma flares about once a year, but he has not had any flares since starting Fasenra .   Review of Systems See hpi    Objective:   Physical Exam  General- No acute distress. Pleasant patient. Neck- Full range of motion, no jvd Lungs- Clear, even and unlabored. Heart- regular rate and rhythm. Neurologic- CNII- XII grossly intact.  Heent- no sinus pressure. Tm normal but some wax present. Normal pharynx.      Assessment & Plan:   Patient Instructions  Mild intermittent asthma Well-controlled with  breztri , albuterol  and Fasenra . No wheezing. Oxygen saturation stable at 97-98%.  Resolved sinus infection, pharyngitis and bronchitis. No further treatment needed  Follow up with pcp as regularly scheduled.   Dallas Maxwell, PA-C  "

## 2024-04-24 NOTE — Progress Notes (Unsigned)
 "   No chief complaint on file.  History of Present Illness: 79 yo male with history of HTN, HLD, PVCs and CAD who is here today for follow up. Cardiac cath on 02/06/12 with 95% proximal OM2 lesion and a 60% mid RCA lesion. EF was 65%. His OM2 was very large in caliber and was treated with bare-metal stents x 2. Medical management of RCA stenosis which was not felt to be flow limiting. He has chronic dizziness and is felt to have vertigo. Normal stress myoview  December 2015. He has been limited by chronic back pain due to spinal stenosis but following back surgery in July 2019 his pain improved. Plavix  was stopped in January 2021 and ASA was started. Echo January 2021 with LVEF=60-65%. No significant valve disease. Possible aortic root dilatation. Follow up chest CTA with no evidence of dilation of the ascending aorta. He was seen in our office June 2021 by Katheryn Jude, NP and reported dyspnea and chest pain in the setting of an upper respiratory infection. Cardiac cath June 2021 with patent obtuse marginal stents, mild LAD disease and moderate RCA disease which was unchanged and not felt to be flow limiting. Normal LV systolic function. Nuclear stress test in April 2024 with no ischemia.   He is here today for follow up. The patient denies any chest pain, dyspnea, palpitations, lower extremity edema, orthopnea, PND, dizziness, near syncope or syncope.   Primary Care Physician: Amon Aloysius BRAVO, MD  Past Medical History:  Diagnosis Date   Allergy    seasonal   Anal fissure    Arthritis    Asthma, chronic 06/16/2007   Qualifier: Diagnosis of  By: Tish MD, Elsie   Onset:as child Triggers (environmental, infectious, allergic): all, mainly environmental triggers Rescue inhaler ldz:mjmzob Maintenance medications/ response:Singulair ,Qvar  Smoking history:never Family history pulmonary disease: no     Basal cell carcinoma of chest wall 2016   1.8 cm, treated with electrodessication and currettage    Benign paroxysmal positional vertigo 11/19/2012   Diagnosed at [facility], [place]  [facility]  Physical therapy appointment pending    BPH (benign prostatic hyperplasia)    With urinary obstruction   Coronary artery disease    a. 02/2012 Cath/PCI: LM 10, LAD min irregs, LCX large, OM1 sm, 40 ost, OM2 95p (5.0x16 Veriflex & 4.5x12 Veriflex BMS'), RCA 30p, 60m, 30d, PDA/PLA min irregs, EF 65%   Diabetes mellitus without complication (HCC)    Diverticulosis    Erectile dysfunction due to arterial insufficiency    Fundic gland polyps of stomach, benign 2010   GERD (gastroesophageal reflux disease)    History of elevated PSA    History of kidney stones    Hyperlipidemia    Hypertension    Nocturia    Perennial allergic rhinitis    Peyronie's disease    Skin cancer    Basal and squamous cell cancers, greater than 20   Stroke (HCC) 2016   Tubular adenoma of colon 2010    Past Surgical History:  Procedure Laterality Date   COLONOSCOPY  2016   COLONOSCOPY  06/2020   CORONARY ANGIOPLASTY WITH STENT PLACEMENT  02/06/2012   OM2  bare metal    EPIDURAL BLOCK INJECTION  04-2015   Dr Letha   epidural steroids  2007, 2009   X 2 @ cervical &, lumbar)   HERNIA REPAIR     INTRAVASCULAR ULTRASOUND  02/06/2012   Procedure: INTRAVASCULAR ULTRASOUND;  Surgeon: Lonni JONETTA Cash, MD;  Location: [facility] CATH LAB;  Service: Cardiovascular;;   LEFT HEART CATH AND CORONARY ANGIOGRAPHY N/A 09/30/2019   Procedure: LEFT HEART CATH AND CORONARY ANGIOGRAPHY;  Surgeon: Verlin Lonni BIRCH, MD;  Location: [facility] INVASIVE CV LAB;  Service: Cardiovascular;  Laterality: N/A;   LEFT HEART CATHETERIZATION WITH CORONARY ANGIOGRAM N/A 02/06/2012   Procedure: LEFT HEART CATHETERIZATION WITH CORONARY ANGIOGRAM;  Surgeon: Lonni BIRCH Verlin, MD;  Location: [facility] CATH LAB;  Service: Cardiovascular;  Laterality: N/A;   LUMBAR LAMINECTOMY/DECOMPRESSION MICRODISCECTOMY N/A 10/21/2017   Procedure: Lumbar Two-Three, Lumbar  Four-Five Discectomy;  Surgeon: Gillie Duncans, MD;  Location[facility];  Service: Neurosurgery;  Laterality: N/A;   PERCUTANEOUS CORONARY STENT INTERVENTION (PCI-S)  02/06/2012   Procedure: PERCUTANEOUS CORONARY STENT INTERVENTION (PCI-S);  Surgeon: Lonni BIRCH Verlin, MD;  Location: [facility] CATH LAB;  Service: Cardiovascular;;   septoplasty     TONSILLECTOMY AND ADENOIDECTOMY     TRIGGER FINGER RELEASE     UPPER GASTROINTESTINAL ENDOSCOPY  2011   gastric polyps   VASECTOMY      Current Outpatient Medications  Medication Sig Dispense Refill   albuterol  (VENTOLIN  HFA) 108 (90 Base) MCG/ACT inhaler Inhale 2 puffs into the lungs every 4 (four) hours as needed for wheezing or shortness of breath. 18 g 1   aspirin  EC 81 MG tablet Take 1 tablet (81 mg total) by mouth daily. 90 tablet 3   azelastine  (ASTELIN ) 0.1 % nasal spray Place 2 sprays into both nostrils 2 (two) times daily. Use in each nostril as directed 30 mL 5   benralizumab  (FASENRA  PEN) 30 MG/ML prefilled autoinjector Inject 1 mL (30 mg total) into the skin every 8 (eight) weeks. 1 mL 6   benzonatate  (TESSALON ) 100 MG capsule Take 1 capsule (100 mg total) by mouth 3 (three) times daily as needed for cough. 30 capsule 0   Blood Glucose Monitoring Suppl (ONETOUCH VERIO) w/Device KIT Check blood sugars once daily 1 kit 0   budesonide -glycopyrrolate-formoterol  (BREZTRI  AEROSPHERE) 160-9-4.8 MCG/ACT AERO inhaler Inhale 2 puffs into the lungs in the morning and at bedtime. 1 each 4   cetirizine (ZYRTEC) 10 MG tablet Take 10 mg by mouth daily.     chlorhexidine  (HIBICLENS ) 4 % external liquid Apply topically daily as needed. 120 mL 0   doxycycline  (VIBRA -TABS) 100 MG tablet Take 1 tablet (100 mg total) by mouth 2 (two) times daily. 20 tablet 0   EPINEPHrine  (EPIPEN  2-PAK) 0.3 mg/0.3 mL IJ SOAJ injection Use as directed for severe allergic reactions 2 each 3   fluticasone  (FLONASE ) 50 MCG/ACT nasal spray SPRAY 2 SPRAYS INTO EACH NOSTRIL EVERY DAY 48  mL 1   glucose blood test strip Check blood sugars once daily 100 each 12   hydrocortisone  2.5 % cream Apply 1 application topically daily as needed (Eczema).     ketoconazole  (NIZORAL ) 2 % shampoo Apply 1 application topically once a week.     Lancets (ONETOUCH ULTRASOFT) lancets Check blood sugars once daily 100 each 12   metFORMIN  (GLUCOPHAGE -XR) 500 MG 24 hr tablet Take 2 tablets (1,000 mg total) by mouth daily with breakfast. 180 tablet 1   metoprolol  tartrate (LOPRESSOR ) 25 MG tablet Take 0.5 tablets (12.5 mg total) by mouth 2 (two) times daily. 90 tablet 1   montelukast  (SINGULAIR ) 10 MG tablet TAKE 1 TABLET BY MOUTH EVERYDAY AT BEDTIME 90 tablet 1   Multiple Vitamin (MULTIVITAMIN WITH MINERALS) TABS tablet Take 1 tablet by mouth daily.     mupirocin  ointment (BACTROBAN ) 2 % Apply 1 Application topically 2 (  two) times daily. 22 g 0   naproxen  (NAPROSYN ) 500 MG tablet TAKE 1 TABLET BY MOUTH 2 TIMES DAILY WITH A MEAL. 30 tablet 0   olmesartan -hydrochlorothiazide  (BENICAR  HCT) 20-12.5 MG tablet Take 1 tablet by mouth daily. 90 tablet 1   Omega-3 Fatty Acids (FISH OIL) 1200 MG CAPS Take 1,200 mg by mouth daily.     pantoprazole  (PROTONIX ) 40 MG tablet TAKE 1 TABLET BY MOUTH TWICE A DAY 180 tablet 2   rosuvastatin  (CRESTOR ) 40 MG tablet Take 1 tablet (40 mg total) by mouth daily. 90 tablet 1   tadalafil  (CIALIS ) 5 MG tablet Take 5 mg by mouth daily as needed.     tamsulosin  (FLOMAX ) 0.4 MG CAPS capsule Take 1 capsule (0.4 mg total) by mouth daily. 15 capsule 0   triamcinolone  cream (KENALOG ) 0.1 % Apply 1 application  topically daily as needed (Eczema).     Current Facility-Administered Medications  Medication Dose Route Frequency Provider Last Rate Last Admin   Benralizumab  SOSY 30 mg  30 mg Subcutaneous Q8 Weeks Lomasney, Evelyn, MD   30 mg at 04/21/24 1123    Allergies  Allergen Reactions   Nifedipine Dermatitis and Rash    edema    Social History   Socioeconomic History    Marital status: Married    Spouse name: Not on file   Number of children: 3   Years of education: Not on file   Highest education level: Bachelor's degree (e.g., BA, AB, BS)  Occupational History   Occupation: firefighter   Tobacco Use   Smoking status: Never   Smokeless tobacco: Never  Vaping Use   Vaping status: Never Used  Substance and Sexual Activity   Alcohol use: Not Currently    Comment: Wine occasionally   Drug use: No   Sexual activity: Yes    Partners: Female  Other Topics Concern   Not on file  Social History Narrative   3 daughters, 5 g-children   lifes w/ wife in [place]   Right handed   Two story home   Mother in her 64s, lives in [place]   Social Drivers of Health   Tobacco Use: Low Risk (04/05/2024)   Patient History    Smoking Tobacco Use: Never    Smokeless Tobacco Use: Never    Passive Exposure: Not on file  Financial Resource Strain: Low Risk (01/18/2024)   Overall Financial Resource Strain (CARDIA)    Difficulty of Paying Living Expenses: Not very hard  Food Insecurity: No Food Insecurity (01/18/2024)   Epic    Worried About Programme Researcher, Broadcasting/film/video in the Last Year: Never true    Ran Out of Food in the Last Year: Never true  Transportation Needs: No Transportation Needs (01/18/2024)   Epic    Lack of Transportation (Medical): No    Lack of Transportation (Non-Medical): No  Physical Activity: Sufficiently Active (01/25/2024)   Exercise Vital Sign    Days of Exercise per Week: 3 days    Minutes of Exercise per Session: 50 min  Stress: No Stress Concern Present (01/18/2024)   [employer] of Occupational Health - Occupational Stress Questionnaire    Feeling of Stress: Only a little  Social Connections: Moderately Integrated (01/25/2024)   Social Connection and Isolation Panel    Frequency of Communication with Friends and Family: More than three times a week    Frequency of Social Gatherings with Friends and Family: Twice a  week    Attends Religious Services:  Never    Active Member of Clubs or Organizations: Yes    Attends Club or Organization Meetings: More than 4 times per year    Marital Status: Married  Intimate Partner Violence: Not At Risk (01/25/2024)   Epic    Fear of Current or Ex-Partner: No    Emotionally Abused: No    Physically Abused: No    Sexually Abused: No  Depression (PHQ2-9): Low Risk (01/25/2024)   Depression (PHQ2-9)    PHQ-2 Score: 2  Alcohol Screen: Low Risk (01/18/2024)   Alcohol Screen    Last Alcohol Screening Score (AUDIT): 2  Housing: Low Risk (01/25/2024)   Epic    Unable to Pay for Housing in the Last Year: No    Number of Times Moved in the Last Year: 0    Homeless in the Last Year: No  Utilities: Not At Risk (01/25/2024)   Epic    Threatened with loss of utilities: No  Health Literacy: Adequate Health Literacy (01/25/2024)   B1300 Health Literacy    Frequency of need for help with medical instructions: Never    Family History  Problem Relation Age of Onset   Heart disease Mother    Breast cancer Mother    Esophageal cancer Mother        Barrett's   Heart attack Father 57       died @ 46   Breast cancer Maternal Aunt    Heart attack Paternal Uncle 77   Heart attack Maternal Grandmother        after 45   Dementia Maternal Grandmother        CVA   Breast cancer Maternal Grandmother    Diabetes Maternal Grandmother    Stroke Maternal Grandmother        in 82s   Colon cancer Neg Hx    Liver disease Neg Hx     Review of Systems:  As stated in the HPI and otherwise negative.   There were no vitals taken for this visit.  Physical Examination: General: Well developed, well nourished, NAD  SKIN: warm, dry. Neuro: No focal deficits  Psychiatric: Mood and affect normal  Neck: No JVD Lungs:Clear bilaterally, no wheezes, rhonci, crackles Cardiovascular: Regular rate and rhythm. No murmurs, gallops or rubs. Abdomen:Soft.  Extremities: No lower extremity  edema.    EKG:  EKG is not *** ordered today. The ekg ordered today demonstrates   Recent Labs: 08/24/2023: ALT 19; Hemoglobin 14.3; Platelets 143.0 01/25/2024: BUN 19; Creatinine, Ser 1.24; Potassium 4.0; Sodium 140   Lipid Panel    Component Value Date/Time   CHOL 109 01/25/2024 1323   TRIG 277.0 (H) 01/25/2024 1323   HDL 34.80 (L) 01/25/2024 1323   CHOLHDL 3 01/25/2024 1323   VLDL 55.4 (H) 01/25/2024 1323   LDLCALC 19 01/25/2024 1323   LDLDIRECT 49.0 08/20/2022 1337     Wt Readings from Last 3 Encounters:  04/21/24 232 lb 9.6 oz (105.5 kg)  04/08/24 234 lb 12.8 oz (106.5 kg)  02/12/24 200 lb (90.7 kg)    Assessment and Plan:   1. CAD without angina: Cardiac cath June 2021 with stable CAD. Nuclear stress April 2024 with no ischemia. No chest pain.  -Continue ASA, Crestor  and Lopressor . .    2. HTN: BP is well controlled.  -Continue Lopressor  and Benicar /HCTZ  3. Hyperlipidemia: LDL ***.  -Continue Crestor   Labs/ tests ordered today include:  No orders of the defined types were placed in this encounter.  Disposition:  F/U with me in 12  months  Signed, Lonni Cash, MD 04/24/2024 5:29 PM    [facility] [address] [place], [place], [place]  [address] Phone: [phone]; Fax: [phone]  "

## 2024-04-25 ENCOUNTER — Encounter: Payer: Self-pay | Admitting: Cardiovascular Disease

## 2024-04-25 ENCOUNTER — Ambulatory Visit: Attending: Cardiovascular Disease | Admitting: Cardiovascular Disease

## 2024-04-25 VITALS — BP 122/70 | HR 75 | Ht 70.0 in | Wt 229.9 lb

## 2024-04-25 DIAGNOSIS — E7800 Pure hypercholesterolemia, unspecified: Secondary | ICD-10-CM

## 2024-04-25 DIAGNOSIS — I10 Essential (primary) hypertension: Secondary | ICD-10-CM

## 2024-04-25 DIAGNOSIS — I2510 Atherosclerotic heart disease of native coronary artery without angina pectoris: Secondary | ICD-10-CM

## 2024-04-25 NOTE — Patient Instructions (Signed)
 Medication Instructions:  Your physician recommends that you continue on your current medications as directed. Please refer to the Current Medication list given to you today.  *If you need a refill on your cardiac medications before your next appointment, please call your pharmacy*   Follow-Up: At [facility], you and your health needs are our priority.  As part of our continuing mission to provide you with exceptional heart care, our providers are all part of one team.  This team includes your primary Cardiologist (physician) and Advanced Practice Providers or APPs (Physician Assistants and Nurse Practitioners) who all work together to provide you with the care you need, when you need it.  Your next appointment:   1 year(s)  Provider:   Lonni Cash, MD

## 2024-04-28 ENCOUNTER — Encounter: Payer: Self-pay | Admitting: Podiatry

## 2024-04-28 ENCOUNTER — Ambulatory Visit: Admitting: Podiatry

## 2024-04-28 DIAGNOSIS — L02619 Cutaneous abscess of unspecified foot: Secondary | ICD-10-CM

## 2024-04-28 DIAGNOSIS — B351 Tinea unguium: Secondary | ICD-10-CM

## 2024-04-28 DIAGNOSIS — M79675 Pain in left toe(s): Secondary | ICD-10-CM

## 2024-04-28 DIAGNOSIS — E119 Type 2 diabetes mellitus without complications: Secondary | ICD-10-CM

## 2024-04-28 DIAGNOSIS — M79674 Pain in right toe(s): Secondary | ICD-10-CM | POA: Diagnosis not present

## 2024-04-28 DIAGNOSIS — L03119 Cellulitis of unspecified part of limb: Secondary | ICD-10-CM | POA: Diagnosis not present

## 2024-04-28 MED ORDER — DOXYCYCLINE HYCLATE 100 MG PO TABS: 100.0000 mg | ORAL_TABLET | Freq: Two times a day (BID) | ORAL | 0 refills | Status: AC

## 2024-04-28 NOTE — Progress Notes (Addendum)
 "  Subjective:  Patient ID: George Alvarez, male    DOB: 26-Jun-1945,   MRN: [id number]  Chief Complaint  Patient presents with   Diabetes    Cut my toenails and check them.   Saw Dr. BETHAAmon - 04/21/2024; A1c - 7.2    79 y.o. male presents for diabetic foot exam and  concern of thickened elongated and painful nails that are difficult to trim.   Relates burning and tingling in their feet. Patient is diabetic and last A1c was  Lab Results  Component Value Date   HGBA1C 7.2 (H) 01/25/2024   .   Relates concern for irritation on left leg that started several days ago. He had a couple spots frozen by dermatology and the one is fine but the one on his leg is red and painful. Has a history of MRSA. He is concerned.   PCP:  Amon Aloysius BRAVO, MD    . Denies any other pedal complaints. Denies n/v/f/c.   Past Medical History:  Diagnosis Date   Allergy    seasonal   Anal fissure    Arthritis    Asthma, chronic 06/16/2007   Qualifier: Diagnosis of  By: Tish MD, Elsie   Onset:as child Triggers (environmental, infectious, allergic): all, mainly environmental triggers Rescue inhaler ldz:mjmzob Maintenance medications/ response:Singulair ,Qvar  Smoking history:never Family history pulmonary disease: no     Basal cell carcinoma of chest wall 2016   1.8 cm, treated with electrodessication and currettage   Benign paroxysmal positional vertigo 11/19/2012   Diagnosed at [facility], [place]  [facility]  Physical therapy appointment pending    BPH (benign prostatic hyperplasia)    With urinary obstruction   Coronary artery disease    a. 02/2012 Cath/PCI: LM 10, LAD min irregs, LCX large, OM1 sm, 40 ost, OM2 95p (5.0x16 Veriflex & 4.5x12 Veriflex BMS'), RCA 30p, 37m, 30d, PDA/PLA min irregs, EF 65%   Diabetes mellitus without complication (HCC)    Diverticulosis    Erectile dysfunction due to arterial insufficiency    Fundic gland polyps of stomach, benign 2010   GERD (gastroesophageal reflux disease)     History of elevated PSA    History of kidney stones    Hyperlipidemia    Hypertension    Nocturia    Perennial allergic rhinitis    Peyronie's disease    Skin cancer    Basal and squamous cell cancers, greater than 20   Stroke (HCC) 2016   Tubular adenoma of colon 2010    Objective:  Physical Exam: Vascular: DP/PT pulses 2/4 bilateral. CFT <3 seconds. Absent hair growth on digits. Edema noted to bilateral lower extremities. Xerosis noted bilaterally.  Skin. No lacerations or abrasions bilateral feet. Nails 1-5 bilateral  are thickened discolored and elongated with subungual debris. Small area of hemorrhage from cutting himself while attempting to trim his nails.  Musculoskeletal: MMT 5/5 bilateral lower extremities in DF, PF, Inversion and Eversion. Deceased ROM in DF of ankle joint.  Neurological: Sensation intact to light touch. Protective sensation diminished bilateral.    Assessment:   1. Pain due to onychomycosis of toenails of both feet   2. Diabetes mellitus without complication (HCC)       Plan:  Patient was evaluated and treated and all questions answered. -Discussed and educated patient on diabetic foot care, especially with  regards to the vascular, neurological and musculoskeletal systems.  -Stressed the importance of good glycemic control and the detriment of not  controlling glucose levels in relation  to the foot. -Discussed supportive shoes at all times and checking feet regularly.  -Mechanically debrided all nails 1-5 bilateral using sterile nail nipper and filed with dremel without incident  -Evaluated lesion on left leg there is surrounding cellulitis. Doxycycline  sent to pharmacy and advised to put mupriocin and bandage over area for now. Follow-up with dermatologist.  -Answered all patient questions -Patient to return  in 3 months for at risk foot care -Patient advised to call the office if any problems or questions arise in the meantime.   Asberry Failing, DPM   "

## 2024-04-28 NOTE — Addendum Note (Signed)
 Addended by: Bethania Schlotzhauer R on: 04/28/2024 11:27 AM   Modules accepted: Orders, Level of Service

## 2024-05-05 ENCOUNTER — Ambulatory Visit: Admitting: Podiatry

## 2024-05-08 ENCOUNTER — Encounter: Payer: Self-pay | Admitting: Internal Medicine

## 2024-05-09 ENCOUNTER — Telehealth: Payer: Self-pay | Admitting: Internal Medicine

## 2024-05-09 ENCOUNTER — Encounter: Payer: Self-pay | Admitting: Internal Medicine

## 2024-05-09 MED ORDER — CIPROFLOXACIN HCL 500 MG PO TABS: 500.0000 mg | ORAL_TABLET | Freq: Two times a day (BID) | ORAL | Status: AC

## 2024-05-09 NOTE — Telephone Encounter (Signed)
 CT angiogram 04/2019: Normal thoracic aortic aneurysm Essentially normal renal function

## 2024-05-10 ENCOUNTER — Encounter: Payer: Self-pay | Admitting: Internal Medicine

## 2024-05-10 DIAGNOSIS — A4902 Methicillin resistant Staphylococcus aureus infection, unspecified site: Secondary | ICD-10-CM

## 2024-05-10 NOTE — Addendum Note (Signed)
 Addended by: Johnryan Sao D on: 05/10/2024 02:58 PM   Modules accepted: Orders

## 2024-06-01 ENCOUNTER — Ambulatory Visit: Admitting: Internal Medicine

## 2024-06-06 ENCOUNTER — Ambulatory Visit: Admitting: Internal Medicine

## 2024-06-16 ENCOUNTER — Ambulatory Visit

## 2024-07-25 ENCOUNTER — Ambulatory Visit: Admitting: Internal Medicine

## 2024-07-28 ENCOUNTER — Ambulatory Visit: Admitting: Podiatry

## 2025-01-26 ENCOUNTER — Ambulatory Visit
